# Patient Record
Sex: Female | Born: 1945
Health system: Southern US, Community
[De-identification: ages and names within clinical notes are randomized; demographics above are authoritative.]

## PROBLEM LIST (undated history)

## (undated) ENCOUNTER — Inpatient Hospital Stay: Admission: EM | Payer: Self-pay | Source: Home / Self Care

## (undated) DIAGNOSIS — R112 Nausea with vomiting, unspecified: Secondary | ICD-10-CM

## (undated) DIAGNOSIS — Z9889 Other specified postprocedural states: Secondary | ICD-10-CM

## (undated) DIAGNOSIS — E119 Type 2 diabetes mellitus without complications: Secondary | ICD-10-CM

## (undated) DIAGNOSIS — R5383 Other fatigue: Secondary | ICD-10-CM

## (undated) DIAGNOSIS — L821 Other seborrheic keratosis: Secondary | ICD-10-CM

## (undated) DIAGNOSIS — I2510 Atherosclerotic heart disease of native coronary artery without angina pectoris: Secondary | ICD-10-CM

## (undated) DIAGNOSIS — C4490 Unspecified malignant neoplasm of skin, unspecified: Secondary | ICD-10-CM

## (undated) DIAGNOSIS — T4145XA Adverse effect of unspecified anesthetic, initial encounter: Secondary | ICD-10-CM

## (undated) DIAGNOSIS — K589 Irritable bowel syndrome without diarrhea: Secondary | ICD-10-CM

## (undated) DIAGNOSIS — I10 Essential (primary) hypertension: Secondary | ICD-10-CM

## (undated) DIAGNOSIS — I219 Acute myocardial infarction, unspecified: Secondary | ICD-10-CM

## (undated) DIAGNOSIS — M1990 Unspecified osteoarthritis, unspecified site: Secondary | ICD-10-CM

## (undated) DIAGNOSIS — T8859XA Other complications of anesthesia, initial encounter: Secondary | ICD-10-CM

## (undated) DIAGNOSIS — K219 Gastro-esophageal reflux disease without esophagitis: Secondary | ICD-10-CM

## (undated) DIAGNOSIS — R0789 Other chest pain: Secondary | ICD-10-CM

## (undated) DIAGNOSIS — R609 Edema, unspecified: Secondary | ICD-10-CM

## (undated) DIAGNOSIS — E039 Hypothyroidism, unspecified: Secondary | ICD-10-CM

## (undated) DIAGNOSIS — E785 Hyperlipidemia, unspecified: Secondary | ICD-10-CM

## (undated) HISTORY — DX: Hypothyroidism, unspecified: E03.9

## (undated) HISTORY — PX: OTHER SURGICAL HISTORY: SHX169

## (undated) HISTORY — DX: Hyperlipidemia, unspecified: E78.5

## (undated) HISTORY — DX: Unspecified osteoarthritis, unspecified site: M19.90

## (undated) HISTORY — DX: Unspecified malignant neoplasm of skin, unspecified: C44.90

## (undated) HISTORY — DX: Other seborrheic keratosis: L82.1

## (undated) HISTORY — PX: KNEE ARTHROPLASTY: SHX992

## (undated) HISTORY — DX: Essential (primary) hypertension: I10

## (undated) HISTORY — DX: Type 2 diabetes mellitus without complications: E11.9

## (undated) HISTORY — PX: TUBAL LIGATION: SHX77

## (undated) HISTORY — DX: Other chest pain: R07.89

## (undated) HISTORY — PX: CHOLECYSTECTOMY: SHX55

## (undated) HISTORY — DX: Gastro-esophageal reflux disease without esophagitis: K21.9

## (undated) HISTORY — DX: Irritable bowel syndrome, unspecified: K58.9

## (undated) HISTORY — DX: Atherosclerotic heart disease of native coronary artery without angina pectoris: I25.10

## (undated) HISTORY — DX: Other fatigue: R53.83

## (undated) HISTORY — DX: Other specified postprocedural states: Z98.890

## (undated) HISTORY — DX: Acute myocardial infarction, unspecified: I21.9

## (undated) HISTORY — DX: Edema, unspecified: R60.9

---
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
## 1998-07-17 HISTORY — PX: BREAST EXCISIONAL BIOPSY: SUR124

## 1998-08-24 ENCOUNTER — Other Ambulatory Visit: Admission: RE | Admit: 1998-08-24 | Discharge: 1998-08-24 | Payer: Medicare Other | Admitting: Obstetrics & Gynecology

## 1998-09-02 ENCOUNTER — Encounter: Payer: Self-pay | Admitting: General Surgery

## 1998-09-02 ENCOUNTER — Encounter: Payer: Self-pay | Admitting: Family Medicine

## 1998-09-02 ENCOUNTER — Emergency Department (HOSPITAL_COMMUNITY): Admission: EM | Admit: 1998-09-02 | Discharge: 1998-09-02 | Payer: Self-pay | Admitting: Internal Medicine

## 1998-09-08 ENCOUNTER — Encounter: Payer: Self-pay | Admitting: Obstetrics & Gynecology

## 1998-09-08 ENCOUNTER — Ambulatory Visit (HOSPITAL_COMMUNITY): Admission: RE | Admit: 1998-09-08 | Discharge: 1998-09-08 | Payer: Self-pay | Admitting: Obstetrics & Gynecology

## 1998-09-09 ENCOUNTER — Ambulatory Visit (HOSPITAL_COMMUNITY): Admission: RE | Admit: 1998-09-09 | Discharge: 1998-09-09 | Payer: Self-pay | Admitting: Obstetrics & Gynecology

## 1998-11-01 ENCOUNTER — Ambulatory Visit (HOSPITAL_COMMUNITY): Admission: RE | Admit: 1998-11-01 | Discharge: 1998-11-01 | Payer: Self-pay | Admitting: *Deleted

## 1999-07-06 ENCOUNTER — Encounter (INDEPENDENT_AMBULATORY_CARE_PROVIDER_SITE_OTHER): Payer: Self-pay

## 1999-07-06 ENCOUNTER — Other Ambulatory Visit: Admission: RE | Admit: 1999-07-06 | Discharge: 1999-07-06 | Payer: Self-pay | Admitting: Gastroenterology

## 2000-03-02 ENCOUNTER — Other Ambulatory Visit: Admission: RE | Admit: 2000-03-02 | Discharge: 2000-03-02 | Payer: Self-pay | Admitting: Obstetrics & Gynecology

## 2001-03-20 ENCOUNTER — Other Ambulatory Visit: Admission: RE | Admit: 2001-03-20 | Discharge: 2001-03-20 | Payer: Self-pay | Admitting: Obstetrics & Gynecology

## 2002-04-30 ENCOUNTER — Other Ambulatory Visit: Admission: RE | Admit: 2002-04-30 | Discharge: 2002-04-30 | Payer: Self-pay | Admitting: Obstetrics & Gynecology

## 2002-06-14 ENCOUNTER — Emergency Department (HOSPITAL_COMMUNITY): Admission: EM | Admit: 2002-06-14 | Discharge: 2002-06-14 | Payer: Self-pay | Admitting: Emergency Medicine

## 2002-06-14 ENCOUNTER — Encounter: Payer: Self-pay | Admitting: Emergency Medicine

## 2002-07-02 ENCOUNTER — Encounter: Payer: Self-pay | Admitting: Pulmonary Disease

## 2002-07-02 ENCOUNTER — Ambulatory Visit (HOSPITAL_COMMUNITY): Admission: RE | Admit: 2002-07-02 | Discharge: 2002-07-02 | Payer: Self-pay | Admitting: Pulmonary Disease

## 2003-02-06 ENCOUNTER — Encounter: Payer: Self-pay | Admitting: Family Medicine

## 2003-02-06 ENCOUNTER — Other Ambulatory Visit: Admission: RE | Admit: 2003-02-06 | Discharge: 2003-02-06 | Payer: Self-pay | Admitting: Family Medicine

## 2003-02-16 ENCOUNTER — Encounter (INDEPENDENT_AMBULATORY_CARE_PROVIDER_SITE_OTHER): Payer: Self-pay | Admitting: *Deleted

## 2003-03-05 ENCOUNTER — Encounter (INDEPENDENT_AMBULATORY_CARE_PROVIDER_SITE_OTHER): Payer: Self-pay | Admitting: *Deleted

## 2004-02-08 ENCOUNTER — Other Ambulatory Visit: Admission: RE | Admit: 2004-02-08 | Discharge: 2004-02-08 | Payer: Self-pay | Admitting: Family Medicine

## 2004-02-29 ENCOUNTER — Encounter: Admission: RE | Admit: 2004-02-29 | Discharge: 2004-02-29 | Payer: Self-pay | Admitting: Family Medicine

## 2004-05-26 ENCOUNTER — Ambulatory Visit: Payer: Self-pay

## 2004-08-15 ENCOUNTER — Ambulatory Visit: Payer: Self-pay | Admitting: Family Medicine

## 2004-12-01 ENCOUNTER — Ambulatory Visit: Payer: Self-pay | Admitting: Family Medicine

## 2004-12-08 ENCOUNTER — Ambulatory Visit: Payer: Self-pay | Admitting: Family Medicine

## 2004-12-08 ENCOUNTER — Other Ambulatory Visit: Admission: RE | Admit: 2004-12-08 | Discharge: 2004-12-08 | Payer: Self-pay | Admitting: Family Medicine

## 2005-01-06 ENCOUNTER — Ambulatory Visit: Payer: Self-pay | Admitting: Family Medicine

## 2005-01-30 ENCOUNTER — Ambulatory Visit: Payer: Self-pay | Admitting: Family Medicine

## 2005-06-01 ENCOUNTER — Encounter: Admission: RE | Admit: 2005-06-01 | Discharge: 2005-06-01 | Payer: Self-pay | Admitting: Family Medicine

## 2005-06-01 ENCOUNTER — Ambulatory Visit: Payer: Self-pay | Admitting: Cardiology

## 2005-06-22 ENCOUNTER — Encounter: Admission: RE | Admit: 2005-06-22 | Discharge: 2005-06-22 | Payer: Self-pay | Admitting: Family Medicine

## 2005-08-16 ENCOUNTER — Ambulatory Visit: Payer: Self-pay | Admitting: Gastroenterology

## 2005-12-28 ENCOUNTER — Ambulatory Visit: Payer: Self-pay | Admitting: Family Medicine

## 2006-01-30 ENCOUNTER — Ambulatory Visit: Payer: Self-pay | Admitting: Family Medicine

## 2006-01-30 ENCOUNTER — Other Ambulatory Visit: Admission: RE | Admit: 2006-01-30 | Discharge: 2006-01-30 | Payer: Self-pay | Admitting: Family Medicine

## 2006-01-30 ENCOUNTER — Encounter: Payer: Self-pay | Admitting: Family Medicine

## 2006-04-19 ENCOUNTER — Ambulatory Visit: Payer: Self-pay | Admitting: Family Medicine

## 2006-05-03 ENCOUNTER — Ambulatory Visit: Payer: Self-pay | Admitting: Family Medicine

## 2006-06-12 ENCOUNTER — Ambulatory Visit: Payer: Self-pay | Admitting: Cardiology

## 2006-06-15 ENCOUNTER — Ambulatory Visit: Payer: Self-pay

## 2006-07-19 ENCOUNTER — Encounter: Admission: RE | Admit: 2006-07-19 | Discharge: 2006-07-19 | Payer: Self-pay | Admitting: Family Medicine

## 2006-09-05 ENCOUNTER — Ambulatory Visit: Payer: Self-pay | Admitting: Cardiology

## 2006-09-05 ENCOUNTER — Inpatient Hospital Stay (HOSPITAL_COMMUNITY): Admission: EM | Admit: 2006-09-05 | Discharge: 2006-09-06 | Payer: Self-pay | Admitting: Emergency Medicine

## 2006-09-11 ENCOUNTER — Emergency Department (HOSPITAL_COMMUNITY): Admission: EM | Admit: 2006-09-11 | Discharge: 2006-09-11 | Payer: Self-pay | Admitting: Emergency Medicine

## 2006-09-11 ENCOUNTER — Ambulatory Visit: Payer: Self-pay | Admitting: Family Medicine

## 2006-09-13 ENCOUNTER — Ambulatory Visit: Payer: Self-pay | Admitting: *Deleted

## 2006-09-13 LAB — CONVERTED CEMR LAB
Basophils Absolute: 0 10*3/uL (ref 0.0–0.1)
Eosinophils Absolute: 0.1 10*3/uL (ref 0.0–0.6)
MCHC: 34.3 g/dL (ref 30.0–36.0)
MCV: 92.3 fL (ref 78.0–100.0)
Platelets: 344 10*3/uL (ref 150–400)
RBC: 3.61 M/uL — ABNORMAL LOW (ref 3.87–5.11)

## 2006-09-20 ENCOUNTER — Encounter (HOSPITAL_COMMUNITY): Admission: RE | Admit: 2006-09-20 | Discharge: 2006-12-19 | Payer: Self-pay | Admitting: Cardiology

## 2006-09-21 ENCOUNTER — Ambulatory Visit: Payer: Self-pay | Admitting: Cardiology

## 2006-10-24 ENCOUNTER — Ambulatory Visit: Payer: Self-pay | Admitting: Gastroenterology

## 2006-10-24 LAB — CONVERTED CEMR LAB
Basophils Relative: 0.3 % (ref 0.0–1.0)
HCT: 41 % (ref 36.0–46.0)
Hemoglobin: 14.1 g/dL (ref 12.0–15.0)
Monocytes Absolute: 0.4 10*3/uL (ref 0.2–0.7)
Monocytes Relative: 7.5 % (ref 3.0–11.0)
RDW: 12.8 % (ref 11.5–14.6)
Saturation Ratios: 27 % (ref 20.0–50.0)
Transferrin: 243.1 mg/dL (ref >=212.0)
Vitamin B-12: 462 pg/mL (ref 211–911)

## 2006-11-06 ENCOUNTER — Ambulatory Visit: Payer: Self-pay | Admitting: Gastroenterology

## 2006-11-06 LAB — CONVERTED CEMR LAB
Fecal Occult Blood: NEGATIVE
OCCULT 2: NEGATIVE

## 2006-11-16 ENCOUNTER — Ambulatory Visit: Payer: Self-pay | Admitting: Cardiology

## 2006-12-20 ENCOUNTER — Encounter (HOSPITAL_COMMUNITY): Admission: RE | Admit: 2006-12-20 | Discharge: 2007-01-21 | Payer: Self-pay | Admitting: Cardiology

## 2007-01-14 ENCOUNTER — Ambulatory Visit: Payer: Self-pay | Admitting: Internal Medicine

## 2007-01-24 DIAGNOSIS — K219 Gastro-esophageal reflux disease without esophagitis: Secondary | ICD-10-CM

## 2007-01-24 DIAGNOSIS — Z8719 Personal history of other diseases of the digestive system: Secondary | ICD-10-CM

## 2007-01-24 DIAGNOSIS — Z860100 Personal history of colon polyps, unspecified: Secondary | ICD-10-CM | POA: Insufficient documentation

## 2007-01-24 DIAGNOSIS — M1990 Unspecified osteoarthritis, unspecified site: Secondary | ICD-10-CM

## 2007-01-24 DIAGNOSIS — I252 Old myocardial infarction: Secondary | ICD-10-CM

## 2007-01-24 DIAGNOSIS — F329 Major depressive disorder, single episode, unspecified: Secondary | ICD-10-CM

## 2007-02-06 ENCOUNTER — Ambulatory Visit: Payer: Self-pay | Admitting: Family Medicine

## 2007-02-06 LAB — CONVERTED CEMR LAB
ALT: 35 units/L (ref 0–35)
Alkaline Phosphatase: 73 units/L (ref 39–117)
BUN: 11 mg/dL (ref 6–23)
Bilirubin Urine: NEGATIVE
Bilirubin, Direct: 0.1 mg/dL (ref 0.0–0.3)
Blood in Urine, dipstick: NEGATIVE
Calcium: 9.3 mg/dL (ref 8.4–10.5)
Cholesterol: 123 mg/dL (ref 0–200)
Eosinophils Absolute: 0.1 10*3/uL (ref 0.0–0.6)
GFR calc Af Amer: 109 mL/min
GFR calc non Af Amer: 90 mL/min
Glucose, Urine, Semiquant: NEGATIVE
HDL: 37.3 mg/dL — ABNORMAL LOW (ref >39.0)
Hgb A1c MFr Bld: 6.7 % — ABNORMAL HIGH (ref 4.6–6.0)
Ketones, urine, test strip: NEGATIVE
Lymphocytes Relative: 39.3 % (ref 12.0–46.0)
MCHC: 33.8 g/dL (ref 30.0–36.0)
MCV: 89.4 fL (ref 78.0–100.0)
Microalb Creat Ratio: 3.3 mg/g (ref 0.0–30.0)
Microalb, Ur: 0.5 mg/dL (ref 0.0–1.9)
Monocytes Absolute: 0.4 10*3/uL (ref 0.2–0.7)
Monocytes Relative: 7.5 % (ref 3.0–11.0)
Neutro Abs: 2.4 10*3/uL (ref 1.4–7.7)
Nitrite: NEGATIVE
Platelets: 245 10*3/uL (ref 150–400)
Potassium: 3.8 meq/L (ref 3.5–5.1)
Specific Gravity, Urine: 1.025
Triglycerides: 98 mg/dL (ref 0–149)
pH: 5.5

## 2007-02-13 ENCOUNTER — Ambulatory Visit: Payer: Self-pay | Admitting: Family Medicine

## 2007-02-13 ENCOUNTER — Other Ambulatory Visit: Admission: RE | Admit: 2007-02-13 | Discharge: 2007-02-13 | Payer: Self-pay | Admitting: Family Medicine

## 2007-02-13 ENCOUNTER — Encounter: Payer: Self-pay | Admitting: Family Medicine

## 2007-02-14 DIAGNOSIS — Z85828 Personal history of other malignant neoplasm of skin: Secondary | ICD-10-CM | POA: Insufficient documentation

## 2007-02-14 DIAGNOSIS — H8309 Labyrinthitis, unspecified ear: Secondary | ICD-10-CM | POA: Insufficient documentation

## 2007-02-15 ENCOUNTER — Ambulatory Visit: Payer: Self-pay | Admitting: Cardiology

## 2007-04-12 ENCOUNTER — Ambulatory Visit: Payer: Self-pay

## 2007-04-16 ENCOUNTER — Ambulatory Visit: Payer: Self-pay | Admitting: Family Medicine

## 2007-04-16 DIAGNOSIS — L821 Other seborrheic keratosis: Secondary | ICD-10-CM

## 2007-04-23 ENCOUNTER — Ambulatory Visit: Payer: Self-pay | Admitting: Family Medicine

## 2007-04-23 LAB — CONVERTED CEMR LAB
Glucose, Bld: 110 mg/dL — ABNORMAL HIGH (ref 70–99)
Hgb A1c MFr Bld: 6.5 % — ABNORMAL HIGH (ref 4.6–6.0)

## 2007-04-26 ENCOUNTER — Ambulatory Visit: Payer: Self-pay | Admitting: Family Medicine

## 2007-07-23 ENCOUNTER — Encounter: Admission: RE | Admit: 2007-07-23 | Discharge: 2007-07-23 | Payer: Self-pay | Admitting: Family Medicine

## 2007-09-12 ENCOUNTER — Ambulatory Visit: Payer: Self-pay | Admitting: Cardiology

## 2007-11-15 ENCOUNTER — Ambulatory Visit: Payer: Self-pay | Admitting: Family Medicine

## 2007-11-15 DIAGNOSIS — J069 Acute upper respiratory infection, unspecified: Secondary | ICD-10-CM | POA: Insufficient documentation

## 2007-12-03 ENCOUNTER — Telehealth: Payer: Self-pay | Admitting: Family Medicine

## 2008-02-04 ENCOUNTER — Ambulatory Visit: Payer: Self-pay | Admitting: Family Medicine

## 2008-02-04 LAB — CONVERTED CEMR LAB
AST: 24 units/L (ref 0–37)
Albumin: 3.9 g/dL (ref 3.5–5.2)
Alkaline Phosphatase: 92 units/L (ref 39–117)
BUN: 12 mg/dL (ref 6–23)
Bilirubin, Direct: 0.1 mg/dL (ref 0.0–0.3)
Blood in Urine, dipstick: NEGATIVE
CO2: 27 meq/L (ref 19–32)
Chloride: 100 meq/L (ref 96–112)
Eosinophils Relative: 2.5 % (ref 0.0–5.0)
GFR calc Af Amer: 93 mL/min
Glucose, Bld: 123 mg/dL — ABNORMAL HIGH (ref 70–99)
HCT: 39.4 % (ref 36.0–46.0)
HDL: 45.4 mg/dL (ref >39.0)
Ketones, urine, test strip: NEGATIVE
LDL Cholesterol: 88 mg/dL (ref 0–99)
Lymphocytes Relative: 35 % (ref 12.0–46.0)
Monocytes Absolute: 0.4 10*3/uL (ref 0.1–1.0)
Monocytes Relative: 7.5 % (ref 3.0–12.0)
Neutrophils Relative %: 54.8 % (ref 43.0–77.0)
Nitrite: NEGATIVE
Platelets: 330 10*3/uL (ref 150–400)
Potassium: 3.9 meq/L (ref 3.5–5.1)
RDW: 12.8 % (ref 11.5–14.6)
Sodium: 136 meq/L (ref 135–145)
Specific Gravity, Urine: 1.01
Total CHOL/HDL Ratio: 3.5
Total Protein: 7 g/dL (ref 6.0–8.3)
Triglycerides: 127 mg/dL (ref 0–149)
Urobilinogen, UA: 0.2
VLDL: 25 mg/dL (ref 0–40)
WBC Urine, dipstick: NEGATIVE
WBC: 5.8 10*3/uL (ref 4.5–10.5)

## 2008-02-17 ENCOUNTER — Ambulatory Visit: Payer: Self-pay | Admitting: Family Medicine

## 2008-02-17 ENCOUNTER — Encounter: Payer: Self-pay | Admitting: Family Medicine

## 2008-02-17 ENCOUNTER — Other Ambulatory Visit: Admission: RE | Admit: 2008-02-17 | Discharge: 2008-02-17 | Payer: Self-pay | Admitting: Family Medicine

## 2008-02-18 ENCOUNTER — Telehealth: Payer: Self-pay | Admitting: Family Medicine

## 2008-02-25 ENCOUNTER — Ambulatory Visit: Payer: Self-pay | Admitting: Family Medicine

## 2008-04-10 ENCOUNTER — Telehealth: Payer: Self-pay | Admitting: *Deleted

## 2008-04-17 ENCOUNTER — Telehealth: Payer: Self-pay | Admitting: *Deleted

## 2008-06-02 ENCOUNTER — Telehealth: Payer: Self-pay | Admitting: *Deleted

## 2008-07-31 ENCOUNTER — Telehealth: Payer: Self-pay | Admitting: *Deleted

## 2008-08-03 ENCOUNTER — Encounter: Admission: RE | Admit: 2008-08-03 | Discharge: 2008-08-03 | Payer: Self-pay | Admitting: Family Medicine

## 2008-08-03 ENCOUNTER — Ambulatory Visit: Payer: Self-pay | Admitting: Cardiology

## 2008-12-03 ENCOUNTER — Telehealth: Payer: Self-pay | Admitting: Family Medicine

## 2009-02-10 ENCOUNTER — Ambulatory Visit: Payer: Self-pay | Admitting: Family Medicine

## 2009-02-10 DIAGNOSIS — J45909 Unspecified asthma, uncomplicated: Secondary | ICD-10-CM | POA: Insufficient documentation

## 2009-02-15 ENCOUNTER — Ambulatory Visit: Payer: Self-pay | Admitting: Family Medicine

## 2009-02-15 LAB — CONVERTED CEMR LAB
ALT: 51 units/L — ABNORMAL HIGH (ref 0–35)
AST: 26 units/L (ref 0–37)
Alkaline Phosphatase: 87 units/L (ref 39–117)
BUN: 24 mg/dL — ABNORMAL HIGH (ref 6–23)
Bilirubin, Direct: 0 mg/dL (ref 0.0–0.3)
Cholesterol: 176 mg/dL (ref 0–200)
Creatinine, Ser: 1 mg/dL (ref 0.4–1.2)
Eosinophils Relative: 0.8 % (ref 0.0–5.0)
GFR calc non Af Amer: 59.17 mL/min (ref >60)
LDL Cholesterol: 78 mg/dL (ref 0–99)
Monocytes Relative: 5.7 % (ref 3.0–12.0)
Neutrophils Relative %: 49.1 % (ref 43.0–77.0)
Nitrite: NEGATIVE
Platelets: 313 10*3/uL (ref 150.0–400.0)
Potassium: 4.5 meq/L (ref 3.5–5.1)
RBC: 4.9 M/uL (ref 3.87–5.11)
Total Bilirubin: 0.7 mg/dL (ref 0.3–1.2)
Total CHOL/HDL Ratio: 3
Triglycerides: 156 mg/dL — ABNORMAL HIGH (ref 0.0–149.0)
Urobilinogen, UA: 0.2
VLDL: 31.2 mg/dL (ref 0.0–40.0)
WBC: 9.6 10*3/uL (ref 4.5–10.5)

## 2009-02-23 ENCOUNTER — Encounter: Payer: Self-pay | Admitting: Family Medicine

## 2009-02-23 ENCOUNTER — Ambulatory Visit: Payer: Self-pay | Admitting: Family Medicine

## 2009-02-23 ENCOUNTER — Other Ambulatory Visit: Admission: RE | Admit: 2009-02-23 | Discharge: 2009-02-23 | Payer: Self-pay | Admitting: Family Medicine

## 2009-02-23 ENCOUNTER — Ambulatory Visit: Payer: Self-pay | Admitting: Internal Medicine

## 2009-02-23 DIAGNOSIS — E039 Hypothyroidism, unspecified: Secondary | ICD-10-CM

## 2009-02-24 ENCOUNTER — Telehealth: Payer: Self-pay | Admitting: Family Medicine

## 2009-03-10 ENCOUNTER — Encounter (INDEPENDENT_AMBULATORY_CARE_PROVIDER_SITE_OTHER): Payer: Self-pay | Admitting: *Deleted

## 2009-03-19 ENCOUNTER — Observation Stay (HOSPITAL_COMMUNITY): Admission: EM | Admit: 2009-03-19 | Discharge: 2009-03-19 | Payer: Self-pay | Admitting: Emergency Medicine

## 2009-03-19 ENCOUNTER — Ambulatory Visit: Payer: Self-pay | Admitting: Cardiovascular Disease

## 2009-04-07 ENCOUNTER — Ambulatory Visit: Payer: Self-pay | Admitting: Family Medicine

## 2009-04-09 LAB — CONVERTED CEMR LAB: TSH: 1.25 microintl units/mL (ref 0.35–5.50)

## 2009-04-13 ENCOUNTER — Ambulatory Visit: Payer: Self-pay | Admitting: Family Medicine

## 2009-04-22 ENCOUNTER — Encounter (INDEPENDENT_AMBULATORY_CARE_PROVIDER_SITE_OTHER): Payer: Self-pay | Admitting: *Deleted

## 2009-05-19 ENCOUNTER — Ambulatory Visit: Payer: Self-pay | Admitting: Cardiology

## 2009-05-19 DIAGNOSIS — R609 Edema, unspecified: Secondary | ICD-10-CM | POA: Insufficient documentation

## 2009-08-02 ENCOUNTER — Ambulatory Visit: Payer: Self-pay | Admitting: Family Medicine

## 2009-08-02 DIAGNOSIS — J45901 Unspecified asthma with (acute) exacerbation: Secondary | ICD-10-CM | POA: Insufficient documentation

## 2009-08-23 ENCOUNTER — Ambulatory Visit: Payer: Self-pay | Admitting: Family Medicine

## 2009-08-23 DIAGNOSIS — R5381 Other malaise: Secondary | ICD-10-CM

## 2009-08-23 DIAGNOSIS — R0789 Other chest pain: Secondary | ICD-10-CM

## 2009-08-23 DIAGNOSIS — R5383 Other fatigue: Secondary | ICD-10-CM

## 2009-08-24 LAB — CONVERTED CEMR LAB
ALT: 35 units/L (ref 0–35)
BUN: 9 mg/dL (ref 6–23)
CO2: 29 meq/L (ref 19–32)
Chloride: 105 meq/L (ref 96–112)
Creatinine, Ser: 0.8 mg/dL (ref 0.4–1.2)
Eosinophils Relative: 6.1 % — ABNORMAL HIGH (ref 0.0–5.0)
Glucose, Bld: 129 mg/dL — ABNORMAL HIGH (ref 70–99)
HCT: 39 % (ref 36.0–46.0)
Lymphs Abs: 1.7 10*3/uL (ref 0.7–4.0)
MCHC: 33.3 g/dL (ref 30.0–36.0)
MCV: 89.5 fL (ref 78.0–100.0)
Monocytes Absolute: 0.5 10*3/uL (ref 0.1–1.0)
Platelets: 243 10*3/uL (ref 150.0–400.0)
Total Bilirubin: 0.5 mg/dL (ref 0.3–1.2)
Total Protein: 6.4 g/dL (ref 6.0–8.3)
WBC: 5.7 10*3/uL (ref 4.5–10.5)

## 2009-08-26 ENCOUNTER — Ambulatory Visit: Payer: Self-pay | Admitting: Family Medicine

## 2009-08-26 ENCOUNTER — Encounter (INDEPENDENT_AMBULATORY_CARE_PROVIDER_SITE_OTHER): Payer: Self-pay | Admitting: *Deleted

## 2009-08-27 ENCOUNTER — Telehealth: Payer: Self-pay | Admitting: Internal Medicine

## 2009-08-30 ENCOUNTER — Ambulatory Visit: Payer: Self-pay | Admitting: Internal Medicine

## 2009-08-31 ENCOUNTER — Ambulatory Visit: Payer: Self-pay | Admitting: Internal Medicine

## 2009-08-31 LAB — CONVERTED CEMR LAB: UREASE: NEGATIVE

## 2009-09-06 ENCOUNTER — Encounter: Admission: RE | Admit: 2009-09-06 | Discharge: 2009-09-06 | Payer: Self-pay | Admitting: Family Medicine

## 2009-09-17 ENCOUNTER — Encounter: Payer: Self-pay | Admitting: Family Medicine

## 2009-09-22 DIAGNOSIS — E785 Hyperlipidemia, unspecified: Secondary | ICD-10-CM

## 2009-09-22 DIAGNOSIS — I10 Essential (primary) hypertension: Secondary | ICD-10-CM | POA: Insufficient documentation

## 2009-09-22 DIAGNOSIS — I2510 Atherosclerotic heart disease of native coronary artery without angina pectoris: Secondary | ICD-10-CM

## 2009-09-23 ENCOUNTER — Ambulatory Visit: Payer: Self-pay | Admitting: Cardiology

## 2009-10-21 ENCOUNTER — Ambulatory Visit: Payer: Self-pay | Admitting: Family Medicine

## 2009-10-21 DIAGNOSIS — J029 Acute pharyngitis, unspecified: Secondary | ICD-10-CM

## 2009-10-21 DIAGNOSIS — J1189 Influenza due to unidentified influenza virus with other manifestations: Secondary | ICD-10-CM | POA: Insufficient documentation

## 2009-10-26 ENCOUNTER — Telehealth: Payer: Self-pay | Admitting: Family Medicine

## 2009-11-22 ENCOUNTER — Ambulatory Visit: Payer: Self-pay | Admitting: Family Medicine

## 2009-11-30 ENCOUNTER — Telehealth: Payer: Self-pay | Admitting: Family Medicine

## 2009-12-17 ENCOUNTER — Telehealth: Payer: Self-pay | Admitting: *Deleted

## 2009-12-20 ENCOUNTER — Ambulatory Visit: Payer: Self-pay | Admitting: Family Medicine

## 2009-12-22 ENCOUNTER — Encounter: Payer: Self-pay | Admitting: Family Medicine

## 2010-01-28 ENCOUNTER — Telehealth: Payer: Self-pay | Admitting: Family Medicine

## 2010-02-08 ENCOUNTER — Telehealth: Payer: Self-pay | Admitting: Family Medicine

## 2010-02-28 ENCOUNTER — Inpatient Hospital Stay (HOSPITAL_COMMUNITY)
Admission: RE | Admit: 2010-02-28 | Discharge: 2010-03-03 | Payer: Self-pay | Source: Home / Self Care | Admitting: Orthopedic Surgery

## 2010-03-22 ENCOUNTER — Encounter: Admission: RE | Admit: 2010-03-22 | Discharge: 2010-06-07 | Payer: Self-pay | Admitting: Orthopedic Surgery

## 2010-04-04 ENCOUNTER — Ambulatory Visit: Payer: Self-pay | Admitting: Cardiology

## 2010-04-05 ENCOUNTER — Ambulatory Visit: Payer: Self-pay | Admitting: Family Medicine

## 2010-04-05 ENCOUNTER — Other Ambulatory Visit: Admission: RE | Admit: 2010-04-05 | Discharge: 2010-04-05 | Payer: Self-pay | Admitting: Family Medicine

## 2010-04-05 LAB — CONVERTED CEMR LAB
Blood in Urine, dipstick: NEGATIVE
Glucose, Urine, Semiquant: NEGATIVE
Specific Gravity, Urine: <1.005
WBC Urine, dipstick: NEGATIVE
pH: 5.5

## 2010-04-08 LAB — CONVERTED CEMR LAB
AST: 21 units/L (ref 0–37)
Albumin: 4 g/dL (ref 3.5–5.2)
Basophils Absolute: 0 10*3/uL (ref 0.0–0.1)
CO2: 25 meq/L (ref 19–32)
Chloride: 98 meq/L (ref 96–112)
Eosinophils Absolute: 0 10*3/uL (ref 0.0–0.7)
Glucose, Bld: 106 mg/dL — ABNORMAL HIGH (ref 70–99)
HCT: 35.8 % — ABNORMAL LOW (ref 36.0–46.0)
Hemoglobin: 12.3 g/dL (ref 12.0–15.0)
Lymphs Abs: 1.8 10*3/uL (ref 0.7–4.0)
MCHC: 34.3 g/dL (ref 30.0–36.0)
Monocytes Relative: 6.1 % (ref 3.0–12.0)
Neutro Abs: 4.2 10*3/uL (ref 1.4–7.7)
Potassium: 4.2 meq/L (ref 3.5–5.1)
RDW: 13.8 % (ref 11.5–14.6)
Sodium: 132 meq/L — ABNORMAL LOW (ref 135–145)
TSH: 1.33 microintl units/mL (ref 0.35–5.50)
Total Protein: 6.3 g/dL (ref 6.0–8.3)

## 2010-04-11 ENCOUNTER — Ambulatory Visit: Payer: Self-pay | Admitting: Family Medicine

## 2010-04-11 DIAGNOSIS — IMO0001 Reserved for inherently not codable concepts without codable children: Secondary | ICD-10-CM | POA: Insufficient documentation

## 2010-04-11 LAB — CONVERTED CEMR LAB
ALT: 30 units/L (ref 0–35)
AST: 26 units/L (ref 0–37)
Alkaline Phosphatase: 101 units/L (ref 39–117)
BUN: 14 mg/dL (ref 6–23)
Basophils Absolute: 0 10*3/uL (ref 0.0–0.1)
Bilirubin, Direct: 0.1 mg/dL (ref 0.0–0.3)
Calcium: 10.5 mg/dL (ref 8.4–10.5)
Eosinophils Relative: 0.8 % (ref 0.0–5.0)
GFR calc non Af Amer: 61.07 mL/min (ref >60)
HCT: 42.8 % (ref 36.0–46.0)
Lymphocytes Relative: 36.5 % (ref 12.0–46.0)
Lymphs Abs: 3.4 10*3/uL (ref 0.7–4.0)
Monocytes Relative: 5 % (ref 3.0–12.0)
Neutrophils Relative %: 57.2 % (ref 43.0–77.0)
Platelets: 379 10*3/uL (ref 150.0–400.0)
Potassium: 4.1 meq/L (ref 3.5–5.1)
Total Bilirubin: 0.6 mg/dL (ref 0.3–1.2)
WBC: 9.3 10*3/uL (ref 4.5–10.5)

## 2010-04-12 ENCOUNTER — Telehealth: Payer: Self-pay | Admitting: Internal Medicine

## 2010-04-13 ENCOUNTER — Ambulatory Visit: Payer: Self-pay | Admitting: Internal Medicine

## 2010-04-13 DIAGNOSIS — R110 Nausea: Secondary | ICD-10-CM

## 2010-06-15 ENCOUNTER — Telehealth: Payer: Self-pay | Admitting: Family Medicine

## 2010-06-15 ENCOUNTER — Encounter: Payer: Self-pay | Admitting: Cardiology

## 2010-08-02 ENCOUNTER — Telehealth (INDEPENDENT_AMBULATORY_CARE_PROVIDER_SITE_OTHER): Payer: Self-pay | Admitting: *Deleted

## 2010-08-03 LAB — CBC
HCT: 40.7 % (ref 36.0–46.0)
Hemoglobin: 13.4 g/dL (ref 12.0–15.0)
MCH: 29.5 pg (ref 26.0–34.0)
MCHC: 32.9 g/dL (ref 30.0–36.0)
MCV: 89.5 fL (ref 78.0–100.0)
Platelets: 271 10*3/uL (ref 150–400)
RBC: 4.55 MIL/uL (ref 3.87–5.11)
RDW: 12.8 % (ref 11.5–15.5)
WBC: 5.4 10*3/uL (ref 4.0–10.5)

## 2010-08-03 LAB — URINALYSIS, ROUTINE W REFLEX MICROSCOPIC
Bilirubin Urine: NEGATIVE
Hgb urine dipstick: NEGATIVE
Ketones, ur: NEGATIVE mg/dL
Nitrite: NEGATIVE
Protein, ur: NEGATIVE mg/dL
Specific Gravity, Urine: 1.008 (ref 1.005–1.030)
Urine Glucose, Fasting: NEGATIVE mg/dL
Urobilinogen, UA: 0.2 mg/dL (ref 0.0–1.0)
pH: 5.5 (ref 5.0–8.0)

## 2010-08-03 LAB — COMPREHENSIVE METABOLIC PANEL
ALT: 36 U/L — ABNORMAL HIGH (ref 0–35)
AST: 24 U/L (ref 0–37)
Albumin: 4.2 g/dL (ref 3.5–5.2)
Alkaline Phosphatase: 108 U/L (ref 39–117)
BUN: 18 mg/dL (ref 6–23)
CO2: 28 mEq/L (ref 19–32)
Calcium: 10.2 mg/dL (ref 8.4–10.5)
Chloride: 102 mEq/L (ref 96–112)
Creatinine, Ser: 0.8 mg/dL (ref 0.4–1.2)
GFR calc Af Amer: >60 mL/min (ref >60)
GFR calc non Af Amer: >60 mL/min (ref >60)
Glucose, Bld: 99 mg/dL (ref 70–99)
Potassium: 4.3 mEq/L (ref 3.5–5.1)
Sodium: 137 mEq/L (ref 135–145)
Total Bilirubin: 0.5 mg/dL (ref 0.3–1.2)
Total Protein: 7 g/dL (ref 6.0–8.3)

## 2010-08-03 LAB — SURGICAL PCR SCREEN
MRSA, PCR: NEGATIVE
Staphylococcus aureus: NEGATIVE

## 2010-08-03 LAB — PROTIME-INR
INR: 0.85 (ref 0.00–1.49)
Prothrombin Time: 11.8 seconds (ref 11.6–15.2)

## 2010-08-03 LAB — APTT: aPTT: 32 seconds (ref 24–37)

## 2010-08-07 ENCOUNTER — Encounter: Payer: Self-pay | Admitting: Family Medicine

## 2010-08-08 ENCOUNTER — Inpatient Hospital Stay (HOSPITAL_COMMUNITY)
Admission: RE | Admit: 2010-08-08 | Discharge: 2010-08-11 | Payer: Self-pay | Source: Home / Self Care | Attending: Orthopedic Surgery | Admitting: Orthopedic Surgery

## 2010-08-09 LAB — TYPE AND SCREEN
ABO/RH(D): A NEG
Antibody Screen: NEGATIVE

## 2010-08-10 LAB — PROTIME-INR
INR: 1.03 (ref 0.00–1.49)
Prothrombin Time: 13.7 seconds (ref 11.6–15.2)

## 2010-08-10 LAB — BASIC METABOLIC PANEL
BUN: 8 mg/dL (ref 6–23)
CO2: 23 mEq/L (ref 19–32)
CO2: 30 mEq/L (ref 19–32)
Calcium: 9.1 mg/dL (ref 8.4–10.5)
Calcium: 9 mg/dL (ref 8.4–10.5)
Chloride: 103 mEq/L (ref 96–112)
Creatinine, Ser: 0.73 mg/dL (ref 0.4–1.2)
GFR calc Af Amer: >60 mL/min (ref >60)
GFR calc non Af Amer: >60 mL/min (ref >60)
Glucose, Bld: 210 mg/dL — ABNORMAL HIGH (ref 70–99)
Glucose, Bld: 152 mg/dL — ABNORMAL HIGH (ref 70–99)
Potassium: 4.4 mEq/L (ref 3.5–5.1)
Potassium: 4.4 mEq/L (ref 3.5–5.1)
Sodium: 133 mEq/L — ABNORMAL LOW (ref 135–145)
Sodium: 141 mEq/L (ref 135–145)

## 2010-08-10 LAB — CBC
HCT: 30.7 % — ABNORMAL LOW (ref 36.0–46.0)
HCT: 29 % — ABNORMAL LOW (ref 36.0–46.0)
Hemoglobin: 10.3 g/dL — ABNORMAL LOW (ref 12.0–15.0)
Hemoglobin: 9.6 g/dL — ABNORMAL LOW (ref 12.0–15.0)
MCH: 29.3 pg (ref 26.0–34.0)
MCH: 29.3 pg (ref 26.0–34.0)
MCHC: 33.6 g/dL (ref 30.0–36.0)
MCHC: 33.1 g/dL (ref 30.0–36.0)
MCV: 87.5 fL (ref 78.0–100.0)
Platelets: 219 10*3/uL (ref 150–400)
RBC: 3.51 MIL/uL — ABNORMAL LOW (ref 3.87–5.11)
RDW: 12.7 % (ref 11.5–15.5)
WBC: 10.6 10*3/uL — ABNORMAL HIGH (ref 4.0–10.5)

## 2010-08-11 LAB — CBC
MCH: 29.5 pg (ref 26.0–34.0)
MCV: 88.7 fL (ref 78.0–100.0)
Platelets: 236 10*3/uL (ref 150–400)
RDW: 13.1 % (ref 11.5–15.5)

## 2010-08-16 NOTE — Assessment & Plan Note (Signed)
Summary: Consult-diarrhea, weight loss, nausea, abd pain   History of Present Illness Visit Type: consult  Primary GI MD: Yancey Flemings MD Primary Provider: Roderick Pee MD Requesting Provider: Roderick Pee MD Chief Complaint: Epigastric pain, diarrhea, nausea, and weight loss  History of Present Illness:   65 year old female with multiple medical problems including coronary artery disease, prior myocardial infarction, hyperlipidemia, hypertension, hypothyroidism, skin cancer, GERD, non-adenomatous colon polyps, and asthma. She presents today with an acute diarrheal illness. She was in her usual state of good health until 4 days ago when she developed abrupt diarrhea. She reports moving her bowels 20-30 times per day. Occasionally up at night. Significant nausea with decreased appetite and associated weight loss. No vomiting, fever, or bleeding. Associated abdominal cramping improved after defecation. She has been using Imodium moderately with gradual improvement and diarrhea. States that today is her best day. She does report having taken antibiotics about 6 weeks ago with her knee surgery. No association with persons having similar symptoms. No exotic travel. She did have complete colonoscopy in February 2011. This was unremarkable. Review of outside laboratories from 2 days ago, also unremarkable with normal CBC and erythrocyte sedimentation rate. Normal liver function tests. Normal electrolytes and renal function.   GI Review of Systems    Reports abdominal pain, heartburn, nausea, and  weight loss.     Location of  Abdominal pain: epigastric area. Weight loss of 8  pounds over one week .   Denies acid reflux, belching, bloating, chest pain, dysphagia with liquids, dysphagia with solids, loss of appetite, vomiting, vomiting blood, and  weight gain.      Reports diarrhea.     Denies anal fissure, black tarry stools, change in bowel habit, constipation, diverticulosis, fecal incontinence, heme  positive stool, hemorrhoids, irritable bowel syndrome, jaundice, light color stool, liver problems, rectal bleeding, and  rectal pain.    Current Medications (verified): 1)  Aspirin Ec 325 Mg Tbec (Aspirin) .... Take One Tablet By Mouth Daily(Hold) 2)  Premarin 0.625 Mg/gm  Crea (Estrogens, Conjugated) .Marland Kitchen.. 1 App. Per [employer]) 3)  Nasonex 50 Mcg/act  Susp (Mometasone Furoate) .... Prn 4)  Prilosec 20 Mg Cpdr (Omeprazole) .... Take One By Mouth Two Times A Day(On Hold) 5)  Nitrostat 0.4 Mg Subl (Nitroglycerin) .Marland Kitchen.. 1 Tablet Under Tongue At Onset of Chest Pain; You May Repeat Every 5 Minutes For Up To 3 Doses. 6)  Vitamin D 2000 Unit Tabs (Cholecalciferol) .Marland Kitchen.. 1 Tab Once Daily(On Hold) 7)  Calcium 600 Mg Tabs (Calcium) .Marland Kitchen.. 1 Tab Two Times A (On Hold) 8)  Systane 0.4-0.3 % Soln (Polyethyl Glycol-Propyl Glycol) .... Use As Directed(On Hold) 9)  Cardizem Cd 180 Mg  Cp24 (Diltiazem Hcl Coated Beads) .... One Daily 10)  Salex 6 % (Cream) Kit (Salicylic Acid-Cleanser) .... Apply To Feet As Needed 11)  Zyrtec Allergy 10 Mg Tabs (Cetirizine Hcl) .... Take One Tab At Bedtime As Needed(On Hold) 12)  Fish Oil 1200 Mg Caps (Omega-3 Fatty Acids) .... Take One Tab Two Times A Day(On Hold) 13)  Synthroid 50 Mcg Tabs (Levothyroxine Sodium) .... Take 1 Tablet By Mouth Every Morning 14)  Furosemide 20 Mg Tabs (Furosemide) .Marland Kitchen.. 1 As Needed  For Swelling 15)  Klor-Con M20 20 Meq Cr-Tabs (Potassium Chloride Crys Cr) .Marland Kitchen.. 1 As Needed With Lasix 16)  Ambien 10 Mg Tabs (Zolpidem Tartrate) .Marland Kitchen.. 1 Tab @ Bedtime As Needed(On Hold) 17)  Lipitor 80 Mg Tabs (Atorvastatin Calcium) .... Take One By Mouth  At Bedtime(On Hold) 18)  Proventil Hfa 108 (90 Base) Mcg/act Aers (Albuterol Sulfate) .... 2 Puffs Two Times A Day(On Hold) 19)  Alprazolam 0.5 Mg Tabs (Alprazolam) .... Take One Tab By Mouth Once Daily(On Hold) 20)  Mobic 15 Mg Tabs (Meloxicam) .... Take One Tab By Mouth Once Daily(On Hold) 21)  Demerol 50 Mg Tabs  (Meperidine Hcl) .... Take 1 Tablet By Mouth Four Times A Day 22)  Zofran 4 Mg Tabs (Ondansetron Hcl) .... Take 1 Tablet By Mouth Four Times A Day 23)  Tylenol 325 Mg Tabs (Acetaminophen) .... As Needed 24)  Imodium A-D 2 Mg Tabs (Loperamide Hcl) .... As Needed  Allergies (verified): 1)  ! * Ivp Dye 2)  ! Sulfa 3)  ! Bentyl 4)  ! Codeine 5)  ! Hydrocodone  Past History:  Past Medical History: Reviewed history from 09/22/2009 and no changes required. MYOCARDIAL INFARCTION, HX OF (ICD-412) CAD, NATIVE VESSEL (ICD-414.01) CHEST PAIN, ATYPICAL (ICD-786.59) HYPERLIPIDEMIA (ICD-272.4) HYPERTENSION (ICD-401.9) FATIGUE (ICD-780.79) EDEMA (ICD-782.3) UNSPECIFIED HYPOTHYROIDISM (ICD-244.9) EXTRINSIC ASTHMA, UNSPECIFIED (ICD-493.00) VIRAL URI (ICD-465.9) KERATOSIS, SEBORRHEIC NEC (ICD-702.19) LABYRINTHITIS NOS (ICD-386.30) SKIN CANCER, HX OF (ICD-V10.83) OSTEOARTHRITIS (ICD-715.90) FAMILY HISTORY OF MELANOMA (ICD-V16.8) IRRITABLE BOWEL SYNDROME, HX OF (ICD-V12.79) COLONIC POLYPS, HX OF (ICD-V12.72) GERD (ICD-530.81) DEPRESSION (ICD-311) ASTHMA NOS W/ACUTE EXACERBATION (MWN-027.25)  Past Surgical History: Reviewed history from 04/01/2010 and no changes required. Cholecystectomy Lumpectomy Tubal ligation-Bilat Cath 09/05/06 Knee Arthroplasty-Total...right..02/28/10.Marland KitchenMarland KitchenOllen Gross, MD  Family History: Family History of Melanoma Family History of Cardiovascular disorder..mother started havin heart attacks @ 73, lived into her 61's. daughter died @ 55 metastatic melanoma Family History of Breast Cancer:sister No FH of Colon Cancer:  Social History: Reviewed history from 08/30/2009 and no changes required. Occupation: Charity fundraiser Married Never Smoked Alcohol use-yes 5 per yea rare Daily Caffeine Use one pre week Illicit Drug Use - no  Review of Systems       The patient complains of arthritis/joint pain, back pain, fatigue, muscle pains/cramps, and sleeping problems.  The patient  denies allergy/sinus, anemia, anxiety-new, blood in urine, breast changes/lumps, change in vision, confusion, cough, coughing up blood, depression-new, fainting, fever, headaches-new, hearing problems, heart murmur, heart rhythm changes, itching, menstrual pain, night sweats, nosebleeds, pregnancy symptoms, shortness of breath, skin rash, sore throat, swelling of feet/legs, swollen lymph glands, thirst - excessive, urination - excessive, urination changes/pain, urine leakage, vision changes, and voice change.    Vital Signs:  Patient profile:   65 year old female Menstrual status:  postmenopausal Height:      58.75 inches Weight:      154 pounds BMI:     31.48 BSA:     1.65 Temp:     98.9 degrees F oral Pulse rate:   74 / minute Pulse rhythm:   irregular BP sitting:   132 / 86  (left arm) Cuff size:   regular  Vitals Entered By: Ok Anis CMA (April 13, 2010 2:22 PM)  Physical Exam  General:  Well developed, well nourished, no acute distress. Head:  Normocephalic and atraumatic. Eyes:  PERRLA, no icterus. Ears:  Normal auditory acuity. Mouth:  No deformity or lesions, dentition normal. Neck:  Supple; no masses or thyromegaly. Lungs:  Clear throughout to auscultation. Heart:  Regular rate and rhythm; no murmurs, rubs,  or bruits. Abdomen:  Soft, nontender and nondistended. No masses, hepatosplenomegaly or hernias noted. Normal bowel sounds. Msk:  Symmetrical with no gross deformities. Obviously if he arthritis and recent right knee replacement surgical scar. Normal posture. Pulses:  Normal pulses noted. Extremities:  No clubbing, cyanosis, edema or deformities noted. Neurologic:  Alert and  oriented x4;   Skin:  Intact without significant lesions or rashes. No jaundice. Psych:  Alert and cooperative. Normal mood and affect.   Impression & Recommendations:  Problem # 1:  DIARRHEA (ICD-787.91) the patient presents today with an acute diarrheal illness of 4 days duration.  Negative laboratories. She looks well. Nothing to suggest inflammatory diarrhea. Given the volume and associated symptoms, suspect viral syndrome. Cannot rule out C. difficile given prior antibiotic exposure, but less likely.  Plan: #1. Empiric trial of metronidazole 250 mg p.o. t.i.d. x7 days #2. Use of Imodium p.r.n. as directed #3. Bratt diet. Advance as tolerated. #4. Check followup p.r.n.  Problem # 2:  NAUSEA (ICD-787.02) nausea related to acute illness. Expect improvement with time. She has Zofran at home. Okay to use Zofran p.r.n. She also has Phenergan suppositories, though this is in practical with diarrhea.  Patient Instructions: 1)  Your prescription has been sent to your pharmacy.  2)  Start a BRAT (bananas, rice, applsauce, toast) diet and gradually increase to a regular diet.  3)  Use Imodium as needed for diarrhea.  4)  Please schedule a follow-up appointment as needed.  5)  Copy sent to : Kelle Darting, MD 6)  The medication list was reviewed and reconciled.  All changed / newly prescribed medications were explained.  A complete medication list was provided to the patient / caregiver. Prescriptions: FLAGYL 250 MG TABS (METRONIDAZOLE) one tablet by mouth three times a day  #21 x 0   Entered by:   Christie Nottingham CMA (AAMA)   Authorized by:   Hilarie Fredrickson MD   Signed by:   Christie Nottingham CMA (AAMA) on 04/13/2010   Method used:   Electronically to        [facility] [address] (retail)       [phone]       [place], [place]  [address]       Ph: [phone]       Fax: [phone]   RxID:   [id number]

## 2010-08-16 NOTE — Assessment & Plan Note (Signed)
Summary: emp---will fast///ccm   Vital Signs:  Patient profile:   65 year old female Menstrual status:  postmenopausal Height:      58.75 inches Weight:      161 pounds Temp:     97.7 degrees F oral BP sitting:   120 / 76  (left arm) Cuff size:   regular  Vitals Entered By: Kern Reap CMA Duncan Dull) (April 05, 2010 9:24 AM) CC: welcome to medicare Is Patient Diabetic? No   Primary Care Provider:  Roderick Pee MD  CC:  welcome to medicare.  History of Present Illness: Rebecca [name] is a 65 year old, married female, nonsmoker, who comes in today for evaluation.  She has a history of postmenopausal vaginal dryness for which he uses Premarin cream weekly.  For allergic rhinitis.  She uses over-the-counter Zyrtec, and steroid nasal spray p.r.n.  For hypertension.  She takes Cardizem 180 mg daily, BP 120/76.  For hypothyroidism.  She takes Synthroid 50 micrograms daily.  She was recently given Lasix by the cardiologist for some fluid retention.  Also potassium supplement.  She is not taking inhaled steroid.  She states her cough is gone.  She takes Prilosec 20 mg b.i.d. for reflux esophagitis and 80 mg of Lipitor nightly for hyperlipidemia.  She recently had a total right knee replacement in August.  She is doing well.  No complications.  Her biggest problem is sleep dysfunction.  She's tried Ambien, which helped for awhile, and then didn't help and, then she's tried alprazolam.  Advised to try the operating room if not working well, then will refer to Dr. Rolly Pancake, [place] for consultation.  She also takes Mobic 15 mg daily for osteoarthritis.  She uses one canister a Proventil over 12 to 14 month period of time for occasional asthma.  She also takes over-the-counter aspirin, calcium, vitamin D.  She gets routine eye care, dental care, BSE monthly, colonoscopy, 2009, normal, tetanus, 2003, Pneumovax 2010, flu shot, and shingles.  Shot today.  Cardiogram August 2011 normal exam  August 2011 normal  Allergies: 1)  ! * Ivp Dye 2)  ! Sulfa 3)  ! Bentyl 4)  ! Codeine 5)  ! Hydrocodone  Past History:  Past medical, surgical, family and social histories (including risk factors) reviewed, and no changes noted (except as noted below).  Past Medical History: Reviewed history from 09/22/2009 and no changes required. MYOCARDIAL INFARCTION, HX OF (ICD-412) CAD, NATIVE VESSEL (ICD-414.01) CHEST PAIN, ATYPICAL (ICD-786.59) HYPERLIPIDEMIA (ICD-272.4) HYPERTENSION (ICD-401.9) FATIGUE (ICD-780.79) EDEMA (ICD-782.3) UNSPECIFIED HYPOTHYROIDISM (ICD-244.9) EXTRINSIC ASTHMA, UNSPECIFIED (ICD-493.00) VIRAL URI (ICD-465.9) KERATOSIS, SEBORRHEIC NEC (ICD-702.19) LABYRINTHITIS NOS (ICD-386.30) SKIN CANCER, HX OF (ICD-V10.83) OSTEOARTHRITIS (ICD-715.90) FAMILY HISTORY OF MELANOMA (ICD-V16.8) IRRITABLE BOWEL SYNDROME, HX OF (ICD-V12.79) COLONIC POLYPS, HX OF (ICD-V12.72) GERD (ICD-530.81) DEPRESSION (ICD-311) ASTHMA NOS W/ACUTE EXACERBATION (ZHY-865.78)  Past Surgical History: Reviewed history from 04/01/2010 and no changes required. Cholecystectomy Lumpectomy Tubal ligation-Bilat Cath 09/05/06 Knee Arthroplasty-Total...right..02/28/10.Marland KitchenMarland KitchenOllen Gross, MD  Family History: Reviewed history from 08/30/2009 and no changes required. Family History of Melanoma Family History of Cardiovascular disorder..mother started havin heart attacks @ 51, lived into her 30's. daughter died @ 94 metastatic melanoma Family History of Breast Cancer:sister  Social History: Reviewed history from 08/30/2009 and no changes required. Occupation: Charity fundraiser Married Never Smoked Alcohol use-yes 5 per yea rare Daily Caffeine Use one pre week Illicit Drug Use - no  Review of Systems      See HPI  Physical Exam  General:  Well-developed,well-nourished,in no acute distress; alert,appropriate and cooperative throughout  examination Head:  Normocephalic and atraumatic without obvious  abnormalities. No apparent alopecia or balding. Eyes:  No corneal or conjunctival inflammation noted. EOMI. Perrla. Funduscopic exam benign, without hemorrhages, exudates or papilledema. Vision grossly normal. Ears:  External ear exam shows no significant lesions or deformities.  Otoscopic examination reveals clear canals, tympanic membranes are intact bilaterally without bulging, retraction, inflammation or discharge. Hearing is grossly normal bilaterally. Nose:  External nasal examination shows no deformity or inflammation. Nasal mucosa are pink and moist without lesions or exudates. Mouth:  Oral mucosa and oropharynx without lesions or exudates.  Teeth in good repair. Neck:  No deformities, masses, or tenderness noted. Chest Wall:  No deformities, masses, or tenderness noted. Breasts:  No mass, nodules, thickening, tenderness, bulging, retraction, inflamation, nipple discharge or skin changes noted.   Lungs:  Normal respiratory effort, chest expands symmetrically. Lungs are clear to auscultation, no crackles or wheezes. Heart:  Normal rate and regular rhythm. S1 and S2 normal without gallop, murmur, click, rub or other extra sounds. Abdomen:  Bowel sounds positive,abdomen soft and non-tender without masses, organomegaly or hernias noted. Rectal:  No external abnormalities noted. Normal sphincter tone. No rectal masses or tenderness. Genitalia:  Pelvic Exam:        External: normal female genitalia without lesions or masses        Vagina: normal without lesions or masses        Cervix: normal without lesions or masses        Adnexa: normal bimanual exam without masses or fullness        Uterus: normal by palpation        Pap smear: performed Msk:  No deformity or scoliosis noted of thoracic or lumbar spine.   Pulses:  R and L carotid,radial,femoral,dorsalis pedis and posterior tibial pulses are full and equal bilaterally Extremities:  No clubbing, cyanosis, edema, or deformity noted with  normal full range of motion of all joints.   Neurologic:  No cranial nerve deficits noted. Station and gait are normal. Plantar reflexes are down-going bilaterally. DTRs are symmetrical throughout. Sensory, motor and coordinative functions appear intact. Skin:  Intact without suspicious lesions or rashes Cervical Nodes:  No lymphadenopathy noted Axillary Nodes:  No palpable lymphadenopathy Inguinal Nodes:  No significant adenopathy Psych:  Cognition and judgment appear intact. Alert and cooperative with normal attention span and concentration. No apparent delusions, illusions, hallucinations   Impression & Recommendations:  Problem # 1:  HYPERLIPIDEMIA (ICD-272.4) Assessment Improved  Her updated medication list for this problem includes:    Lipitor 80 Mg Tabs (Atorvastatin calcium) .Marland Kitchen... Take one by mouth at bedtime  Orders: Venipuncture (16109) TLB-Lipid Panel (80061-LIPID) TLB-BMP (Basic Metabolic Panel-BMET) (80048-METABOL) TLB-CBC Platelet - w/Differential (85025-CBCD) TLB-Hepatic/Liver Function Pnl (80076-HEPATIC) TLB-TSH (Thyroid Stimulating Hormone) (84443-TSH) Welcome to Medicare, Physical (U0454) Specimen Handling (09811)  Problem # 2:  HYPERTENSION (ICD-401.9) Assessment: Improved  Her updated medication list for this problem includes:    Cardizem Cd 180 Mg Cp24 (Diltiazem hcl coated beads) ..... One daily    Furosemide 20 Mg Tabs (Furosemide) .Marland Kitchen... 1 as needed  for swelling  Orders: Venipuncture (91478) TLB-Lipid Panel (80061-LIPID) TLB-BMP (Basic Metabolic Panel-BMET) (80048-METABOL) TLB-CBC Platelet - w/Differential (85025-CBCD) TLB-Hepatic/Liver Function Pnl (80076-HEPATIC) TLB-TSH (Thyroid Stimulating Hormone) (84443-TSH) Welcome to Medicare, Physical (G9562) Urinalysis-dipstick only (Medicare patient) (13086VH) Specimen Handling (84696)  Problem # 3:  UNSPECIFIED HYPOTHYROIDISM (ICD-244.9) Assessment: Improved  Her updated medication list for this  problem includes:    Synthroid 50 Mcg Tabs (Levothyroxine sodium) .Marland KitchenMarland KitchenMarland KitchenMarland Kitchen  Take 1 tablet by mouth every morning  Orders: Venipuncture (56213) TLB-Lipid Panel (80061-LIPID) TLB-BMP (Basic Metabolic Panel-BMET) (80048-METABOL) TLB-CBC Platelet - w/Differential (85025-CBCD) TLB-Hepatic/Liver Function Pnl (80076-HEPATIC) TLB-TSH (Thyroid Stimulating Hormone) (84443-TSH) Welcome to Medicare, Physical (Y8657) Specimen Handling (84696)  Problem # 4:  EXTRINSIC ASTHMA, UNSPECIFIED (ICD-493.00) Assessment: Improved  Her updated medication list for this problem includes:    Proventil Hfa 108 (90 Base) Mcg/act Aers (Albuterol sulfate) .Marland Kitchen... 2 puffs two times a day  Problem # 5:  OSTEOARTHRITIS (ICD-715.90) Assessment: Improved  Her updated medication list for this problem includes:    Aspirin Ec 325 Mg Tbec (Aspirin) .Marland Kitchen... Take one tablet by mouth daily    Vicodin Es 7.5-750 Mg Tabs (Hydrocodone-acetaminophen) ..... Use as directed    Mobic 15 Mg Tabs (Meloxicam) .Marland Kitchen... Take one tab by mouth once daily  Orders: Venipuncture (29528) TLB-Lipid Panel (80061-LIPID) TLB-BMP (Basic Metabolic Panel-BMET) (80048-METABOL) TLB-CBC Platelet - w/Differential (85025-CBCD) TLB-Hepatic/Liver Function Pnl (80076-HEPATIC) TLB-TSH (Thyroid Stimulating Hormone) (84443-TSH) Specimen Handling (41324)  Problem # 6:  GERD (ICD-530.81) Assessment: Improved  Her updated medication list for this problem includes:    Prilosec 20 Mg Cpdr (Omeprazole) .Marland Kitchen... Take one by mouth two times a day  Orders: Venipuncture (40102) TLB-Lipid Panel (80061-LIPID) TLB-BMP (Basic Metabolic Panel-BMET) (80048-METABOL) TLB-CBC Platelet - w/Differential (85025-CBCD) TLB-Hepatic/Liver Function Pnl (80076-HEPATIC) TLB-TSH (Thyroid Stimulating Hormone) (84443-TSH) Welcome to Medicare, Physical (V2536) Specimen Handling (64403)  Complete Medication List: 1)  Aspirin Ec 325 Mg Tbec (Aspirin) .... Take one tablet by mouth daily 2)   Premarin 0.625 Mg/gm Crea (Estrogens, conjugated) .Marland Kitchen.. 1 app. per week 3)  Nasonex 50 Mcg/act Susp (Mometasone furoate) .... Prn 4)  Prilosec 20 Mg Cpdr (Omeprazole) .... Take one by mouth two times a day 5)  Nitrostat 0.4 Mg Subl (Nitroglycerin) .Marland Kitchen.. 1 tablet under tongue at onset of chest pain; you may repeat every 5 minutes for up to 3 doses. 6)  Vitamin D 2000 Unit Tabs (Cholecalciferol) .Marland Kitchen.. 1 tab once daily 7)  Calcium 600 Mg Tabs (Calcium) .Marland Kitchen.. 1 tab two times a day 8)  Systane 0.4-0.3 % Soln (Polyethyl glycol-propyl glycol) .... Use as directed 9)  Cardizem Cd 180 Mg Cp24 (Diltiazem hcl coated beads) .... One daily 10)  Salex 6 % (cream) Kit (Salicylic acid-cleanser) .... Apply to feet as needed 11)  Zyrtec Allergy 10 Mg Tabs (Cetirizine hcl) .... Take one tab at bedtime as needed 12)  Fish Oil 1200 Mg Caps (Omega-3 fatty acids) .... Take one tab two times a day 13)  Synthroid 50 Mcg Tabs (Levothyroxine sodium) .... Take 1 tablet by mouth every morning 14)  Furosemide 20 Mg Tabs (Furosemide) .Marland Kitchen.. 1 as needed  for swelling 15)  Klor-con M20 20 Meq Cr-tabs (Potassium chloride crys cr) .Marland Kitchen.. 1 as needed with lasix 16)  Ambien 10 Mg Tabs (Zolpidem tartrate) .Marland Kitchen.. 1 tab @ bedtime as needed 17)  Lipitor 80 Mg Tabs (Atorvastatin calcium) .... Take one by mouth at bedtime 18)  Vicodin Es 7.5-750 Mg Tabs (Hydrocodone-acetaminophen) .... Use as directed 19)  Proventil Hfa 108 (90 Base) Mcg/act Aers (Albuterol sulfate) .... 2 puffs two times a day 20)  Benzonatate 100 Mg Caps (Benzonatate) .... Take one tab by mouth four times a day 21)  Alprazolam 0.5 Mg Tabs (Alprazolam) .... Take one tab by mouth once daily 22)  Mobic 15 Mg Tabs (Meloxicam) .... Take one tab by mouth once daily  Other Orders: Zoster (Shingles) Vaccine Live [phone]) Admin 1st Vaccine (95638)  Patient  Instructions: 1)  Please schedule a follow-up appointment in 1 year. 2)  It is important that you exercise regularly at least  20 minutes 5 times a week. If you develop chest pain, have severe difficulty breathing, or feel very tired , stop exercising immediately and seek medical attention. 3)  Schedule your mammogram. 4)  Take an Aspirin every day. 5)  If the alprazolam, does not resolve the sleep  dysfunction.  I would recommend a consult with Dr. Rolly Pancake, [place] Prescriptions: MOBIC 15 MG TABS (MELOXICAM) take one tab by mouth once daily  #100 x 3   Entered and Authorized by:   Roderick Pee MD   Signed by:   Roderick Pee MD on 04/05/2010   Method used:   Print then Give to Patient   RxID:   [id number] ALPRAZOLAM 0.5 MG TABS (ALPRAZOLAM) take one tab by mouth once daily  #100 x 3   Entered and Authorized by:   Roderick Pee MD   Signed by:   Roderick Pee MD on 04/05/2010   Method used:   Print then Give to Patient   RxID:   [id number] PROVENTIL HFA 108 (90 BASE) MCG/ACT AERS (ALBUTEROL SULFATE) 2 puffs two times a day  #1 x 1   Entered and Authorized by:   Roderick Pee MD   Signed by:   Roderick Pee MD on 04/05/2010   Method used:   Print then Give to Patient   RxID:   [id number] LIPITOR 80 MG TABS (ATORVASTATIN CALCIUM) take one by mouth at bedtime  #100 x 3   Entered and Authorized by:   Roderick Pee MD   Signed by:   Roderick Pee MD on 04/05/2010   Method used:   Print then Give to Patient   RxID:   [id number] KLOR-CON M20 20 MEQ CR-TABS (POTASSIUM CHLORIDE CRYS CR) 1 AS NEEDED WITH LASIX  #100 x 3   Entered and Authorized by:   Roderick Pee MD   Signed by:   Roderick Pee MD on 04/05/2010   Method used:   Print then Give to Patient   RxID:   [id number] FUROSEMIDE 20 MG TABS (FUROSEMIDE) 1 AS NEEDED  FOR SWELLING  #100 x 3   Entered and Authorized by:   Roderick Pee MD   Signed by:   Roderick Pee MD on 04/05/2010   Method used:   Print then Give to Patient   RxID:   [id number] SYNTHROID 50 MCG TABS (LEVOTHYROXINE SODIUM) Take 1 tablet by mouth  every morning  #100 x 3   Entered and Authorized by:   Roderick Pee MD   Signed by:   Roderick Pee MD on 04/05/2010   Method used:   Print then Give to Patient   RxID:   [id number] CARDIZEM CD 180 MG  CP24 (DILTIAZEM HCL COATED BEADS) one daily  #100 x 3   Entered and Authorized by:   Roderick Pee MD   Signed by:   Roderick Pee MD on 04/05/2010   Method used:   Print then Give to Patient   RxID:   [id number] NITROSTAT 0.4 MG SUBL (NITROGLYCERIN) 1 tablet under tongue at onset of chest pain; you may repeat every 5 minutes for up to 3 doses.  #25 x 1   Entered and Authorized by:   Roderick Pee MD   Signed by:   Roderick Pee MD on 04/05/2010  Method used:   Print then Give to Patient   RxID:   [id number] NASONEX 50 MCG/ACT  SUSP (MOMETASONE FUROATE) prn  #3 x 4   Entered and Authorized by:   Roderick Pee MD   Signed by:   Roderick Pee MD on 04/05/2010   Method used:   Print then Give to Patient   RxID:   [id number] PREMARIN 0.625 MG/GM  CREA (ESTROGENS, CONJUGATED) 1 app. per week  #3 tubes x 4   Entered and Authorized by:   Roderick Pee MD   Signed by:   Roderick Pee MD on 04/05/2010   Method used:   Print then Give to Patient   RxID:   [id number]    Immunizations Administered:  Zostavax # 1:    Vaccine Type: Zostavax    Site: right deltoid    Mfr: Merck    Dose: 0.65    Route: [place]    Given by: Kathrynn Speed CMA    Exp. Date: 03/04/2011    Lot #: [id number]    VIS given: 04/28/05 given April 05, 2010.    Physician counseled: yes    Eye Exam  Visual Fields     OD: normal right     OS: normal left  Laboratory Results   Urine Tests  Date/Time Recieved: April 05, 2010 11:16 AM  Date/Time Reported: April 05, 2010 11:16 AM   Routine Urinalysis   Color: yellow Appearance: Clear Glucose: negative   (Normal Range: Negative) Bilirubin: negative   (Normal Range: Negative) Ketone: negative   (Normal Range:  Negative) Spec. Gravity: <1.005   (Normal Range: 1.003-1.035) Blood: negative   (Normal Range: Negative) pH: 5.5   (Normal Range: 5.0-8.0) Protein: negative   (Normal Range: Negative) Urobilinogen: 0.2   (Normal Range: 0-1) Nitrite: negative   (Normal Range: Negative) Leukocyte Esterace: negative   (Normal Range: Negative)    Comments: Wynona Canes, CMA  April 05, 2010 11:16 AM

## 2010-08-16 NOTE — Assessment & Plan Note (Signed)
Summary: flare up IBS/NJR   Vital Signs:  Patient profile:   65 year old female Menstrual status:  postmenopausal Weight:      155 pounds Temp:     97.5 degrees F oral BP sitting:   140 / 90  (right arm)  Vitals Entered By: Kathrynn Speed CMA (April 11, 2010 3:15 PM) CC: IBS fare up x 3 days, with numbness & tingling x 1 week, src Is Patient Diabetic? No   Primary Care Provider:  Roderick Pee MD  CC:  IBS fare up x 3 days, with numbness & tingling x 1 week, and src.  History of Present Illness: Tytionna is a 65 year old, married female, nonsmoker, who comes in today for evaluation of a set of symptoms that have actually been going on for about two to 3 years.  She describes it as diffuse  muscle pain, numbness, Tingling that over the past week.  Has  got much worse.  She feels, like she's going to crawl out of her skin.  It's always worse at night.  Six weeks ago.  She had her right knee replaced.  The surgery went well.  No side effects.  No chest pain no shortness of breath, etc..  Recently, she stopped all her medications, except Imodium, as she's had diarrhea for 3 days, diltiazem, and Mylanta.  Review of systems otherwise negative  Preventive Screening-Counseling & Management  Alcohol-Tobacco     Smoking Status: never  Current Medications (verified): 1)  Aspirin Ec 325 Mg Tbec (Aspirin) .... Take One Tablet By Mouth Daily 2)  Premarin 0.625 Mg/gm  Crea (Estrogens, Conjugated) .Marland Kitchen.. 1 App. Per Week 3)  Nasonex 50 Mcg/act  Susp (Mometasone Furoate) .... Prn 4)  Prilosec 20 Mg Cpdr (Omeprazole) .... Take One By Mouth Two Times A Day 5)  Nitrostat 0.4 Mg Subl (Nitroglycerin) .Marland Kitchen.. 1 Tablet Under Tongue At Onset of Chest Pain; You May Repeat Every 5 Minutes For Up To 3 Doses. 6)  Vitamin D 2000 Unit Tabs (Cholecalciferol) .Marland Kitchen.. 1 Tab Once Daily 7)  Calcium 600 Mg Tabs (Calcium) .Marland Kitchen.. 1 Tab Two Times A Day 8)  Systane 0.4-0.3 % Soln (Polyethyl Glycol-Propyl Glycol) .... Use As  Directed 9)  Cardizem Cd 180 Mg  Cp24 (Diltiazem Hcl Coated Beads) .... One Daily 10)  Salex 6 % (Cream) Kit (Salicylic Acid-Cleanser) .... Apply To Feet As Needed 11)  Zyrtec Allergy 10 Mg Tabs (Cetirizine Hcl) .... Take One Tab At Bedtime As Needed 12)  Fish Oil 1200 Mg Caps (Omega-3 Fatty Acids) .... Take One Tab Two Times A Day 13)  Synthroid 50 Mcg Tabs (Levothyroxine Sodium) .... Take 1 Tablet By Mouth Every Morning 14)  Furosemide 20 Mg Tabs (Furosemide) .Marland Kitchen.. 1 As Needed  For Swelling 15)  Klor-Con M20 20 Meq Cr-Tabs (Potassium Chloride Crys Cr) .Marland Kitchen.. 1 As Needed With Lasix 16)  Ambien 10 Mg Tabs (Zolpidem Tartrate) .Marland Kitchen.. 1 Tab @ Bedtime As Needed 17)  Lipitor 80 Mg Tabs (Atorvastatin Calcium) .... Take One By Mouth At Bedtime 18)  Vicodin Es 7.5-750 Mg Tabs (Hydrocodone-Acetaminophen) .... Use As Directed 19)  Proventil Hfa 108 (90 Base) Mcg/act Aers (Albuterol Sulfate) .... 2 Puffs Two Times A Day 20)  Benzonatate 100 Mg Caps (Benzonatate) .... Take One Tab By Mouth Four Times A Day 21)  Alprazolam 0.5 Mg Tabs (Alprazolam) .... Take One Tab By Mouth Once Daily 22)  Mobic 15 Mg Tabs (Meloxicam) .... Take One Tab By Mouth Once Daily  Allergies (  verified): 1)  ! * Ivp Dye 2)  ! Sulfa 3)  ! Bentyl 4)  ! Codeine 5)  ! Hydrocodone  Past History:  Past medical, surgical, family and social histories (including risk factors) reviewed, and no changes noted (except as noted below).  Past Medical History: Reviewed history from 09/22/2009 and no changes required. MYOCARDIAL INFARCTION, HX OF (ICD-412) CAD, NATIVE VESSEL (ICD-414.01) CHEST PAIN, ATYPICAL (ICD-786.59) HYPERLIPIDEMIA (ICD-272.4) HYPERTENSION (ICD-401.9) FATIGUE (ICD-780.79) EDEMA (ICD-782.3) UNSPECIFIED HYPOTHYROIDISM (ICD-244.9) EXTRINSIC ASTHMA, UNSPECIFIED (ICD-493.00) VIRAL URI (ICD-465.9) KERATOSIS, SEBORRHEIC NEC (ICD-702.19) LABYRINTHITIS NOS (ICD-386.30) SKIN CANCER, HX OF (ICD-V10.83) OSTEOARTHRITIS  (ICD-715.90) FAMILY HISTORY OF MELANOMA (ICD-V16.8) IRRITABLE BOWEL SYNDROME, HX OF (ICD-V12.79) COLONIC POLYPS, HX OF (ICD-V12.72) GERD (ICD-530.81) DEPRESSION (ICD-311) ASTHMA NOS W/ACUTE EXACERBATION (WUJ-811.91)  Past Surgical History: Reviewed history from 04/01/2010 and no changes required. Cholecystectomy Lumpectomy Tubal ligation-Bilat Cath 09/05/06 Knee Arthroplasty-Total...right..02/28/10.Marland KitchenMarland KitchenOllen Gross, MD  Family History: Reviewed history from 08/30/2009 and no changes required. Family History of Melanoma Family History of Cardiovascular disorder..mother started havin heart attacks @ 39, lived into her 78's. daughter died @ 69 metastatic melanoma Family History of Breast Cancer:sister  Social History: Reviewed history from 08/30/2009 and no changes required. Occupation: Charity fundraiser Married Never Smoked Alcohol use-yes 5 per yea rare Daily Caffeine Use one pre week Illicit Drug Use - no  Review of Systems      See HPI  Physical Exam  General:  Well-developed,well-nourished,in no acute distress; alert,appropriate and cooperative throughout examination Lungs:  Normal respiratory effort, chest expands symmetrically. Lungs are clear to auscultation, no crackles or wheezes. Heart:  Normal rate and regular rhythm. S1 and S2 normal without gallop, murmur, click, rub or other extra sounds. Abdomen:  Bowel sounds positive,abdomen soft and non-tender without masses, organomegaly or hernias noted. Msk:  No deformity or scoliosis noted of thoracic or lumbar spine.   Pulses:  R and L carotid,radial,femoral,dorsalis pedis and posterior tibial pulses are full and equal bilaterally Extremities:  No clubbing, cyanosis, edema, or deformity noted with normal full range of motion of all joints.   Neurologic:  No cranial nerve deficits noted. Station and gait are normal. Plantar reflexes are down-going bilaterally. DTRs are symmetrical throughout. Sensory, motor and coordinative functions  appear intact.   Problems:  Medical Problems Added: 1)  Dx of Diarrhea  (ICD-787.91) 2)  Dx of Unspecified Myalgia and Myositis  (ICD-729.1)  Impression & Recommendations:  Problem # 1:  UNSPECIFIED MYALGIA AND MYOSITIS (ICD-729.1) Assessment New  Her updated medication list for this problem includes:    Aspirin Ec 325 Mg Tbec (Aspirin) .Marland Kitchen... Take one tablet by mouth daily    Vicodin Es 7.5-750 Mg Tabs (Hydrocodone-acetaminophen) ..... Use as directed    Mobic 15 Mg Tabs (Meloxicam) .Marland Kitchen... Take one tab by mouth once daily    Demerol 50 Mg Tabs (Meperidine hcl) .Marland Kitchen... Take 1 tablet by mouth four times a day  Orders: Venipuncture (47829) TLB-BMP (Basic Metabolic Panel-BMET) (80048-METABOL) TLB-CBC Platelet - w/Differential (85025-CBCD) TLB-Hepatic/Liver Function Pnl (80076-HEPATIC) TLB-CK Total Only(Creatine Kinase/CPK) (82550-CK) TLB-Sedimentation Rate (ESR) (85652-ESR) T-Antinuclear Antib (ANA) (56213-08657) Specimen Handling (84696)  Problem # 2:  DIARRHEA (ICD-787.91) Assessment: New  Orders: Venipuncture (29528) TLB-BMP (Basic Metabolic Panel-BMET) (80048-METABOL) TLB-CBC Platelet - w/Differential (85025-CBCD) TLB-Hepatic/Liver Function Pnl (80076-HEPATIC) TLB-CK Total Only(Creatine Kinase/CPK) (82550-CK) TLB-Sedimentation Rate (ESR) (85652-ESR) T-Antinuclear Antib (ANA) (41324-40102) Specimen Handling (72536)  Complete Medication List: 1)  Aspirin Ec 325 Mg Tbec (Aspirin) .... Take one tablet by mouth daily 2)  Premarin 0.625 Mg/gm Crea (Estrogens, conjugated) .Marland KitchenMarland KitchenMarland Kitchen 1  app. per week 3)  Nasonex 50 Mcg/act Susp (Mometasone furoate) .... Prn 4)  Prilosec 20 Mg Cpdr (Omeprazole) .... Take one by mouth two times a day 5)  Nitrostat 0.4 Mg Subl (Nitroglycerin) .Marland Kitchen.. 1 tablet under tongue at onset of chest pain; you may repeat every 5 minutes for up to 3 doses. 6)  Vitamin D 2000 Unit Tabs (Cholecalciferol) .Marland Kitchen.. 1 tab once daily 7)  Calcium 600 Mg Tabs (Calcium) .Marland Kitchen.. 1 tab  two times a day 8)  Systane 0.4-0.3 % Soln (Polyethyl glycol-propyl glycol) .... Use as directed 9)  Cardizem Cd 180 Mg Cp24 (Diltiazem hcl coated beads) .... One daily 10)  Salex 6 % (cream) Kit (Salicylic acid-cleanser) .... Apply to feet as needed 11)  Zyrtec Allergy 10 Mg Tabs (Cetirizine hcl) .... Take one tab at bedtime as needed 12)  Fish Oil 1200 Mg Caps (Omega-3 fatty acids) .... Take one tab two times a day 13)  Synthroid 50 Mcg Tabs (Levothyroxine sodium) .... Take 1 tablet by mouth every morning 14)  Furosemide 20 Mg Tabs (Furosemide) .Marland Kitchen.. 1 as needed  for swelling 15)  Klor-con M20 20 Meq Cr-tabs (Potassium chloride crys cr) .Marland Kitchen.. 1 as needed with lasix 16)  Ambien 10 Mg Tabs (Zolpidem tartrate) .Marland Kitchen.. 1 tab @ bedtime as needed 17)  Lipitor 80 Mg Tabs (Atorvastatin calcium) .... Take one by mouth at bedtime 18)  Vicodin Es 7.5-750 Mg Tabs (Hydrocodone-acetaminophen) .... Use as directed 19)  Proventil Hfa 108 (90 Base) Mcg/act Aers (Albuterol sulfate) .... 2 puffs two times a day 20)  Benzonatate 100 Mg Caps (Benzonatate) .... Take one tab by mouth four times a day 21)  Alprazolam 0.5 Mg Tabs (Alprazolam) .... Take one tab by mouth once daily 22)  Mobic 15 Mg Tabs (Meloxicam) .... Take one tab by mouth once daily 23)  Demerol 50 Mg Tabs (Meperidine hcl) .... Take 1 tablet by mouth four times a day 24)  Zofran 4 Mg Tabs (Ondansetron hcl) .... Take 1 tablet by mouth four times a day  Patient Instructions: 1)  we will get your blood work today, I will call you if soon as I get the reports in the morning. 2)  In the meantime, because the diarrhea go on a clear liquid diet.  Continue the Cardizem and euthyroid but hold all your other medications. 3)  Let's try Demerol 50 mg, along with 4 mg of Zofran every 4 to 6 hours to help relieve his symptoms until we can find the cause. 4)  If your symptoms get worse we can always put u  in the hospital for further evaluation Prescriptions: ZOFRAN  4 MG TABS (ONDANSETRON HCL) Take 1 tablet by mouth four times a day  #30 x 0   Entered and Authorized by:   Roderick Pee MD   Signed by:   Roderick Pee MD on 04/11/2010   Method used:   Print then Give to Patient   RxID:   [id number] DEMEROL 50 MG TABS (MEPERIDINE HCL) Take 1 tablet by mouth four times a day  #30 x 0   Entered and Authorized by:   Roderick Pee MD   Signed by:   Roderick Pee MD on 04/11/2010   Method used:   Print then Give to Patient   RxID:   [id number]

## 2010-08-16 NOTE — Progress Notes (Signed)
Summary: triage- nausea weight loss abd pain  Phone Note Call from Patient Call back at [facility] Phone [phone]   Caller: Patient Call For: Dr. Marina Goodell Reason for Call: Talk to Nurse Summary of Call: weight loss, diarrhea, nausea, abd pain... would like to be seen this week... was advised to see dr. Marina Goodell asap by Dr. Tawanna Cooler per pt Initial call taken by: Vallarie Mare,  April 12, 2010 12:03 PM  Follow-up for Phone Call        pt has lost 6 pounds in the last 4 days and has abd pain with nausea and diarrhea.  Cx for tomorrow and was added. Follow-up by: Chales Abrahams CMA Duncan Dull),  April 12, 2010 1:40 PM

## 2010-08-16 NOTE — Assessment & Plan Note (Signed)
Summary: 3 day rov/njr   Vital Signs:  Patient profile:   65 year old female Menstrual status:  postmenopausal Weight:      163 pounds Temp:     98.3 degrees F oral BP sitting:   160 / 84  (left arm) Cuff size:   regular  Vitals Entered By: Kern Reap CMA Duncan Dull) (August 26, 2009 1:46 PM) Comments follow up office visit   Primary Care Provider:  Roderick Pee MD   History of Present Illness: Rebecca [name] is a 65 year old, married female, retired Engineer, civil (consulting) nonsmoker, who comes in today for follow-up of coughing.  We saw her 4 days ago with a flare of her cough.  She has a history of underlying allergic rhinitis and asthma.  At that time, we felt that her asthma has flared up and restarted her Qvar one to b.i.d..  She comes back today saying she is no better.  In the meantime, she is having symptoms of reflux.  She is having burning in her esophagus.  The other night.  She took some medicine in and laid down and had a coughing spell for about 3 hours.  She is not sure if the pill got stuck in her esophagus.  She had upper endoscopy years ago by Dr. Doreatha Martin.  At that time.  She has some erythematous areas.  A small hilar hernia, but no strictures, however, that was many years ago.  Allergies: 1)  ! * Ivp Dye 2)  ! Sulfa 3)  ! Bentyl  Review of Systems      See HPI  Physical Exam  General:  Well-developed,well-nourished,in no acute distress; alert,appropriate and cooperative throughout examination Lungs:  Normal respiratory effort, chest expands symmetrically. Lungs are clear to auscultation, no crackles or wheezes.   Impression & Recommendations:  Problem # 1:  CHEST PAIN, ATYPICAL (ICD-786.59) Assessment Unchanged  Orders: Gastroenterology Referral (GI)  Problem # 2:  ASTHMA NOS W/ACUTE EXACERBATION (ICD-493.92) Assessment: Improved  The following medications were removed from the medication list:    Proventil Hfa 108 (90 Base) Mcg/act Aers (Albuterol sulfate) .Marland Kitchen... 2 ps  three times a day Her updated medication list for this problem includes:    Prednisone 20 Mg Tabs (Prednisone) ..... Uad    Qvar 40 Mcg/act Aers (Beclomethasone dipropionate) .Marland Kitchen... 1 puff two times a day  Complete Medication List: 1)  Aspirin Ec 325 Mg Tbec (Aspirin) .... Take one tablet by mouth daily 2)  Premarin 0.625 Mg/gm Crea (Estrogens, conjugated) .Marland Kitchen.. 1 app. per week 3)  Nasonex 50 Mcg/act Susp (Mometasone furoate) .... Prn 4)  Nexium 40 Mg Pack (Esomeprazole magnesium) .... Once daily 5)  Nitroquick 0.4 Mg Subl (Nitroglycerin) .... As needed 6)  Vitamin D 2000 Unit Tabs (Cholecalciferol) .Marland Kitchen.. 1 tab once daily 7)  Calcium 600 Mg Tabs (Calcium) .Marland Kitchen.. 1 tab two times a day 8)  Systane 0.4-0.3 % Soln (Polyethyl glycol-propyl glycol) .... Use as directed 9)  Cardizem Cd 180 Mg Cp24 (Diltiazem hcl coated beads) .... One daily 10)  Meclizine Hcl 25 Mg Tabs (Meclizine hcl) .... Take one tab as needed 11)  Salex 6 % (cream) Kit (Salicylic acid-cleanser) .... Apply to feet as needed 12)  Zyrtec Allergy 10 Mg Tabs (Cetirizine hcl) .... Take one tab at bedtime as needed 13)  Fish Oil 1200 Mg Caps (Omega-3 fatty acids) .... Take one tab two times a day 14)  Synthroid 50 Mcg Tabs (Levothyroxine sodium) .... Take 1 tablet by mouth every morning 15)  Furosemide 20 Mg Tabs (Furosemide) .Marland Kitchen.. 1 as needed  for swelling 16)  Klor-con M20 20 Meq Cr-tabs (Potassium chloride crys cr) .Marland Kitchen.. 1 as needed with lasix 17)  Prednisone 20 Mg Tabs (Prednisone) .... Uad 18)  Ambien 10 Mg Tabs (Zolpidem tartrate) .Marland Kitchen.. 1 tab @ bedtime as needed 19)  Qvar 40 Mcg/act Aers (Beclomethasone dipropionate) .Marland Kitchen.. 1 puff two times a day  Patient Instructions: 1)  do the anti-reflux measures that we outlined.  Also taken OTC Prilosec 20 mg a for breakfast and 20 mg before your evening meal.  Increase the Qvar two puffs twice a day. 2)  I will put in a request for you to see Dr. Leone [name] for GI consult.

## 2010-08-16 NOTE — Progress Notes (Signed)
Summary: Epigastric Pain  Phone Note Call from Patient Call back at Home Phone [phone]   Call For: Dr Leone Payor Reason for Call: Talk to Nurse Summary of Call: Alot of coughing, epigastric pain going thru her back. No energy.  Scheduled an appt for next available on 09-23-09 but feeles that she needs to be seen next week. Initial call taken by: Leanor Kail [facility],  August 27, 2009 8:30 AM  Follow-up for Phone Call        Patient  wants to be seen next week.  Patient  is rescheduled to see Willette Cluster RNP for 08-30-09 Follow-up by: Darcey Nora RN, CGRN,  August 27, 2009 9:13 AM

## 2010-08-16 NOTE — Assessment & Plan Note (Signed)
Summary: 2nd hep a and b inj/cjr  Nurse Visit   Allergies: 1)  ! * Ivp Dye 2)  ! Sulfa 3)  ! Bentyl 4)  ! Codeine 5)  ! Hydrocodone  Immunizations Administered:  TwinRix # 2:    Vaccine Type: TwinRix    Site: right deltoid    Mfr: GlaxoSmithKline    Dose: 1.0 ml    Route: IM    Given by: Kathrynn Speed CMA    Exp. Date: 10/07/2011    Lot #: [id number]    VIS given: 04/04/07 version given December 20, 2009.    Physician counseled: yes  Orders Added: 1)  TwinRix 1ml ( Hep A&B Adult dose) [90636] 2)  Admin 1st Vaccine [phone]

## 2010-08-16 NOTE — Procedures (Signed)
Summary: EGD   EGD  Procedure date:  03/05/2003  Findings:      Location: [facility]   Patient Name: Rebecca [name], Rebecca [name] MRN:  Procedure Procedures: Panendoscopy (EGD) CPT: 43235.  Personnel: Endoscopist: Ulyess Mort, MD.  Referred By: Eugenio Hoes Tawanna Cooler, MD.  Exam Location: Exam performed in Outpatient Clinic. Outpatient  Patient Consent: Procedure, Alternatives, Risks and Benefits discussed, consent obtained, from patient. Consent was obtained by the RN.  Indications Symptoms: Pulmonary symptoms, including:  Asthma. Reflux symptoms  History  Pre-Exam Physical: Performed Mar 05, 2003  Entire physical exam was normal.  Exam Exam Info: Maximum depth of insertion Duodenum, intended Duodenum. Vocal cords visualized. Gastric retroflexion performed. Images were not taken. ASA Classification: II. Tolerance: good.  Sedation Meds: Patient assessed and found to be appropriate for moderate (conscious) sedation. Fentanyl 50 mcg. given IV. Versed 5 mg. given IV.  Monitoring: BP and pulse monitoring done. Oximetry used. Supplemental O2 given  Findings - HIATAL HERNIA: 2 cms. in length. ICD9: Hernia, Hiatal: 553.3. - MUCOSAL ABNORMALITY: Duodenal Bulb to Duodenal 2nd Portion. Granular mucosa.  - MUCOSAL ABNORMALITY: Body to Antrum. Granular mucosa.   Assessment Abnormal examination, see findings above.  Diagnoses: 553.3: Hernia, Hiatal.   Events  Unplanned Intervention: No unplanned interventions were required.  Unplanned Events: There were no complications. Plans Medication(s): Continue current medications. PPI:   Patient Education: Patient given standard instructions for: Hiatal Hernia. Reflux.  Disposition: After procedure patient sent to recovery. After recovery patient sent home.    CC: Roderick Pee, MD  This report was created from the original endoscopy report, which was reviewed and signed by the above listed endoscopist.

## 2010-08-16 NOTE — Letter (Signed)
Summary: New Patient letter  [facility]  [address] [place], [place] [address]   Phone: [phone]  Fax: [phone]       08/26/2009 MRN: [id number]  [facility] [address] [place], [place]  [address]  Dear Ms. Goodness,  Welcome to the Gastroenterology Division at [facility].    You are scheduled to see Dr.  Leone Payor on 09-23-09 at 2pm on the 3rd floor at [facility], [address].  We ask that you try to arrive at our office 15 minutes prior to your appointment time to allow for check-in.  We would like you to complete the enclosed self-administered evaluation form prior to your visit and bring it with you on the day of your appointment.  We will review it with you.  Also, please bring a complete list of all your medications or, if you prefer, bring the medication bottles and we will list them.  Please bring your insurance card so that we may make a copy of it.  If your insurance requires a referral to see a specialist, please bring your referral form from your primary care physician.  Co-payments are due at the time of your visit and may be paid by cash, check or credit card.     Your office visit will consist of a consult with your physician (includes a physical exam), any laboratory testing he/she may order, scheduling of any necessary diagnostic testing (e.g. x-ray, ultrasound, CT-scan), and scheduling of a procedure (e.g. Endoscopy, Colonoscopy) if required.  Please allow enough time on your schedule to allow for any/all of these possibilities.    If you cannot keep your appointment, please call [phone] to cancel or reschedule prior to your appointment date.  This allows [place] the opportunity to schedule an appointment for another patient in need of care.  If you do not cancel or reschedule by 5 p.m. the business day prior to your appointment date, you will be charged a $50.00 late cancellation/no-show fee.    Thank you for choosing  [facility] for your medical needs.  We appreciate the opportunity to care for you.  Please visit [place] at our website  to learn more about our practice.                     Sincerely,                                                             The Gastroenterology Division

## 2010-08-16 NOTE — Progress Notes (Signed)
Summary: Drug Interaction?  Phone Note From Pharmacy Call back at [facility] Phone [phone]   Caller: Sue Lush Summary of Call: Pt prescribled biaxin today, this medication has interaction with diltiazem.  Please advise Initial call taken by: Trixie Dredge,  October 26, 2009 1:04 PM  Follow-up for Phone Call        Advised Per Dr. Tawanna Cooler there is a slight potential for drug interaction. However, ok to fill.   Follow-up by: Trixie Dredge,  October 26, 2009 1:05 PM

## 2010-08-16 NOTE — Progress Notes (Signed)
Summary: rx refills  Phone Note Call from Patient   Caller: Patient Call For: Roderick Pee MD Summary of Call: patient is calling because she would like printed refills of hydrocodone syrup ( if possible pill form) and preventil three times a day because they are going on a trip for 3 months.  She will be here for her injection next week and will pick them up then. Initial call taken by: Kern Reap CMA Duncan Dull),  December 17, 2009 12:58 PM  Follow-up for Phone Call        ok Follow-up by: Roderick Pee MD,  December 17, 2009 1:22 PM  Additional Follow-up for Phone Call Additional follow up Details #1::        patient would like to try vicodin a small amount Additional Follow-up by: Kern Reap CMA Duncan Dull),  December 17, 2009 3:00 PM    Additional Follow-up for Phone Call Additional follow up Details #2::    disp #30 UAD 1 refill Follow-up by: Roderick Pee MD,  December 19, 2009 8:24 PM  New/Updated Medications: VICODIN ES 7.5-750 MG TABS (HYDROCODONE-ACETAMINOPHEN) use as directed PROVENTIL HFA 108 (90 BASE) MCG/ACT AERS (ALBUTEROL SULFATE) 2 puffs two times a day Prescriptions: PROVENTIL HFA 108 (90 BASE) MCG/ACT AERS (ALBUTEROL SULFATE) 2 puffs two times a day  #3 x 2   Entered by:   Kern Reap CMA (AAMA)   Authorized by:   Roderick Pee MD   Signed by:   Kern Reap CMA (AAMA) on 12/20/2009   Method used:   Print then Give to Patient   RxID:   [id number] VICODIN ES 7.5-750 MG TABS (HYDROCODONE-ACETAMINOPHEN) use as directed  #30 x 1   Entered by:   Kern Reap CMA (AAMA)   Authorized by:   Roderick Pee MD   Signed by:   Kern Reap CMA (AAMA) on 12/20/2009   Method used:   Print then Give to Patient   RxID:   [id number]

## 2010-08-16 NOTE — Progress Notes (Signed)
Summary: refill  Phone Note Refill Request Message from:  Patient on Nov 30, 2009 11:09 AM  Refills Requested: Medication #1:  SYNTHROID 50 MCG TABS Take 1 tablet by mouth every morning  Method Requested: Electronic Initial call taken by: Kern Reap CMA Duncan Dull),  Nov 30, 2009 11:10 AM    Prescriptions: SYNTHROID 50 MCG TABS (LEVOTHYROXINE SODIUM) Take 1 tablet by mouth every morning  #100 x 3   Entered by:   Kern Reap CMA (AAMA)   Authorized by:   Roderick Pee MD   Signed by:   Kern Reap CMA (AAMA) on 11/30/2009   Method used:   Faxed to ...       [facility] [employer] education [employer])       [address] [address]       [place], [place]  [address]       Ph: [phone]       Fax: [phone]   RxID:   [id number]

## 2010-08-16 NOTE — Letter (Signed)
Summary: [facility] Instructions  [facility]  [address] [place], [place] [address]   Phone: [phone]  Fax: [phone]       Rebecca [name]    1946/02/23    MRN: [id number]        Procedure Day /Date: 08-31-09     Arrival Time: 2:00 PM     Procedure Time: 3:00 PM     Location of Procedure:                          [facility] (4th Floor) PREPARATION FOR COLONOSCOPY WITH MOVIPREP    THE DAY BEFORE YOUR PROCEDURE         DATE: 08-30-09  DAY: Monday  1.  Drink clear liquids the entire day-NO SOLID FOOD  2.  Do not drink anything colored red or purple.  Avoid juices with pulp.  No orange juice.  3.  Drink at least 64 oz. (8 glasses) of fluid/clear liquids during the day to prevent dehydration and help the prep work efficiently.  CLEAR LIQUIDS INCLUDE: Water Jello Ice Popsicles Tea (sugar ok, no milk/cream) Powdered fruit flavored drinks Coffee (sugar ok, no milk/cream) Gatorade Juice: apple, white grape, white cranberry  Lemonade Clear bullion, consomm, broth Carbonated beverages (any kind) Strained chicken noodle soup Hard Candy                             4.  In the morning, mix first dose of MoviPrep solution:    Empty 1 Pouch A and 1 Pouch B into the disposable container    Add lukewarm drinking water to the top line of the container. Mix to dissolve    Refrigerate (mixed solution should be used within 24 hrs)  5.  Begin drinking the prep at 5:00 p.m. The MoviPrep container is divided by 4 marks.   Every 15 minutes drink the solution down to the next mark (approximately 8 oz) until the full liter is complete.   6.  Follow completed prep with 16 oz of clear liquid of your choice (Nothing red or purple).  Continue to drink clear liquids until bedtime.  7.  Before going to bed, mix second dose of MoviPrep solution:    Empty 1 Pouch A and 1 Pouch B into the disposable container    Add lukewarm drinking water to the top line  of the container. Mix to dissolve    Refrigerate  THE DAY OF YOUR PROCEDURE      DATE: 08-31-09 DAY: Tuesday  Beginning at 10:00 AM (5 hours before procedure):         1. Every 15 minutes, drink the solution down to the next mark (approx 8 oz) until the full liter is complete.  2. Follow completed prep with 16 oz. of clear liquid of your choice.    3. You may drink clear liquids until 1:00 PM  (2 HOURS BEFORE PROCEDURE).   MEDICATION INSTRUCTIONS  Unless otherwise instructed, you should take regular prescription medications with a small sip of water   as early as possible the morning of your procedure.      OTHER INSTRUCTIONS  You will need a responsible adult at least 67 years of age to accompany you and drive you home.   This person must remain in the waiting room during your procedure.  Wear loose fitting clothing that is easily removed.  Leave jewelry and  other valuables at home.  However, you may wish to bring a book to read or  an iPod/MP3 player to listen to music as you wait for your procedure to start.  Remove all body piercing jewelry and leave at home.  Total time from sign-in until discharge is approximately 2-3 hours.  You should go home directly after your procedure and rest.  You can resume normal activities the  day after your procedure.  The day of your procedure you should not:   Drive   Make legal decisions   Operate machinery   Drink alcohol   Return to work  You will receive specific instructions about eating, activities and medications before you leave.    The above instructions have been reviewed and explained to me by   _______________________    I fully understand and can verbalize these instructions _____________________________ Date _________

## 2010-08-16 NOTE — Assessment & Plan Note (Signed)
Summary: hep a and b/njr  Nurse Visit   Allergies: 1)  ! * Ivp Dye 2)  ! Sulfa 3)  ! Bentyl 4)  ! Codeine 5)  ! Hydrocodone  Immunizations Administered:  TwinRix # 1:    Vaccine Type: TwinRix    Site: right deltoid    Mfr: GlaxoSmithKline    Dose: 0.1 ml    Route: IM    Given by: Kern Reap CMA (AAMA)    Exp. Date: 10/07/2011    Lot #: [id number]  Orders Added: 1)  TwinRix 1ml ( Hep A&B Adult dose) [90636] 2)  Admin 1st Vaccine [90471]

## 2010-08-16 NOTE — Miscellaneous (Signed)
Summary: Waiver of Liability for Zostav[employer]  Waiver of Liability for Zostav[employer]   Imported By: Maryln Gottron 04/06/2010 10:39:21  _____________________________________________________________________  External Attachment:    Type:   Image     Comment:   External Document

## 2010-08-16 NOTE — Procedures (Signed)
Summary: Colonoscopy  Patient: [name] Note: All result statuses are Final unless otherwise noted.  Tests: (1) Colonoscopy (COL)   COL Colonoscopy           DONE     [facility]     [address]     [place], [place]  [address]           COLONOSCOPY PROCEDURE REPORT           PATIENT:  Rebecca [name], Rebecca [name]  MR#:  [id number]     BIRTHDATE:  Jan 19, 1946, 63 yrs. old  GENDER:  female           ENDOSCOPIST:  Wilhemina Bonito. Eda Keys, MD     Referred by:  Office           PROCEDURE DATE:  08/31/2009     PROCEDURE:  Surveillance Colonoscopy     ASA CLASS:  Class II     INDICATIONS:  history of pre-cancerous (adenomatous) colon polyps     (2000 hp; neg. 2004)           MEDICATIONS:   Fentanyl 100 mcg IV, Versed 12 mg IV, Benadryl 50     mg IV           DESCRIPTION OF PROCEDURE:   After the risks benefits and     alternatives of the procedure were thoroughly explained, informed     consent was obtained.  Digital rectal exam was performed and     revealed no abnormalities.   The LB CF-H180AL [id number] endoscope     was introduced through the anus and advanced to the cecum, which     was identified by both the appendix and ileocecal valve, without     limitations. Time to cecum = 5:49min.The quality of the prep was     excellent, using MoviPrep.  The instrument was then slowly     withdrawn (t=7:53 min) as the colon was fully examined.     <<PROCEDUREIMAGES>>           FINDINGS:  Mild diverticulosis was found in the sigmoid colon.     This was otherwise a normal examination of the colon.  No polyps or     cancers were seen.   Retroflexed views in the rectum revealed no     abnormalities.    The scope was then withdrawn from the patient     and the procedure completed.           COMPLICATIONS:  None           ENDOSCOPIC IMPRESSION:     1) Mild diverticulosis in the sigmoid colon     2) Otherwise normal examination     3) No polyps or cancers           RECOMMENDATIONS:     1)  Continue current colorectal screening recommendations for     "routine risk" patients with a repeat colonoscopy in 10 years.           ______________________________     Wilhemina Bonito. Eda Keys, MD           CC:  The Patient; Roderick Pee, MD           n.     Rosalie DoctorWilhemina Bonito. Eda Keys at 08/31/2009 03:14 PM           Scheryl Marten, [id number]  Note: An exclamation mark (!) indicates a result that was not dispersed  into the flowsheet. Document Creation Date: 08/31/2009 3:14 PM _______________________________________________________________________  (1) Order result status: Final Collection or observation date-time: 08/31/2009 15:09 Requested date-time:  Receipt date-time:  Reported date-time:  Referring Physician:   Ordering Physician: Fransico Setters [phone]) Specimen Source:  Source: Launa Grill Order Number: [id number] Lab site:   Appended Document: Colonoscopy    Clinical Lists Changes  Observations: Added new observation of COLONNXTDUE: 08/2019 (08/31/2009 16:17)

## 2010-08-16 NOTE — Progress Notes (Signed)
Summary: ATB  Phone Note Call from Patient   Caller: Patient Call For: Roderick Pee MD Reason for Call: Talk to Doctor Summary of Call: patient is not better. she would like to try a course of ATB.  she has a productive cough with brown color to it.  okay to leave a message at home phone. Initial call taken by: Kern Reap CMA Duncan Dull),  October 26, 2009 9:46 AM  Follow-up for Phone Call        Biaxin 500 mg b.i.d., dispense 20 tabs no refills Follow-up by: Roderick Pee MD,  October 26, 2009 10:01 AM    New/Updated Medications: BIAXIN 500 MG TABS (CLARITHROMYCIN) take one tab two times a day Prescriptions: BIAXIN 500 MG TABS (CLARITHROMYCIN) take one tab two times a day  #20 x 0   Entered by:   Kern Reap CMA (AAMA)   Authorized by:   Roderick Pee MD   Signed by:   Kern Reap CMA (AAMA) on 10/26/2009   Method used:   Electronically to        [facility] [address] (retail)       [phone]       [place], [place]  [address]       Ph: [phone]       Fax: [phone]   RxID:   [id number]

## 2010-08-16 NOTE — Procedures (Signed)
Summary: Colonoscopy   Colonoscopy  Procedure date:  03/05/2003  Findings:      Location:  [facility].    Procedures Next Due Date:    Colonoscopy: 03/2008 Patient Name: Rebecca [name], Rebecca [name] MRN:  Procedure Procedures: Colonoscopy CPT: 04540.  Personnel: Endoscopist: Ulyess Mort, MD.  Exam Location: Exam performed in Outpatient Clinic. Outpatient  Patient Consent: Procedure, Alternatives, Risks and Benefits discussed, consent obtained, from patient. Consent was obtained by the RN.  Indications  Surveillance of: Adenomatous Polyp(s).  History  Pre-Exam Physical: Performed Mar 05, 2003. Entire physical exam was normal.  Exam Exam: Extent of exam reached: Cecum, extent intended: Cecum.  The cecum was identified by appendiceal orifice and IC valve. Colon retroflexion performed. Images were not taken. ASA Classification: II. Tolerance: good.  Monitoring: Pulse and BP monitoring, Oximetry used. Supplemental O2 given.  Colon Prep Prep results: good.  Sedation Meds: Patient assessed and found to be appropriate for moderate (conscious) sedation. Fentanyl 125 mcg. given IV. Versed 12 given IV.  Findings - NOT SEEN ON EXAM: Cecum to Rectum. Polyps, AVM's, Colitis, Tumors, Melanosis, Crohn's, Diverticulosis, Hemorrhoids,   Assessment Normal examination.  Events  Unplanned Interventions: No intervention was required.  Unplanned Events: There were no complications. Plans Medication Plan: Continue current medications.  Patient Education: Patient given standard instructions for: Yearly hemoccult testing recommended. Patient instructed to get routine colonoscopy every 5 years.  Disposition: After procedure patient sent to recovery. After recovery patient sent home.   CC: [name], MD  This report was created from the original endoscopy report, which was reviewed and signed by the above listed endoscopist.

## 2010-08-16 NOTE — Assessment & Plan Note (Signed)
Summary: sore throat/ear ache/body aches/fever/cjr   Vital Signs:  Patient profile:   65 year old female Menstrual status:  postmenopausal Weight:      158 pounds Temp:     99.3 degrees F oral BP sitting:   112 / 72  (left arm) Cuff size:   regular  Vitals Entered By: Kern Reap CMA Duncan Dull) (October 21, 2009 11:21 AM) CC: head congestion, ST, cough Is Patient Diabetic? No   Primary Care Provider:  Roderick Pee MD  CC:  head congestion, ST, and cough.  History of Present Illness: Rebecca [name] is a 65 year old, married female married female, who comes in with a 3 day history of fever, chills, sore throat, nonproductive cough.  She states over the past weekend.  She had some head congestion, postnasal drip them Monday developed a fever of 102, with fever, chills.  Her granddaughter was diagnosed last week with strep throat.  Review of systems negative except for two to 3 loose bowel movements a day.  No vomiting  Allergies: 1)  ! * Ivp Dye 2)  ! Sulfa 3)  ! Bentyl 4)  ! Codeine 5)  ! Hydrocodone  Past History:  Past medical, surgical, family and social histories (including risk factors) reviewed for relevance to current acute and chronic problems.  Past Medical History: Reviewed history from 09/22/2009 and no changes required. MYOCARDIAL INFARCTION, HX OF (ICD-412) CAD, NATIVE VESSEL (ICD-414.01) CHEST PAIN, ATYPICAL (ICD-786.59) HYPERLIPIDEMIA (ICD-272.4) HYPERTENSION (ICD-401.9) FATIGUE (ICD-780.79) EDEMA (ICD-782.3) UNSPECIFIED HYPOTHYROIDISM (ICD-244.9) EXTRINSIC ASTHMA, UNSPECIFIED (ICD-493.00) VIRAL URI (ICD-465.9) KERATOSIS, SEBORRHEIC NEC (ICD-702.19) LABYRINTHITIS NOS (ICD-386.30) SKIN CANCER, HX OF (ICD-V10.83) OSTEOARTHRITIS (ICD-715.90) FAMILY HISTORY OF MELANOMA (ICD-V16.8) IRRITABLE BOWEL SYNDROME, HX OF (ICD-V12.79) COLONIC POLYPS, HX OF (ICD-V12.72) GERD (ICD-530.81) DEPRESSION (ICD-311) ASTHMA NOS W/ACUTE EXACERBATION (GNF-621.30)  Past Surgical  History: Reviewed history from 07/30/2008 and no changes required. Cholecystectomy Lumpectomy Tubal ligation-Bilat Cath 09/05/06  Family History: Reviewed history from 08/30/2009 and no changes required. Family History of Melanoma Family History of Cardiovascular disorder..mother started havin heart attacks @ 49, lived into her 27's. daughter died @ 47 metastatic melanoma Family History of Breast Cancer:sister  Social History: Reviewed history from 08/30/2009 and no changes required. Occupation: Charity fundraiser Married Never Smoked Alcohol use-yes 5 per yea rare Daily Caffeine Use one pre week Illicit Drug Use - no  Review of Systems      See HPI  Physical Exam  General:  Well-developed,well-nourished,in no acute distress; alert,appropriate and cooperative throughout examination Head:  Normocephalic and atraumatic without obvious abnormalities. No apparent alopecia or balding. Eyes:  No corneal or conjunctival inflammation noted. EOMI. Perrla. Funduscopic exam benign, without hemorrhages, exudates or papilledema. Vision grossly normal. Ears:  External ear exam shows no significant lesions or deformities.  Otoscopic examination reveals clear canals, tympanic membranes are intact bilaterally without bulging, retraction, inflammation or discharge. Hearing is grossly normal bilaterally. Nose:  External nasal examination shows no deformity or inflammation. Nasal mucosa are pink and moist without lesions or exudates. Mouth:  Oral mucosa and oropharynx without lesions or exudates.  Teeth in good repair. Neck:  No deformities, masses, or tenderness noted. Chest Wall:  No deformities, masses, or tenderness noted. Lungs:  Normal respiratory effort, chest expands symmetrically. Lungs are clear to auscultation, no crackles or wheezes. Cervical Nodes:  No lymphadenopathy noted   Impression & Recommendations:  Problem # 1:  INFLUENZA WITH OTHER MANIFESTATIONS (ICD-487.8) Assessment New  Complete  Medication List: 1)  Aspirin Ec 325 Mg Tbec (Aspirin) .... Take one tablet by mouth  daily 2)  Premarin 0.625 Mg/gm Crea (Estrogens, conjugated) .Marland Kitchen.. 1 app. per week 3)  Nasonex 50 Mcg/act Susp (Mometasone furoate) .... Prn 4)  Prilosec 20 Mg Cpdr (Omeprazole) .... Take one by mouth two times a day 5)  Nitrostat 0.4 Mg Subl (Nitroglycerin) .Marland Kitchen.. 1 tablet under tongue at onset of chest pain; you may repeat every 5 minutes for up to 3 doses. 6)  Vitamin D 2000 Unit Tabs (Cholecalciferol) .Marland Kitchen.. 1 tab once daily 7)  Calcium 600 Mg Tabs (Calcium) .Marland Kitchen.. 1 tab two times a day 8)  Systane 0.4-0.3 % Soln (Polyethyl glycol-propyl glycol) .... Use as directed 9)  Cardizem Cd 180 Mg Cp24 (Diltiazem hcl coated beads) .... One daily 10)  Salex 6 % (cream) Kit (Salicylic acid-cleanser) .... Apply to feet as needed 11)  Zyrtec Allergy 10 Mg Tabs (Cetirizine hcl) .... Take one tab at bedtime as needed 12)  Fish Oil 1200 Mg Caps (Omega-3 fatty acids) .... Take one tab two times a day 13)  Synthroid 50 Mcg Tabs (Levothyroxine sodium) .... Take 1 tablet by mouth every morning 14)  Furosemide 20 Mg Tabs (Furosemide) .Marland Kitchen.. 1 as needed  for swelling 15)  Klor-con M20 20 Meq Cr-tabs (Potassium chloride crys cr) .Marland Kitchen.. 1 as needed with lasix 16)  Ambien 10 Mg Tabs (Zolpidem tartrate) .Marland Kitchen.. 1 tab @ bedtime as needed 17)  Qvar 40 Mcg/act Aers (Beclomethasone dipropionate) .Marland Kitchen.. 1 puff two times a day 18)  Lipitor 80 Mg Tabs (Atorvastatin calcium) .... Take one by mouth at bedtime 19)  Hydrochlorothiazide 25 Mg Tabs (Hydrochlorothiazide) .Marland Kitchen.. 1 tab as needed 20)  Hydromet 5-1.5 Mg/68ml Syrp (Hydrocodone-homatropine) .... 1/2 to 1 tsp three times a day as needed  Patient Instructions: 1)  drink lots of liquids, Tylenol  for  fever and chills, Hydromet one half to 1 teaspoon 3 times a day as needed.  Return p.r.n. Prescriptions: HYDROMET 5-1.5 MG/5ML SYRP (HYDROCODONE-HOMATROPINE) 1/2 to 1 tsp three times a day as needed  #8oz x  1   Entered and Authorized by:   Roderick Pee MD   Signed by:   Roderick Pee MD on 10/21/2009   Method used:   Print then Give to Patient   RxID:   [id number]   Appended Document: sore throat/ear ache/body aches/fever/cjr     Allergies: 1)  ! * Ivp Dye 2)  ! Sulfa 3)  ! Bentyl 4)  ! Codeine 5)  ! Hydrocodone   Complete Medication List: 1)  Aspirin Ec 325 Mg Tbec (Aspirin) .... Take one tablet by mouth daily 2)  Premarin 0.625 Mg/gm Crea (Estrogens, conjugated) .Marland Kitchen.. 1 app. per week 3)  Nasonex 50 Mcg/act Susp (Mometasone furoate) .... Prn 4)  Prilosec 20 Mg Cpdr (Omeprazole) .... Take one by mouth two times a day 5)  Nitrostat 0.4 Mg Subl (Nitroglycerin) .Marland Kitchen.. 1 tablet under tongue at onset of chest pain; you may repeat every 5 minutes for up to 3 doses. 6)  Vitamin D 2000 Unit Tabs (Cholecalciferol) .Marland Kitchen.. 1 tab once daily 7)  Calcium 600 Mg Tabs (Calcium) .Marland Kitchen.. 1 tab two times a day 8)  Systane 0.4-0.3 % Soln (Polyethyl glycol-propyl glycol) .... Use as directed 9)  Cardizem Cd 180 Mg Cp24 (Diltiazem hcl coated beads) .... One daily 10)  Salex 6 % (cream) Kit (Salicylic acid-cleanser) .... Apply to feet as needed 11)  Zyrtec Allergy 10 Mg Tabs (Cetirizine hcl) .... Take one tab at bedtime as needed 12)  Fish Oil 1200  Mg Caps (Omega-3 fatty acids) .... Take one tab two times a day 13)  Synthroid 50 Mcg Tabs (Levothyroxine sodium) .... Take 1 tablet by mouth every morning 14)  Furosemide 20 Mg Tabs (Furosemide) .Marland Kitchen.. 1 as needed  for swelling 15)  Klor-con M20 20 Meq Cr-tabs (Potassium chloride crys cr) .Marland Kitchen.. 1 as needed with lasix 16)  Ambien 10 Mg Tabs (Zolpidem tartrate) .Marland Kitchen.. 1 tab @ bedtime as needed 17)  Qvar 40 Mcg/act Aers (Beclomethasone dipropionate) .Marland Kitchen.. 1 puff two times a day 18)  Lipitor 80 Mg Tabs (Atorvastatin calcium) .... Take one by mouth at bedtime 19)  Hydrochlorothiazide 25 Mg Tabs (Hydrochlorothiazide) .Marland Kitchen.. 1 tab as needed 20)  Hydromet 5-1.5 Mg/68ml  Syrp (Hydrocodone-homatropine) .... 1/2 to 1 tsp three times a day as needed  Other Orders: Rapid Strep ([address])   Laboratory Results  Date/Time Received: October 21, 2009   Other Tests  Rapid Strep: negative Comments: Kern Reap CMA Duncan Dull)  October 21, 2009 4:29 PM

## 2010-08-16 NOTE — Procedures (Signed)
Summary: Upper Endoscopy  Patient: Rebecca [name] Note: All result statuses are Final unless otherwise noted.  Tests: (1) Upper Endoscopy (EGD)   EGD Upper Endoscopy       DONE     [facility]     [address]     [place], [place]  [address]           ENDOSCOPY PROCEDURE REPORT           PATIENT:  Rebecca [name], Rebecca [name]  MR#:  [id number]     BIRTHDATE:  06-02-1946, 63 yrs. old  GENDER:  female           ENDOSCOPIST:  Wilhemina Bonito. Eda Keys, MD     Referred by:  Office           PROCEDURE DATE:  08/31/2009     PROCEDURE:  EGD with biopsy     ASA CLASS:  Class II     INDICATIONS:  chest pain           MEDICATIONS:   There was residual sedation effect present from     prior procedure., Versed 2 mg IV     TOPICAL ANESTHETIC:  Exactacain Spray           DESCRIPTION OF PROCEDURE:   After the risks benefits and     alternatives of the procedure were thoroughly explained, informed     consent was obtained.  The LB GIF-H180 [id number] endoscope was     introduced through the mouth and advanced to the second portion of     the duodenum, without limitations.  The instrument was slowly     withdrawn as the mucosa was fully examined.     <<PROCEDUREIMAGES>>           The upper, middle, and distal third of the esophagus were     carefully inspected and no abnormalities were noted. The z-line     was well seen at the GEJ. The endoscope was pushed into the fundus     which was normal including a retroflexed view. The gastric body,     first and second part of the duodenum were unremarkable. There     were a few tiny erosions in the antrum. CLO bx taken.     Retroflexed views revealed a small hiatal hernia.    The scope was     then withdrawn from the patient and the procedure completed.           COMPLICATIONS:  None           ENDOSCOPIC IMPRESSION:     1) A few tiny Erosions in the antrum     2) Otherwise Normal EGD     3) Gerd     4) NO OBVIOUS GI CAUSE FOR CHEST PAIN FOUND OR  SUSPECTED           RECOMMENDATIONS:     1) Rx CLO if positive     2) CONTINUE GERD MEDS     3) FOLLOW UP WITH DR TODD FOR ONGOING PAIN           ______________________________     Wilhemina Bonito. Eda Keys, MD           CC:  The Patient, Roderick Pee, MD           n.     Rosalie DoctorWilhemina Bonito. Eda Keys at 08/31/2009 03:27 PM  [place], [place], [phone]  Note: An exclamation mark (!) indicates a result that was not dispersed into the flowsheet. Document Creation Date: 08/31/2009 3:27 PM _______________________________________________________________________  (1) Order result status: Final Collection or observation date-time: 08/31/2009 15:18 Requested date-time:  Receipt date-time:  Reported date-time:  Referring Physician:   Ordering Physician: Fransico Setters [phone]) Specimen Source:  Source: Launa Grill Order Number: [id number] Lab site:

## 2010-08-16 NOTE — Assessment & Plan Note (Signed)
Summary: f1y per pt call/per rem/lg   Visit Type:  1 yr f/u Referring Provider:  Roderick Pee MD Primary Provider:  Roderick Pee MD  CC:  chest pain says some has been chest Jiali Linney pain and some has been some GI  pain.Marland Kitchenedema/feet...denies any sob.Marland KitchenMarland Kitchenpt has lost 4 lb since 08/30/09.  History of Present Illness: Mrs Bonfiglio comes in today for evaluation and management of coronary disease. Her she's having no major ischemic symptoms. She had emergency catheterization back in October. Please see below for the results.  He is very compliant with her medications. She denies orthopnea, PND, peripheral edema, palpitations  She has been plagued by a cough since December. Denies any fever chills or hemoptysis. Dr. Tawanna Cooler is following this. I recommended a chest x-ray if this doesn't clear soon.  Clinical Reports Reviewed:  Cardiac Cath:  03/19/2009: Cardiac Cath Findings:   Left ventriculogram done in the RAO position showed an EF of 55-60%.   There was some ectopy, but no obvious Ima Hafner motion abnormalities.      ASSESSMENT:   1. Coronary artery disease with a 40% left anterior descending lesion       which is stable.  There was a 40-50% in-stent restenosis in the       right coronary artery stent, but this is not flow limiting.   2. Normal left ventricular function.      PLAN:  Will be for continued medical therapy with aggressive risk factor   modification.  She will be discharged home later today.               Bevelyn Buckles. Bensimhon, MD   Electronically Signed     Cardiac Cath Findings:   Left ventriculogram done in the RAO position showed an EF of 55-60%.   There was some ectopy, but no obvious Sarah-Jane Nazario motion abnormalities.      ASSESSMENT:   1. Coronary artery disease with a 40% left anterior descending lesion       which is stable.  There was a 40-50% in-stent restenosis in the       right coronary artery stent, but this is not flow limiting.   2. Normal left ventricular function.      PLAN:  Will be for continued medical therapy with aggressive risk factor   modification.  She will be discharged home later today.               Bevelyn Buckles. Bensimhon, MD   Electronically Signed   09/05/2006: Cardiac Cath Findings:  COMPLICATIONS:  None.   FINDINGS:  1. LV:  120/0/4.  EF 65% with hypokinesis of a focal portion of the      mid inferior [place] Hert.  2. No aortic stenosis or mitral regurgitation.  3. Left main:  Angiographically normal.  4. LAD:  Moderate sized vessel giving rise to a single diagonal.      There is a calcified lesion just after the diagonal.  This appears      to be approximately 50% diameter stenosis angiographically and had      a 50% area of stenosis by intravascular ultrasound.  No evidence of      associated thrombus.  5. Ramus intermedius:  Small vessel which is angiographically normal.  6. Circumflex:  Large vessel giving rise to a single large marginal.      It is angiographically normal.  7. RCA:  Moderate sized dominant vessel.  There was an 85% ulcerated  plaque proximally stented to no residual using a bare metal stent.   IMPRESSION/PLAN:  Successful percutaneous revascularization of the  culprit lesion of the right coronary artery.  Will plan medical  management of the moderate stenosis within her LAD.  She should be  maintained on Plavix for nine months given her unstable presentation.  Will continue her aspirin indefinitely.  We will discontinue her  diltiazem in favor of beta blocker given her recurrent myocardial  infarction.   Salvadore Farber, MD  Electronically Signed  03/08/1992: Cardiac Cath Findings:  Present catheterization shows a total RCA occlusion proximally with good collateralization in a  retrograde fashion. Medical therapy will be recommended with increasing doses of beta blocker as tolerated. Kathelyn Gombos. Alphonsus Sias, M.D.   Current Medications (verified): 1)  Aspirin Ec 325 Mg Tbec (Aspirin) .... Take One Tablet By  Mouth Daily 2)  Premarin 0.625 Mg/gm  Crea (Estrogens, Conjugated) .Marland Kitchen.. 1 App. Per Week 3)  Nasonex 50 Mcg/act  Susp (Mometasone Furoate) .... Prn 4)  Prilosec 20 Mg Cpdr (Omeprazole) .... Take One By Mouth Two Times A Day 5)  Nitrostat 0.4 Mg Subl (Nitroglycerin) .Marland Kitchen.. 1 Tablet Under Tongue At Onset of Chest Pain; You May Repeat Every 5 Minutes For Up To 3 Doses. 6)  Vitamin D 2000 Unit Tabs (Cholecalciferol) .Marland Kitchen.. 1 Tab Once Daily 7)  Calcium 600 Mg Tabs (Calcium) .Marland Kitchen.. 1 Tab Two Times A Day 8)  Systane 0.4-0.3 % Soln (Polyethyl Glycol-Propyl Glycol) .... Use As Directed 9)  Cardizem Cd 180 Mg  Cp24 (Diltiazem Hcl Coated Beads) .... One Daily 10)  Salex 6 % (Cream) Kit (Salicylic Acid-Cleanser) .... Apply To Feet As Needed 11)  Zyrtec Allergy 10 Mg Tabs (Cetirizine Hcl) .... Take One Tab At Bedtime As Needed 12)  Fish Oil 1200 Mg Caps (Omega-3 Fatty Acids) .... Take One Tab Two Times A Day 13)  Synthroid 50 Mcg Tabs (Levothyroxine Sodium) .... Take 1 Tablet By Mouth Every Morning 14)  Furosemide 20 Mg Tabs (Furosemide) .Marland Kitchen.. 1 As Needed  For Swelling 15)  Klor-Con M20 20 Meq Cr-Tabs (Potassium Chloride Crys Cr) .Marland Kitchen.. 1 As Needed With Lasix 16)  Ambien 10 Mg Tabs (Zolpidem Tartrate) .Marland Kitchen.. 1 Tab @ Bedtime As Needed 17)  Qvar 40 Mcg/act Aers (Beclomethasone Dipropionate) .Marland Kitchen.. 1 Puff Two Times A Day 18)  Lipitor 80 Mg Tabs (Atorvastatin Calcium) .... Take One By Mouth At Bedtime 19)  Hydrochlorothiazide 25 Mg Tabs (Hydrochlorothiazide) .Marland Kitchen.. 1 Tab As Needed  Allergies: 1)  ! * Ivp Dye 2)  ! Sulfa 3)  ! Bentyl 4)  ! Codeine 5)  ! Hydrocodone  Review of Systems       negative other than history of present illness  Vital Signs:  Patient profile:   65 year old female Menstrual status:  postmenopausal Height:      59 inches Weight:      158 pounds BMI:     32.03 Pulse rate:   82 / minute Pulse rhythm:   irregular BP sitting:   112 / 60  (left arm) Cuff size:   large  Vitals Entered By:  Danielle Rankin, CMA (September 23, 2009 3:51 PM)  Physical Exam  General:  overweight Head:  normocephalic and atraumatic Eyes:  PERRLA/EOM intact; conjunctiva and lids normal. Neck:  Neck supple, no JVD. No masses, thyromegaly or abnormal cervical nodes. Chest Anasophia Pecor:  no deformities or breast masses noted Lungs:  Clear bilaterally to auscultation and percussion. Heart:  Non-displaced PMI,  chest non-tender; regular rate and rhythm, S1, S2 without murmurs, rubs or gallops. Carotid upstroke normal, no bruit. Normal abdominal aortic size, no bruits. Femorals normal pulses, no bruits. Pedals normal pulses. No edema, no varicosities. Abdomen:  Bowel sounds positive; abdomen soft and non-tender without masses, organomegaly, or hernias noted. No hepatosplenomegaly. Msk:  Back normal, normal gait. Muscle strength and tone normal. Pulses:  pulses normal in all 4 extremities Extremities:  No clubbing or cyanosis. Neurologic:  Alert and oriented x 3. Skin:  Intact without lesions or rashes. Psych:  Normal affect.   EKG  Procedure date:  09/23/2009  Findings:      normal sinus rhythm, low voltage QRS, no change,  Impression & Recommendations:  Problem # 1:  CAD, NATIVE VESSEL (ICD-414.01) Assessment Unchanged  Her updated medication list for this problem includes:    Aspirin Ec 325 Mg Tbec (Aspirin) .Marland Kitchen... Take one tablet by mouth daily    Nitrostat 0.4 Mg Subl (Nitroglycerin) .Marland Kitchen... 1 tablet under tongue at onset of chest pain; you may repeat every 5 minutes for up to 3 doses.    Cardizem Cd 180 Mg Cp24 (Diltiazem hcl coated beads) ..... One daily  Orders: EKG w/ Interpretation (93000)  Problem # 2:  MYOCARDIAL INFARCTION, HX OF (ICD-412) Assessment: Unchanged  Her updated medication list for this problem includes:    Aspirin Ec 325 Mg Tbec (Aspirin) .Marland Kitchen... Take one tablet by mouth daily    Nitrostat 0.4 Mg Subl (Nitroglycerin) .Marland Kitchen... 1 tablet under tongue at onset of chest pain; you may repeat  every 5 minutes for up to 3 doses.    Cardizem Cd 180 Mg Cp24 (Diltiazem hcl coated beads) ..... One daily  Problem # 3:  HYPERTENSION (ICD-401.9) Assessment: Improved  Her updated medication list for this problem includes:    Aspirin Ec 325 Mg Tbec (Aspirin) .Marland Kitchen... Take one tablet by mouth daily    Cardizem Cd 180 Mg Cp24 (Diltiazem hcl coated beads) ..... One daily    Furosemide 20 Mg Tabs (Furosemide) .Marland Kitchen... 1 as needed  for swelling    Hydrochlorothiazide 25 Mg Tabs (Hydrochlorothiazide) .Marland Kitchen... 1 tab as needed  Problem # 4:  HYPERLIPIDEMIA (ICD-272.4)  Her updated medication list for this problem includes:    Lipitor 80 Mg Tabs (Atorvastatin calcium) .Marland Kitchen... Take one by mouth at bedtime  Patient Instructions: 1)  Your physician recommends that you schedule a follow-up appointment in: 6 MONTHS WITH DR Coretha Creswell 2)  Your physician recommends that you continue on your current medications as directed. Please refer to the Current Medication list given to you today.

## 2010-08-16 NOTE — Progress Notes (Signed)
Summary: [facility]   Imported By: Sherian Rein 04/14/2010 12:02:56  _____________________________________________________________________  External Attachment:    Type:   Image     Comment:   External Document

## 2010-08-16 NOTE — Assessment & Plan Note (Signed)
Summary: 6 mo f/u ./cy   Visit Type:  6 mo f/u Referring Provider:  Roderick Pee MD Primary Provider:  Roderick Pee MD  CC:  pt recently had right total knee replacement w/Dr. Lequita Halt....denies any cardiac complaints today.  History of Present Illness: Rebecca [name] comes in today for further evaluation and management of her coronary disease.  She had a total knee replacement in August. She had no cardiac complications. She is tolerating rehabilitation well.  She denies angina or ischemic symptoms. She is compliant with her medications. She has not had to use any nitroglycerin.  She is scheduled to have another total knee replacement in January. Her last catheterization was last year which showed nonobstructive disease. She has good left ventricular function.  Clinical Reports Reviewed:  Cardiac Cath:  03/19/2009: Cardiac Cath Findings:   Left ventriculogram done in the RAO position showed an EF of 55-60%.   There was some ectopy, but no obvious wall motion abnormalities.      ASSESSMENT:   1. Coronary artery disease with a 40% left anterior descending lesion       which is stable.  There was a 40-50% in-stent restenosis in the       right coronary artery stent, but this is not flow limiting.   2. Normal left ventricular function.      PLAN:  Will be for continued medical therapy with aggressive risk factor   modification.  She will be discharged home later today.               Bevelyn Buckles. Bensimhon, MD   Electronically Signed     Cardiac Cath Findings:   Left ventriculogram done in the RAO position showed an EF of 55-60%.   There was some ectopy, but no obvious wall motion abnormalities.      ASSESSMENT:   1. Coronary artery disease with a 40% left anterior descending lesion       which is stable.  There was a 40-50% in-stent restenosis in the       right coronary artery stent, but this is not flow limiting.   2. Normal left ventricular function.      PLAN:  Will be  for continued medical therapy with aggressive risk factor   modification.  She will be discharged home later today.               Bevelyn Buckles. Bensimhon, MD   Electronically Signed   09/05/2006: Cardiac Cath Findings:  COMPLICATIONS:  None.   FINDINGS:  1. LV:  120/0/4.  EF 65% with hypokinesis of a focal portion of the      mid inferior wall.  2. No aortic stenosis or mitral regurgitation.  3. Left main:  Angiographically normal.  4. LAD:  Moderate sized vessel giving rise to a single diagonal.      There is a calcified lesion just after the diagonal.  This appears      to be approximately 50% diameter stenosis angiographically and had      a 50% area of stenosis by intravascular ultrasound.  No evidence of      associated thrombus.  5. Ramus intermedius:  Small vessel which is angiographically normal.  6. Circumflex:  Large vessel giving rise to a single large marginal.      It is angiographically normal.  7. RCA:  Moderate sized dominant vessel.  There was an 85% ulcerated      plaque proximally stented to no residual  using a bare metal stent.   IMPRESSION/PLAN:  Successful percutaneous revascularization of the  culprit lesion of the right coronary artery.  Will plan medical  management of the moderate stenosis within her LAD.  She should be  maintained on Plavix for nine months given her unstable presentation.  Will continue her aspirin indefinitely.  We will discontinue her  diltiazem in favor of beta blocker given her recurrent myocardial  infarction.   Salvadore Farber, MD  Electronically Signed  03/08/1992: Cardiac Cath Findings:  Present catheterization shows a total RCA occlusion proximally with good collateralization in a  retrograde fashion. Medical therapy will be recommended with increasing doses of beta blocker as tolerated. Thomas. Alphonsus Sias, M.D.  Nuclear Study:  04/12/2007:  Excerise capacity: adenosine study with no exercise.  Blood Pressure  response:  Clinical symptoms:  ECG impression: No significant ST segment change suggestive of ischemia.  Overall impression: There is mild apical thinning but no sign of scar or ischemia.      Current Medications (verified): 1)  Aspirin Ec 325 Mg Tbec (Aspirin) .... Take One Tablet By Mouth Daily 2)  Premarin 0.625 Mg/gm  Crea (Estrogens, Conjugated) .Marland Kitchen.. 1 App. Per Week 3)  Nasonex 50 Mcg/act  Susp (Mometasone Furoate) .... Prn 4)  Prilosec 20 Mg Cpdr (Omeprazole) .... Take One By Mouth Two Times A Day 5)  Nitrostat 0.4 Mg Subl (Nitroglycerin) .Marland Kitchen.. 1 Tablet Under Tongue At Onset of Chest Pain; You May Repeat Every 5 Minutes For Up To 3 Doses. 6)  Vitamin D 2000 Unit Tabs (Cholecalciferol) .Marland Kitchen.. 1 Tab Once Daily 7)  Calcium 600 Mg Tabs (Calcium) .Marland Kitchen.. 1 Tab Two Times A Day 8)  Systane 0.4-0.3 % Soln (Polyethyl Glycol-Propyl Glycol) .... Use As Directed 9)  Cardizem Cd 180 Mg  Cp24 (Diltiazem Hcl Coated Beads) .... One Daily 10)  Salex 6 % (Cream) Kit (Salicylic Acid-Cleanser) .... Apply To Feet As Needed 11)  Zyrtec Allergy 10 Mg Tabs (Cetirizine Hcl) .... Take One Tab At Bedtime As Needed 12)  Fish Oil 1200 Mg Caps (Omega-3 Fatty Acids) .... Take One Tab Two Times A Day 13)  Synthroid 50 Mcg Tabs (Levothyroxine Sodium) .... Take 1 Tablet By Mouth Every Morning 14)  Furosemide 20 Mg Tabs (Furosemide) .Marland Kitchen.. 1 As Needed  For Swelling 15)  Klor-Con M20 20 Meq Cr-Tabs (Potassium Chloride Crys Cr) .Marland Kitchen.. 1 As Needed With Lasix 16)  Ambien 10 Mg Tabs (Zolpidem Tartrate) .Marland Kitchen.. 1 Tab @ Bedtime As Needed 17)  Lipitor 80 Mg Tabs (Atorvastatin Calcium) .... Take One By Mouth At Bedtime 18)  Vicodin Es 7.5-750 Mg Tabs (Hydrocodone-Acetaminophen) .... Use As Directed 19)  Proventil Hfa 108 (90 Base) Mcg/act Aers (Albuterol Sulfate) .... 2 Puffs Two Times A Day 20)  Benzonatate 100 Mg Caps (Benzonatate) .... Take One Tab By Mouth Four Times A Day 21)  Alprazolam 0.5 Mg Tabs (Alprazolam) .... Take One Tab  By Mouth Once Daily 22)  Mobic 15 Mg Tabs (Meloxicam) .... Take One Tab By Mouth Once Daily  Allergies: 1)  ! * Ivp Dye 2)  ! Sulfa 3)  ! Bentyl 4)  ! Codeine 5)  ! Hydrocodone  Past History:  Past Medical History: Last updated: 09/22/2009 MYOCARDIAL INFARCTION, HX OF (ICD-412) CAD, NATIVE VESSEL (ICD-414.01) CHEST PAIN, ATYPICAL (ICD-786.59) HYPERLIPIDEMIA (ICD-272.4) HYPERTENSION (ICD-401.9) FATIGUE (ICD-780.79) EDEMA (ICD-782.3) UNSPECIFIED HYPOTHYROIDISM (ICD-244.9) EXTRINSIC ASTHMA, UNSPECIFIED (ICD-493.00) VIRAL URI (ICD-465.9) KERATOSIS, SEBORRHEIC NEC (ICD-702.19) LABYRINTHITIS NOS (ICD-386.30) SKIN CANCER, HX OF (ICD-V10.83) OSTEOARTHRITIS (ICD-715.90)  FAMILY HISTORY OF MELANOMA (ICD-V16.8) IRRITABLE BOWEL SYNDROME, HX OF (ICD-V12.79) COLONIC POLYPS, HX OF (ICD-V12.72) GERD (ICD-530.81) DEPRESSION (ICD-311) ASTHMA NOS W/ACUTE EXACERBATION (EAV-409.81)  Past Surgical History: Last updated: 04/01/2010 Cholecystectomy Lumpectomy Tubal ligation-Bilat Cath 09/05/06 Knee Arthroplasty-Total...right..02/28/10.Marland KitchenMarland KitchenOllen Gross, MD  Family History: Last updated: 08/30/2009 Family History of Melanoma Family History of Cardiovascular disorder..mother started havin heart attacks @ 58, lived into her 73's. daughter died @ 58 metastatic melanoma Family History of Breast Cancer:sister  Social History: Last updated: 08/30/2009 Occupation: RN Married Never Smoked Alcohol use-yes 5 per yea rare Daily Caffeine Use one pre week Illicit Drug Use - no  Risk Factors: Smoking Status: never (01/24/2007)  Review of Systems       negative other than history of present illness  Vital Signs:  Patient profile:   65 year old female Menstrual status:  postmenopausal Height:      59 inches Weight:      161.8 pounds BMI:     32.80 Pulse rate:   78 / minute Pulse rhythm:   irregular BP sitting:   122 / 68  (left arm) Cuff size:   large  Vitals Entered By: Danielle Rankin,  CMA (April 04, 2010 11:18 AM)  Physical Exam  General:  obese.  no acute distress Head:  normocephalic and atraumatic Eyes:  PERRLA/EOM intact; conjunctiva and lids normal. Neck:  Neck supple, no JVD. No masses, thyromegaly or abnormal cervical nodes. Lungs:  Clear bilaterally to auscultation and percussion. Heart:  PMI nondisplaced, regular rate and rhythm, no Msk:  decreased ROM.   Pulses:  pulses normal in all 4 extremities Extremities:  1+ left pedal edema and 1+ right pedal edema.   Neurologic:  Alert and oriented x 3. Skin:  Intact without lesions or rashes. Psych:  Normal affect.   EKG  Procedure date:  02/17/2010  Findings:      normal sinus rhythm, old inferior wall infarct, no change.  Impression & Recommendations:  Problem # 1:  CAD, NATIVE VESSEL (ICD-414.01) Assessment Unchanged  Her updated medication list for this problem includes:    Aspirin Ec 325 Mg Tbec (Aspirin) .Marland Kitchen... Take one tablet by mouth daily    Nitrostat 0.4 Mg Subl (Nitroglycerin) .Marland Kitchen... 1 tablet under tongue at onset of chest pain; you may repeat every 5 minutes for up to 3 doses.    Cardizem Cd 180 Mg Cp24 (Diltiazem hcl coated beads) ..... One daily  Orders: EKG w/ Interpretation (93000)  Problem # 2:  MYOCARDIAL INFARCTION, HX OF (ICD-412) Assessment: Unchanged  Her updated medication list for this problem includes:    Aspirin Ec 325 Mg Tbec (Aspirin) .Marland Kitchen... Take one tablet by mouth daily    Nitrostat 0.4 Mg Subl (Nitroglycerin) .Marland Kitchen... 1 tablet under tongue at onset of chest pain; you may repeat every 5 minutes for up to 3 doses.    Cardizem Cd 180 Mg Cp24 (Diltiazem hcl coated beads) ..... One daily  Patient Instructions: 1)  Your physician recommends that you schedule a follow-up appointment in: 1 year with Dr. Daleen Squibb 2)  Your physician recommends that you continue on your current medications as directed. Please refer to the Current Medication list given to you today.

## 2010-08-16 NOTE — Letter (Signed)
Summary: Request for Surgical Clearance/[facility]  Request for Surgical Clearance/ Orthopaedics   Imported By: Maryln Gottron 12/29/2009 09:29:09  _____________________________________________________________________  External Attachment:    Type:   Image     Comment:   External Document

## 2010-08-16 NOTE — Progress Notes (Signed)
Summary: Pt needs new scripts written for all meds.Changing mail order rx  Phone Note Call from Patient Call back at [facility] Phone [phone]   Caller: Patient Summary of Call: Pt called and said that she needs to get all new scripts for all her meds, because she had to change mail order pharmacys. Pls write scripts and pt will pick up when ready.  Initial call taken by: Lucy Antigua,  June 15, 2010 8:28 AM    Prescriptions: LIPITOR 80 MG TABS (ATORVASTATIN CALCIUM) take one by mouth at bedtime(ON HOLD)  #100 x 3   Entered by:   Kern Reap CMA (AAMA)   Authorized by:   Roderick Pee MD   Signed by:   Kern Reap CMA (AAMA) on 06/15/2010   Method used:   Print then Give to Patient   RxID:   [id number] KLOR-CON M20 20 MEQ CR-TABS (POTASSIUM CHLORIDE CRYS CR) 1 AS NEEDED WITH LASIX  #100 x 3   Entered by:   Kern Reap CMA (AAMA)   Authorized by:   Roderick Pee MD   Signed by:   Kern Reap CMA (AAMA) on 06/15/2010   Method used:   Print then Give to Patient   RxID:   [id number] FUROSEMIDE 20 MG TABS (FUROSEMIDE) 1 AS NEEDED  FOR SWELLING  #100 x 3   Entered by:   Kern Reap CMA (AAMA)   Authorized by:   Roderick Pee MD   Signed by:   Kern Reap CMA (AAMA) on 06/15/2010   Method used:   Print then Give to Patient   RxID:   [id number] SYNTHROID 50 MCG TABS (LEVOTHYROXINE SODIUM) Take 1 tablet by mouth every morning  #100 x 3   Entered by:   Kern Reap CMA (AAMA)   Authorized by:   Roderick Pee MD   Signed by:   Kern Reap CMA (AAMA) on 06/15/2010   Method used:   Print then Give to Patient   RxID:   [id number] CARDIZEM CD 180 MG  CP24 (DILTIAZEM HCL COATED BEADS) one daily  #100 x 3   Entered by:   Kern Reap CMA (AAMA)   Authorized by:   Roderick Pee MD   Signed by:   Kern Reap CMA (AAMA) on 06/15/2010   Method used:   Print then Give to Patient   RxID:   [id number]

## 2010-08-16 NOTE — Progress Notes (Signed)
Summary: refill request  Phone Note From Pharmacy   Summary of Call: patient would like a refill of benzonate is this okay to fill? Initial call taken by: Kern Reap CMA Duncan Dull),  January 28, 2010 10:33 AM  Follow-up for Phone Call        okay dispense 50 tablets to use as directed, refill x 3 Follow-up by: Roderick Pee MD,  January 28, 2010 10:53 AM    New/Updated Medications: BENZONATATE 100 MG CAPS (BENZONATATE) take one tab by mouth four times a day Prescriptions: BENZONATATE 100 MG CAPS (BENZONATATE) take one tab by mouth four times a day  #50 x 3   Entered by:   Kern Reap CMA (AAMA)   Authorized by:   Roderick Pee MD   Signed by:   Kern Reap CMA (AAMA) on 01/28/2010   Method used:   Electronically to        [facility] [address] (retail)       [phone]       [place], [place]  [address]       Ph: [phone]       Fax: [phone]   RxID:   [id number]

## 2010-08-16 NOTE — Assessment & Plan Note (Signed)
Summary: REFLUX ESOPHAGITIS/YF   History of Present Illness Visit Type: Initial Consult Primary GI MD: Yancey Flemings MD Primary Provider: Roderick Pee MD Requesting Provider: Roderick Pee MD Chief Complaint: Patient complain that she started with a cough around Christmas, since then she has had chest pain. She now is thinking after changing her inhailer with no improvement that she is having increased reflux. She has increased her prilosec to twice a day and still can not tell a difference. She is having some epigastric pain that radiates to her back.  History of Present Illness:   Ms. Popiel was followed by Dr. Victorino Dike for twenty years for GERD, IBS, intermittent epigastric pain, and colon polyps. She has a history of CAD / stent (2008). Patient was hospitalized in the fall with non-cardiac chest pain.  She has a history of asthma and since late December has battled a cough.  PCP has treated her with steroids and inhalers which have helped. She is here with a ten day history of chest pain which feels like the pain preceding her cholecystectomy 15 years ago. Pain not exertional. No SOB. Ntroglycerin doesn't help. PCP thought she may have musculoskeletal pain from coughing or pain related to GERD.  Pain radiates through to upper mid back. It is constant, not related to meals. Patient started Nexium which she takes as needed for heartburn. Saw PCP, CBC and LFTs normal, did EKG (I don't have results). He changed her to Prilosec twice daily and referred her here. High dose PPI not helping.     History of melanoma right thigh, daughter died at 48 years of age with melanoma.    GI Review of Systems    Reports acid reflux and  chest pain.      Denies abdominal pain, belching, bloating, dysphagia with liquids, dysphagia with solids, heartburn, loss of appetite, vomiting, vomiting blood, weight loss, and  weight gain.        Denies anal fissure, black tarry stools, change in bowel habit,  constipation, diarrhea, diverticulosis, fecal incontinence, heme positive stool, hemorrhoids, irritable bowel syndrome, jaundice, light color stool, liver problems, rectal bleeding, and  rectal pain. Preventive Screening-Counseling & Management      Drug Use:  no.      Current Medications (verified): 1)  Aspirin Ec 325 Mg Tbec (Aspirin) .... Take One Tablet By Mouth Daily 2)  Premarin 0.625 Mg/gm  Crea (Estrogens, Conjugated) .Marland Kitchen.. 1 App. Per Week 3)  Nasonex 50 Mcg/act  Susp (Mometasone Furoate) .... Prn 4)  Prilosec 20 Mg Cpdr (Omeprazole) .... Take One By Mouth Two Times A Day 5)  Nitroquick 0.4 Mg  Subl (Nitroglycerin) .... As Needed 6)  Vitamin D 2000 Unit Tabs (Cholecalciferol) .Marland Kitchen.. 1 Tab Once Daily 7)  Calcium 600 Mg Tabs (Calcium) .Marland Kitchen.. 1 Tab Two Times A Day 8)  Systane 0.4-0.3 % Soln (Polyethyl Glycol-Propyl Glycol) .... Use As Directed 9)  Cardizem Cd 180 Mg  Cp24 (Diltiazem Hcl Coated Beads) .... One Daily 10)  Salex 6 % (Cream) Kit (Salicylic Acid-Cleanser) .... Apply To Feet As Needed 11)  Zyrtec Allergy 10 Mg Tabs (Cetirizine Hcl) .... Take One Tab At Bedtime As Needed 12)  Fish Oil 1200 Mg Caps (Omega-3 Fatty Acids) .... Take One Tab Two Times A Day 13)  Synthroid 50 Mcg Tabs (Levothyroxine Sodium) .... Take 1 Tablet By Mouth Every Morning 14)  Furosemide 20 Mg Tabs (Furosemide) .Marland Kitchen.. 1 As Needed  For Swelling 15)  Klor-Con M20 20 Meq Cr-Tabs (  Potassium Chloride Crys Cr) .Marland Kitchen.. 1 As Needed With Lasix 16)  Ambien 10 Mg Tabs (Zolpidem Tartrate) .Marland Kitchen.. 1 Tab @ Bedtime As Needed 17)  Qvar 40 Mcg/act Aers (Beclomethasone Dipropionate) .Marland Kitchen.. 1 Puff Two Times A Day 18)  Lipitor 80 Mg Tabs (Atorvastatin Calcium) .... Take One By Mouth At Bedtime  Allergies: 1)  ! * Ivp Dye 2)  ! Sulfa 3)  ! Bentyl 4)  ! Codeine 5)  ! Hydrocodone  Past History:  Past Medical History: Coronary artery disease Depression GERD Elevated cholesterol Adult acne Colonic polyps, hx of Myocardial  infarction, hx of Osteoarthritis Skin cancer, hx of CAD G E R D Hyperlipidemia Myocardial Infarction Hiatal hernia sleep apena  Past Surgical History: Reviewed history from 07/30/2008 and no changes required. Cholecystectomy Lumpectomy Tubal ligation-Bilat Cath 09/05/06  Family History: Family History of Melanoma Family History of Cardiovascular disorder..mother started havin heart attacks @ 95, lived into her 26's. daughter died @ 77 metastatic melanoma Family History of Breast Cancer:sister  Social History: Occupation: Charity fundraiser Married Never Smoked Alcohol use-yes 5 per yea rare Daily Caffeine Use one pre week Illicit Drug Use - no Drug Use:  no  Review of Systems       The patient complains of allergy/sinus, anxiety-new, arthritis/joint pain, change in vision, cough, fatigue, headaches-new, sleeping problems, and swelling of feet/legs.  The patient denies anemia, back pain, blood in urine, breast changes/lumps, confusion, coughing up blood, depression-new, fainting, fever, hearing problems, heart murmur, heart rhythm changes, itching, menstrual pain, muscle pains/cramps, night sweats, nosebleeds, pregnancy symptoms, shortness of breath, skin rash, sore throat, swollen lymph glands, thirst - excessive , urination - excessive , urination changes/pain, urine leakage, vision changes, and voice change.    Vital Signs:  Patient profile:   65 year old female Menstrual status:  postmenopausal Height:      59 inches Weight:      162.2 pounds BMI:     32.88 Pulse rate:   72 / minute Pulse rhythm:   regular BP sitting:   120 / 70  (left arm) Cuff size:   regular  Vitals Entered By: Harlow Mares CMA Duncan Dull) (August 30, 2009 9:10 AM)  Physical Exam  General:  Well developed, well nourished, no acute distress. Head:  Normocephalic and atraumatic. Eyes:  Conjunctiva pink, no icterus.  Mouth:  No oral lesions. Tongue moist.  Neck:  no obvious masses  Lungs:  Clear throughout  to auscultation. Heart:  Regular rate and rhythm; no murmurs, rubs,  or bruits. Abdomen:  Abdomen soft, nontender, nondistended. No obvious masses or hepatomegaly.Normal bowel sounds.  Msk:  Symmetrical with no gross deformities. Normal posture. Extremities:  No palmar erythema, no edema.  Neurologic:  Alert and  oriented x4;  grossly normal neurologically. Skin:  Intact without significant lesions or rashes. Cervical Nodes:  No significant cervical adenopathy. Psych:  Alert and cooperative. Normal mood and affect.   Impression & Recommendations:  Problem # 1:  CHEST PAIN, ATYPICAL (ICD-786.59) Assessment Deteriorated Pain doesn't really sound cardiac in nature, her last cardiac cath was in Sept. 2010 (see #3).  Pain could be musculoskeletal related to recent coughing. Although her pain doesn't seem GI in nature, upper endoscopy would exclude any esophageal lesions as cause for her pain. The patient will be scheduled for an EGD with biopsies/ esophageal dilation ( if indicated).  The risks and benefits of the procedure, as well as alternatives were discussed with the patient and she agrees to proceed.  Orders: Colon/Endo (  Colon/Endo)  Problem # 2:  COLONIC POLYPS, HX OF (ICD-V12.72) Hyperplastic polyp year 2000.  Last colonoscopy 2004, no polyps.  We sent patient a colonoscopy recall letter. She will be scheduled for a colonoscopy with biopsies/polypectomy (if indicated).  The risks and benefits of the procedure, as well as alternatives were discussed with the patient and she agrees to proceed.  Orders: Colon/Endo (Colon/Endo)  Problem # 3:  CORONARY ARTERY DISEASE (ICD-414.00) She's had a previous inferior wall infarct in 1992 treated medically. She is a bare metal stent the right coronary artery in 2008. Her Sept. 2010 catheterization showed a 40% stenosis in the LAD, 40-50% in-stent restenosis in the right coronary artery, normal left ventricular function with an EF of  55-60%.  Problem # 4:  SKIN CANCER, HX OF (ICD-V10.83) Assessment: Comment Only Hx. of melanoma  Patient Instructions: 1)  [place] the Endoscopy/Colonoscopy with Dr. Fransico Setters for 08-31-09. 2)  Instructions and Colonoscopy/Endoscopy brochures provided. 3)  Colonoscopy preperation perscription sent to your pharmacy, [address]  Prescriptions: MOVIPREP 100 GM  SOLR (PEG-KCL-NACL-NASULF-NA ASC-C) As per prep instructions.  #1 x 0   Entered by:   Lowry Ram NCMA   Authorized by:   Willette Cluster NP   Signed by:   Lowry Ram NCMA on 08/30/2009   Method used:   Electronically to        [facility] [address] (retail)       [phone]       [place], [place]  [address]       Ph: [phone]       Fax: [phone]   RxID:   [id number]

## 2010-08-16 NOTE — Assessment & Plan Note (Signed)
Summary: COUGH, CONGESTION // RS   Vital Signs:  Patient profile:   65 year old female Menstrual status:  postmenopausal Weight:      161 pounds Temp:     97.5 degrees F oral BP sitting:   124 / 80  (left arm) Cuff size:   regular  Vitals Entered By: Kern Reap CMA Duncan Dull) (August 02, 2009 12:25 PM)  Reason for Visit head and chest congestion  Primary Care Provider:  Roderick Pee MD   History of Present Illness:  Rebecca [name] is a 65 year old, married female, nonsmoker, who comes in today with a flare of her asthma and allergies for the past 6 weeks.  She's tried Zyrtec Proventil stearate, nasal spray, without any improvement in her allergy or asthma.  Symptoms.  About a week ago.  Her cough became worse.  She feels, like she's now wheezing, more.  She stop the Zyrtec and took over-the-counter Mucinex D..  Review of systems negative  Allergies: 1)  ! * Ivp Dye 2)  ! Sulfa 3)  ! Bentyl  Past History:  Past medical, surgical, family and social histories (including risk factors) reviewed for relevance to current acute and chronic problems.  Past Medical History: Reviewed history from 07/30/2008 and no changes required. Coronary artery disease Depression GERD Elevated cholesterol Adult acne Colonic polyps, hx of Myocardial infarction, hx of Osteoarthritis Skin cancer, hx of CAD G E R D Hyperlipidemia Myocardial Infarction  Past Surgical History: Reviewed history from 07/30/2008 and no changes required. Cholecystectomy Lumpectomy Tubal ligation-Bilat Cath 09/05/06  Family History: Reviewed history from 07/30/2008 and no changes required. Family History of Melanoma Family History of Cardiovascular disorder..mother started havin heart attacks @ 59, lived into her 49's. daughter died @ 73 metastatic melanoma  Social History: Reviewed history from 01/24/2007 and no changes required. Occupation: Charity fundraiser Married Never Smoked Alcohol use-no  Review of Systems  See HPI  Physical Exam  General:  Well-developed,well-nourished,in no acute distress; alert,appropriate and cooperative throughout examination Head:  Normocephalic and atraumatic without obvious abnormalities. No apparent alopecia or balding. Eyes:  No corneal or conjunctival inflammation noted. EOMI. Perrla. Funduscopic exam benign, without hemorrhages, exudates or papilledema. Vision grossly normal. Ears:  External ear exam shows no significant lesions or deformities.  Otoscopic examination reveals clear canals, tympanic membranes are intact bilaterally without bulging, retraction, inflammation or discharge. Hearing is grossly normal bilaterally. Nose:  External nasal examination shows no deformity or inflammation. Nasal mucosa are pink and moist without lesions or exudates. Mouth:  Oral mucosa and oropharynx without lesions or exudates.  Teeth in good repair. Neck:  No deformities, masses, or tenderness noted. Chest Wall:  No deformities, masses, or tenderness noted. Lungs:  symmetrical breath sounds bilateral expiratory wheezing   Problems:  Medical Problems Added: 1)  Dx of Asthma Nos W/acute Exacerbation  (ZOX-096.04)  Impression & Recommendations:  Problem # 1:  ASTHMA NOS W/ACUTE EXACERBATION (ICD-493.92) Assessment Deteriorated  The following medications were removed from the medication list:    Prednisone 20 Mg Tabs (Prednisone) ..... Uad Her updated medication list for this problem includes:    Proventil Hfa 108 (90 Base) Mcg/act Aers (Albuterol sulfate) .Marland Kitchen... 2 ps three times a day    Prednisone 20 Mg Tabs (Prednisone) ..... Uad  Orders: Prescription Created Electronically [phone])  Complete Medication List: 1)  Aspirin Ec 325 Mg Tbec (Aspirin) .... Take one tablet by mouth daily 2)  Premarin 0.625 Mg/gm Crea (Estrogens, conjugated) .Marland Kitchen.. 1 app. per week 3)  Nasonex 50  Mcg/act Susp (Mometasone furoate) .... Prn 4)  Nexium 40 Mg Pack (Esomeprazole magnesium) .... Once  daily 5)  Nitroquick 0.4 Mg Subl (Nitroglycerin) .... As needed 6)  Vitamin D 2000 Unit Tabs (Cholecalciferol) .Marland Kitchen.. 1 tab once daily 7)  Calcium 600 Mg Tabs (Calcium) .Marland Kitchen.. 1 tab two times a day 8)  Systane 0.4-0.3 % Soln (Polyethyl glycol-propyl glycol) .... Use as directed 9)  Cardizem Cd 180 Mg Cp24 (Diltiazem hcl coated beads) .... One daily 10)  Lipitor 80 Mg Tabs (Atorvastatin calcium) .... Take one tab by mouth at bedtime 11)  Meclizine Hcl 25 Mg Tabs (Meclizine hcl) .... Take one tab as needed 12)  Proventil Hfa 108 (90 Base) Mcg/act Aers (Albuterol sulfate) .... 2 ps three times a day 13)  Salex 6 % (cream) Kit (Salicylic acid-cleanser) .... Apply to feet as needed 14)  Zyrtec Allergy 10 Mg Tabs (Cetirizine hcl) .... Take one tab at bedtime as needed 15)  Fish Oil 1200 Mg Caps (Omega-3 fatty acids) .... Take one tab two times a day 16)  Synthroid 50 Mcg Tabs (Levothyroxine sodium) .... Take 1 tablet by mouth every morning 17)  Furosemide 20 Mg Tabs (Furosemide) .Marland Kitchen.. 1 as needed  for swelling 18)  Klor-con M20 20 Meq Cr-tabs (Potassium chloride crys cr) .Marland Kitchen.. 1 as needed with lasix 19)  Prednisone 20 Mg Tabs (Prednisone) .... Uad 20)  Ambien 10 Mg Tabs (Zolpidem tartrate) .Marland Kitchen.. 1 tab @ bedtime as needed  Patient Instructions: 1)  drink 30 ounces of water daily, run a vaporizer or humidifier in y  bedroom at night. 2)  Begin prednisone two tabs x 3 days, one x 3 days, a half x 3 days, then half a tablet Monday, Wednesday, Friday, for a two to 3-week taper. 3)  Stop Mucinex D. 4)  You can hold Zyrtec, and a steroid nasal spray, why you're on the oral steroids.  When you taper off the oral steroids, then resume Zyrtec daily, along with the steroid nasal spray. 5)  Proventil is one to two puffs 3 times a day as needed. 6)  u  may take a half of a 10-mg Ambien if the prednisone causes sleep dysfunction Prescriptions: AMBIEN 10 MG TABS (ZOLPIDEM TARTRATE) 1 tab @ bedtime as needed  #30 x 2    Entered and Authorized by:   Roderick Pee MD   Signed by:   Roderick Pee MD on 08/02/2009   Method used:   Print then Give to Patient   RxID:   [id number] PROVENTIL HFA 108 (90 BASE) MCG/ACT AERS (ALBUTEROL SULFATE) 2 ps three times a day  #1 x 1   Entered and Authorized by:   Roderick Pee MD   Signed by:   Roderick Pee MD on 08/02/2009   Method used:   Electronically to        [facility] [address] (retail)       [phone]       [place], [place]  [address]       Ph: [phone]       Fax: [phone]   RxID:   [id number] PREDNISONE 20 MG TABS (PREDNISONE) UAD  #50 x 1   Entered and Authorized by:   Roderick Pee MD   Signed by:   Roderick Pee MD on 08/02/2009   Method used:   Electronically to        [facility]  [address] (retail)       [phone]       [place], [place]  [address]       Ph: [phone]       Fax: [phone]   RxID:   [id number]

## 2010-08-16 NOTE — Miscellaneous (Signed)
Summary: Orders Update/clotest  Clinical Lists Changes  Orders: Added new Test order of TLB-H Pylori Screen Gastric Biopsy (83013-CLOTEST) - Signed 

## 2010-08-16 NOTE — Assessment & Plan Note (Signed)
Vital Signs:  Patient profile:   65 year old female Menstrual status:  postmenopausal Weight:      163 pounds Temp:     97.7 degrees F oral BP sitting:   168 / 98  (left arm) Cuff size:   regular  Vitals Entered By: Kern Reap CMA Duncan Dull) (August 23, 2009 9:15 AM)  Reason for Visit chest pain  Primary Care Provider:  Roderick Pee MD   History of Present Illness: Rebecca [name] is a 65 year old, married female, nonsmoker, who comes in today for evaluation of 3 problems.  She's had a flare of her asthma.  10 days ago.  She finished her prednisone.  Tapering dose was 10 mg 3 times a week x 2 weeks.  She states she feels 90% better, but still is wheezing a little bit.  She is to use one or two puffs of albuterol nightly.  She's also having diffuse chest pain from the coughing.  No exertional pain.  No angina.  She had a cardiac catheter last fall, which was normal.  She also has fatigue tired no energy.  Diffuse muscle aches, etiology unknown  Allergies: 1)  ! * Ivp Dye 2)  ! Sulfa 3)  ! Bentyl  Past History:  Past medical, surgical, family and social histories (including risk factors) reviewed, and no changes noted (except as noted below).  Past Medical History: Reviewed history from 07/30/2008 and no changes required. Coronary artery disease Depression GERD Elevated cholesterol Adult acne Colonic polyps, hx of Myocardial infarction, hx of Osteoarthritis Skin cancer, hx of CAD G E R D Hyperlipidemia Myocardial Infarction  Past Surgical History: Reviewed history from 07/30/2008 and no changes required. Cholecystectomy Lumpectomy Tubal ligation-Bilat Cath 09/05/06  Family History: Reviewed history from 07/30/2008 and no changes required. Family History of Melanoma Family History of Cardiovascular disorder..mother started havin heart attacks @ 68, lived into her 33's. daughter died @ 31 metastatic melanoma  Social History: Reviewed history from 01/24/2007  and no changes required. 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 Lungs:  Normal respiratory effort, chest expands symmetrically. Lungs are clear to auscultation, no crackles..........late expiratory wheezing Heart:  Normal rate and regular rhythm. S1 and S2 normal without gallop, murmur, click, rub or other extra sounds.   Impression & Recommendations:  Problem # 1:  ASTHMA NOS W/ACUTE EXACERBATION (ICD-493.92) Assessment Improved  Her updated medication list for this problem includes:    Proventil Hfa 108 (90 Base) Mcg/act Aers (Albuterol sulfate) .Marland Kitchen... 2 ps three times a day    Prednisone 20 Mg Tabs (Prednisone) ..... Uad    Qvar 40 Mcg/act Aers (Beclomethasone dipropionate) .Marland Kitchen... 1 puff two times a day  Orders: Venipuncture (16109) Prescription Created Electronically [id number]) TLB-BMP (Basic Metabolic Panel-BMET) (80048-METABOL) TLB-TSH (Thyroid Stimulating Hormone) (84443-TSH) TLB-CBC Platelet - w/Differential (85025-CBCD) TLB-Hepatic/Liver Function Pnl (80076-HEPATIC)  Problem # 2:  CHEST PAIN, ATYPICAL (ICD-786.59) Assessment: New  Orders: Venipuncture (09811) Prescription Created Electronically [phone]) TLB-BMP (Basic Metabolic Panel-BMET) (80048-METABOL) TLB-TSH (Thyroid Stimulating Hormone) (84443-TSH) TLB-CBC Platelet - w/Differential (85025-CBCD) TLB-Hepatic/Liver Function Pnl (80076-HEPATIC)  Problem # 3:  FATIGUE (ICD-780.79) Assessment: New  Orders: Venipuncture (29562) Prescription Created Electronically [phone]) TLB-BMP (Basic Metabolic Panel-BMET) (80048-METABOL) TLB-TSH (Thyroid Stimulating Hormone) (84443-TSH) TLB-CBC Platelet - w/Differential (85025-CBCD) TLB-Hepatic/Liver Function Pnl (80076-HEPATIC)  Complete  Medication List:  1)  Aspirin Ec 325 Mg Tbec (Aspirin) .... Take one tablet by mouth daily 2)  Premarin 0.625 Mg/gm Crea (Estrogens, conjugated) .Marland Kitchen.. 1 app. per week 3)  Nasonex 50 Mcg/act Susp (Mometasone furoate) .... Prn 4)  Nexium 40 Mg Pack (Esomeprazole magnesium) .... Once daily 5)  Nitroquick 0.4 Mg Subl (Nitroglycerin) .... As needed 6)  Vitamin D 2000 Unit Tabs (Cholecalciferol) .Marland Kitchen.. 1 tab once daily 7)  Calcium 600 Mg Tabs (Calcium) .Marland Kitchen.. 1 tab two times a day 8)  Systane 0.4-0.3 % Soln (Polyethyl glycol-propyl glycol) .... Use as directed 9)  Cardizem Cd 180 Mg Cp24 (Diltiazem hcl coated beads) .... One daily 10)  Lipitor 80 Mg Tabs (Atorvastatin calcium) .... Take one tab by mouth at bedtime 11)  Meclizine Hcl 25 Mg Tabs (Meclizine hcl) .... Take one tab as needed 12)  Proventil Hfa 108 (90 Base) Mcg/act Aers (Albuterol sulfate) .... 2 ps three times a day 13)  Salex 6 % (cream) Kit (Salicylic acid-cleanser) .... Apply to feet as needed 14)  Zyrtec Allergy 10 Mg Tabs (Cetirizine hcl) .... Take one tab at bedtime as needed 15)  Fish Oil 1200 Mg Caps (Omega-3 fatty acids) .... Take one tab two times a day 16)  Synthroid 50 Mcg Tabs (Levothyroxine sodium) .... Take 1 tablet by mouth every morning 17)  Furosemide 20 Mg Tabs (Furosemide) .Marland Kitchen.. 1 as needed  for swelling 18)  Klor-con M20 20 Meq Cr-tabs (Potassium chloride crys cr) .Marland Kitchen.. 1 as needed with lasix 19)  Prednisone 20 Mg Tabs (Prednisone) .... Uad 20)  Ambien 10 Mg Tabs (Zolpidem tartrate) .Marland Kitchen.. 1 tab @ bedtime as needed 21)  Qvar 40 Mcg/act Aers (Beclomethasone dipropionate) .Marland Kitchen.. 1 puff two times a day  Other Orders: EKG w/ Interpretation (93000)  Patient Instructions: 1)  add Qvar one puff b.i.d. 2)  Hold the Lipitor. 3)  return  Thursday afternoon for follow-up Prescriptions: QVAR 40 MCG/ACT AERS (BECLOMETHASONE DIPROPIONATE) 1 puff two times a day  #2 x 3   Entered and Authorized by:   Roderick Pee MD   Signed by:    Roderick Pee MD on 08/23/2009   Method used:   Electronically to        [facility] [address] (retail)       [phone]       [place], [place]  [address]       Ph: [phone]       Fax: [phone]   RxID:   [id number]

## 2010-08-16 NOTE — Progress Notes (Signed)
Summary: refills  Phone Note From Pharmacy   Summary of Call: refill request  Initial call taken by: Kern Reap CMA Duncan Dull),  February 08, 2010 9:09 AM    New/Updated Medications: ALPRAZOLAM 0.5 MG TABS (ALPRAZOLAM) take one tab by mouth once daily MOBIC 15 MG TABS (MELOXICAM) take one tab by mouth once daily Prescriptions: ALPRAZOLAM 0.5 MG TABS (ALPRAZOLAM) take one tab by mouth once daily  #100 x 0   Entered by:   Kern Reap CMA (AAMA)   Authorized by:   Roderick Pee MD   Signed by:   Kern Reap CMA (AAMA) on 02/08/2010   Method used:   Telephoned to ...       [facility] [address] (retail)       [phone]       [place], [place]  [address]       Ph: [phone]       Fax: [phone]   RxID:   [id number] MOBIC 15 MG TABS (MELOXICAM) take one tab by mouth once daily  #100 x 0   Entered by:   Kern Reap CMA (AAMA)   Authorized by:   Roderick Pee MD   Signed by:   Kern Reap CMA (AAMA) on 02/08/2010   Method used:   Telephoned to ...       [facility] [address] (retail)       [phone]       [place], [place]  [address]       Ph: [phone]       Fax: [phone]   RxID:   [id number]

## 2010-08-16 NOTE — Medication Information (Signed)
Summary: Coverage Approval for Lipitor  Coverage Approval for Lipitor   Imported By: Maryln Gottron 09/22/2009 11:28:10  _____________________________________________________________________  External Attachment:    Type:   Image     Comment:   External Document

## 2010-08-17 NOTE — Op Note (Signed)
NAMESHAUNAE, SIELOFF               ACCOUNT NO.:  [id number]  MEDICAL RECORD NO.:  [id number]          PATIENT TYPE:  INP  LOCATION:  0002                         FACILITY:  [facility]  PHYSICIAN:  Ollen Gross, M.D.    DATE OF BIRTH:  [birth year]  DATE OF PROCEDURE:  08/08/2010 DATE OF DISCHARGE:                              OPERATIVE REPORT   PREOPERATIVE DIAGNOSIS:  Osteoarthritis of the left knee.  POSTOPERATIVE DIAGNOSIS:  Osteoarthritis of the left knee.  PROCEDURE:  Left total-knee arthroplasty.  SURGEON:  Ollen Gross, M.D.  ASSISTANT:  Rozell Searing, PA-C  ANESTHESIA:  General.  ESTIMATED BLOOD LOSS:  Minimal.  DRAINS:  Hemovac x 1.  TOURNIQUET TIME:  37 minutes at 300 mmHg.  COMPLICATIONS:  None.  CONDITION:  Stable to the recovery room.  BRIEF CLINICAL NOTE:  Ms. Gregg is a 65 year old female with advanced end-stage arthritis of the left knee with progressively worsening pain and dysfunction.  She has failed nonoperative management and presents for a total-knee arthroplasty.  PROCEDURE IN DETAIL:  After successful administration of general anesthetic, a tourniquet was placed high on the left thigh.  The left lower was extremity prepped and draped in the usual sterile fashion. Extremity was wrapped in Esmarch, knee flexed, tourniquet inflated to 300 mmHg.  Midline incision was made with a 10 blade through subcutaneous tissue to the extensor mechanism.  A fresh blade was used to make a medial parapatellar arthrotomy.  Soft tissue in the proximal and medial tibia was subperiosteally elevated to the joint line with the knife and into the semimembranosus bursa with a Cobb elevator.  Soft tissue laterally was elevated with attention being paid to avoid the patellar tendon on the tibial tubercle.  The patella was everted, knee flexed 90 degrees and ACL and PCL removed.  Drill was used to create a starting hole in the distal femur and the canal was  thoroughly irrigated.  The 5 degrees left valgus alignment guide was placed. Distal femoral cutting block was pinned to remove 10 mm off the distal femur.  The resection was made with an oscillating saw.  Tibia subluxed forward and the menisci were removed.  Extramedullary tibial alignment guide was placed referencing proximally at the medial aspect of the tibial tubercle and distally along the second metatarsal axis and tibial crest.  Block was pinned to remove 2 mm off the more deficient medial side.  Tibial resection was made with an oscillating saw.  Size 2.5 was the most appropriate tibial component and the proximal tibia was prepared with the modular drill and keel punch for the size 2.5.  Femoral sizing guide was placed, size 2.5 was the most appropriate. Rotation was marked off the epicondylar axis.  Size 2.5 cutting block was placed and the anterior, posterior and chamfer cuts were made. Intercondylar block was then placed and that cut was made.  Trial size 2.5 posterior stabilized femur was placed.  A 10-mm posterior stabilized rotating platform insert trial was placed.  With the 10, full extension was achieved with excellent varus-valgus and anterior-posterior balance throughout full range of motion.  The patella was everted and  thickness measured to be 22 mm.  Freehand resection was taken to 12 mm, 35 template was placed, lug holes were drilled, trial patella was placed and it tracks normally.  Osteophytes were removed off the posterior femur with the trial in place.  All trials were removed and the cut bone surfaces were prepared with pulsatile lavage.  Cement was mixed and once ready for implantation the size 2.5 mobile bearing tibial tray, size 2.5 posterior stabilized femur and 35 patella were cemented into place and the patella was held with a clamp.  Trial 10-mm insert was placed, knee held in full extension and all extruded cement was removed.  When the cement was  fully hardened, then the permanent 10 mm posterior stabilized rotating platform insert was placed in the tibial tray.  The wound was copiously irrigated with saline solution and the arthrotomy closed over a Hemovac drain with interrupted #1 PDS.  Flexion against gravity was 135 degrees and the patella tracks normally.  Tourniquet was released for a total time of 37 minutes.  Subcutaneous was closed with interrupted 2-0 Vicryl, subcuticular running 4-0 Monocryl.  The catheter for the Marcaine pain pump was placed and the pump was initiated. Incisions were cleaned and dried and Steri-Strips and a bulky sterile dressing were applied.  She was then placed into a knee immobilizer, awakened and transferred to recovery in stable condition.     Ollen Gross, M.D.     FA/MEDQ  D:  08/08/2010  T:  08/08/2010  Job:  [id number]  Electronically Signed by Ollen Gross M.D. on 08/17/2010 03:36:59 PM

## 2010-08-18 NOTE — Progress Notes (Signed)
  Stress,Cath faxed to [facility] @ [phone] [facility]  August 02, 2010 11:11 AM

## 2010-08-18 NOTE — Letter (Signed)
Summary: [facility] Surgical Clearance   [facility] Surgical Clearance   Imported By: Roderic Ovens 07/25/2010 14:25:25  _____________________________________________________________________  External Attachment:    Type:   Image     Comment:   External Document

## 2010-08-29 ENCOUNTER — Ambulatory Visit: Payer: Medicare Other | Attending: Orthopedic Surgery | Admitting: Physical Therapy

## 2010-08-29 DIAGNOSIS — Z96659 Presence of unspecified artificial knee joint: Secondary | ICD-10-CM | POA: Insufficient documentation

## 2010-08-29 DIAGNOSIS — M25669 Stiffness of unspecified knee, not elsewhere classified: Secondary | ICD-10-CM | POA: Insufficient documentation

## 2010-08-29 DIAGNOSIS — IMO0001 Reserved for inherently not codable concepts without codable children: Secondary | ICD-10-CM | POA: Insufficient documentation

## 2010-08-29 DIAGNOSIS — R262 Difficulty in walking, not elsewhere classified: Secondary | ICD-10-CM | POA: Insufficient documentation

## 2010-08-29 DIAGNOSIS — M25569 Pain in unspecified knee: Secondary | ICD-10-CM | POA: Insufficient documentation

## 2010-08-31 ENCOUNTER — Ambulatory Visit: Payer: Medicare Other | Admitting: Physical Therapy

## 2010-08-31 IMAGING — MG MM DIGITAL SCREENING
4 series · 4 of 4 positions shown · non-contrast
Comparison: none

DG SCREEN MAMMOGRAM BILATERAL
Bilateral CC and MLO view(s) were taken.

DIGITAL SCREENING MAMMOGRAM WITH CAD:
There are scattered fibroglandular densities.  No masses or malignant type calcifications are 
identified.  Compared with prior studies.
Images were processed with CAD.

[R CC]
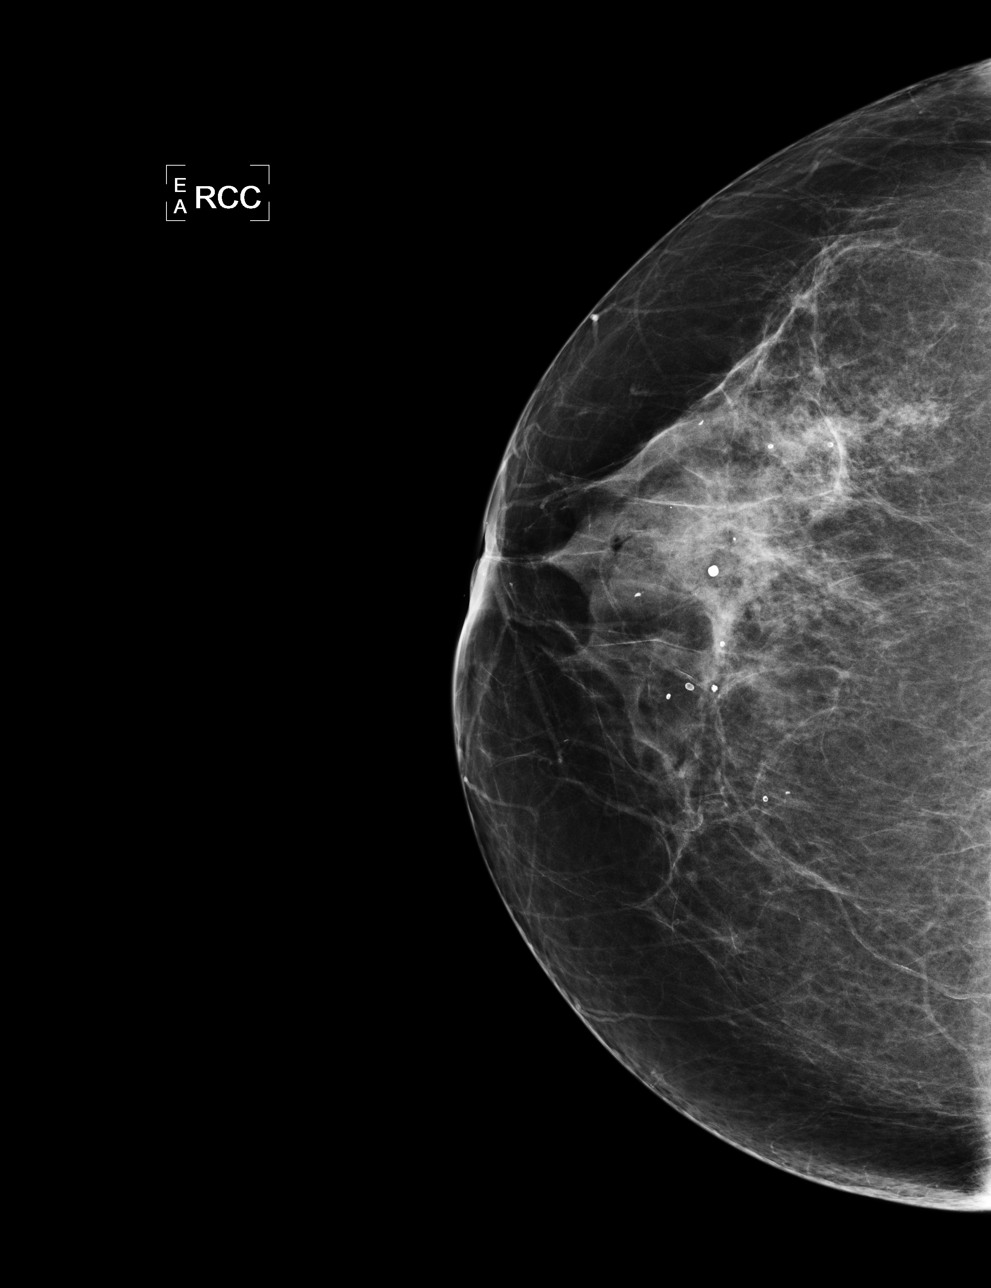

[L CC]
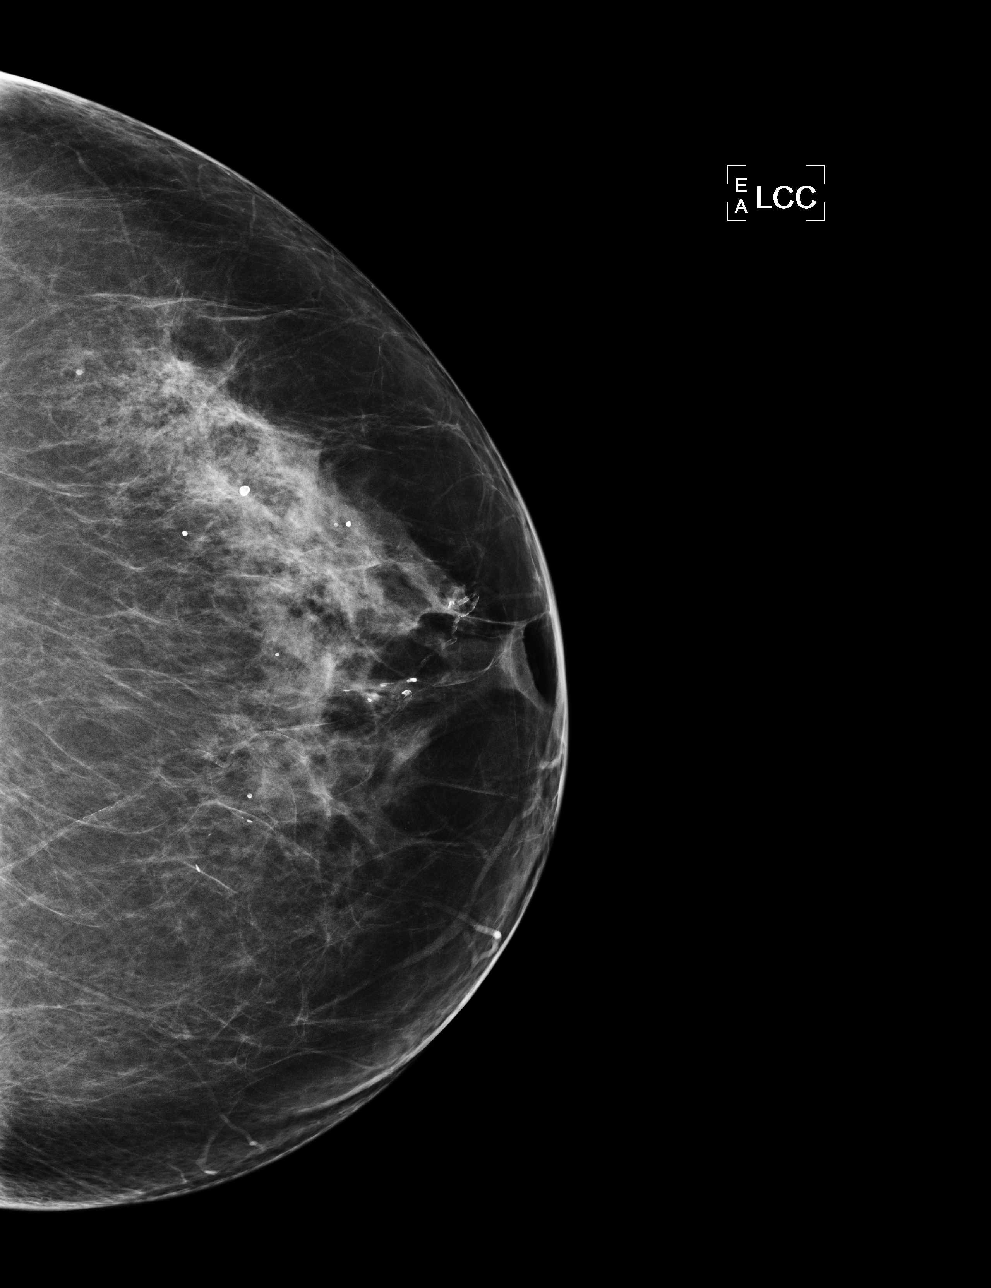

[L MLO]
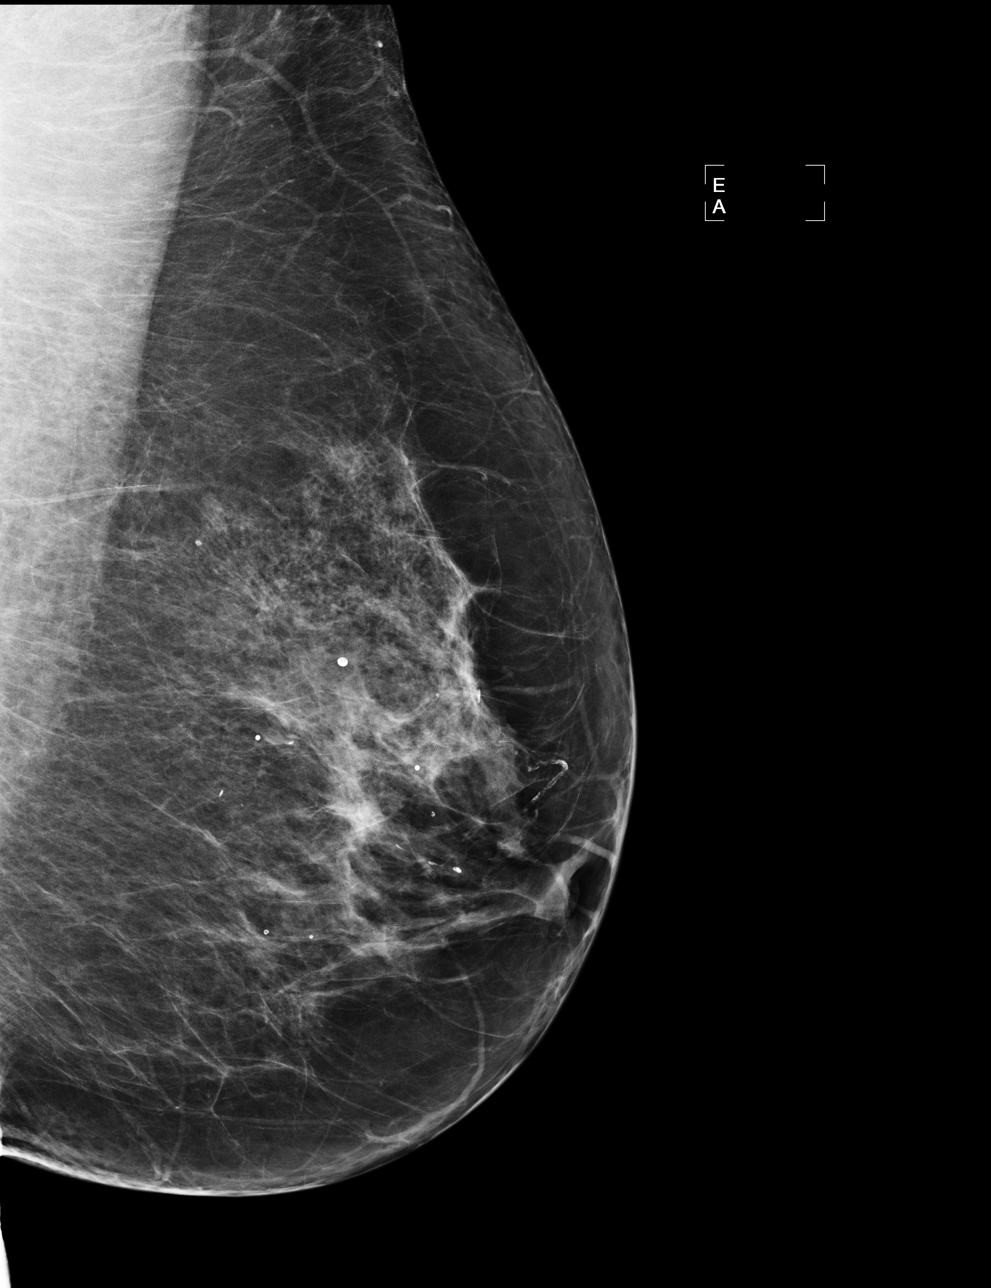

[R MLO]
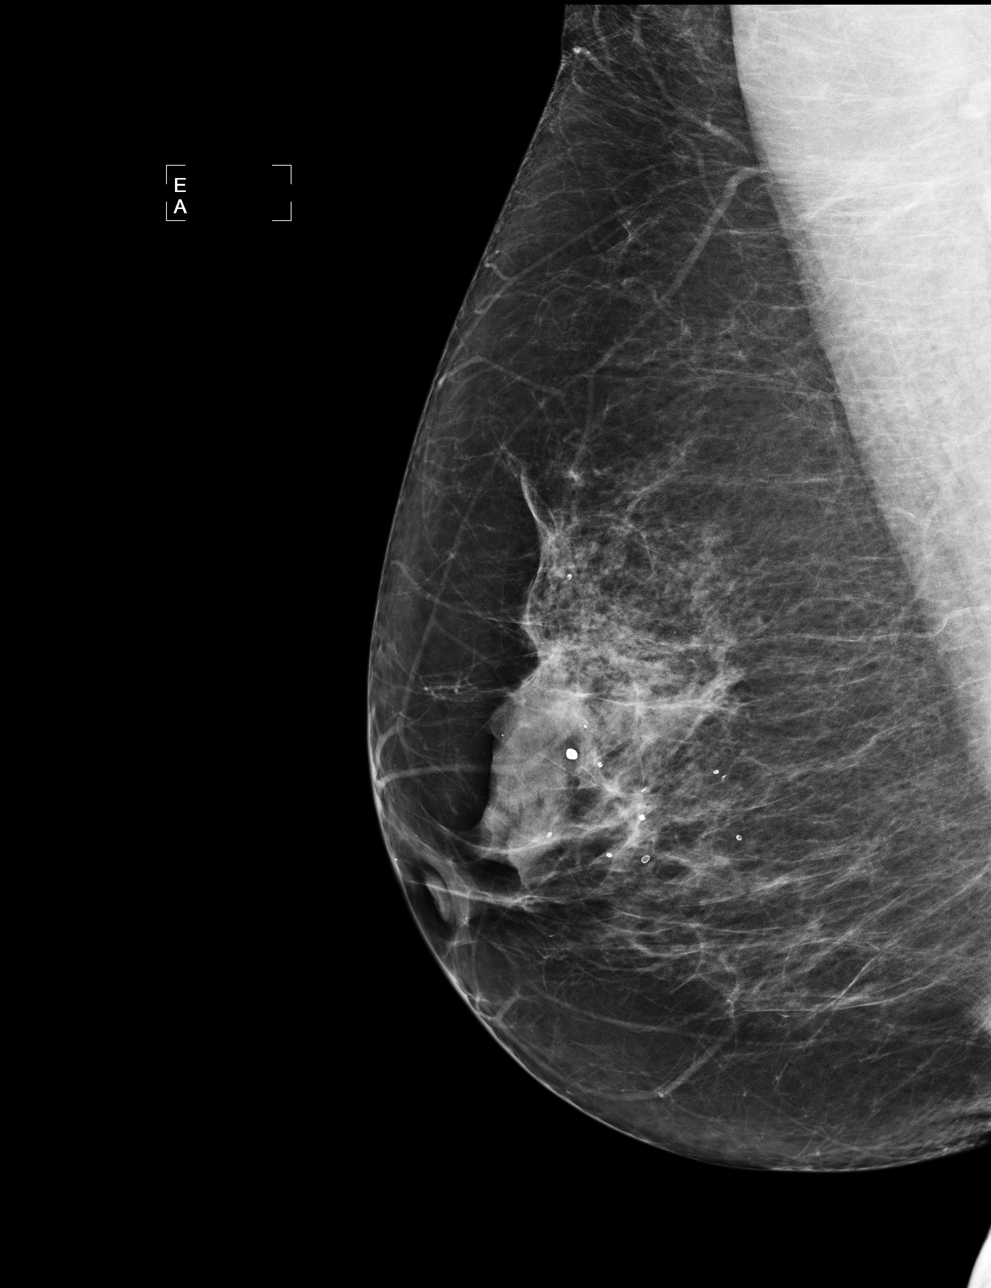

[4 of 4 positions shown; findings below may reference images not displayed]

IMPRESSION: No specific mammographic evidence of malignancy.  Next screening mammogram is recommended in one 
year.

A result letter of this screening mammogram will be mailed directly to the patient.

ASSESSMENT: Negative - BI-RADS 1

Screening mammogram in 1 year.
,

## 2010-09-02 ENCOUNTER — Ambulatory Visit: Payer: Medicare Other | Admitting: Physical Therapy

## 2010-09-05 ENCOUNTER — Ambulatory Visit: Payer: Medicare Other | Admitting: Physical Therapy

## 2010-09-07 ENCOUNTER — Ambulatory Visit: Payer: Medicare Other | Admitting: Physical Therapy

## 2010-09-09 ENCOUNTER — Ambulatory Visit: Payer: Medicare Other | Admitting: Physical Therapy

## 2010-09-12 ENCOUNTER — Ambulatory Visit: Payer: Medicare Other | Admitting: Physical Therapy

## 2010-09-14 ENCOUNTER — Ambulatory Visit: Payer: Medicare Other | Admitting: Physical Therapy

## 2010-09-16 ENCOUNTER — Ambulatory Visit: Payer: Medicare Other | Attending: Orthopedic Surgery | Admitting: Physical Therapy

## 2010-09-16 DIAGNOSIS — R262 Difficulty in walking, not elsewhere classified: Secondary | ICD-10-CM | POA: Insufficient documentation

## 2010-09-16 DIAGNOSIS — M25669 Stiffness of unspecified knee, not elsewhere classified: Secondary | ICD-10-CM | POA: Insufficient documentation

## 2010-09-16 DIAGNOSIS — IMO0001 Reserved for inherently not codable concepts without codable children: Secondary | ICD-10-CM | POA: Insufficient documentation

## 2010-09-16 DIAGNOSIS — M25569 Pain in unspecified knee: Secondary | ICD-10-CM | POA: Insufficient documentation

## 2010-09-16 DIAGNOSIS — Z96659 Presence of unspecified artificial knee joint: Secondary | ICD-10-CM | POA: Insufficient documentation

## 2010-09-19 ENCOUNTER — Ambulatory Visit: Payer: Medicare Other | Admitting: Physical Therapy

## 2010-09-22 ENCOUNTER — Ambulatory Visit: Payer: Medicare Other | Admitting: Physical Therapy

## 2010-09-26 ENCOUNTER — Other Ambulatory Visit: Payer: Self-pay | Admitting: Family Medicine

## 2010-09-26 DIAGNOSIS — Z1231 Encounter for screening mammogram for malignant neoplasm of breast: Secondary | ICD-10-CM

## 2010-09-27 ENCOUNTER — Ambulatory Visit: Payer: Medicare Other | Admitting: Physical Therapy

## 2010-09-29 ENCOUNTER — Ambulatory Visit: Payer: Medicare Other | Admitting: Physical Therapy

## 2010-09-30 LAB — PROTIME-INR
INR: 0.93 (ref 0.00–1.49)
INR: 1.4 (ref 0.00–1.49)
INR: 1.78 — ABNORMAL HIGH (ref 0.00–1.49)
Prothrombin Time: 12.7 seconds (ref 11.6–15.2)
Prothrombin Time: 17.4 seconds — ABNORMAL HIGH (ref 11.6–15.2)
Prothrombin Time: 20.9 seconds — ABNORMAL HIGH (ref 11.6–15.2)

## 2010-09-30 LAB — CBC
HCT: 29 % — ABNORMAL LOW (ref 36.0–46.0)
HCT: 30.3 % — ABNORMAL LOW (ref 36.0–46.0)
HCT: 37 % (ref 36.0–46.0)
Hemoglobin: 10.1 g/dL — ABNORMAL LOW (ref 12.0–15.0)
Hemoglobin: 10.4 g/dL — ABNORMAL LOW (ref 12.0–15.0)
Hemoglobin: 10.6 g/dL — ABNORMAL LOW (ref 12.0–15.0)
Hemoglobin: 12.6 g/dL (ref 12.0–15.0)
MCH: 31.3 pg (ref 26.0–34.0)
MCHC: 34.2 g/dL (ref 30.0–36.0)
MCHC: 35 g/dL (ref 30.0–36.0)
MCHC: 34.2 g/dL (ref 30.0–36.0)
Platelets: 243 10*3/uL (ref 150–400)
RBC: 3.4 MIL/uL — ABNORMAL LOW (ref 3.87–5.11)
RDW: 14.2 % (ref 11.5–15.5)
RDW: 14.3 % (ref 11.5–15.5)
WBC: 10.6 10*3/uL — ABNORMAL HIGH (ref 4.0–10.5)
WBC: 8.1 10*3/uL (ref 4.0–10.5)
WBC: 6.6 10*3/uL (ref 4.0–10.5)

## 2010-09-30 LAB — URINALYSIS, ROUTINE W REFLEX MICROSCOPIC
Glucose, UA: NEGATIVE mg/dL
Hgb urine dipstick: NEGATIVE
Specific Gravity, Urine: 1.016 (ref 1.005–1.030)
pH: 7.5 (ref 5.0–8.0)

## 2010-09-30 LAB — BASIC METABOLIC PANEL
Calcium: 9 mg/dL (ref 8.4–10.5)
Calcium: 9.1 mg/dL (ref 8.4–10.5)
Creatinine, Ser: 0.79 mg/dL (ref 0.4–1.2)
GFR calc Af Amer: >60 mL/min (ref >60)
GFR calc Af Amer: >60 mL/min (ref >60)
GFR calc non Af Amer: >60 mL/min (ref >60)
GFR calc non Af Amer: >60 mL/min (ref >60)
Potassium: 4.4 mEq/L (ref 3.5–5.1)
Sodium: 130 mEq/L — ABNORMAL LOW (ref 135–145)
Sodium: 132 mEq/L — ABNORMAL LOW (ref 135–145)

## 2010-09-30 LAB — TYPE AND SCREEN
ABO/RH(D): A NEG
Antibody Screen: NEGATIVE

## 2010-09-30 LAB — COMPREHENSIVE METABOLIC PANEL
ALT: 31 U/L (ref 0–35)
AST: 21 U/L (ref 0–37)
Alkaline Phosphatase: 69 U/L (ref 39–117)
Calcium: 9.1 mg/dL (ref 8.4–10.5)
GFR calc Af Amer: >60 mL/min (ref >60)
Potassium: 4.1 mEq/L (ref 3.5–5.1)
Sodium: 141 mEq/L (ref 135–145)
Total Protein: 5.8 g/dL — ABNORMAL LOW (ref 6.0–8.3)

## 2010-09-30 LAB — SURGICAL PCR SCREEN
MRSA, PCR: NEGATIVE
Staphylococcus aureus: NEGATIVE

## 2010-09-30 LAB — URINE MICROSCOPIC-ADD ON

## 2010-09-30 LAB — ABO/RH: ABO/RH(D): A NEG

## 2010-10-04 ENCOUNTER — Encounter: Payer: Self-pay | Admitting: Family Medicine

## 2010-10-04 ENCOUNTER — Ambulatory Visit: Payer: Medicare Other | Admitting: Physical Therapy

## 2010-10-04 ENCOUNTER — Other Ambulatory Visit: Payer: Self-pay | Admitting: Family Medicine

## 2010-10-04 ENCOUNTER — Ambulatory Visit (INDEPENDENT_AMBULATORY_CARE_PROVIDER_SITE_OTHER): Payer: Medicare Other | Admitting: Family Medicine

## 2010-10-04 VITALS — BP 130/74 | Temp 98.4°F | Ht 59.25 in | Wt 162.0 lb

## 2010-10-04 DIAGNOSIS — F5101 Primary insomnia: Secondary | ICD-10-CM

## 2010-10-04 DIAGNOSIS — G4700 Insomnia, unspecified: Secondary | ICD-10-CM

## 2010-10-04 MED ORDER — ZOLPIDEM TARTRATE 10 MG PO TABS: 10.0000 mg | ORAL_TABLET | Freq: Every evening | ORAL | Status: DC | PRN

## 2010-10-04 NOTE — Progress Notes (Signed)
  Subjective:    Patient ID: Rebecca [name], female    DOB: Apr 07, 1946, 65 y.o.   MRN: [id number]  HPI Rebecca [name] is a 65 year old female comes in today for evaluation of two problems.  Six weeks ago.  She had a left total knee replacement.  Since that, time.  She's noticed a little more swelling.  When she was in the hospital and subsequent as an outpatient.  She was given pain medicine and now she said total insomnia.  Again.  She is only sleeping about two to 3 hours a night.  She's tried all the non-medical treatment options.  However, nothing has helped.  She does have some Xanax, but she is afraid of taking on a regular basis   Review of Systems Neurologic and pulmonary review of systems otherwise negative    Objective:   Physical Exam    Well-developed well-nourished, female, in no acute distress.  1+ peripheral edema    Assessment & Plan:  Insomnia.........Marland Kitchen Restart Ambien taper off ASAP.  Fluid retention.........Marland Kitchen Double Lasix for a couple days

## 2010-10-04 NOTE — Patient Instructions (Signed)
Take 10 mg of Ambien at bedtime, and to sleep cycle, gets back in sync, then taper off the Ambien, slowly.  Double the Lasix for a day or two to get rid of the fluid retention

## 2010-10-05 NOTE — H&P (Signed)
  NAMEMARGORIE, RENNER               ACCOUNT NO.:  [id number]  MEDICAL RECORD NO.:  [id number]          PATIENT TYPE:  INP  LOCATION:  1615                         FACILITY:  [facility]  PHYSICIAN:  Ollen Gross, M.D.    DATE OF BIRTH:  [birth year]  DATE OF ADMISSION:  08/08/2010 DATE OF DISCHARGE:  08/11/2010                             HISTORY & PHYSICAL   CHIEF COMPLAINT:  Left knee pain.  HISTORY OF PRESENT ILLNESS:  The patient is a 65 year old female, known to Dr. Lequita Halt, with end-stage arthritis with left knee progressive worsening pain and dysfunction.  She has failed nonoperative management and now presents for a total knee arthroplasty.  ALLERGIES:  Sulfa.  CURRENT MEDICATIONS:  Zyrtec, Tylenol, Premarin, nitroglycerin, Nasonex, levothyroxine, Xanax, Lipitor, calcium, omeprazole, fish oil, vitamin D, multivitamin, aspirin, potassium chloride, furosemide, diltiazem.  PAST MEDICAL HISTORY: 1. Impaired hearing. 2. Tinnitus. 3. Cataracts. 4. Hypertension. 5. History of myocardial infarction. 6. Coronary arterial disease. 7. Hiatal hernia. 8. Reflux disease. 9. Irritable bowel syndrome. 10.Hypothyroidism. 11.Osteoarthritis. 12.Postmenopausal.  PAST SURGICAL HISTORY:  Exploratory abdominal surgery; gallbladder surgery; melanoma, right thigh, excision; cardiac catheterization with stent placement; and also right total knee arthroplasty.  SOCIAL HISTORY:  Denies use of tobacco products.  Rare intake of alcohol.  FAMILY HISTORY:  Noncontributory.  REVIEW OF SYSTEMS:  GENERAL:  No fever, chills, or night sweats.  NEURO: No seizures, syncope, or paralysis.  RESPIRATORY:  No shortness breath, productive cough, or hemoptysis.  CARDIOVASCULAR:  Does have a cardiac history with an MI.  Denies any chest pain or orthopnea.  GI:  She does have reflux.  No nausea, vomiting, diarrhea, or constipation.  GU:  No dysuria, hematuria, or discharge.  MUSCULOSKELETAL:  Knee  pain.  PHYSICAL EXAMINATION:  VITAL SIGNS:  Pulse 71, respirations 16, blood pressure 134/82. GENERAL:  The patient is a 65 year old female, well nourished, well developed, in no acute distress, alert, oriented, and cooperative. HEENT:  Normocephalic, atraumatic.  Pupils are round and reactive.  EOMs intact. NECK:  Supple. CHEST:  Clear. HEART:  Regular rhythm.  S1 and S2 noted. ABDOMEN:  Soft, nontender.  Bowel sounds present. RECTAL:  Not done, not pertinent to pertinent illness. BREASTS:  Not done, not pertinent to pertinent illness GENITALIA:  Not done, not pertinent to pertinent illness. EXTREMITIES:  Left knee shows marked crepitus noted on passive range of motion.  No effusion.  No instability.  IMPRESSION:  Osteoarthritis, left knee.  PLAN:  The patient admitted to [facility] to undergo a left total knee replacement arthroplasty.  Surgery will be performed by Dr. Ollen Gross.     Alexzandrew L. Julien Girt, P.A.C.   ______________________________ Ollen Gross, M.D.    ALP/MEDQ  D:  09/01/2010  T:  09/02/2010  Job:  [id number]  Electronically Signed by Patrica Duel P.A.C. on 09/22/2010 10:35:14 AM Electronically Signed by Ollen Gross M.D. on 10/05/2010 08:16:02 AM

## 2010-10-05 NOTE — Discharge Summary (Signed)
Rebecca [name], Rebecca [name]               ACCOUNT NO.:  [id number]  MEDICAL RECORD NO.:  [id number]          PATIENT TYPE:  INP  LOCATION:  1615                         FACILITY:  [facility]  PHYSICIAN:  Ollen Gross, M.D.    DATE OF BIRTH:  24-Feb-1946  DATE OF ADMISSION:  08/08/2010 DATE OF DISCHARGE:  08/11/2010                              DISCHARGE SUMMARY   ADMITTING DIAGNOSES: 1. Osteoarthritis, left knee. 2. Impaired hearing. 3. Early cataracts. 4. Hypertension. 5. History of myocardial infarction. 6. Coronary arterial disease. 7. Hiatal hernia. 8. Reflux disease. 9. Irritable bowel syndrome. 10.Hypothyroidism. 11.Osteoarthritis. 12.Postmenopausal.  DISCHARGE DIAGNOSES: 1. Osteoarthritis, left knee, status post left total knee replacement     arthroplasty. 2. Postop hyponatremia, improved. 3. Impaired hearing. 4. Early cataracts. 5. Hypertension. 6. History of myocardial infarction. 7. Coronary arterial disease. 8. Hiatal hernia. 9. Reflux disease. 10.Irritable bowel syndrome. 11.Hypothyroidism. 12.Osteoarthritis. 13.Postmenopausal.  PROCEDURE:  August 08, 2010, left total knee.  Surgeon, Ollen Gross, M.D.  Assistant, Rozell Searing, PAC.  Anesthesia, general.  Tourniquet time, 37 minutes.  CONSULTS:  None.  BRIEF HISTORY:  Rebecca [name] is a 65 year old female with end-stage arthritis of the left knee, progressive worsening pain and dysfunction, failed nonoperative management, now presents for total knee arthroplasty.  LABORATORY DATA:  CBC on admission, hemoglobin 13.4, hematocrit 40.7, white cell count 5.4, platelets 271.  Postop hemoglobin 10.3 and then 9.6, came back up to hemoglobin of 11.0 and hematocrit of 33.1.  PT/INR 11.8/0.85 with a PTT of 32.  Serial prothrombin times followed per Coumadin protocol.  Last done PT/INR 15.1 and 1.17.  Chem panel on admission, elevated ALT of 36, remaining Chem panel within normal limits.  Serial BMETs were followed.   Sodium did drop from 137 to 133, back up to 141.  Glucose went up 99 to 210, back down to 152.  The remaining electrolytes remained within normal limits.  Preop UA negative.  Blood group type A negative.  Nasal swabs were negative for MRSA.  HOSPITAL COURSE:  The patient was admitted to [facility], taken to OR, underwent the above-stated procedure without complication. The patient tolerated the procedure well and later transferred to recovery room and then to the orthopedic floor.  She had a rough night during the evening of surgery, did a little bit better on the morning of day #1.  She actually had more back pain than she did any type of knee pain and originally she was placed on med/surgery floor for monitoring. Hemovac drain was pulled without difficulty.  She had good urine output. Sodium did drop postop, felt to be a dilutional component, so we just watched that.  She had a PCA initially and that was discontinued on day #1 when she was tolerating her meds.  By day #2, she was doing much better, pain was under much better control.  Dressing changes, incision looked good.  Hemoglobin was down to 9.6.  We put her on some iron supplementation.  Pain was doing well and she wanted to switch over from Percocet to Vicodin, so we switched over the meds.  By day #3, she was  doing better.  She was tolerating her medication.  She had got a little dizzy with the Percocet.  She was tolerating the Vicodin now, tolerating her meds, and was discharged home at that time.  DISCHARGE PLAN: 1. Discharged home on August 11, 2010. 2. Discharge diagnoses, please see above. 3. Discharge meds, Robaxin, Vicodin, Lovenox, Coumadin.  Continue     Xanax, aspirin, diltiazem, furosemide, levothyroxine, Lipitor,     Nasonex, nitroglycerin, omeprazole, potassium chloride, Zyrtec.  DIET:  Heart-healthy diet.  ACTIVITY:  She is weightbearing as tolerated, total knee protocol, home PT and  nursing.  FOLLOWUP:  2 weeks.  DISPOSITION:  Home.  CONDITION ON DISCHARGE:  Improved.     Rebecca [name], P.A.C.   ______________________________ Ollen Gross, M.D.    ALP/MEDQ  D:  09/01/2010  T:  09/02/2010  Job:  [id number]  Electronically Signed by Patrica Duel P.A.C. on 09/22/2010 10:35:22 AM Electronically Signed by Ollen Gross M.D. on 10/05/2010 08:16:00 AM

## 2010-10-06 ENCOUNTER — Ambulatory Visit: Payer: Medicare Other | Admitting: Physical Therapy

## 2010-10-07 NOTE — H&P (Addendum)
NAMELESBIA, OTTAWAY               ACCOUNT NO.:  [id number]  MEDICAL RECORD NO.:  [id number]           PATIENT TYPE:  LOCATION:                                 FACILITY:  PHYSICIAN:  Ollen Gross, M.D.    DATE OF BIRTH:  1946/01/05  DATE OF ADMISSION: DATE OF DISCHARGE:                             HISTORY & PHYSICAL   CHIEF COMPLAINT:  Rebecca [name] came in to see Dr. Lequita Halt on June 14, 2010.  She came in for recheck of her right knee.  She was 15 weeks out from  total knee arthroplasty.  Her left knee, however, was still giving her quite a bit of pain.  She felt that the dysfunction is worsening. She is unable to do things she would like to do and she now presents for left total knee arthroplasty.  She has been cleared for surgery by her primary care physician, Dr. Kelle Darting.  MEDICATION ALLERGIES:  SULFONAMIDES, this causes rash.  PRIMARY CARE PHYSICIAN:  [name], MD.  CARDIOLOGIST:  Jesse Sans. Wall, MD, [facility]  CURRENT MEDICATIONS: 1. Diltiazem. 2. Aspirin. 3. Lipitor. 4. Furosemide. 5. Klor-Con. 6. Nitrostat. 7. Omeprazole. 8. Fish oil. 9. Calcium. 10.Multivitamin. 11.Cetirizine HCl. 12.Vitamin D. 13.Levothyroxine.  PAST MEDICAL HISTORY: 1. End-stage arthritis of the left knee. 2. Hypertension. 3. Coronary artery disease. 4. Myocardial infarction, 1993. 5. Hyperlipidemia. 6. Heart stenting. 7. Reflux disease. 8. Irritable bowel syndrome.  PAST SURGICAL HISTORY: 1. Heart stent 2. Cholecystectomy. 3. Abdominal laparoscopy. 4. Local mole removal. 5. Right total knee arthroplasty. 6. Removal of Morton's neuroma from the left foot.  FAMILY MEDICAL HISTORY:  Father passed at the age of 30, old age. Mother passed at the age of 38, she had heart disease.  SOCIAL HISTORY:  The patient is married.  She works as an Char[employer] fundraiser.  She denies use of tobacco products, states that she drinks alcohol occasionally.  She has 2 children.  She lives at home with her  husband. She does plan to go home following her hospital stay and she would like to have [place] from [place] as her physical therapist.  REVIEW OF SYSTEMS:  GENERAL:  Negative for fevers, chills or weight change.  HEENT/NEURO:  Negative for headache, blurred vision or insomnia.  DERMATOLOGIC:  Negative for rash or lesion.  RESPIRATORY: Negative for shortness of breath at rest or with exertion.  Last chest x- ray was 2010.  CARDIOVASCULAR:  Positive for occasional peripheral edema.  GI:  Positive for occasional diarrhea which she takes Lomotil for.  Denies nausea or vomiting.  GU:  Negative for hematuria, dysuria. Musculoskeletal:  Positive for joint pain, joint swelling.  PHYSICAL EXAMINATION:  VITAL SIGNS:  Pulse 80, respirations 18, blood pressure 128/70 in the left arm. GENERAL:  Rebecca [name] is alert and oriented x3.  She is well-developed, well-nourished, in no apparent distress. HEENT:  Positive for glasses.  Extraocular movements are intact. Normocephalic, atraumatic. NECK:  Supple.  Full range of motion without lymphadenopathy. CHEST:  Lungs are clear to auscultation bilaterally without wheezes. HEART:  She has occasional PVCs.  There is a systolic murmur noted. ABDOMEN:  Bowel sounds present in all 4 quadrants.  Abdomen is soft and nontender to palpation. EXTREMITIES:  Right knee range of motion from 0 to 120 degrees.  Left knee, significantly decrease range of motion, 5 to 110 degrees.  Pain with palpation over the medial joint line.  No instability is noted. SKIN:  Unremarkable. NEUROLOGIC:  Intact. PERIPHERAL VASCULAR:  Carotid pulses 2+ bilaterally without bruit.  RADIOGRAPHS:  AP and lateral views of the left knee reveals end-stage arthritis, tricompartmental.  IMPRESSION:  End-stage arthritis of left knee.  PLAN:  Left total knee arthroplasty to be performed by Dr. Lequita Halt.  Ms. Kerman has been cleared by her cardiologist, Dr. Valera Castle.     Rozell Searing,  PAC   ______________________________ Ollen Gross, M.D.    LD/MEDQ  D:  08/23/2010  T:  08/23/2010  Job:  [id number]  Electronically Signed by Rozell Searing  on 08/31/2010 08:52:10 AM Electronically Signed by Ollen Gross M.D. on 08/31/2010 02:54:33 PM

## 2010-10-10 ENCOUNTER — Ambulatory Visit
Admission: RE | Admit: 2010-10-10 | Discharge: 2010-10-10 | Disposition: A | Discharge status: Home / Self Care | Payer: Medicare Other | Source: Ambulatory Visit | Attending: Family Medicine | Admitting: Family Medicine

## 2010-10-10 DIAGNOSIS — Z1231 Encounter for screening mammogram for malignant neoplasm of breast: Secondary | ICD-10-CM

## 2010-10-11 ENCOUNTER — Telehealth: Payer: Self-pay | Admitting: *Deleted

## 2010-10-11 ENCOUNTER — Ambulatory Visit: Payer: Medicare Other | Admitting: Physical Therapy

## 2010-10-11 NOTE — Telephone Encounter (Signed)
Needs 1 proventil inhaler sent to [facility] until rx is received via mail from [employer]

## 2010-10-12 MED ORDER — ALBUTEROL SULFATE HFA 108 (90 BASE) MCG/ACT IN AERS: 2.0000 | INHALATION_SPRAY | Freq: Four times a day (QID) | RESPIRATORY_TRACT | Status: DC | PRN

## 2010-10-13 ENCOUNTER — Ambulatory Visit: Payer: Medicare Other | Admitting: Physical Therapy

## 2010-10-17 ENCOUNTER — Ambulatory Visit: Payer: Medicare Other | Attending: Orthopedic Surgery | Admitting: Physical Therapy

## 2010-10-17 DIAGNOSIS — R262 Difficulty in walking, not elsewhere classified: Secondary | ICD-10-CM | POA: Insufficient documentation

## 2010-10-17 DIAGNOSIS — IMO0001 Reserved for inherently not codable concepts without codable children: Secondary | ICD-10-CM | POA: Insufficient documentation

## 2010-10-17 DIAGNOSIS — M25569 Pain in unspecified knee: Secondary | ICD-10-CM | POA: Insufficient documentation

## 2010-10-17 DIAGNOSIS — M25669 Stiffness of unspecified knee, not elsewhere classified: Secondary | ICD-10-CM | POA: Insufficient documentation

## 2010-10-17 DIAGNOSIS — Z96659 Presence of unspecified artificial knee joint: Secondary | ICD-10-CM | POA: Insufficient documentation

## 2010-10-19 ENCOUNTER — Ambulatory Visit: Payer: Medicare Other | Admitting: Physical Therapy

## 2010-10-21 LAB — DIFFERENTIAL
Eosinophils Relative: 2 % (ref 0–5)
Lymphocytes Relative: 37 % (ref 12–46)
Lymphs Abs: 3.1 10*3/uL (ref 0.7–4.0)
Neutro Abs: 4.2 10*3/uL (ref 1.7–7.7)
Neutrophils Relative %: 52 % (ref 43–77)

## 2010-10-21 LAB — COMPREHENSIVE METABOLIC PANEL
AST: 37 U/L (ref 0–37)
BUN: 20 mg/dL (ref 6–23)
CO2: 27 mEq/L (ref 19–32)
Calcium: 10.4 mg/dL (ref 8.4–10.5)
Creatinine, Ser: 0.95 mg/dL (ref 0.4–1.2)
GFR calc Af Amer: >60 mL/min (ref >60)
GFR calc non Af Amer: 59 mL/min — ABNORMAL LOW (ref >60)

## 2010-10-21 LAB — POCT CARDIAC MARKERS
CKMB, poc: 2.5 ng/mL (ref 1.0–8.0)
Myoglobin, poc: 188 ng/mL (ref 12–200)
Troponin i, poc: <0.05 ng/mL (ref 0.00–0.09)

## 2010-10-21 LAB — CBC
Hemoglobin: 13.8 g/dL (ref 12.0–15.0)
MCHC: 34.5 g/dL (ref 30.0–36.0)
Platelets: 298 10*3/uL (ref 150–400)
RDW: 13.8 % (ref 11.5–15.5)

## 2010-10-21 LAB — TROPONIN I: Troponin I: 0.01 ng/mL (ref 0.00–0.06)

## 2010-10-21 LAB — LIPASE, BLOOD: Lipase: 22 U/L (ref 11–59)

## 2010-10-21 LAB — APTT: aPTT: 26 seconds (ref 24–37)

## 2010-11-29 NOTE — Assessment & Plan Note (Signed)
[facility]                            CARDIOLOGY OFFICE NOTE   NAME:Rebecca [name], Rebecca [name]                      MRN:          [id number]  DATE:02/15/2007                            DOB:          September 30, 1945    Rebecca [name] returns today for further management of her coronary artery  disease.  She is now status post bare metal stent  for unstable angina  to her right coronary artery in February, 2008.  She is off Plavix.  She  is still having some fatigue, but she is under a tremendous amount of  stress.  Her father of 19 is trying to die in [place].  She is  going back and forth every two weeks.  She is going back this evening.  She is not sleeping well.  She is asking me for a prescription for  Lunesta.   Her hemoglobin is back up to normal.  She is not having any guaiac  positive stools.  Her lipids are also remarkably good, recently checked  by Dr. Tawanna Cooler.   Her meds are unchanged since her last visit.  She is a little concerned  about her resting heart rate being 80-90.  Some of this may be stress.  She has been on Cardizem CD 300 mg in the past.  She did have some  peripheral edema with that.  I do not think she will tolerate a beta  blocker because of the fatigue.   PHYSICAL EXAMINATION:  Her blood pressure is 120/76.  Pulse is 90 and  regular.  Weight is 159 and stable.  HEENT:  Normocephalic and atraumatic.  PERRLA.  Extraocular movements  are intact.  Sclerae are clear.  Facial symmetry is normal.  LUNGS:  Clear.  HEART:  Regular rate and rhythm without gallop.  ABDOMEN:  Soft with good bowel sounds.  Organomegaly could not be  assessed.  EXTREMITIES:  Trace edema.  Pulses are intact.  NEUROLOGIC:  Intact.  SKIN:  Intact.   ASSESSMENT/PLAN:  Rebecca [name] is doing well.  She needs the following:  1. Begin Diltiazem Extended Release 180 daily.  2. Renew her nitroglycerin.  3. Stress Myoview when it is convenient for her and her schedule.     Right now, her father's health comes first, as noted above.  4. I have written for 30 days of Lunesta.  No refills.  I will plan on      seeing her back in six months.     Thomas C. Daleen Squibb, MD, [facility]  Electronically Signed   TCW/MedQ  DD: 02/15/2007  DT: 02/15/2007  Job #: [id number]   cc:   [name], MD

## 2010-11-29 NOTE — Assessment & Plan Note (Signed)
[facility]                            CARDIOLOGY OFFICE NOTE   NAME:Rebecca [name], Rebecca [name]                      MRN:          [id number]  DATE:09/12/2007                            DOB:          29-Aug-1945    Ms. Rebecca [name] returns today further management coronary disease.   Her father passed away in [place] since her last visit.  He was in hospice  and was 56-some years old.  This has taken a lot of stress off of her.   Unfortunately, she has had several episodes of substernal chest pain  out of the blue that responded to nitroglycerin.  She only taken about  4 to 5 nitro since her last procedure in February 2008.   She had a stress Myoview April 12, 2007, EF 67%, mild apical  thinning no ischemia or scar.   She has gained some weight.  Her blood pressure has been under good  control and her blood work is being followed by Dr. Tawanna Cooler.   Her medication unchanged since last visit.  We did add Cardizem CD 180  to her regimen last time.   EXAM:  Blood pressure 110/60, her pulse is 88 and regular, weight 164.  HEENT is unchanged from previous exams.  Carotids were equal bilateral bruits, no JVD.  Thyroid is not enlarged.  Trachea is midline.  LUNGS:  Clear.  HEART:  Reveals a poorly appreciated PMI.  Normal S1-S2 without gallop.  ABDOMEN:  Soft, good bowel sounds.  Organomegaly could not be assessed.  EXTREMITIES:  Reveal no significant edema.  Pulses are intact.  NEURO:  Exam is intact.   EKG is normal.   ASSESSMENT/PLAN:  Rebecca [name] is stable from our standpoint.  I am not  sure why she is having chest discomfort though I suspect it is not  cardiac or coronary.  She is having no exertional symptoms.   Advised her to stay on her current medications.  Will plan on seeing her  back again in 6 months.    Thomas C. Daleen Squibb, MD, [facility]  Electronically Signed   TCW/MedQ  DD: 09/12/2007  DT: 09/13/2007  Job #: [id number]   cc:   [name], MD

## 2010-11-29 NOTE — Assessment & Plan Note (Signed)
[facility]                            CARDIOLOGY OFFICE NOTE   NAME:[name], Rebecca KOOY                      MRN:          [id number]  DATE:08/03/2008                            DOB:          10-30-1945    [name] comes in today for followup.  She has been having no angina  or ischemic symptoms.  She carries nitro on necklace which she says has  always been her security blanket.   She is going to have to have knee surgery what sounds like arthroscopy  with Dr. Norlene Campbell in August 20, 2008.  She would like to stay on  her aspirin which I do not have a problem with and hopefully he warned  as well.   CURRENT MEDICATIONS:  1. Lipitor 80 mg a day.  2. Aspirin 325 mg a day.  3. Premarin.  4. Omega-3.  5. Citracal 600 mg p.o. b.i.d.  6. Nexium 40 mg q.a.m.  7. Vitamin D 2000 mg q.a.m.  8. Cardizem CD 180 mg daily.   She had a stress Myoview, April 12, 2007, which showed EF of 67%  with mild apical thinning.  No ischemia.  No significant scar.   PHYSICAL EXAMINATION:  VITAL SIGNS:  Her blood pressure today is 140/73.  She says this much better at home, her pulse is 86 and regular.  EKG  shows sinus rhythm with small Q waves inferiorly, which are old poor  progression across the antrum precordium.  This is stable.  Her weight  is 164.  HEENT:  Normal.  NECK:  Carotid upstrokes are equal bilateral without bruits.  No JVD.  Thyroid is not enlarged.  Trachea is midline.  LUNGS:  Clear to auscultation and percussion.  HEART:  Nondisplaced PMI.  Normal S1 and S2.  ABDOMEN:  Soft.  Good bowel sounds.  No midline bruit.  EXTREMITIES:  No cyanosis, clubbing, or edema.  Pulses are intact.  NEUROLOGIC:  Intact.   ASSESSMENT AND PLAN:  [name] is doing well.  I have also reviewed  her blood work from Dr. Nelida Meuse office which looks quite favorable.  I  reviewed her with her this as well.   Cleared her for surgery at low operative risk.  I  think it would be wise  for stay on her aspirin.  I will plan on seeing her back in 6 months.  At that time, she will need objective assessment of her coronary  disease.     Thomas C. Daleen Squibb, MD, [facility]  Electronically Signed    TCW/MedQ  DD: 08/03/2008  DT: 08/04/2008  Job #: [id number]

## 2010-12-02 NOTE — Assessment & Plan Note (Signed)
[facility]                         GASTROENTEROLOGY OFFICE NOTE   NAME:Rebecca [name], Rebecca [name]                      MRN:          [id number]  DATE:10/24/2006                            DOB:          1946/02/19    This very nice patient has been found to have some anemia with a  hemoglobin down to 10 and some positive stools. She is on Plavix, has  occasional heartburn, takes Nexium intermittently, had some constipation  at times. In February, as I noted, had some Hemoccult positive stools  and it was suggested that she see GI. She has had black stools in the  past at times, some weight change and appetite change but nothing  remarkable. She denies any dysphagia.   CURRENT MEDICATIONS:  Plavix, Lipitor, baby aspirin, citalopram,  Premarin, Omega 3, fish oil, Citrucel, Nexium, alprazolam, Lunesta,  nitroglycerin, and Nasonex.   Her last upper endoscopy was in 2004 and revealed a 2 cm hiatal hernia  and some minimal inflammatory changes of her stomach. A colonoscopic  examination was done the same time and was totally normal. She had also  had a previous one that was essentially normal. She had had a small  polyp in years previous. It was requested her stopping the Plavix but  she was not anxious to do this, she is a very bright lady, is a retired  [employer] fundraiser.   PAST MEDICAL HISTORY:  Revealed some hypertension, angina,  arteriosclerotic cardiovascular disease with prediabetes,  hyperlipidemia, anxiety, depression. She has had a melanoma. She has  mild sleep apnea. She is status post cholecystectomy and breast surgery  with biopsy in 2001. She has also had tubal ligation.   SOCIAL HISTORY:  She drinks only occasionally.   REVIEW OF SYSTEMS:  Arthritis, sleep problems, some anemia was noted,  severe fatigue, some decreased hearing.   PHYSICAL EXAMINATION:  VITAL SIGNS:  She is 5 feet, weighs 157. Blood  pressure is 108/70, pulse 72 and regular.  Neck, heart  and extremities are all basically unremarkable.   IMPRESSION:  1. Anemia with positive stools.  2. History of gastroesophageal reflux disease.  3. Arteriosclerotic cardiovascular disease.  4. Dyslipidemia.  5. History of irritable bowel syndrome.  6. Status post cholecystectomy.  7. Breast biopsies.  8. Status post melanoma.  9. Anxiety/depression.  10.History of early diabetes.  11.Hypertension.   RECOMMENDATIONS:  I really do not think that she needs any endoscopic  procedures a this point in time, instead I would like to repeat her CBC,  get a serum iron binding, ferritin, B12, folate level, put her on some  Nexium daily for the next several weeks, get ColoScreen's on her in 10  days. If there is any evidence of persistent bleeding then I think the  next procedure would be best be a capsule endoscopy. I really do not  think that we would gain much by reassessing her colon in that it has  been normal x2 or her upper gastrointestinal tract which was essentially  normal. Hopefully the above measures want be necessary. She possibly has  some arteriovenous malformations and the Plavix  just precipitated some  mild bleeding, but will check on the above studies and respond  accordingly.     Ulyess Mort, MD  Electronically Signed    SML/MedQ  DD: 10/24/2006  DT: 10/25/2006  Job #: [id number]   cc:   [name], MD  Jesse Sans. Wall, MD, [facility]

## 2010-12-02 NOTE — Cardiovascular Report (Signed)
NAMEJANEANN, [name]               ACCOUNT NO.:  [id number]   MEDICAL RECORD NO.:  [id number]          PATIENT TYPE:  INP   LOCATION:  2807                         FACILITY:  [facility]   PHYSICIAN:  Salvadore Farber, MD  DATE OF BIRTH:  04-20-1946   DATE OF PROCEDURE:  09/05/2006  DATE OF DISCHARGE:                            CARDIAC CATHETERIZATION   PROCEDURE:  Left heart catheterization, left ventriculography, coronary  angiography, intravascular ultrasound of the right coronary artery and  left anterior descending artery, bare metal stent placement in the  proximal right coronary artery, AngioSeal closure of the right common  femoral arteriotomy site.   INDICATIONS:  Rebecca [name] is a 65 year old lady who suffered an inferior  myocardial infarction in 1992.  Due to a late presentation, that vessel  was left occluded.  She had a stress test just two months ago  demonstrating no prior infarction and no evidence of ischemia.  She  presents today having developed substernal chest discomfort reminiscent  of the pain which accompanied her prior myocardial infarction.  It was  temporarily improved with sublingual nitroglycerin but recurred multiple  times.  She then presented to the [facility].  There,  point of care markers are normal and electrocardiogram shows only  nonspecific ST-T abnormalities.  She was started on IV nitroglycerin and  has remained pain free.  She is referred for urgent cardiac  catheterization and coronary angiography.   PROCEDURE TECHNIQUE:  Informed consent was obtained.  Under 1% lidocaine  local anesthesia, a 5-French sheath was placed in the right common  femoral artery using the modified Seldinger technique.  Diagnostic  angiography and ventriculography were performed using JL-4, JR-4, and  pigtail catheters.  This demonstrated ulcerated 85% stenosis in the  right coronary artery and a hazy 50% stenosis in the mid LAD with  associated  calcification.  Based on these images, it was clear that the  culprit lesion was in the right coronary.  We also planned to perform  intravascular ultrasound of the LAD to exclude it as a second culprit  lesion.   Anticoagulation was initiated with heparin, double bolus eptifibatide,  and 600 mg of Plavix.  The ACT was maintained at greater than 200  seconds.  The sheath was upsized over a wire to 6-French.  A 6-French 3D  RCA guiding catheter was advanced over the wire and engaged in the  ostium of the right coronary.  A Prowater wire was advanced distally  without difficulty.  The lesion was predilated using a 3 x 20 mm  Maverick at 6 atmospheres.  The lesion was then stented using a 3.5 x 24  mm Liberte stent deployed at 16 atmospheres.  We then performed  intravascular ultrasound demonstrating no residual stenosis, no  dissection, and excellent stent apposition and expansion.  We then  removed the wire and guide.   Attention was then turned to the LAD.  A 6-French CLS 3.5 guide was  advanced over the wire engaging the ostium of the left main.  A Prowater  wire was advanced to the distal LAD without difficulty.  We then  performed intravascular ultrasound of the LAD.  This demonstrated distal  reference vessel measuring 2.3 x 2.9 mm with luminal area of 5.13 mm  squared.  The most severely stenosed portion of the lesion measured 1.6  x 2.1 mm with an area of 2.58 mm squared.  That gives an area of  stenosis of 50%.  The lesion has circumferential calcification and  involves a diagonal.  There was no evidence of thrombus.  It does appear  to be a stable plaque.  Since she has not had antecedent angina, we  elected to manage it conservatively.   The arteriotomy was then closed using AngioSeal device.  Complete  hemostasis was obtained.  She was then transferred to the holding room  in stable condition having tolerated the procedure well.   COMPLICATIONS:  None.   FINDINGS:  1.  LV:  120/0/4.  EF 65% with hypokinesis of a focal portion of the      mid inferior wall.  2. No aortic stenosis or mitral regurgitation.  3. Left main:  Angiographically normal.  4. LAD:  Moderate sized vessel giving rise to a single diagonal.      There is a calcified lesion just after the diagonal.  This appears      to be approximately 50% diameter stenosis angiographically and had      a 50% area of stenosis by intravascular ultrasound.  No evidence of      associated thrombus.  5. Ramus intermedius:  Small vessel which is angiographically normal.  6. Circumflex:  Large vessel giving rise to a single large marginal.      It is angiographically normal.  7. RCA:  Moderate sized dominant vessel.  There was an 85% ulcerated      plaque proximally stented to no residual using a bare metal stent.   IMPRESSION/PLAN:  Successful percutaneous revascularization of the  culprit lesion of the right coronary artery.  Will plan medical  management of the moderate stenosis within her LAD.  She should be  maintained on Plavix for nine months given her unstable presentation.  Will continue her aspirin indefinitely.  We will discontinue her  diltiazem in favor of beta blocker given her recurrent myocardial  infarction.      Salvadore Farber, MD  Electronically Signed     WED/MEDQ  D:  09/05/2006  T:  09/05/2006  Job:  [id number]   cc:   [name], MD  Jesse Sans. Wall, MD, [facility]

## 2010-12-02 NOTE — H&P (Signed)
NAMECHRISTYANN, Rebecca [name]               ACCOUNT NO.:  [id number]   MEDICAL RECORD NO.:  [id number]          PATIENT TYPE:  EMS   LOCATION:  MAJO                         FACILITY:  [facility]   PHYSICIAN:  Rollene Rotunda, MD, FACCDATE OF BIRTH:  09-03-1945   DATE OF ADMISSION:  09/05/2006  DATE OF DISCHARGE:                              HISTORY & PHYSICAL   PRIMARY CARE PHYSICIAN:  [name], M.D.   CARDIOLOGIST:  Jesse Sans. Wall, MD, FACC   REASON FOR PRESENTATION:  Evaluate patient with chest pain and coronary  disease.   HISTORY OF PRESENT ILLNESS:  The patient is a lovely 65 year old white  female with a past cardiac history of an inferior myocardial infarction  out of the hospital in 1992.  She reports an occluded RCA that was  managed medically and no other obstructive disease.  She has never  required another catheterization.  She has had several stress tests over  the years.  The most recent was an stress Cardiolite in November of  2007.  This demonstrated a EF of 69% with no evidence of ischemia or  infarct.   The patient had been in her usual state of health until yesterday  evening.  She says she just did not feel well after going to bed.  She  really could not quantify or qualify this.  However, at 5:30 this  morning, she was awakened with chest discomfort.  This was a sharp  discomfort under her sternum.  She thinks it may have been similar to  her previous cardiac pain.  She does not think it was similar to reflux.  The pain would wax and wane from 3-10 of intensity.  She took some Tums  without relief.  She took nitroglycerin and did have improvement, though  not complete resolution of her discomfort.  The discomfort would come  back and she took two more nitroglycerins.  She was very diaphoretic  when the pain was at its peak.  She felt presyncopal.  She came to the  emergency room where initial point of care markers were negative.  Her  initial EKG shows no acute  findings.  There were some chronic inferior T-  wave inversions.  She had been started on IV nitroglycerin and has had  some improvement in her symptoms, although it is very slight currently.  She has had mild nausea but no vomiting.  She has not been particularly  short of breath with this.  There has been no radiation.  Her shoulders  have been little a comfortable.  She said that prior to this she was  active and not bringing on any symptoms.   PAST MEDICAL HISTORY:  Coronary artery disease as described, no  dyslipidemia, hypertension.   PAST SURGICAL HISTORY:  Cholecystectomy.   ALLERGIES:  SULFA.   MEDICATIONS:  1. Aspirin.  2. Citrucel.  3. Diltiazem 300 mg daily.  4. Lasix 20 mg daily.  5. Lipitor 80 mg daily.  6. Lunesta 3 mg q.h.s. p.r.n.  7. Nasarel.  8. Nasonex.  9. Potassium.  10.Citalopram 10 mg daily.   SOCIAL  HISTORY:  She has been married for 40 years.  She has one living  child.  She had a daughter who died at age 97 of metastatic melanoma.  She does not smoke cigarettes.   FAMILY HISTORY:  Strongly positive for early heart disease.  Her mother  started having heart attacks at age 82.  She was disabled from this,  though she lived into her 1s.   REVIEW OF SYSTEMS:  As stated in the HPI.  Negative for all other  systems.   PHYSICAL EXAMINATION:  GENERAL:  The patient is not in acute distress.  VITAL SIGNS:  Blood pressure 126/73, heart rate 64 and regular,  afebrile.  HEENT:  Eyelids are unremarkable.  Pupils equal, round and reactive to  light.  Fundi not visualized.  Oral mucosa unremarkable.  NECK:  No jugular distension at 45 degrees.  Carotid upstroke brisk and  symmetric.  No bruits, thyromegaly.  LYMPHATICS:  No cervical, axillary or inguinal adenopathy.  LUNGS:  Clear to auscultation bilaterally.  BACK:  No costovertebral angle tenderness.  CHEST:  Unremarkable.  HEART:  PMI not displaced or sustained, S1-S2 within normal limits.  No  S3, no  S4, no clicks, no rubs, no murmurs.  ABDOMEN:  Flat, positive bowel sounds.  Normal in frequency and pitch.  No bruits, rebound, guarding.  No midline pulsatile mass.  No  hepatomegaly or splenomegaly.  SKIN:  No rashes, no nodules.  EXTREMITIES:  There were 2+ pulses throughout.  No edema, no cyanosis,  no clubbing.  NEUROLOGIC:  Oriented to person, place and time.  Cranial nerves II-XII  grossly intact, motor grossly intact throughout.   ELECTROCARDIOGRAM:  Sinus bradycardia, rate 57, axis within normal  limits, intervals within normal limits, poor anterior R-wave progression  suggestive of an old anteroseptal myocardial infarction, nonspecific  inferior T-wave changes.  No change from previous EKGs.   LABORATORY DATA:  WBC 6.5, hemoglobin 13.5, platelets 284, INR 1.0,  point care markers negative x1, sodium 138, potassium 4.1, BUN 18,  creatinine 0.85, glucose 131, AST 27, ALT 37, magnesium 2.0.   CHEST X-RAY:  Bilateral slight basilar atelectasis but no acute  infiltrates.   ASSESSMENT/PLAN:  1. Chest discomfort.  The patient's chest discomfort has some very      suspicious features for obstructive coronary disease or acute      coronary syndrome.  I think pretest probability is moderately high.      Therefore, I think cardiac catheterization is indicated.  I have      extensively discussed the risks and benefits including stroke,      death, heart attack, myocardial infarction, infection, bleeding,      bruising, vascular trauma and other.  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  She has also been through the procedure      before.  She and her husband understand and agree to proceed.  We      will also cycle enzymes.  We will continue her other medications as      listed.  Currently she is on IV nitroglycerin.  2. Hypertension.  Again, she will continue medications as listed.      Will discuss with Dr. Daleen Squibb beta blocker versus calcium channel     blocker.   3. Risk reduction.  We will make sure she has had a fasting lipid      profile recently or one will be drawn.  4. Hyperglycemia.  She has an elevated blood sugar.  I am not sure      where this was drawn, whether she might have been getting some      glucose in her IV.  This can be looked at with fasting blood work      in the morning and followed up per Dr. Tawanna Cooler.      Rollene Rotunda, MD, [facility]  Electronically Signed     JH/MEDQ  D:  09/05/2006  T:  09/05/2006  Job:  [id number]   cc:   [name], MD  Jesse Sans. Wall, MD, [facility]

## 2010-12-02 NOTE — Assessment & Plan Note (Signed)
[facility]                            CARDIOLOGY OFFICE NOTE   NAME:Rebecca [name], Rebecca [name]                      MRN:          [id number]  DATE:09/13/2006                            DOB:          29-Jun-1946    PRIMARY CARE PHYSICIAN:  Rebecca Hoes. Tawanna Cooler, MD.   PRIMARY CARDIOLOGIST:  Rebecca Sans. Wall, MD, [facility]   HISTORY OF PRESENT ILLNESS:  Rebecca [name] is a very pleasant 65 year old  female patient followed by Dr. Daleen [name] with a history of coronary disease  with out-of-hospital myocardial infarction in 1992, who presented to  [facility] on September 05, 2006 with chest pain.  She ruled in  for acute coronary syndrome and was taken to the cardiac catheterization  lab by Dr. Samule [name].  Her catheterization revealed an EF of 65%, with 50%  LAD stenosis that was noted to be 50% by IVUS.  The ramus intermedius  and circumflex were both normal.  She had an 85% lesion in the RCA that  was ulcerated.  She underwent intervention with a bare metal stent to  her RCA.  She was noted to be mildly anemic, was sent home with heme-  occult cards.  She apparently followed up with Dr. Tawanna [name] earlier this  week and was noted to have heme-occult cards positive x3.  Dr. Tawanna [name]  apparently spoke to Dr. Samule [name], who asked that she be seen in the  emergency room.  The patient stayed in the emergency room for about 6  hours and was seen by Rebecca Moccasin, PA-C with gastroenterology.  She  was sent home from the ED with plans for followup today with me in the  office, as well as with Dr. Victorino [name], her primary gastroenterologist  in late March.  She notes that she feels fatigued.  She is also  complaining of some lightheadedness.  She feels that she is pale.  Her  right groin is somewhat tender but is improving.  She denies any  recurrent chest pain, shortness of breath or syncope.  She denies any  orthopnea or paroxysmal nocturnal dyspnea.   CURRENT MEDICATIONS:  1. Plavix 75 mg  daily.  2. Furosemide 20 mg, 1-2 tablets daily.  3. Lipitor 80 mg daily.  4. Potassium 20 mEq 1-2 daily.  5. Amlodipine 5 mg daily.  6. Aspirin 3-5 mg daily.  7. Citalopram.  8. Premarin vaginal cream.  9. Omega-3 fish oil capsules twice daily.  10.Citracal 600 mg twice daily.  11.Lunesta p.r.n.  12.Alprazolam p.r.n.  13.Nitroglycerin p.r.n.  14.Nasonex p.r.n.   ALLERGIES:  SULFA.   PHYSICAL EXAMINATION:  GENERAL:  She is a well-nourished, well-developed  female in no distress.  VITAL SIGNS:  Blood pressures 100/54, pulse 73, weight 158 pounds.  HEENT:  Unremarkable.  NECK:  Without JVD.  CARDIAC:  S1, S2, regular rate and rhythm.  LUNGS:  Clear to auscultation bilaterally.  ABDOMEN:  Soft, nontender.  EXTREMITIES:  Without edema.  Calves are soft, nontender.  SKIN: Warm and dry.  RIGHT GROIN:  Without hematoma or bruits.  There is positive ecchymosis  noted.  Electrocardiogram revealed sinus rhythm.  The heart rate is 72, normal  axis, nonspecific ST-T wave changes.  No old tracing to compare with.   IMPRESSION:  1. Coronary artery disease.      a.     Status post acute coronary syndrome, September 05, 2006,       treated with a bare metal stent to the RCA.      b.     A history of inferior out of the hospital myocardial       infarction 1992 treated medically.      c.     LAD stenosis of 50% by recent catheterization.  2. Preserved LV function.  3. Treated dyslipidemia.  4. Irritable bowel syndrome.  5. Heme-occult positive anemia.      a.     Recent hemoglobin 11.4, hematocrit 33.1 August 11, 2006       with an MCV of 90.6.  6. History of cholecystectomy.  7. ALLERGY TO SULFA.  8. Family history of coronary disease.   PLAN:  The patient presents to the office today for followup.  This was  arranged when she was seen in the hospital emergency room for complaints  of anemia and heme-positive stools.  There was some concern at that time  that she was having  significant bleeding, and there was some talk about  her coming off of aspirin and Plavix for GI evaluation.  Dr. Samule [name] also  saw the patient today.  He was in touch with Rebecca [name] with the  gastroenterology service.  It was not felt that she was having  significant GI bleeding at the time of her evaluation in the ED, and  therefore was sent home.  She does need followup on her hemoglobin.  We  will recheck a CBC today.  We will check some orthostatics today as well  before she leaves the office, as she is complaining of a lot of  lightheadedness.  Her heart rate is stable, and her groin looks stable  without hematoma or bruit.  There is some ecchymosis, and I reassured  her about this today.  She will continue all of her medications as listed above and follow up  with Dr. Daleen [name] next week as scheduled.  She will also follow up with Dr.  Tawanna [name] in the next week.      Rebecca Newcomer, PA-C  Electronically Signed      Rebecca Farber, MD  Electronically Signed   SW/MedQ  DD: 09/13/2006  DT: 09/13/2006  Job #: [id number]   cc:   Rebecca Gens A. Tawanna Cooler, MD

## 2010-12-02 NOTE — Assessment & Plan Note (Signed)
[facility]                              CARDIOLOGY OFFICE NOTE   NAME:[name], Rebecca CAVALLARO                      MRN:          [id number]  DATE:06/12/2006                            DOB:          05/05/1946    Ms. Livecchi returns today for further management of the following issues.   HISTORY:  1. Coronary artery disease, status post acute inferior wall infarct. Total      occlusion right coronary artery in 1992. She is having no angina or      ischemic symptoms. She is due a stress Myoview followup, last one being      May 26, 2004.  2. Hyperlipidemia. Recent lipids on Lipitor 80 by Dr. Tawanna Cooler showed a total      cholesterol of 131, triglycerides 87 and HDL of 45.2, LDL of 68. Liver      function tests were normal.  3. Hypertension, under good control with Dr. Tawanna Cooler.  4. Obesity.   Her biggest complaint is lower extremity edema. She is on as much as 40 mg  of Lasix sometimes daily. Last year she was only taking 20 mg every other  day. Some of this may be related to her weight, also obstructive sleep  apnea, but these are stable. She is also on Cardizem.   Her medications today are multivitamin daily, Citrucel with D and zinc,  aspirin 81 mg a day, Cardizem 300 mg a day, Nasonex p.r.n., Premarin vaginal  cream, potassium 20 mEq a day, Lasix 20 to 40 mg a day, Lunesta 2 mg at  bedtime, omega 3 fish oil b.i.d., Lipitor 80 mg at bedtime.   Her blood pressure is 128/78. Her pulse is 65 and regular. Weight is 159, up  7.  HEENT: Normocephalic, atraumatic. PERRLA. Extra-ocular movements intact.  Sclerae are clear. Facial symmetry is normal. Dentition is satisfactory.  NECK: Shows no JVD.  Carotid upstrokes are full bilaterally without bruits.  There is no thyromegaly. Trachea is midline.  LUNGS:  Are clear.  HEART: Reveals a soft S1, S2 without murmur, rub or gallop. S2 splits  physiologically.  ABDOMEN: Soft with good bowel sounds. There is no  tenderness. There is no  organomegaly.  EXTREMITIES: Reveals no cyanosis, clubbing, but do show 1+ pitting edema at  the ankles. Pulses are present.  NEURO: Intact.  MUSCULOSKELETAL: Is normal.  SKIN: Is clear.   Electrocardiogram is essentially normal.   ASSESSMENT/PLAN:  Miss Westall continues to do well. Biggest complaint again  is that of edema.   PLAN:  1. Change Cardizem to amlodipine 5 mg per mouth daily. Hope this will be      associated with a little less swelling. She will follow her blood      pressure and let us know if there is a problem.  2. Continue on the medications.  3. Rest, exercise, Myoview.   If this is negative for ischemia, will plan on seeing her back in a year.     Thomas C. Daleen Squibb, MD, [facility]  Electronically Signed    TCW/MedQ  DD: 06/12/2006  DT: 06/12/2006  Job #: [id number]   cc:   Eugenio Hoes. Tawanna Cooler, MD

## 2010-12-02 NOTE — Assessment & Plan Note (Signed)
[facility]                            CARDIOLOGY OFFICE NOTE   NAME:Rebecca [name], Rebecca [name]                      MRN:          [id number]  DATE:09/21/2006                            DOB:          September 02, 1945    Rebecca [name] returns today for close follow-up.  She was seen on September 13, 2006.  Please see the comprehensive note by Tereso Newcomer, PA-C.  She  continues to have dizziness, particularly with change in position.  She  has also had some ringing in her right ear.  She hears a swooshing sound  when she lays on her right ear.  She denies a fever, headache or loss of  hearing.   She has had no angina or ischemic symptoms.   She has been off her amlodipine because of a low blood pressure and off  of her Lasix and potassium.  She actually feels better.   She is following up with Dr. Corinda Gubler for microscopic blood in her stool.  Her hemoglobin actually had increased on her last visit to 11.4.   Her blood pressure is 125/79.  Her pulse is 79 and regular.  She is in  no acute distress.  She got dizzy just getting up on the table and  turning her head.  PERRLA.  Extraocular movements intact.  Sclerae are  clear.  There is no nystagmus.  Facial symmetry is normal.  Carotid  upstrokes are equal bilaterally without bruits.  There is no JVD.  Thyroid is not enlarged.  Trachea is midline.  Lungs are clear to  auscultation and percussion.  Heart reveals a regular rate and rhythm  without murmur, rub or gallop.  Abdominal exam is soft with good bowel  sounds.  Extremities reveal no edema.  Pulses are intact.  Neuro exam is  intact.   ASSESSMENT:  1. Benign positional vertigo.  2. Occult positive stools.  Her hemoglobin is starting to increase.      She reports no gross blood.  3. Blood pressure under reasonable control off of amlodipine as well      as Lasix and potassium.  4. Edema, has not recurred off of Lasix.   RECOMMENDATIONS:  1. Meclizine 25 mg p.o.  b.i.d.  2. Stay off of amlodipine and only use Lasix and potassium p.r.n. for      edema.  3. Carry sublingual nitroglycerin which she has around her neck.  4. If she continues to have guaiac positive stools and/or hemoglobin      is dropping, I would stop the Plavix after 6 weeks post stent.      This would be around the 1st of April.   I will plan on seeing her back in 2 months.     Thomas C. Daleen Squibb, MD, [facility]  Electronically Signed    TCW/MedQ  DD: 09/21/2006  DT: 09/22/2006  Job #: [id number]   cc:   Ulyess Mort, MD  Eugenio Hoes Tawanna Cooler, MD

## 2010-12-02 NOTE — Discharge Summary (Signed)
[name], [name]               ACCOUNT NO.:  [id number]   MEDICAL RECORD NO.:  [id number]          PATIENT TYPE:  INP   LOCATION:  6531                         FACILITY:  [facility]   PHYSICIAN:  Rebecca Sans. Wall, Rebecca [name], Rebecca OF BIRTH:  [name]   DATE OF ADMISSION:  09/05/2006  DATE OF DISCHARGE:  09/06/2006                               DISCHARGE SUMMARY   PROCEDURES:  1. Cardiac catheterization.  2. Coronary arteriogram.  3. Left ventriculogram.  4. PTCA and bare metal stent to one vessel.   PRIMARY DIAGNOSIS:  Unstable anginal pain.   SECONDARY DIAGNOSES:  1. Hyperlipidemia.  2. History of lower extremity edema.  3. Irritable bowel syndrome.  4. History of inferior out of hospital myocardial infarction in 1992      with no percutaneous intervention.  5. Status post cholecystectomy.  6. Allergy or intolerance to sulfa.  7. Family history of coronary artery disease.  8. Possible history of depression.   TIME AT DISCHARGE:  37 minutes.   HOSPITAL COURSE:  Rebecca [name] is a 65 year old female with previous  coronary artery disease managed medically.  She woke with chest pain on  the day of admission that improved with sublingual nitroglycerin and was  associated with diaphoresis as well as presyncope.  She came to the  hospital and was admitted for further evaluation and treatment.   Her initial cardiac enzymes were negative, but it was felt that she  needed cardiac catheterization to further define her anatomy.  This was  performed on September 05, 2006.   Her RCA had an ulcerated 85% lesion treated with a bare metal stent  reducing the stenosis to 0.  She had a calcified area in her LAD that  was 50% by cath and 49% area of stenosis by intravascular ultrasound.  Her EF was 65% with focal mid inferior hypokinesis but no AS or MR.   Postprocedure she had no chest pain.  Her troponin elevated minimally at  0.19, but her CK-MB remained normal.  She was slightly anemic  postprocedure with a hemoglobin of 10.6 and a hematocrit of 30.6.  She  will do stool guaiac cards as an outpatient and followup with her  primary care physician.  Of note, she had elevated blood sugars at 127  and 131 which she can follow up with her primary care physician as well.   On September 06, 2006, she was evaluated by Dr. Daleen Squibb.  She was having no  chest pain or shortness of breath, and her cath site was without bruit  or hematoma.  She was considered stable for discharge with outpatient  followup arranged.   DISCHARGE INSTRUCTIONS:  Activity level is to be increased gradually.  She is not to drive for 2 days, and she is not to lift for a week.  No  sexual activity for 2 weeks.  She is to stick to a low sodium heart  healthy diet.  She is to keep her cath site clean and dry for a week.  She is to followup with Dr. Daleen Squibb on March 7 at 4 p.m. and with  Dr. Tawanna Cooler  as needed.   DISCHARGE MEDICATIONS:  1. Aspirin 325 mg a day.  2. Plavix 75 mg daily.  3. Lipitor 80 mg daily p.m.  4. Lasix 20 mg daily.  5. Celexa 10 mg daily.  6. Nitroglycerin sublingual p.r.n.  7. Diltiazem 300 mg daily.  8. Potassium 20 mEq daily.  9. Nasonex as prior to admission.  10.Vitamins, fish oil, Citracal, Lunesta, and Premarin cream as prior      to admission.      Rebecca Demark, Rebecca [name]      Rebecca Sans. Daleen Squibb, Rebecca [name], [name]  Electronically Signed    RB/MEDQ  D:  09/06/2006  T:  09/06/2006  Job:  [id number]   cc:   [name], Rebecca [name]

## 2010-12-02 NOTE — Assessment & Plan Note (Signed)
 [facility]                            CARDIOLOGY OFFICE NOTE   NAME:Rebecca [name], Rebecca [name]                      MRN:          [id number]  DATE:09/13/2006                            DOB:          [birth year]    ADDENDUM TO OFFICE NOTE:  Ms. Inglis underwent orthostatic vital signs  in the office.  Her blood pressure lying was 120/70, pulse was 64;  sitting 100/70 with a pulse of 64; standing 90/64 with a pulse of 64.  Two minutes 90/palp with a pulse of 64.  She was symptomatic with this.   As noted in the original note, she will have a CBC drawn STAT today.  She does not appear to be severely anemic, and her hemoglobin just two  days ago was fairly stable.  Also, she is not tachycardic.  I have asked  her to hold her Lasix as well as her amlodipine.  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  She is going to keep an eye on  her blood pressures at home.  If her pressure goes above 140/90, she is  to go back on her amlodipine.  She knows to go to the emergency room  should she begin to feel any worse.  We will follow up on her CBC later  this afternoon.  If it has dropped significantly, she will likely  require further evaluation today.   Addendum 09/13/06:  Hgb 11.4, Hct 33.3 on blood work today.  These  numbers are stable and unchanged from 09/11/06.  Continue with same  therapy as outlined in original note. SW      Tereso Newcomer, PA-C  Electronically Signed      Salvadore Farber, MD  Electronically Signed   SW/MedQ  DD: 09/13/2006  DT: 09/13/2006  Job #: [id number]

## 2010-12-02 NOTE — Assessment & Plan Note (Signed)
[facility]                            CARDIOLOGY OFFICE NOTE   NAME:[name], Rebecca KIVI                      MRN:          [id number]  DATE:11/16/2006                            DOB:          1945/10/16    Rebecca [name] returns today for followup.  She is just having a lot of  fatigue and sort of hits the wall in the afternoon.  She is getting a  lot more sleep and her hemoglobin is now near normalized.   Her only new drug is Plavix, which she has been on since February when  she had her bare metal stent.  She has also been on Lipitor 80 since  last summer.  Her lipids were at goal on 80 when they were last checked.   She is having no angina or ischemic symptoms.   Her meds otherwise are unchanged.   Her blood pressure today is 112/76, her pulse is 89 and regular, her  weight is 158, O2 sat is 94%.  HEENT:  Normocephalic, atraumatic, PERRLA, extraocular movements intact.  Sclera are clear.  Facial symmetry is normal, dentition satisfactory,  skin color is normal.  She looks great.  Carotid upstrokes were equal  bilaterally without bruits, no JVD.  Thyroid is not enlarged, trachea is  midline.  LUNGS:  Clear.  HEART:  A normal S1, S2 without murmur, rub or gallop.  ABDOMINAL EXAM:  Soft, good bowel sounds.  EXTREMITIES:  Without edema, pulses are intact.  NEURO EXAM:  Intact.   ASSESSMENT AND PLAN:  I have asked Rebecca [name] to cut her Lipitor down to  40 mg a day.  After 4 weeks, she is to assess if she is better or not.  If she is, she will need followup blood work in 6 weeks from today.  If  not, she will go back on 80 of Lipitor.   She can stop her Plavix at the end of this month.  I will plan on seeing  her back in August of 2008.     Thomas C. Daleen Squibb, MD, [facility]  Electronically Signed    TCW/MedQ  DD: 11/16/2006  DT: 11/16/2006  Job #: [id number]   cc:   [name], MD

## 2010-12-05 ENCOUNTER — Ambulatory Visit: Payer: Medicare Other | Attending: Orthopedic Surgery | Admitting: Physical Therapy

## 2010-12-05 DIAGNOSIS — R262 Difficulty in walking, not elsewhere classified: Secondary | ICD-10-CM | POA: Insufficient documentation

## 2010-12-05 DIAGNOSIS — IMO0001 Reserved for inherently not codable concepts without codable children: Secondary | ICD-10-CM | POA: Insufficient documentation

## 2010-12-05 DIAGNOSIS — M25669 Stiffness of unspecified knee, not elsewhere classified: Secondary | ICD-10-CM | POA: Insufficient documentation

## 2010-12-05 DIAGNOSIS — Z96659 Presence of unspecified artificial knee joint: Secondary | ICD-10-CM | POA: Insufficient documentation

## 2010-12-05 DIAGNOSIS — M25569 Pain in unspecified knee: Secondary | ICD-10-CM | POA: Insufficient documentation

## 2010-12-06 ENCOUNTER — Ambulatory Visit: Payer: Medicare Other | Admitting: Physical Therapy

## 2010-12-08 ENCOUNTER — Ambulatory Visit: Payer: Medicare Other | Admitting: Rehabilitation

## 2010-12-13 ENCOUNTER — Encounter: Payer: Medicare Other | Admitting: Physical Therapy

## 2010-12-15 ENCOUNTER — Ambulatory Visit: Payer: Medicare Other | Admitting: Physical Therapy

## 2010-12-22 ENCOUNTER — Ambulatory Visit: Payer: Medicare Other | Attending: Orthopedic Surgery | Admitting: Physical Therapy

## 2010-12-22 DIAGNOSIS — Z96659 Presence of unspecified artificial knee joint: Secondary | ICD-10-CM | POA: Insufficient documentation

## 2010-12-22 DIAGNOSIS — IMO0001 Reserved for inherently not codable concepts without codable children: Secondary | ICD-10-CM | POA: Insufficient documentation

## 2010-12-22 DIAGNOSIS — M25669 Stiffness of unspecified knee, not elsewhere classified: Secondary | ICD-10-CM | POA: Insufficient documentation

## 2010-12-22 DIAGNOSIS — M25569 Pain in unspecified knee: Secondary | ICD-10-CM | POA: Insufficient documentation

## 2010-12-22 DIAGNOSIS — R262 Difficulty in walking, not elsewhere classified: Secondary | ICD-10-CM | POA: Insufficient documentation

## 2010-12-23 ENCOUNTER — Ambulatory Visit: Payer: Medicare Other | Admitting: Physical Therapy

## 2011-02-23 ENCOUNTER — Encounter: Payer: Self-pay | Admitting: Family Medicine

## 2011-02-23 ENCOUNTER — Ambulatory Visit (INDEPENDENT_AMBULATORY_CARE_PROVIDER_SITE_OTHER): Payer: Medicare Other | Admitting: Family Medicine

## 2011-02-23 VITALS — BP 130/90 | Temp 98.2°F | Wt 160.0 lb

## 2011-02-23 DIAGNOSIS — J45901 Unspecified asthma with (acute) exacerbation: Secondary | ICD-10-CM

## 2011-02-23 MED ORDER — ALBUTEROL SULFATE HFA 108 (90 BASE) MCG/ACT IN AERS: 2.0000 | INHALATION_SPRAY | Freq: Four times a day (QID) | RESPIRATORY_TRACT | Status: DC | PRN

## 2011-02-23 MED ORDER — PREDNISONE 20 MG PO TABS: ORAL_TABLET | ORAL | Status: DC

## 2011-02-23 MED ORDER — ALBUTEROL SULFATE (2.5 MG/3ML) 0.083% IN NEBU
2.5000 mg | INHALATION_SOLUTION | Freq: Four times a day (QID) | RESPIRATORY_TRACT | Status: DC
Start: 2011-02-23 — End: 2012-05-16

## 2011-02-23 NOTE — Patient Instructions (Signed)
Drink 32 ounces of water daily.  Prednisone as directed.  Hand-held nebulizer with albuterol 4 times daily.  Follow-up on Tuesday.  Rest at home

## 2011-02-23 NOTE — Progress Notes (Signed)
  Subjective:    Patient ID: Rebecca [name], female    DOB: [birth year], 65 y.o.   MRN: [id number]  Rebecca [name] is a 65 year old, married female, nonsmoker, who comes in today for a flare of her asthma.  She typically takes albuterol p.r.n. However, in the last couple weeks.  They been working in the house and has been a lot of dust.  This past Sunday, 4 days ago, her asthma flared up.  She been trying to use the inhaled albuterol, however, is not working.  Also, her peak flow meter, shows a hundred drop    Review of Systems    General pulmonary use systems otherwise negative.  She did sleep fairly well last night Objective:   Physical Exam Well-developed well-nourished, female, in no acute distress.  Pulse was 70 and regular, respiratory rate 14, and slightly labored pulse ox 98% on room air.  HEENT negative.  Neck was supple.  No adenopathy.  She is not using the accessory muscles of respiration.  Chest shows good breath sounds bilaterally.  Mild wheezing       Assessment & Plan:  Asthma flare.  Plan begin prednisone and home nebulizer q.i.d. Follow-up in 5 days, sooner if any problems

## 2011-02-28 ENCOUNTER — Ambulatory Visit (INDEPENDENT_AMBULATORY_CARE_PROVIDER_SITE_OTHER): Payer: Medicare Other | Admitting: Family Medicine

## 2011-02-28 ENCOUNTER — Ambulatory Visit: Payer: Medicare Other | Admitting: Family Medicine

## 2011-02-28 ENCOUNTER — Encounter: Payer: Self-pay | Admitting: Family Medicine

## 2011-02-28 DIAGNOSIS — J45901 Unspecified asthma with (acute) exacerbation: Secondary | ICD-10-CM

## 2011-02-28 NOTE — Progress Notes (Signed)
  Subjective:    Patient ID: Rebecca [name], female    DOB: [birth year], 65 y.o.   MRN: [id number]  HPI Rebecca [name] is a 65 year old, married female, nonsmoker, who comes in today for follow-up of asthma.  We start her on prednisone last week, 3 tablets for 3 days and then begin a taper.  She is down to 40 mg a day, now, and feels much much better.   Review of Systems    General and pulmonary review of systems otherwise negative Objective:   Physical Exam  Well-developed well-nourished, female, in no acute distress.  Examination of the lung shows increasing breath sounds still wheezing, symmetrical, but markedly diminished from last week.      Assessment & Plan:  Asthma plan decrease nebulizer to b.i.d. Continue 40 mg of prednisone for 5 days and slow taper

## 2011-02-28 NOTE — Patient Instructions (Signed)
Take 40 mg of prednisone daily until next Monday.  Then, a slow taper by taking 30 mg a day for 3 days, 20 mg a day for 3 days, 10 mg a day for 3 days, then 10 mg Monday, Wednesday, Friday, for a two week taper.  Decreased the nebulizer to twice daily for one week and then stop.  Return p.r.n.

## 2011-03-09 ENCOUNTER — Emergency Department (HOSPITAL_COMMUNITY): Payer: Medicare Other

## 2011-03-09 ENCOUNTER — Observation Stay (HOSPITAL_COMMUNITY)
Admission: EM | Admit: 2011-03-09 | Discharge: 2011-03-10 | DRG: 392 | Disposition: A | Discharge status: Home / Self Care | Payer: Medicare Other | Attending: Family Medicine | Admitting: Family Medicine

## 2011-03-09 DIAGNOSIS — R079 Chest pain, unspecified: Principal | ICD-10-CM | POA: Insufficient documentation

## 2011-03-09 DIAGNOSIS — R110 Nausea: Secondary | ICD-10-CM | POA: Insufficient documentation

## 2011-03-09 DIAGNOSIS — R61 Generalized hyperhidrosis: Secondary | ICD-10-CM | POA: Insufficient documentation

## 2011-03-09 DIAGNOSIS — E785 Hyperlipidemia, unspecified: Secondary | ICD-10-CM | POA: Insufficient documentation

## 2011-03-09 DIAGNOSIS — R0609 Other forms of dyspnea: Secondary | ICD-10-CM | POA: Insufficient documentation

## 2011-03-09 DIAGNOSIS — R0989 Other specified symptoms and signs involving the circulatory and respiratory systems: Secondary | ICD-10-CM | POA: Insufficient documentation

## 2011-03-09 DIAGNOSIS — Z7902 Long term (current) use of antithrombotics/antiplatelets: Secondary | ICD-10-CM | POA: Insufficient documentation

## 2011-03-09 DIAGNOSIS — I252 Old myocardial infarction: Secondary | ICD-10-CM | POA: Insufficient documentation

## 2011-03-09 DIAGNOSIS — I2510 Atherosclerotic heart disease of native coronary artery without angina pectoris: Secondary | ICD-10-CM | POA: Insufficient documentation

## 2011-03-09 DIAGNOSIS — IMO0002 Reserved for concepts with insufficient information to code with codable children: Secondary | ICD-10-CM | POA: Insufficient documentation

## 2011-03-09 DIAGNOSIS — R609 Edema, unspecified: Secondary | ICD-10-CM | POA: Insufficient documentation

## 2011-03-09 DIAGNOSIS — Z9861 Coronary angioplasty status: Secondary | ICD-10-CM | POA: Insufficient documentation

## 2011-03-09 DIAGNOSIS — J45909 Unspecified asthma, uncomplicated: Secondary | ICD-10-CM | POA: Insufficient documentation

## 2011-03-09 DIAGNOSIS — Z79899 Other long term (current) drug therapy: Secondary | ICD-10-CM | POA: Insufficient documentation

## 2011-03-09 DIAGNOSIS — R7301 Impaired fasting glucose: Secondary | ICD-10-CM | POA: Insufficient documentation

## 2011-03-09 LAB — COMPREHENSIVE METABOLIC PANEL
ALT: 41 U/L — ABNORMAL HIGH (ref 0–35)
Albumin: 3.8 g/dL (ref 3.5–5.2)
Alkaline Phosphatase: 103 U/L (ref 39–117)
BUN: 22 mg/dL (ref 6–23)
Chloride: 102 mEq/L (ref 96–112)
GFR calc Af Amer: >60 mL/min (ref >60)
Glucose, Bld: 209 mg/dL — ABNORMAL HIGH (ref 70–99)
Potassium: 4.7 mEq/L (ref 3.5–5.1)
Sodium: 136 mEq/L (ref 135–145)
Total Bilirubin: 0.3 mg/dL (ref 0.3–1.2)

## 2011-03-09 LAB — CBC
HCT: 40.7 % (ref 36.0–46.0)
Hemoglobin: 13.8 g/dL (ref 12.0–15.0)
RBC: 4.68 MIL/uL (ref 3.87–5.11)
WBC: 8.9 10*3/uL (ref 4.0–10.5)

## 2011-03-09 LAB — D-DIMER, QUANTITATIVE: D-Dimer, Quant: 0.54 ug/mL-FEU — ABNORMAL HIGH (ref 0.00–0.48)

## 2011-03-09 LAB — CK TOTAL AND CKMB (NOT AT ARMC): Total CK: 94 U/L (ref 7–177)

## 2011-03-09 LAB — TROPONIN I: Troponin I: <0.3 ng/mL (ref <0.30)

## 2011-03-10 ENCOUNTER — Inpatient Hospital Stay (HOSPITAL_COMMUNITY): Payer: Medicare Other

## 2011-03-10 DIAGNOSIS — R079 Chest pain, unspecified: Secondary | ICD-10-CM

## 2011-03-10 LAB — MAGNESIUM: Magnesium: 1.9 mg/dL (ref 1.5–2.5)

## 2011-03-10 LAB — CBC
HCT: 36.6 % (ref 36.0–46.0)
Hemoglobin: 12.6 g/dL (ref 12.0–15.0)
MCHC: 34.4 g/dL (ref 30.0–36.0)
RBC: 4.19 MIL/uL (ref 3.87–5.11)

## 2011-03-10 LAB — COMPREHENSIVE METABOLIC PANEL
ALT: 34 U/L (ref 0–35)
AST: 18 U/L (ref 0–37)
Albumin: 3.3 g/dL — ABNORMAL LOW (ref 3.5–5.2)
Alkaline Phosphatase: 93 U/L (ref 39–117)
Calcium: 9.2 mg/dL (ref 8.4–10.5)
GFR calc Af Amer: >60 mL/min (ref >60)
Potassium: 3.9 mEq/L (ref 3.5–5.1)
Sodium: 139 mEq/L (ref 135–145)
Total Protein: 5.8 g/dL — ABNORMAL LOW (ref 6.0–8.3)

## 2011-03-10 LAB — CARDIAC PANEL(CRET KIN+CKTOT+MB+TROPI)
CK, MB: 2.6 ng/mL (ref 0.3–4.0)
CK, MB: 2.5 ng/mL (ref 0.3–4.0)
Relative Index: INVALID (ref 0.0–2.5)
Relative Index: INVALID (ref 0.0–2.5)
Total CK: 63 U/L (ref 7–177)
Total CK: 51 U/L (ref 7–177)
Troponin I: <0.3 ng/mL (ref <0.30)
Troponin I: <0.3 ng/mL (ref <0.30)

## 2011-03-10 LAB — DIFFERENTIAL
Basophils Absolute: 0 10*3/uL (ref 0.0–0.1)
Basophils Relative: 0 % (ref 0–1)
Lymphocytes Relative: 37 % (ref 12–46)
Monocytes Absolute: 0.6 10*3/uL (ref 0.1–1.0)
Neutro Abs: 4.7 10*3/uL (ref 1.7–7.7)
Neutrophils Relative %: 54 % (ref 43–77)

## 2011-03-10 LAB — HEMOGLOBIN A1C: Hgb A1c MFr Bld: 7.6 % — ABNORMAL HIGH (ref <5.7)

## 2011-03-10 LAB — PROTIME-INR: INR: 0.91 (ref 0.00–1.49)

## 2011-03-10 LAB — GLUCOSE, CAPILLARY

## 2011-03-10 MED ORDER — IOHEXOL 350 MG/ML SOLN
100.0000 mL | Freq: Once | INTRAVENOUS | Status: AC | PRN
  Administered 2011-03-10: 100 mL via INTRAVENOUS

## 2011-03-10 MED ORDER — TECHNETIUM TC 99M TETROFOSMIN IV KIT
30.0000 | PACK | Freq: Once | INTRAVENOUS | Status: AC | PRN
  Administered 2011-03-10: 30 via INTRAVENOUS

## 2011-03-10 MED ORDER — TECHNETIUM TC 99M TETROFOSMIN IV KIT
10.0000 | PACK | Freq: Once | INTRAVENOUS | Status: AC | PRN
  Administered 2011-03-10: 10 via INTRAVENOUS

## 2011-03-11 NOTE — Discharge Summary (Signed)
Rebecca [name], Rebecca [name]               ACCOUNT NO.:  [id number]  MEDICAL RECORD NO.:  [id number]  LOCATION:  2038                         FACILITY:  [facility]  PHYSICIAN:  Pleas Koch, MD        DATE OF BIRTH:  Dec 30, 1945  DATE OF ADMISSION:  03/09/2011 DATE OF DISCHARGE:  03/10/2011                              DISCHARGE SUMMARY   PRIMARY CARE PHYSICIAN:  [name], MD  DISCHARGE DIAGNOSES: 1. Likely gastroesophageal reflux associated with recent needs     prednisone for reactive airway disease, causing atypical chest     pain. 2. Extensive cardiac history as follows.     a.     Coronary artery disease status post stenting right coronary      artery in 2008.     b.     Sepsis myocardial infarction in 1992.     c.     Cardiac cath 2010, in February showing 40% stenosis left      anterior descending and 45% in-stent restenosis right coronary      artery with normal LVEF 55% to 60%. 3. Impaired fasting glucose, likely secondary to prednisone being used     to treat reactive airway disease, A1c in hospital was 7.6. 4. Hyperlipidemia - no lipid panel done in hospital, outpatient risk     factor modification advised. 5. Elevated D-dimer to 0.54 without evidence on CT of pulmonary     embolism. 6. History of skin cancer, currently now stable. 7. History of asthma, currently now stable.  DISCHARGE MEDICATIONS:  Are as follows. 1. Prednisone 10 mg specialized instructions to take for 3 days and     then cut tablet in half and take for 3 days and then stop.  Further     instructions per Dr. Kelle Darting. 2. Fish oil 1200 mg 1 tab daily. 3. Alprazolam 0.5 mg 1 tab daily p.r.n. 4. Diltiazem 180 mg 1 cap q.a.m. 5. Nasonex 2 sprays b.i.d. p.r.n. 6. Artificial tears 1 drop q.2 h. P.r.n. 7. Premarin 1 application topically. 8. Lipitor 80 mg 1 tab q.p.m. 9. Lasix 20 mg 1 tab daily. 10.Nitroglycerin 4 mg sublingual q.5 h. p.r.n. x3. 11.Aspirin 325 mg once daily. 12.Meloxicam 15 mg 1 tab  q.a.m. (the patient is advised follow up with     Dr. Kelle Darting for the same - this can exacerbate heart failure). 13.Omeprazole 20 mg 1 cap q.a.m.  Please note, I have increased     frequency actually to b.i.d., as she is on steroids, which may have     caused GERD. 14.Calcium 600 mg 1 tab b.i.d. 15.Potassium chloride 20 mEq 1 tab daily. 16.Zyrtec 10 mg 1 tab q.a.m. 17.Albuterol inhaler 2 puffs q.4 h. p.r.n. 18.Levothyroxine 50 mcg 1 tab q.a.m. 19.Salicylic acid cream 60% topically once per week. 20.Systane eyedrops in each eye 1 drop q.2 h. p.r.n. 21.Vitamin D3 of 2000 units 1 tab daily.  PERTINENT PROCEDURES DONE: 1. Lexiscan read by Dr. Hulan Saas on March 10, 2011, showed no     evidence of myocardial ischemia or infarction. 2. Normal myocardial perfusion imaging.     a.     Normal left ventricular  wall motion. 3. Estimated QGS ejection fraction 76%.  OTHER PERTINENT IMAGING:  Chest x-ray on March 09, 2011, no acute chest findings; a.  Prominent densities in the left lung base and left cardiac apex, severely chronic changes prior CT angiogram chest, same date showed negative for pulmonary embolus and patchy parenchymal lung density compatible with mild edema.  HISTORY OF PRESENT ILLNESS:  Please see full H and P by Dr. Toniann Fail. This is briefly 65 year old female who came in with nonexertional chest pain while she was sitting on the bed and talking to her husband.  This happened after she was on her exercise bicycle.  She had some dull discomfort with large to resolve 1 tablet nitroglycerin to 325 mg of aspirin at home.  In the ED, she had EKG and cardiac enzymes and then had recurrence of chest pain while in the ED and was given nitro, which also help [place] out and resolve.  She had no abdominal pain, dysuria, discharge, diarrhea, focal signs, or any dizziness, and has been on tapering dose of steroids per Dr. Tawanna Cooler for reactive airway disease secondary to  remodelling of her house where there was a lot of dust. The patient carries a childhood diagnosis of hospital course according to issue.  Vital signs on admission, blood pressure was 120/60, pulse of 60, and temperature of 97.6.  Pertinent positives on admission, S1-S2 heard.  No other added sounds.  EKG showed low voltage QRS and nonspecific ST-T wave changes.  Pertinent positives on labs, WBC was 8.9, hemoglobin 13.8, and hematocrit 40.7.  BUN to creatinine was 22/0.7.  LFTs were normal.  Cardiac enzymes x3 were normal within this hospitalization.  HOSPITAL COURSE: 1. Atypical chest pain.  The patient was kept on NSTEMI protocol     initially per Dr. Shirlee Latch, cardiology did discuss this with admitting     physician.  The patient was kept on aspirin and nitroglycerin.  The     patient had a Lexiscan and Myoview test, the results of which are     above.  I have discussed the patient with Dr. Antoine Poche who fully     agrees that the patient can be safely discharged home.  The patient     likely as per her description has an exacerbation of her GERD  .  As such, I have increased her PPI to     b.i.d.  She is instructed to follow up with Dr. Tawanna Cooler within a     week's time to ensure that; 2. Her chest pain is not returned and that she does not have any     exacerbation of her GERD. 3. Her blood sugars were well-controlled given the fact that she an     A1c of 7.6 now.  I do not think that she is diabetic; however, with     her impaired glucose tolerance have been advised her to check her     blood sugars every morning keep a record and show it to Dr. Tawanna Cooler. 4. Hyperlipidemia.  She will have outpatient risk factor modification,     possible nutrition counseling, as she is mildly obese, and will     need to continue her statin.  The patient should follow up with Dr. Valera Castle in the outpatient office within about a month's time per Dr. Jenene Slicker advise and if she has any further recurrences,  the patient instructed to contact PCP as well as cardiology.  Certainly if she does have further chest pain, she  is to come back to the ED and she was seen on day of discharge, she was doing well.  She had no further chest pain.  She had 3/10 headache, probably likely secondary to nitroglycerin.  PHYSICAL EXAMINATION:  VITAL SIGNS:  Temperature is 97.3, pulse is 69, respirations 20, and blood pressure was 115-130/69-76. GENERAL:  On telemetry, she had normal sinus rhythm.  I did appreciate 216 murmur in the right upper sternal edge radiating to carotids and this may need to be followed as an outpatient.  She was well.  She was sitting up.  She had no chest pain. HEENT:  She had cataract lucency.  She had no pallor.  No icterus. CHEST:  Clinically clear.  She had no reproduction of pain to the chest on pressure. ABDOMEN:  Soft and nontender. EXTREMITIES:  She had trace lower extremity edema. NEUROLOGIC:  She had a Mallampati grade 2.  DISCHARGE PLAN:  She was advised to follow up with above physicians.  It was pleasure taking care of this patient.  Time-35 minutes in d/c planning and coordination         ______________________________ Pleas Koch, MD     JS/MEDQ  D:  03/10/2011  T:  03/10/2011  Job:  [id number]  cc:   Thomas C. Daleen Squibb, MD, [facility]  Electronically Signed by Pleas Koch MD on 03/11/2011 05:20:38 PM

## 2011-03-14 NOTE — Consult Note (Addendum)
Rebecca [name], Rebecca [name] NO.:  [id number]  MEDICAL RECORD NO.:  [id number]  LOCATION:  2038                         FACILITY:  [facility]  PHYSICIAN:  Rebecca Rotunda, MD, FACCDATE OF BIRTH:  01/12/1946  DATE OF CONSULTATION:  03/10/2011 DATE OF DISCHARGE:                                CONSULTATION   PRIMARY CARDIOLOGIST:  Rebecca Sans. Daleen Squibb, MD, [facility]  PRIMARY MEDICAL DOCTOR:  Rebecca Hoes. Tawanna Cooler, MD  CHIEF COMPLAINT:  Chest pain.  HISTORY OF PRESENT ILLNESS:  Rebecca [name] is a pleasant 65 year old female with a history of coronary artery disease, hypertension, hypothyroidism as well as reactive airway disease who has been doing well since her last catheterization in September 2010 which demonstrated nonobstructive disease.  She was recently treated for a reactive airway disease exacerbation with a prednisone taper and has been improving.  She continues to exercise on a regular basis, particularly riding a bike because walking gives her knees trouble.  Yesterday evening, she was sitting on her bed playing with her iPad when suddenly she developed a sharp chest pain in the midsternal region that made her catch her breath.  She took a nitroglycerin and 325 mg of aspirin within 30 seconds and 1-2 minutes later, the pain started subsiding.  She became concerned for what this could represent, so proceeded to the ER where she was still having chest pain and received an additional nitroglycerin which eventually brought it down to 1/10.  Last night, it went back up to 5/10 at rest and nitroglycerin again brought it down.  Now, she feels fine without symptoms.  She has had no exertional symptoms and has never had any pain like this before.  It is different than her prior MI and chest pain requiring stenting in 2008.  Cardiac enzymes have been negative x2, and EKG is without acute changes.  CT angio of the chest has been negative for PE.  PAST MEDICAL HISTORY: 1. Coronary artery  disease.     a.     Out-of-hospital MI in 1992, treated medically.     b.     Bare-metal stent placement to the RCA in February 2008.     c.     Last catheterization in September 2010 showing      nonobstructive disease with 40% in the LAD and 40-50% in-stent      restenosis of the RCA, non-flow limiting.     d.     Normal LV function by cath in September 2010. 2. Hypertension. 3. GERD and hiatal hernia. 4. Irritable bowel syndrome. 5. Hypothyroidism. 6. Osteoarthritis status post bilateral TKA. 7. Melanoma of the right thigh, excised, followed by Dermatology. 8. Reactive airway disease and allergic rhinitis, recently treated for     exacerbation with prednisone taper.  PAST SURGICAL HISTORY:  As above, and also cholecystectomy, lumpectomy, and BTL.  MEDICATIONS:  Aspirin 325 mg daily; Os-Cal; vitamin D; diltiazem 180 mg daily; Premarin; Flonase; Lasix 20 mg daily; heparin; levothyroxine 50 mcg daily; Claritin; nitroglycerin paste; Lovaza 1 g daily; Protonix 40 mg daily; potassium chloride 20 mEq daily; and four more days of her prednisone taper, 10 mg for 2 days and 5 mg for  2 days; Crestor 40 mg daily.  ALLERGIES:  SULFA.  SOCIAL HISTORY:  Rebecca [name] lives in [place].  She is a retired Engineer, civil (consulting). She is married.  She denies tobacco use.  She is an occasional alcohol drinker.  She rides her bike on a regular basis but denies the ability to do much walking secondary to her knee placements.  FAMILY HISTORY:  Mother had coronary artery disease, diagnosed in her 76s but lived into her 23s.  Her 82 year old daughter died of metastatic melanoma.  She has a sister who had breast cancer.  REVIEW OF SYSTEMS:  No nausea, vomiting, diarrhea, bright red blood per rectum, melena, or hematemesis.  No fevers or chills.  All other systems reviewed and otherwise negative.  LABORATORY DATA:  WBC 8.7, hemoglobin 12.6, hematocrit 36.6, platelet count 250.  Sodium 139, potassium 2.9, chloride  104, CO2 of 27, glucose 148, BUN 19, creatinine 0.69.  D-dimer was 0.54.  Lipase was negative. Cardiac enzymes x2 were negative.  EKG:  Normal sinus rhythm with sinus brady and low voltage with T-wave inversion in lead III which is not new, known from 2009.  RADIOLOGIC STUDIES: 1. CT angio was negative for PE.  Patchy parenchymal lung densities,     nonspecific. 2. Chest x-ray showed prominent densities near the left lower     base/left cardiac apex, question chronic lung changes and scarring.  PHYSICAL EXAMINATION:  VITAL SIGNS:  Temperature 97.3, pulse 67, respirations 12, blood pressure 130/70, pulse ox 94% on room air. GENERAL:  This is a pleasant well-appearing white female, in no acute distress. HEENT:  Normocephalic and atraumatic with extraocular movements intact. Clear sclerae.  Nares are without discharge. NECK:  Supple without carotid bruits or JVD. HEART:  Auscultation of the heart reveals regular rate and rhythm with S1 and S2 without murmurs, rubs, or gallops. LUNGS:  Slightly coarse at the bases, left greater than right, but otherwise clear and free of rales or rhonchi.  No wheezing.  Breathing is unlabored.ABDOMEN:  Soft, nontender, nondistended with positive bowel sounds.  No rebound or guarding. EXTREMITIES:  Trace sock line edema but otherwise, no clubbing or cyanosis.  Pedal pulses 2+ bilaterally. NEUROLOGIC:  She is alert and oriented x3 and responds to questions appropriately with a normal affect.  ASSESSMENT/PLAN:  The patient was seen and examined by Dr. Antoine [name] and myself.  This is a 65 year old lady with a history of hypertension and coronary artery disease and gastroesophageal reflux disease whom we are asked to evaluate for chest pain.  At this time, her chest pain is felt atypical in etiology.  It is not like her prior angina, and she has had no exertional symptoms.  Cardiac enzymes and EKG have remained unremarkable.  It was relieved with  nitroglycerin, but this does not necessarily prove a cardiac etiology -- favor possibly GI.  We will plan for an inpatient Lexiscan Myoview today and I have spoken with the nuclear department who can accommodate her.  She will be deemed suitable for discharge if this is negative, with followup in our office as scheduled.  DATA:  Done at [facility]     Ronie Spies, P.A.C.   ______________________________ Rebecca Rotunda, MD, [facility]    DD/MEDQ  D:  03/10/2011  T:  03/10/2011  Job:  [id number]  cc:   [name], MD Rebecca Sans. Daleen Squibb, MD, [facility]  Electronically Signed by Ronie Spies  on 03/14/2011 07:00:23 PM Electronically Signed by Rebecca Rotunda MD [facility] on 04/27/2011 01:30:19 PM

## 2011-03-16 ENCOUNTER — Ambulatory Visit (INDEPENDENT_AMBULATORY_CARE_PROVIDER_SITE_OTHER): Payer: Medicare Other | Admitting: Family Medicine

## 2011-03-16 ENCOUNTER — Encounter: Payer: Self-pay | Admitting: Family Medicine

## 2011-03-16 VITALS — BP 124/78 | Temp 98.5°F | Wt 154.0 lb

## 2011-03-16 DIAGNOSIS — J45901 Unspecified asthma with (acute) exacerbation: Secondary | ICD-10-CM

## 2011-03-16 MED ORDER — BECLOMETHASONE DIPROPIONATE 80 MCG/ACT IN AERS: 1.0000 | INHALATION_SPRAY | RESPIRATORY_TRACT | Status: DC | PRN

## 2011-03-16 NOTE — Progress Notes (Signed)
  Subjective:    Patient ID: Rebecca [name], female    DOB: 13-Jul-1946, 65 y.o.   MRN: [id number]  HPI  Rebecca [name] is a 65 year old female, nonsmoker, who comes in today following a hospitalization for evaluation of chest pain.  She has a history of underlying coronary disease and presented to emerge from with atypical chest pain.  Initial evaluation was negative however, because of her risk factors.  She was admitted, observed, study and felt to be noncardiac chest pain.  She was discharged and has done well.  Her blood sugar A1c were elevated in the hospital.  However, we had her on prednisone for the past 4 weeks because of flare of her asthma.  She took her last half tablet of prednisone today, but continues to wheeze.  She states this happened once before, and we had to put on an inhaled steroid for couple months to get her airways to quiet down.  Review of Systems  General cardiac, pulmonary GU systems otherwise negative   Objective:   Physical Exam Well-developed well-nourished, female, in no acute distress.  Examination of lung shows symmetrical.  Breath sounds inspiratory and expiratory wheezing       Assessment & Plan:  Asthma unresolved with oral prednisone.  Plan transition to inhaled steroid.  Follow-up in two weeks

## 2011-03-16 NOTE — Patient Instructions (Signed)
One puff of albuterol in the morning and evening, followed by one puff of the Qvar 80.  Return in two weeks for follow-up

## 2011-03-21 NOTE — H&P (Signed)
Rebecca [name], Rebecca [name]               ACCOUNT NO.:  [id number]  MEDICAL RECORD NO.:  [id number]  LOCATION:  [facility]                         FACILITY:  [facility]  PHYSICIAN:  Eduard Clos, MDDATE OF BIRTH:  1946-04-14  DATE OF ADMISSION:  03/09/2011 DATE OF DISCHARGE:                             HISTORY & PHYSICAL   PRIMARY CARE PHYSICIAN:  [name], MD  PRIMARY CARDIOLOGIST:  Jesse Sans. Wall, MD, [facility]  CHIEF COMPLAINT:  Chest pain.  HISTORY OF PRESENTING ILLNESS:  A 65 year old female with known history of CAD status post stenting in RCA in 2008, history of hypertension, hyperlipidemia, hypothyroidism, presented with complaint of chest pain. The patient states that she was doing fine around 9 o'clock.  There was some chest pain which was substernal radiating to her neck.  It was intense for 2 minutes.  She took an aspirin and nitroglycerin after which she felt better.  She was not diaphoretic, not short of breath.After that, they called EMS and the patient was brought ER.  In the ER, again, the patient developed some chest pain, was given again 2 nitro after which it subsided.  Presently, the patient has some dull discomfort but the pain has largely resolved.  The patient denies any shortness of breath.  Denies any nausea, vomiting, diaphoresis, any palpitation, dizziness, or loss of consciousness.  In the ER, the patient had EKG, cardiac enzymes which were negative.  Chest x-ray did not show anything acute.  CT chest was done.  Angio protocol.  I discussed with Dr. Lowella Dandy, radiologist who states there is no PE.  There is calcification of the pulmonary arteries and may have some pulmonary edema, mild  The patient denies any nausea, vomiting, abdominal pain, dysuria, discharge, diarrhea.  Denies any dizziness, loss of function, or any focal deficit.  The patient states that she is on a tapering dose of steroids for shortness of breath which developed 2 weeks ago and had gone  to Dr. Tawanna Cooler who put her on a tapering dose of steroids for reactive airway disease.  PAST MEDICAL HISTORY: 1. History of CAD status post stenting in the RCA in 2008. 2. History of MI in 1992. 3. History of hypertension. 4. History of hyperlipidemia. 5. History of hypothyroidism. 6. History of skin cancer, melanoma. 7. History of asthma.  PAST SURGICAL HISTORY: 1. Bilateral knee replacement. 2. CAD status post stenting, has had again a cardiac cath in 2010     which showed 40% stenosis in the LAD and 40-50% in-stent stenosis     in RCA, normal LV function with EF of 55-60%. 3. History of cholecystectomy. 4. History of lumpectomy. 5. History of tubal ligation.  MEDICATIONS PER ADMISSION:  The patient is on: 1. Systane lubricant eyedrops. 2. Salex (salicylic acid) cream. 3. Nitroglycerin p.r.n. 4. Levothyroxine 50 mcg p.o. daily. 5. Alprazolam 0.5 mg p.o. daily. 6. Mobic 15 mg p.o. daily. 7. Albuterol inhaler. 8. Lipitor 80 mg p.o. daily. 9. Potassium chloride 20 mg p.o. daily. 10.Furosemide 20 mg p.o. daily. 11.Fish oil 1200 mg daily. 12.Zyrtec 10 mg p.o. daily. 13.Diltiazem CD 180 mg p.o. daily. 14.Calcium 600 mg p.o. twice daily. 15.Vitamin B3 2000 units p.o. daily.  16.Omeprazole 20 mg p.o. daily. 17.Nasonex nasal spray. 18.Premarin conjugated cream topically. 19.Aspirin 325 mg p.o. daily. 20.Prednisone tapering dose.  ALLERGIES:  SULFA.  FAMILY HISTORY:  Positive for coronary artery disease especially in the female members of the family.  SOCIAL HISTORY:  The patient is married, lives with her husband.  Denies smoking cigarettes, drinking alcohol, or using illegal drugs.  REVIEW OF SYSTEMS:  As present in history of presenting illness. Nothing else significant.  PHYSICAL EXAMINATION:  GENERAL:  The patient examined at bedside, not in acute distress. VITAL SIGNS:  Blood pressure is 120/60, pulse is 60 per minute, temperature 97.6, respirations 18 per minute,  O2 sat 100%.  HEENT: Anicteric.  No pallor.  No discharge from ears, eyes, nose, or mouth. CHEST:  Bilateral air entry present.  No rhonchi.  No crepitation. HEART:  S1, S2 heard. ABDOMEN:  Soft, nontender.  Bowel sounds heard. CNS:  The patient is alert, awake, oriented to time, place, and person. Moves upper and lower extremities 5/5. EXTREMITIES:  Peripheral pulses felt.  No edema.  LABORATORY DATA:  EKG shows normal sinus rhythm with low-voltage QRS with nonspecific ST changes.  Heart rate is around 75 beats per minute. Chest x-ray shows no acute chest finding, prominent densities near the left lung base and left cardiac apex, could be related to chronic changes and scarring.  CT angio chest was done. I did discuss with radiologist, Dr. Lowella Dandy who said there is no PE.  There is calcification of coronary disease with some possible mild pulmonary edema.  CBC:  WBC is 8.9, hemoglobin is 13.8, hematocrit is 40.7, platelets 281, D-dimer is 0.54.  Complete metabolic panel:  Sodium 136, potassium 4.7, chloride 102, carbon dioxide 24, glucose 209, BUN 22, creatinine 0.7, total bilirubin is 0.3, alkaline phosphatase 103, AST 28, ALT 41, total protein 6.7, albumin 3.8, calcium 9.5, CK 94, MB 2.8, troponin less than 0.3.  ASSESSMENT: 1. Chest pain with a history of coronary artery disease status post     right coronary artery bare metal stenting in 2008. 2. Recent use of prednisone for reactive airway disease with history     of bronchial asthma. 3. History of MI in 1992. 4. Hyperglycemia probably from recent use of steroids. 5. History of hypothyroidism. 6. History of hyperlipidemia. 7. History of asthma.  PLAN: 1. At this time, admit the patient to telemetry. 2. For her chest pain, I did discuss with Dr. Shirlee Latch cardiologist on-     call for [place] who advised at this time to cycle cardiac markers     and rule-out acute coronary syndrome.  To keep the patient n.p.o.     past midnight  for possible stress test in a.m.  At this time, the     patient will be on nitroglycerin paste.  I will start IV heparin     and also continue with the aspirin and regular home medications for     now.  I will also add a lipase level. 3. Hyperglycemia probably from use of prednisone recently.  I am going     to check hemoglobin A1c. 4. Further recommendation based on the tests ordered and clinic     course.     Eduard Clos, MD     ANK/MEDQ  D:  03/10/2011  T:  03/10/2011  Job:  [id number]  cc:   [name], MD Jesse Sans. Daleen Squibb, MD, [facility]  Electronically Signed by Midge Minium MD on 03/21/2011 09:33:09 AM

## 2011-03-29 ENCOUNTER — Ambulatory Visit (INDEPENDENT_AMBULATORY_CARE_PROVIDER_SITE_OTHER): Payer: Medicare Other | Admitting: Cardiology

## 2011-03-29 ENCOUNTER — Encounter: Payer: Self-pay | Admitting: Cardiology

## 2011-03-29 VITALS — BP 130/80 | HR 93 | Ht 60.0 in | Wt 153.0 lb

## 2011-03-29 DIAGNOSIS — I2510 Atherosclerotic heart disease of native coronary artery without angina pectoris: Secondary | ICD-10-CM

## 2011-03-29 NOTE — Assessment & Plan Note (Signed)
No recurrent pain. Reinforced the use of nitroglycerin when in doubt. Advised her to obtain pulmonary with her reactive airway disease.

## 2011-03-29 NOTE — Progress Notes (Signed)
HPI Rebecca [name] returns post hospital for followup. Presented with severe sharp pain across his chest up into her neck which did not respond to nitroglycerin. She eventually got relief in the emergency room with 2 additional nitroglycerin. She ruled out for MI. Stress Myoview showed an ejection fraction 76% with no ischemia or infarction. CT angiogram was negative for pulmonary embolus. Was felt that her discomfort was from an exacerbation of her reflux. She is not convinced.  She's having a lot of problems with reactive lung disease.   EKG was not repeated today. Meds reviewed and she is compliant. Past Medical History  Diagnosis Date  . GERD (gastroesophageal reflux disease)   . MI (myocardial infarction)   . CAD (coronary artery disease)   . Chest pain, atypical   . Hyperlipidemia   . Hypertension   . Fatigue   . Hypothyroidism   . Fatigue   . Edema   . Asthma     extrinsic  . Osteoarthritis   . Keratosis, seborrheic   . Labyrinthitis   . Skin cancer   . IBS (irritable bowel syndrome)     hx    Past Surgical History  Procedure Date  . Cholecystectomy   . Tubal ligation   . Knee arthroplasty     total right  02-28-2010 Dr Ollen Gross    Family History  Problem Relation Age of Onset  . Melanoma    . Heart disease    . Breast cancer      History   Social History  . Marital Status: Married    Spouse Name: N/A    Number of Children: N/A  . Years of Education: N/A   Occupational History  . Not on file.   Social History Main Topics  . Smoking status: Never Smoker   . Smokeless tobacco: Not on file  . Alcohol Use: Not on file  . Drug Use: Not on file  . Sexually Active: Not on file   Other Topics Concern  . Not on file   Social History Narrative  . No narrative on file    Allergies  Allergen Reactions  . Sulfonamide Derivatives     REACTION: Levonne Spiller syndrome    Current Outpatient Prescriptions  Medication Sig Dispense Refill  .  acetaminophen (TYLENOL) 325 MG tablet Take 650 mg by mouth every 6 (six) hours as needed.        Marland Kitchen albuterol (PROVENTIL HFA) 108 (90 BASE) MCG/ACT inhaler Inhale 2 puffs into the lungs every 6 (six) hours as needed for wheezing.  7 g  3  . albuterol (PROVENTIL) (2.5 MG/3ML) 0.083% nebulizer solution Take 2.5 mg by nebulization 4 (four) times daily.  75 mL  12  . ALPRAZolam (XANAX) 0.5 MG tablet Take 0.5 mg by mouth at bedtime as needed.        Marland Kitchen aspirin 325 MG tablet Take 325 mg by mouth daily.        Marland Kitchen atorvastatin (LIPITOR) 80 MG tablet Take 80 mg by mouth daily.        . beclomethasone (QVAR) 80 MCG/ACT inhaler Inhale 1 puff into the lungs as needed.  1 Inhaler  12  . calcium carbonate (OS-CAL) 600 MG TABS Take 600 mg by mouth 2 (two) times daily with a meal.        . cetirizine (ZYRTEC) 10 MG tablet Take 10 mg by mouth daily.        . Cholecalciferol (VITAMIN D) 2000 UNITS CAPS Take 1  capsule by mouth daily.        Marland Kitchen conjugated estrogens (PREMARIN) vaginal cream Place vaginally. weekly       . diltiazem (CARDIZEM CD) 180 MG 24 hr capsule Take 180 mg by mouth daily.        . furosemide (LASIX) 20 MG tablet Take 20 mg by mouth daily.       Marland Kitchen levothyroxine (SYNTHROID, LEVOTHROID) 50 MCG tablet Take 50 mcg by mouth daily.        . mometasone (NASONEX) 50 MCG/ACT nasal spray 2 sprays by Nasal route as needed.        . Naphazoline-Polyethyl Glycol (EYE DROPS) 0.012-0.2 % SOLN Apply to eye. Artifical eye drops       . nitroGLYCERIN (NITROSTAT) 0.4 MG SL tablet Place 0.4 mg under the tongue every 5 (five) minutes as needed.        . Omega-3 Fatty Acids (FISH OIL) 1200 MG CAPS Take by mouth.        Marland Kitchen omeprazole (PRILOSEC) 20 MG capsule Take 20 mg by mouth 2 (two) times daily.        Bertram Gala Glycol-Propyl Glycol (SYSTANE) 0.4-0.3 % SOLN Apply to eye.        . potassium chloride SA (K-DUR,KLOR-CON) 20 MEQ tablet Take 20 mEq by mouth daily as needed.        . Salicylic Acid-Cleanser 6 % (CREAM) KIT  Apply topically.        Marland Kitchen zolpidem (AMBIEN) 10 MG tablet Take 1 tablet (10 mg total) by mouth at bedtime as needed.  30 tablet  3    ROS Negative other than HPI.   PE General Appearance: well developed, well nourished in no acute distress HEENT: symmetrical face, PERRLA, good dentition  Neck: no JVD, thyromegaly, or adenopathy, trachea midline Chest: symmetric without deformity Cardiac: PMI non-displaced, RRR, normal S1, S2, no gallop or murmur Lung: clear to ausculation and percussion Vascular: all pulses full without bruits  Abdominal: nondistended, nontender, good bowel sounds, no HSM, no bruits Extremities: no cyanosis, clubbing or edema, no sign of DVT, no varicosities  Skin: normal color, no rashes Neuro: alert and oriented x 3, non-focal Pysch: normal affect Filed Vitals:   03/29/11 1215  BP: 130/80  Pulse: 93  Height: 5' (1.524 m)  Weight: 153 lb (69.4 kg)    EKG  Labs and Studies Reviewed.   Lab Results  Component Value Date   WBC 8.7 03/10/2011   HGB 12.6 03/10/2011   HCT 36.6 03/10/2011   MCV 87.4 03/10/2011   PLT 260 03/10/2011      Chemistry      Component Value Date/Time   NA 139 03/10/2011 0250   K 3.9 03/10/2011 0250   CL 104 03/10/2011 0250   CO2 27 03/10/2011 0250   BUN 19 03/10/2011 0250   CREATININE 0.69 03/10/2011 0250      Component Value Date/Time   CALCIUM 9.2 03/10/2011 0250   ALKPHOS 93 03/10/2011 0250   AST 18 03/10/2011 0250   ALT 34 03/10/2011 0250   BILITOT 0.2* 03/10/2011 0250       Lab Results  Component Value Date   CHOL 150 04/05/2010   CHOL 176 02/15/2009   CHOL 159 02/04/2008   Lab Results  Component Value Date   HDL 50.90 04/05/2010   HDL 16.10 02/15/2009   HDL 45.4 02/04/2008   Lab Results  Component Value Date   LDLCALC 81 04/05/2010   LDLCALC 78 02/15/2009  LDLCALC 88 02/04/2008   Lab Results  Component Value Date   TRIG 93.0 04/05/2010   TRIG 156.0* 02/15/2009   TRIG 127 02/04/2008   Lab Results  Component Value Date    CHOLHDL 3 04/05/2010   CHOLHDL 3 02/15/2009   CHOLHDL 3.5 CALC 02/04/2008   Lab Results  Component Value Date   HGBA1C 7.6* 03/10/2011   Lab Results  Component Value Date   ALT 34 03/10/2011   AST 18 03/10/2011   ALKPHOS 93 03/10/2011   BILITOT 0.2* 03/10/2011   Lab Results  Component Value Date   TSH 1.33 04/05/2010

## 2011-03-29 NOTE — Patient Instructions (Signed)
Your physician recommends that you schedule a follow-up appointment in: 1 year with Dr. Wall  

## 2011-03-30 ENCOUNTER — Encounter: Payer: Self-pay | Admitting: Family Medicine

## 2011-03-30 ENCOUNTER — Ambulatory Visit (INDEPENDENT_AMBULATORY_CARE_PROVIDER_SITE_OTHER): Payer: Medicare Other | Admitting: Family Medicine

## 2011-03-30 VITALS — BP 140/84 | Temp 98.3°F | Wt 154.0 lb

## 2011-03-30 DIAGNOSIS — J45901 Unspecified asthma with (acute) exacerbation: Secondary | ICD-10-CM

## 2011-03-30 DIAGNOSIS — Z23 Encounter for immunization: Secondary | ICD-10-CM

## 2011-03-30 MED ORDER — MONTELUKAST SODIUM 10 MG PO TABS: 10.0000 mg | ORAL_TABLET | Freq: Every day | ORAL | Status: DC

## 2011-03-30 NOTE — Patient Instructions (Signed)
Continue your current treatment program.  Add Singulair 10 mg daily.  Return in 4 weeks for follow-up

## 2011-03-30 NOTE — Progress Notes (Signed)
  Subjective:    Patient ID: Rebecca [name], female    DOB: 1946/05/27, 65 y.o.   MRN: [id number]  HPI Rebecca [name] is a 65 year old, married female, nonsmoker, who comes in today for follow-up of asthma.  She has stopped her nebulizer.  She is taking albuterol two puffs b.i.d. And Qvar 80       One pack b.i.d. And feels much better.  However, she still coughing a little bit.  Review of systems otherwise negative   Review of Systems    General review of systems otherwise negative Objective:   Physical Exam Well-developed well-nourished, female, in no acute distress.  Examination of the lungs shows very minimal late expiratory wheezing       Assessment & Plan:  Asthma much improved, but not resolved.  Add Singulair 10 mg daily.  Pulmonary consult if symptoms persist

## 2011-04-10 ENCOUNTER — Other Ambulatory Visit: Payer: Self-pay | Admitting: *Deleted

## 2011-04-10 MED ORDER — POTASSIUM CHLORIDE CRYS ER 20 MEQ PO TBCR: 20.0000 meq | EXTENDED_RELEASE_TABLET | Freq: Every day | ORAL | Status: DC

## 2011-05-02 ENCOUNTER — Ambulatory Visit: Payer: Medicare Other | Admitting: Family Medicine

## 2011-05-16 ENCOUNTER — Ambulatory Visit (INDEPENDENT_AMBULATORY_CARE_PROVIDER_SITE_OTHER): Payer: Medicare Other | Admitting: Family Medicine

## 2011-05-16 ENCOUNTER — Encounter: Payer: Self-pay | Admitting: Family Medicine

## 2011-05-16 DIAGNOSIS — J45909 Unspecified asthma, uncomplicated: Secondary | ICD-10-CM

## 2011-05-16 DIAGNOSIS — F5101 Primary insomnia: Secondary | ICD-10-CM

## 2011-05-16 DIAGNOSIS — G4700 Insomnia, unspecified: Secondary | ICD-10-CM

## 2011-05-16 DIAGNOSIS — E039 Hypothyroidism, unspecified: Secondary | ICD-10-CM

## 2011-05-16 DIAGNOSIS — I2510 Atherosclerotic heart disease of native coronary artery without angina pectoris: Secondary | ICD-10-CM

## 2011-05-16 DIAGNOSIS — E785 Hyperlipidemia, unspecified: Secondary | ICD-10-CM

## 2011-05-16 DIAGNOSIS — M1990 Unspecified osteoarthritis, unspecified site: Secondary | ICD-10-CM

## 2011-05-16 DIAGNOSIS — K219 Gastro-esophageal reflux disease without esophagitis: Secondary | ICD-10-CM

## 2011-05-16 DIAGNOSIS — R609 Edema, unspecified: Secondary | ICD-10-CM

## 2011-05-16 DIAGNOSIS — J45901 Unspecified asthma with (acute) exacerbation: Secondary | ICD-10-CM

## 2011-05-16 DIAGNOSIS — Z23 Encounter for immunization: Secondary | ICD-10-CM

## 2011-05-16 DIAGNOSIS — I10 Essential (primary) hypertension: Secondary | ICD-10-CM

## 2011-05-16 LAB — LIPID PANEL
HDL: 62.3 mg/dL (ref >39.00)
LDL Cholesterol: 88 mg/dL (ref 0–99)
Total CHOL/HDL Ratio: 3
VLDL: 28.8 mg/dL (ref 0.0–40.0)

## 2011-05-16 LAB — BASIC METABOLIC PANEL
BUN: 18 mg/dL (ref 6–23)
CO2: 30 mEq/L (ref 19–32)
Chloride: 103 mEq/L (ref 96–112)
GFR: 78.28 mL/min (ref >60.00)
Glucose, Bld: 127 mg/dL — ABNORMAL HIGH (ref 70–99)
Potassium: 5.2 mEq/L — ABNORMAL HIGH (ref 3.5–5.1)

## 2011-05-16 LAB — HEPATIC FUNCTION PANEL
ALT: 33 U/L (ref 0–35)
Bilirubin, Direct: 0.1 mg/dL (ref 0.0–0.3)
Total Bilirubin: 0.4 mg/dL (ref 0.3–1.2)

## 2011-05-16 LAB — CBC WITH DIFFERENTIAL/PLATELET
Basophils Relative: 0.4 % (ref 0.0–3.0)
Eosinophils Relative: 2.1 % (ref 0.0–5.0)
HCT: 42.9 % (ref 36.0–46.0)
Lymphs Abs: 2.1 10*3/uL (ref 0.7–4.0)
Monocytes Relative: 7.3 % (ref 3.0–12.0)
Neutrophils Relative %: 56.4 % (ref 43.0–77.0)
Platelets: 291 10*3/uL (ref 150.0–400.0)
RBC: 4.75 Mil/uL (ref 3.87–5.11)
WBC: 6.1 10*3/uL (ref 4.5–10.5)

## 2011-05-16 LAB — TSH: TSH: 1.38 u[IU]/mL (ref 0.35–5.50)

## 2011-05-16 LAB — POCT URINALYSIS DIPSTICK
Glucose, UA: NEGATIVE
Ketones, UA: NEGATIVE
Leukocytes, UA: NEGATIVE
Protein, UA: NEGATIVE

## 2011-05-16 MED ORDER — ALBUTEROL SULFATE HFA 108 (90 BASE) MCG/ACT IN AERS: 2.0000 | INHALATION_SPRAY | Freq: Four times a day (QID) | RESPIRATORY_TRACT | Status: DC | PRN

## 2011-05-16 MED ORDER — ALPRAZOLAM 0.5 MG PO TABS: 0.5000 mg | ORAL_TABLET | Freq: Every evening | ORAL | Status: DC | PRN

## 2011-05-16 MED ORDER — ZOLPIDEM TARTRATE 10 MG PO TABS: 10.0000 mg | ORAL_TABLET | Freq: Every evening | ORAL | Status: DC | PRN

## 2011-05-16 MED ORDER — MONTELUKAST SODIUM 10 MG PO TABS: 10.0000 mg | ORAL_TABLET | Freq: Every day | ORAL | Status: DC

## 2011-05-16 MED ORDER — FUROSEMIDE 20 MG PO TABS: 20.0000 mg | ORAL_TABLET | Freq: Every day | ORAL | Status: DC

## 2011-05-16 MED ORDER — BECLOMETHASONE DIPROPIONATE 80 MCG/ACT IN AERS: 1.0000 | INHALATION_SPRAY | RESPIRATORY_TRACT | Status: DC | PRN

## 2011-05-16 MED ORDER — DILTIAZEM HCL ER COATED BEADS 180 MG PO CP24: 180.0000 mg | ORAL_CAPSULE | Freq: Every day | ORAL | Status: DC

## 2011-05-16 MED ORDER — OMEPRAZOLE 20 MG PO CPDR: 20.0000 mg | DELAYED_RELEASE_CAPSULE | Freq: Two times a day (BID) | ORAL | Status: DC

## 2011-05-16 MED ORDER — ATORVASTATIN CALCIUM 80 MG PO TABS: 80.0000 mg | ORAL_TABLET | Freq: Every day | ORAL | Status: DC

## 2011-05-16 MED ORDER — ESTROGENS, CONJUGATED 0.625 MG/GM VA CREA: TOPICAL_CREAM | VAGINAL | Status: DC

## 2011-05-16 MED ORDER — POTASSIUM CHLORIDE CRYS ER 20 MEQ PO TBCR: 20.0000 meq | EXTENDED_RELEASE_TABLET | Freq: Every day | ORAL | Status: DC

## 2011-05-16 MED ORDER — NITROGLYCERIN 0.4 MG SL SUBL: 0.4000 mg | SUBLINGUAL_TABLET | SUBLINGUAL | Status: DC | PRN

## 2011-05-16 MED ORDER — LEVOTHYROXINE SODIUM 50 MCG PO TABS: 50.0000 ug | ORAL_TABLET | Freq: Every day | ORAL | Status: DC

## 2011-05-16 NOTE — Progress Notes (Signed)
Addended by: Bonnye Fava on: 05/16/2011 09:48 AM   Modules accepted: Orders

## 2011-05-16 NOTE — Progress Notes (Signed)
Addended by: Kern Reap B on: 05/16/2011 09:42 AM   Modules accepted: Orders

## 2011-05-16 NOTE — Progress Notes (Signed)
  Subjective:    Patient ID: Rebecca [name], female    DOB: [birth year], 65 y.o.   MRN: [id number]  HPI Rebecca [name] is a 65 year old, married female, nonsmoker, who comes in today for a Medicare wellness examination because of a history of allergic rhinitis, asthma, anxiety, hyperlipidemia, postmenopausal vaginal dryness, hypertension, hypothyroidism, history of coronary disease, reflux, esophagitis, and sleep dysfunction.  Current medications reviewed in detail, and there have been no changes C. The med list.  She was recently hospitalized for chest pain.  Evaluation in about a month ago.  Cardiac studies were all normal.  They, think it's GI, although she's not had a GI evaluation.  Advised her to see her gastroenterologist.  She gets regular eye care, hearing normal, regular dental care, cognitive function, normal, activity, daily living, normal.  She walks on a regular basis, although safety reviewed.  No issues identified, no guns in the house, she does have a healthcare power of attorney and living will.  Tetanus 2003, Pneumovax, x 2, shingles 2011, seasonal flu shot 2012.   Review of Systems  Constitutional: Negative.   HENT: Negative.   Eyes: Negative.   Respiratory: Negative.   Cardiovascular: Negative.   Gastrointestinal: Negative.   Genitourinary: Negative.   Musculoskeletal: Negative.   Neurological: Negative.   Hematological: Negative.   Psychiatric/Behavioral: Negative.        Objective:   Physical Exam  Constitutional: She appears well-developed and well-nourished.  HENT:  Head: Normocephalic and atraumatic.  Right Ear: External ear normal.  Left Ear: External ear normal.  Nose: Nose normal.  Mouth/Throat: Oropharynx is clear and moist.  Eyes: EOM are normal. Pupils are equal, round, and reactive to light.  Neck: Normal range of motion. Neck supple. No thyromegaly present.  Cardiovascular: Normal rate, regular rhythm, normal heart sounds and intact distal pulses.  Exam  reveals no gallop and no friction rub.   No murmur heard. Pulmonary/Chest: Effort normal and breath sounds normal.  Abdominal: Soft. Bowel sounds are normal. She exhibits no distension and no mass. There is no tenderness. There is no rebound.  Genitourinary:       Bilateral breast exam normal.  Left nipple , chronically inverted mammogram 4 months ago, normal  Musculoskeletal: Normal range of motion. She exhibits edema.       Trace edema both lower extremities  Lymphadenopathy:    She has no cervical adenopathy.  Neurological: She is alert. She has normal reflexes. No cranial nerve deficit. She exhibits normal muscle tone. Coordination normal.  Skin: Skin is warm and dry.  Psychiatric: She has a normal mood and affect. Her behavior is normal. Judgment and thought content normal.          Assessment & Plan:  Healthy female.  Allergic rhinitis.  Continue current medication.  Asthma.  Continue current medication.  Hyperlipidemia.  Continue Lipitor 80 nightly  Postmenopausal vaginal dryness.  Continue Premarin vaginal cream weekly.  Hypertension.  Continue current medications.  Hypothyroidism.  Continue current medications.  Reflux esophagitis.  Continue Prilosec 20 mg b.i.d. Consult with GI because of the chest pain, which required hospitalization.  Sleep dysfunction, Ambien 10 mg nightly p.r.n.  Occasional anxiety.  Xanax .5 daily p.r.n.  Return in one year, sooner if any problems

## 2011-05-16 NOTE — Patient Instructions (Signed)
Continue your current medications.  Follow-up for general physical examination in one year, sooner if any problems

## 2011-05-26 ENCOUNTER — Telehealth: Payer: Self-pay | Admitting: Family Medicine

## 2011-05-26 NOTE — Telephone Encounter (Signed)
[facility] called needing clarification on directions for Premarin vaginal cream

## 2011-05-29 NOTE — Telephone Encounter (Signed)
Re faxed with written directions

## 2011-06-02 ENCOUNTER — Telehealth: Payer: Self-pay | Admitting: *Deleted

## 2011-06-02 NOTE — Telephone Encounter (Signed)
Pharmacy calling for clarification of prescriptions.

## 2011-09-08 DIAGNOSIS — H40249 Residual stage of angle-closure glaucoma, unspecified eye: Secondary | ICD-10-CM | POA: Diagnosis not present

## 2011-09-18 ENCOUNTER — Other Ambulatory Visit: Payer: Self-pay | Admitting: Family Medicine

## 2011-09-18 DIAGNOSIS — Z1231 Encounter for screening mammogram for malignant neoplasm of breast: Secondary | ICD-10-CM

## 2011-10-11 ENCOUNTER — Ambulatory Visit
Admission: RE | Admit: 2011-10-11 | Discharge: 2011-10-11 | Disposition: A | Discharge status: Home / Self Care | Payer: Medicare Other | Source: Ambulatory Visit | Attending: Family Medicine | Admitting: Family Medicine

## 2011-10-11 DIAGNOSIS — Z1231 Encounter for screening mammogram for malignant neoplasm of breast: Secondary | ICD-10-CM | POA: Diagnosis not present

## 2011-11-02 DIAGNOSIS — L821 Other seborrheic keratosis: Secondary | ICD-10-CM | POA: Diagnosis not present

## 2011-11-02 DIAGNOSIS — D239 Other benign neoplasm of skin, unspecified: Secondary | ICD-10-CM | POA: Diagnosis not present

## 2011-11-02 DIAGNOSIS — Z85820 Personal history of malignant melanoma of skin: Secondary | ICD-10-CM | POA: Diagnosis not present

## 2011-11-02 DIAGNOSIS — Z85828 Personal history of other malignant neoplasm of skin: Secondary | ICD-10-CM | POA: Diagnosis not present

## 2011-12-28 DIAGNOSIS — H04129 Dry eye syndrome of unspecified lacrimal gland: Secondary | ICD-10-CM | POA: Diagnosis not present

## 2011-12-28 DIAGNOSIS — H5200 Hypermetropia, unspecified eye: Secondary | ICD-10-CM | POA: Diagnosis not present

## 2011-12-28 DIAGNOSIS — H2510 Age-related nuclear cataract, unspecified eye: Secondary | ICD-10-CM | POA: Diagnosis not present

## 2011-12-28 DIAGNOSIS — H40249 Residual stage of angle-closure glaucoma, unspecified eye: Secondary | ICD-10-CM | POA: Diagnosis not present

## 2012-01-26 ENCOUNTER — Ambulatory Visit (INDEPENDENT_AMBULATORY_CARE_PROVIDER_SITE_OTHER): Payer: Medicare Other | Admitting: Internal Medicine

## 2012-01-26 ENCOUNTER — Encounter: Payer: Self-pay | Admitting: Internal Medicine

## 2012-01-26 VITALS — BP 128/80 | Temp 98.3°F | Wt 158.0 lb

## 2012-01-26 DIAGNOSIS — Z8719 Personal history of other diseases of the digestive system: Secondary | ICD-10-CM

## 2012-01-26 DIAGNOSIS — R109 Unspecified abdominal pain: Secondary | ICD-10-CM

## 2012-01-26 LAB — CBC WITH DIFFERENTIAL/PLATELET
Basophils Absolute: 0 10*3/uL (ref 0.0–0.1)
Eosinophils Absolute: 0.1 10*3/uL (ref 0.0–0.7)
HCT: 41.6 % (ref 36.0–46.0)
Lymphs Abs: 2.1 10*3/uL (ref 0.7–4.0)
MCHC: 33.4 g/dL (ref 30.0–36.0)
MCV: 90.6 fl (ref 78.0–100.0)
Monocytes Absolute: 0.4 10*3/uL (ref 0.1–1.0)
Neutro Abs: 3.1 10*3/uL (ref 1.4–7.7)
Platelets: 268 10*3/uL (ref 150.0–400.0)
RDW: 13.4 % (ref 11.5–14.6)

## 2012-01-26 MED ORDER — HYDROCODONE-ACETAMINOPHEN 5-500 MG PO TABS: 1.0000 | ORAL_TABLET | ORAL | Status: DC | PRN

## 2012-01-26 MED ORDER — HYOSCYAMINE SULFATE 0.125 MG SL SUBL: 0.1250 mg | SUBLINGUAL_TABLET | SUBLINGUAL | Status: DC | PRN

## 2012-01-26 NOTE — Patient Instructions (Signed)
Drink as much fluid as you  can tolerate over the next few days and advance diet as tolerated Medications as directed  Report to the emergency department if  You  develops fever worsening pain vomiting or abdominal distention

## 2012-01-26 NOTE — Progress Notes (Signed)
Subjective:    Patient ID: Rebecca [name], female    DOB: May 16, 1946, 66 y.o.   MRN: [id number]  HPI  66 year old patient who has a long history of IBS. She had an episode 2 weeks ago that resolved and was stable until approximately 3 AM today when she developed right lower quadrant pain. Right lower quadrant pain it is not unusual for her symptomatic IBS. She has been evaluated for acute appendicitis due to similar presentation in the past there's been no fever or significant nausea and vomiting. She does feel the pain is quite similar to IBS although more severe and not associated with her usual diarrhea there's been only very mild nausea but no vomiting. No fever  Past Medical History  Diagnosis Date  . GERD (gastroesophageal reflux disease)   . MI (myocardial infarction)   . CAD (coronary artery disease)   . Chest pain, atypical   . Hyperlipidemia   . Hypertension   . Fatigue   . Hypothyroidism   . Fatigue   . Edema   . Asthma     extrinsic  . Osteoarthritis   . Keratosis, seborrheic   . Labyrinthitis   . Skin cancer   . IBS (irritable bowel syndrome)     hx    History   Social History  . Marital Status: Married    Spouse Name: N/A    Number of Children: N/A  . Years of Education: N/A   Occupational History  . Not on file.   Social History Main Topics  . Smoking status: Never Smoker   . Smokeless tobacco: Not on file  . Alcohol Use: Not on file  . Drug Use: Not on file  . Sexually Active: Not on file   Other Topics Concern  . Not on file   Social History Narrative  . No narrative on file    Past Surgical History  Procedure Date  . Cholecystectomy   . Tubal ligation   . Knee arthroplasty     total right  02-28-2010 Dr Ollen Gross    Family History  Problem Relation Age of Onset  . Melanoma    . Heart disease    . Breast cancer      Allergies  Allergen Reactions  . Sulfonamide Derivatives     REACTION: Levonne Spiller syndrome    Current  Outpatient Prescriptions on File Prior to Visit  Medication Sig Dispense Refill  . acetaminophen (TYLENOL) 325 MG tablet Take 650 mg by mouth every 6 (six) hours as needed.        Marland Kitchen albuterol (PROVENTIL HFA) 108 (90 BASE) MCG/ACT inhaler Inhale 2 puffs into the lungs every 6 (six) hours as needed for wheezing.  18 g  3  . albuterol (PROVENTIL) (2.5 MG/3ML) 0.083% nebulizer solution Take 2.5 mg by nebulization 4 (four) times daily.  75 mL  12  . ALPRAZolam (XANAX) 0.5 MG tablet Take 1 tablet (0.5 mg total) by mouth at bedtime as needed.  30 tablet  3  . aspirin 325 MG tablet Take 325 mg by mouth daily.        Marland Kitchen atorvastatin (LIPITOR) 80 MG tablet Take 1 tablet (80 mg total) by mouth daily.  100 tablet  3  . beclomethasone (QVAR) 80 MCG/ACT inhaler Inhale 2 puffs into the lungs 2 (two) times daily.        . calcium carbonate (OS-CAL) 600 MG TABS Take 600 mg by mouth 2 (two) times daily with a meal.        .  cetirizine (ZYRTEC) 10 MG tablet Take 10 mg by mouth daily.        . Cholecalciferol (VITAMIN D) 2000 UNITS CAPS Take 1 capsule by mouth daily.        Marland Kitchen conjugated estrogens (PREMARIN) vaginal cream Place 1 g vaginally every 7 (seven) days. weekly       . diltiazem (CARDIZEM CD) 180 MG 24 hr capsule Take 1 capsule (180 mg total) by mouth daily.  100 capsule  3  . furosemide (LASIX) 20 MG tablet Take 1 tablet (20 mg total) by mouth daily.  100 tablet  3  . levothyroxine (SYNTHROID, LEVOTHROID) 50 MCG tablet Take 1 tablet (50 mcg total) by mouth daily.  100 tablet  3  . mometasone (NASONEX) 50 MCG/ACT nasal spray 2 sprays by Nasal route as needed.        . montelukast (SINGULAIR) 10 MG tablet Take 1 tablet (10 mg total) by mouth daily.  100 tablet  3  . Naphazoline-Polyethyl Glycol (EYE DROPS) 0.012-0.2 % SOLN Apply to eye. Artifical eye drops       . nitroGLYCERIN (NITROSTAT) 0.4 MG SL tablet Place 1 tablet (0.4 mg total) under the tongue every 5 (five) minutes as needed.  25 tablet  1  . Omega-3  Fatty Acids (FISH OIL) 1200 MG CAPS Take by mouth.        Marland Kitchen omeprazole (PRILOSEC) 20 MG capsule Take 1 capsule (20 mg total) by mouth 2 (two) times daily.  200 capsule  3  . Polyethyl Glycol-Propyl Glycol (SYSTANE) 0.4-0.3 % SOLN Apply to eye.        . potassium chloride SA (K-DUR,KLOR-CON) 20 MEQ tablet Take 1 tablet (20 mEq total) by mouth daily.  90 tablet  3  . Salicylic Acid-Cleanser 6 % (CREAM) KIT Apply topically.        Marland Kitchen zolpidem (AMBIEN) 10 MG tablet Take 1 tablet (10 mg total) by mouth at bedtime as needed.  30 tablet  3  . DISCONTD: beclomethasone (QVAR) 80 MCG/ACT inhaler Inhale 1 puff into the lungs as needed.  3 Inhaler  4    BP 128/80  Temp 98.3 F (36.8 C) (Oral)  Wt 158 lb (71.668 kg)       Review of Systems  Constitutional: Negative.  Negative for fever.  HENT: Negative for hearing loss, congestion, sore throat, rhinorrhea, dental problem, sinus pressure and tinnitus.   Eyes: Negative for pain, discharge and visual disturbance.  Respiratory: Negative for cough and shortness of breath.   Cardiovascular: Negative for chest pain, palpitations and leg swelling.  Gastrointestinal: Positive for nausea and abdominal pain. Negative for vomiting, diarrhea, constipation, blood in stool and abdominal distention.  Genitourinary: Negative for dysuria, urgency, frequency, hematuria, flank pain, vaginal bleeding, vaginal discharge, difficulty urinating, vaginal pain and pelvic pain.  Musculoskeletal: Negative for joint swelling, arthralgias and gait problem.  Skin: Negative for rash.  Neurological: Negative for dizziness, syncope, speech difficulty, weakness, numbness and headaches.  Hematological: Negative for adenopathy.  Psychiatric/Behavioral: Negative for behavioral problems, dysphoric mood and agitation. The patient is not nervous/anxious.        Objective:   Physical Exam  Constitutional: She is oriented to person, place, and time. She appears well-developed and  well-nourished. No distress.  HENT:  Head: Normocephalic.  Right Ear: External ear normal.  Left Ear: External ear normal.  Mouth/Throat: Oropharynx is clear and moist.  Eyes: Conjunctivae and EOM are normal. Pupils are equal, round, and reactive to light.  Neck: Normal  range of motion. Neck supple. No thyromegaly present.  Cardiovascular: Normal rate, regular rhythm, normal heart sounds and intact distal pulses.   Pulmonary/Chest: Effort normal and breath sounds normal.  Abdominal: Soft. Bowel sounds are normal. She exhibits no distension and no mass. There is tenderness. There is no rebound and no guarding.       Bowel sounds are active Tenderness to deep palpation in the right lower quadrant but no rebound tenderness or guarding  Musculoskeletal: Normal range of motion.  Lymphadenopathy:    She has no cervical adenopathy.  Neurological: She is alert and oriented to person, place, and time.  Skin: Skin is warm and dry. No rash noted.  Psychiatric: She has a normal mood and affect. Her behavior is normal.          Assessment & Plan:   History of IBS Right lower quadrant pain. Exam is fairly benign. Will treat symptomatically with Less than and Vicodin and observe. We'll check a CBC. The patient is a former [employer]  and is aware to report to the ED if there is any fever or clinical worsening

## 2012-02-15 ENCOUNTER — Telehealth: Payer: Self-pay | Admitting: Family Medicine

## 2012-02-15 MED ORDER — HYDROCODONE-HOMATROPINE 5-1.5 MG/5ML PO SYRP: 5.0000 mL | ORAL_SOLUTION | Freq: Three times a day (TID) | ORAL | Status: AC | PRN

## 2012-02-15 NOTE — Telephone Encounter (Signed)
Hydromet per Dr Tawanna Cooler with office visit Monday if not better. Rx called in and an appointment made.  Patient is aware.

## 2012-02-15 NOTE — Telephone Encounter (Signed)
Patient called stating that she is having cough and congestion and can not seem to shake it and would like to come in for a visit to maybe get abx. Please assist.

## 2012-02-19 ENCOUNTER — Ambulatory Visit (INDEPENDENT_AMBULATORY_CARE_PROVIDER_SITE_OTHER)
Admission: RE | Admit: 2012-02-19 | Discharge: 2012-02-19 | Disposition: A | Discharge status: Home / Self Care | Payer: Medicare Other | Source: Ambulatory Visit | Attending: Family Medicine | Admitting: Family Medicine

## 2012-02-19 ENCOUNTER — Ambulatory Visit (INDEPENDENT_AMBULATORY_CARE_PROVIDER_SITE_OTHER): Payer: Medicare Other | Admitting: Family Medicine

## 2012-02-19 ENCOUNTER — Encounter: Payer: Self-pay | Admitting: Family Medicine

## 2012-02-19 VITALS — BP 140/80 | HR 84 | Temp 98.0°F | Wt 160.0 lb

## 2012-02-19 DIAGNOSIS — J45909 Unspecified asthma, uncomplicated: Secondary | ICD-10-CM

## 2012-02-19 DIAGNOSIS — R51 Headache: Secondary | ICD-10-CM

## 2012-02-19 DIAGNOSIS — J3489 Other specified disorders of nose and nasal sinuses: Secondary | ICD-10-CM | POA: Diagnosis not present

## 2012-02-19 MED ORDER — HYDROCODONE-HOMATROPINE 5-1.5 MG/5ML PO SYRP: 5.0000 mL | ORAL_SOLUTION | Freq: Three times a day (TID) | ORAL | Status: AC | PRN

## 2012-02-19 MED ORDER — PREDNISONE 20 MG PO TABS: ORAL_TABLET | ORAL | Status: DC

## 2012-02-19 NOTE — Progress Notes (Signed)
  Subjective:    Patient ID: Rebecca [name], female    DOB: 07/26/1945, 66 y.o.   MRN: [id number]  HPI [name] is a 66 year old married female nonsmoker who comes in today for evaluation of cough wheezing and facial pain  She states 13 days ago she developed a viral type syndrome with head congestion rhinitis and cough and it triggers her asthma. She immediately started her nebulizer 4 times daily and continued her Nasonex, Singulair, Qvar 80 dose 2 puffs twice a day. She's still coughing and can't sleep well at night. No fever no sputum production  For the past couple days she's had severe facial pain and wonders if she has a sinus infection.   Review of Systems    general and pulmonary and ENT review of systems otherwise negative Objective:   Physical Exam Well-developed well-nourished female no acute distress HEENT negative neck was supple no adenopathy lungs are clear except for bilateral mild to moderate expiratory wheezing       Assessment & Plan:  Viral syndrome with secondary asthma plan add prednisone  facial pain x-ray rule out sinusitis

## 2012-02-19 NOTE — Patient Instructions (Signed)
Take 2  prednisone tablets for 3 days or until you feel a lot better and then begin to taper as outlined  Hydromet 1/2-1 teaspoon 3 times daily as needed for cough  Drink lots of water  Rest at home  Go to the main office now for x-rays we will call you the report  While on the prednisone ,,,,,,,,,,,,,,, complete carbohydrate free diet

## 2012-02-27 ENCOUNTER — Ambulatory Visit: Payer: Medicare Other | Admitting: Internal Medicine

## 2012-03-06 ENCOUNTER — Ambulatory Visit (INDEPENDENT_AMBULATORY_CARE_PROVIDER_SITE_OTHER): Payer: Medicare Other | Admitting: Internal Medicine

## 2012-03-06 ENCOUNTER — Other Ambulatory Visit (INDEPENDENT_AMBULATORY_CARE_PROVIDER_SITE_OTHER): Payer: Medicare Other

## 2012-03-06 ENCOUNTER — Encounter: Payer: Self-pay | Admitting: Internal Medicine

## 2012-03-06 VITALS — BP 132/66 | HR 72 | Ht 58.5 in | Wt 151.4 lb

## 2012-03-06 DIAGNOSIS — K625 Hemorrhage of anus and rectum: Secondary | ICD-10-CM

## 2012-03-06 DIAGNOSIS — R1031 Right lower quadrant pain: Secondary | ICD-10-CM

## 2012-03-06 DIAGNOSIS — K219 Gastro-esophageal reflux disease without esophagitis: Secondary | ICD-10-CM

## 2012-03-06 DIAGNOSIS — K589 Irritable bowel syndrome without diarrhea: Secondary | ICD-10-CM

## 2012-03-06 LAB — BASIC METABOLIC PANEL
CO2: 31 mEq/L (ref 19–32)
Chloride: 100 mEq/L (ref 96–112)
Potassium: 3.9 mEq/L (ref 3.5–5.1)
Sodium: 139 mEq/L (ref 135–145)

## 2012-03-06 LAB — URINALYSIS
Bilirubin Urine: NEGATIVE
Leukocytes, UA: NEGATIVE
Nitrite: NEGATIVE
Specific Gravity, Urine: <=1.005 (ref 1.000–1.030)
Total Protein, Urine: NEGATIVE
pH: 6.5 (ref 5.0–8.0)

## 2012-03-06 NOTE — Progress Notes (Signed)
HISTORY OF PRESENT ILLNESS:  Rebecca [name] is a 65 y.o. female with coronary artery disease, hypertension, hyperlipidemia, hypothyroidism, asthma, osteoarthritis, GERD, and IBS. She was last seen 04/13/2010 4 diarrhea, nausea, and weight loss. See that dictation for details. She presents today with chief complaint of right lower quadrant pain of 2-3 months duration. She reports having the pain most days. About 2 months ago she had a very severe episode with radiation into the right leg and periumbilical region. She states the pain was so severe that she contemplated going to the emergency room. She subsequently saw her PCPs office who performed blood work which was negative. Levsin sublingual prescribed. This did not help. Review of outside records from July 2013 shows normal CBC with differential. Patient reports that the discomfort is most prominent when she sleeps on her side or bumps up against a countertop. The discomfort is not affected by meals or bowel movements. She states that her IBS is stable with a predominance toward diarrhea. Actually a little better this year. She has had some intermittent minor rectal bleeding which she is to get hemorrhoids. She has self treated with over-the-counter agents. No bleeding in the past week. In terms of a right-sided pain, she states that she has had problems like this in the past for which she underwent exploratory laparoscopy which was negative. As well, and emergency room evaluation about 5 years ago to rule out appendicitis. Evaluation was unrevealing. In addition to the exploratory laparoscopy, the patient has had prior cholecystectomy. She has had no nausea, vomiting, or unexplained change in weight. She denies urinary symptoms such as dysuria or hematuria. She has had prior endoscopy and colonoscopy in February 2011. Upper endoscopy to evaluate chest pain was normal except for a few tiny antral erosions. Testing for Helicobacter pylori was negative. Complete  colonoscopy was normal except for mild sigmoid diverticulosis. Followup in 10 years recommended. She also has GERD for which she takes omeprazole. Currently with no active GERD symptoms on medication.  REVIEW OF SYSTEMS:  All non-GI ROS negative except for sinus and allergy trouble, anxiety, arthritis, cough, depression, insomnia, ankle edema, voice change periodically.  Past Medical History  Diagnosis Date  . GERD (gastroesophageal reflux disease)   . MI (myocardial infarction)   . CAD (coronary artery disease)   . Chest pain, atypical   . Hyperlipidemia   . Hypertension   . Fatigue   . Hypothyroidism   . Fatigue   . Edema   . Asthma     extrinsic  . Osteoarthritis   . Keratosis, seborrheic   . Labyrinthitis   . Skin cancer   . IBS (irritable bowel syndrome)     hx    Past Surgical History  Procedure Date  . Cholecystectomy   . Tubal ligation   . Knee arthroplasty     total right  02-28-2010 Dr Frank Aluisio, left    Social History Rebecca [name]  reports that she has never smoked. She has never used smokeless tobacco. She reports that she drinks alcohol. She reports that she does not use illicit drugs.  family history includes Breast cancer in her sister; Heart disease in her mother; Hypertension in her father, mother, and sister; and Melanoma in her daughter.  Allergies  Allergen Reactions  . Sulfonamide Derivatives     REACTION: Stevens Johnson syndrome       PHYSICAL EXAMINATION: Vital signs: BP 132/66  Pulse 72  Ht 4' 10.5" (1.486 m)  Wt 151 lb   6 oz (68.663 kg)  BMI 31.10 kg/m2  Constitutional: generally well-appearing, no acute distress Psychiatric: alert and oriented x3, cooperative. Mildly anxious Eyes: extraocular movements intact, anicteric, conjunctiva pink Mouth: oral pharynx moist, no lesions Neck: supple no lymphadenopathy Cardiovascular: heart regular rate and rhythm, no murmur Lungs: clear to auscultation bilaterally Abdomen: soft,obese,  minimal tenderness with deep palpation in the right lower quarter without rebound or mass, nondistended, no obvious ascites, no peritoneal signs, normal bowel sounds, no organomegaly. No obvious inguinal or femoral hernia Rectal:no external abnormalities. No internal mass. No tenderness. Hemoccult negative stool Extremities: no lower extremity edema bilaterally. Rotation of both hips without limitation or reproduction of pain Skin: no lesions on visible extremities Neuro: No focal deficits. No asterixis.     ASSESSMENT:  #1. 2-3 month history of right lower quadrant pain. Severe at times as described. Rule out occult lesion or hernia #2. Minor rectal bleeding. Resolved. Negative rectal exam. Possibly small internal hemorrhoid fissure. Resolved. Colonoscopy 2 years ago negative #3. IBS. Diarrhea predominant. Stable #4. GERD. Doing well on PPI #5. Health-related anxiety #6. General medical problems care for by Dr. Todd   PLAN:  #1. Urinalysis #2. Contrast-enhanced CT scan of the abdomen and pelvis #3. Continue IBS regimen #4. Continue PPI reflux precautions #5. If workup negative, reassurance and followup as needed. I discussed this with her. She is planning a trip to [place] the end of next week. We will expedite her workup prior to her trip. #6. Surveillance colonoscopy around 2021 

## 2012-03-06 NOTE — Patient Instructions (Addendum)
Your physician has requested that you go to the basement for the following lab work before leaving today:  BMET, UA   You have been scheduled for a CT scan of the abdomen and pelvis at [facility] ([address]---this is in the same building as Architectural technologist).   You are scheduled on 03-08-12 at 8:30. You should arrive 15 minutes prior to your appointment time for registration. Please follow the written instructions below on the day of your exam:  WARNING: IF YOU ARE ALLERGIC TO IODINE/X-RAY DYE, PLEASE NOTIFY RADIOLOGY IMMEDIATELY AT [phone]! YOU WILL BE GIVEN A 13 HOUR PREMEDICATION PREP.  1) Do not eat or drink anything after 4:30am (4 hours prior to your test) 2) You have been given 2 bottles of oral contrast to drink. The solution may taste better if refrigerated, but do NOT add ice or any other liquid to this solution. Shake well before drinking.    Drink 1 bottle of contrast @6 :30am (2 hours prior to your exam)  Drink 1 bottle of contrast @ 7:30am (1 hour prior to your exam)  You may take any medications as prescribed with a small amount of water except for the following: Metformin, Glucophage, Glucovance, Avandamet, Riomet, Fortamet, Actoplus Met, Janumet, Glumetza or Metaglip. The above medications must be held the day of the exam AND 48 hours after the exam.  The purpose of you drinking the oral contrast is to aid in the visualization of your intestinal tract. The contrast solution may cause some diarrhea. Before your exam is started, you will be given a small amount of fluid to drink. Depending on your individual set of symptoms, you may also receive an intravenous injection of x-ray contrast/dye. Plan on being at [facility] for 30 minutes or long, depending on the type of exam you are having performed.  If you have any questions regarding your exam or if you need to reschedule, you may call the CT department at [phone] between the hours of 8:00 am  and 5:00 pm, Monday-Friday.  ________________________________________________________________________

## 2012-03-08 ENCOUNTER — Ambulatory Visit (INDEPENDENT_AMBULATORY_CARE_PROVIDER_SITE_OTHER)
Admission: RE | Admit: 2012-03-08 | Discharge: 2012-03-08 | Disposition: A | Discharge status: Home / Self Care | Payer: Medicare Other | Source: Ambulatory Visit | Attending: Internal Medicine | Admitting: Internal Medicine

## 2012-03-08 DIAGNOSIS — K625 Hemorrhage of anus and rectum: Secondary | ICD-10-CM

## 2012-03-08 DIAGNOSIS — K589 Irritable bowel syndrome without diarrhea: Secondary | ICD-10-CM | POA: Diagnosis not present

## 2012-03-08 DIAGNOSIS — R1031 Right lower quadrant pain: Secondary | ICD-10-CM

## 2012-03-08 DIAGNOSIS — R109 Unspecified abdominal pain: Secondary | ICD-10-CM | POA: Diagnosis not present

## 2012-03-08 MED ORDER — IOHEXOL 300 MG/ML  SOLN
100.0000 mL | Freq: Once | INTRAMUSCULAR | Status: AC | PRN
  Administered 2012-03-08: 100 mL via INTRAVENOUS

## 2012-03-13 ENCOUNTER — Other Ambulatory Visit: Payer: Self-pay

## 2012-03-13 ENCOUNTER — Telehealth: Payer: Self-pay

## 2012-03-13 DIAGNOSIS — R932 Abnormal findings on diagnostic imaging of liver and biliary tract: Secondary | ICD-10-CM

## 2012-03-13 NOTE — Telephone Encounter (Signed)
Pt has been instructed and meds reviewed she will call with any questions or concerns 

## 2012-03-13 NOTE — Telephone Encounter (Signed)
Left message on machine to call back  

## 2012-03-13 NOTE — Telephone Encounter (Signed)
Left message with family member to have the pt call

## 2012-03-13 NOTE — Telephone Encounter (Signed)
John, I reviewed the CT scan images. Agree that EUS is a good next step to get better look at pancreas, CBD. Doubt underlying malignancy at this point.   Alka Falwell, She needs upper EUS, radial +/- linear, for abnormal pancreas on CT, next available EUS time with propofol sedation. Thanks dan

## 2012-04-01 ENCOUNTER — Ambulatory Visit (INDEPENDENT_AMBULATORY_CARE_PROVIDER_SITE_OTHER): Payer: Medicare Other | Admitting: Cardiology

## 2012-04-01 ENCOUNTER — Encounter: Payer: Self-pay | Admitting: Cardiology

## 2012-04-01 VITALS — BP 137/75 | HR 74 | Ht 60.0 in | Wt 159.8 lb

## 2012-04-01 DIAGNOSIS — I2510 Atherosclerotic heart disease of native coronary artery without angina pectoris: Secondary | ICD-10-CM | POA: Diagnosis not present

## 2012-04-01 NOTE — Progress Notes (Signed)
HPI Rebecca [name] comes in today for evaluation and management of her history of coronary disease, history of percutaneous coronary intervention, and history of MI.  She's having no angina or chest discomfort. She denies any palpitations. She has not had to use any nitroglycerin.  She's having endoscopy with ERCP this Thursday with Dr. Marina Goodell. She has a 8 mm pancreatic lesion. She's very concerned  about this.  Past Medical History  Diagnosis Date  . GERD (gastroesophageal reflux disease)   . MI (myocardial infarction)   . CAD (coronary artery disease)   . Chest pain, atypical   . Hyperlipidemia   . Hypertension   . Fatigue   . Hypothyroidism   . Fatigue   . Edema   . Asthma     extrinsic  . Osteoarthritis   . Keratosis, seborrheic   . Labyrinthitis   . Skin cancer   . IBS (irritable bowel syndrome)     hx    Current Outpatient Prescriptions  Medication Sig Dispense Refill  . acetaminophen (TYLENOL) 325 MG tablet Take 650 mg by mouth every 6 (six) hours as needed.        Marland Kitchen albuterol (PROVENTIL HFA) 108 (90 BASE) MCG/ACT inhaler Inhale 2 puffs into the lungs every 6 (six) hours as needed for wheezing.  18 g  3  . albuterol (PROVENTIL) (2.5 MG/3ML) 0.083% nebulizer solution Take 2.5 mg by nebulization 4 (four) times daily.  75 mL  12  . aspirin 325 MG tablet Take 325 mg by mouth daily.        Marland Kitchen atorvastatin (LIPITOR) 80 MG tablet Take 1 tablet (80 mg total) by mouth daily.  100 tablet  3  . beclomethasone (QVAR) 80 MCG/ACT inhaler Inhale 2 puffs into the lungs 2 (two) times daily.        . calcium carbonate (OS-CAL) 600 MG TABS Take 600 mg by mouth 2 (two) times daily with a meal.        . cetirizine (ZYRTEC) 10 MG tablet Take 10 mg by mouth daily.        . Cholecalciferol (VITAMIN D) 2000 UNITS CAPS Take 1 capsule by mouth daily.        Marland Kitchen conjugated estrogens (PREMARIN) vaginal cream Place 1 g vaginally every 7 (seven) days. weekly       . diltiazem (CARDIZEM CD) 180 MG 24 hr  capsule Take 1 capsule (180 mg total) by mouth daily.  100 capsule  3  . furosemide (LASIX) 20 MG tablet Take 20 mg by mouth daily. 20 mg to 40 mg as needed      . hyoscyamine (LEVSIN SL) 0.125 MG SL tablet Place 0.125 mg under the tongue every 4 (four) hours as needed.      Marland Kitchen levothyroxine (SYNTHROID, LEVOTHROID) 50 MCG tablet Take 1 tablet (50 mcg total) by mouth daily.  100 tablet  3  . Loperamide HCl (IMODIUM A-D PO) Take 1 tablet by mouth as needed.      . mometasone (NASONEX) 50 MCG/ACT nasal spray 2 sprays by Nasal route as needed.        . montelukast (SINGULAIR) 10 MG tablet Take 1 tablet (10 mg total) by mouth daily.  100 tablet  3  . Naphazoline-Polyethyl Glycol (EYE DROPS) 0.012-0.2 % SOLN Apply to eye. Artifical eye drops       . nitroGLYCERIN (NITROSTAT) 0.4 MG SL tablet Place 1 tablet (0.4 mg total) under the tongue every 5 (five) minutes as needed.  25 tablet  1  . Omega-3 Fatty Acids (FISH OIL) 1200 MG CAPS Take by mouth.        Marland Kitchen omeprazole (PRILOSEC) 20 MG capsule Take 1 capsule (20 mg total) by mouth 2 (two) times daily.  200 capsule  3  . potassium chloride SA (K-DUR,KLOR-CON) 20 MEQ tablet Take 1 tablet (20 mEq total) by mouth daily.  90 tablet  3  . Salicylic Acid-Cleanser 6 % (CREAM) KIT Apply topically.        Marland Kitchen DISCONTD: furosemide (LASIX) 20 MG tablet Take 1 tablet (20 mg total) by mouth daily.  100 tablet  3  . predniSONE (DELTASONE) 20 MG tablet 2 tabs x3 days, 1 tab x3 days, a half a tab x3 days, then a half a tablet Monday Wednesday Friday for a 2 week taper as needed      . DISCONTD: beclomethasone (QVAR) 80 MCG/ACT inhaler Inhale 1 puff into the lungs as needed.  3 Inhaler  4    Allergies  Allergen Reactions  . Sulfonamide Derivatives     REACTION: Levonne Spiller syndrome    Family History  Problem Relation Age of Onset  . Melanoma Daughter   . Heart disease Mother   . Breast cancer Sister   . Hypertension Father   . Hypertension Mother   .  Hypertension Sister     History   Social History  . Marital Status: Married    Spouse Name: N/A    Number of Children: 2  . Years of Education: N/A   Occupational History  . RN-retired    Social History Main Topics  . Smoking status: Never Smoker   . Smokeless tobacco: Never Used  . Alcohol Use: Yes     rarely  . Drug Use: No  . Sexually Active: Not on file   Other Topics Concern  . Not on file   Social History Narrative  . No narrative on file    ROS ALL NEGATIVE EXCEPT THOSE NOTED IN HPI  PE  General Appearance: well developed, well nourished in no acute distress, a HEENT: symmetrical face, PERRLA, good dentition  Neck: no JVD, thyromegaly, or adenopathy, trachea midline Chest: symmetric without deformity Cardiac: PMI non-displaced, RRR, normal S1, S2, no gallop or murmur Lung: clear to ausculation and percussion Vascular: all pulses full without bruits  Abdominal: nondistended, nontender, good bowel sounds, no HSM, no bruits Extremities: no cyanosis, clubbing or edema, no sign of DVT, no varicosities  Skin: normal color, no rashes Neuro: alert and oriented x 3, non-focal Pysch: normal affect  EKG  normal sinus rhythm, ST segment changes inferiorly, no acute changes BMET    Component Value Date/Time   NA 139 03/06/2012 1455   K 3.9 03/06/2012 1455   CL 100 03/06/2012 1455   CO2 31 03/06/2012 1455   GLUCOSE 120* 03/06/2012 1455   BUN 16 03/06/2012 1455   CREATININE 0.9 03/06/2012 1455   CALCIUM 10.3 03/06/2012 1455   GFRNONAA >60 03/10/2011 0250   GFRAA >60 03/10/2011 0250    Lipid Panel     Component Value Date/Time   CHOL 179 05/16/2011 0948   TRIG 144.0 05/16/2011 0948   HDL 62.30 05/16/2011 0948   CHOLHDL 3 05/16/2011 0948   VLDL 28.8 05/16/2011 0948   LDLCALC 88 05/16/2011 0948    CBC    Component Value Date/Time   WBC 5.7 01/26/2012 1418   RBC 4.59 01/26/2012 1418   HGB 13.9 01/26/2012 1418   HCT 41.6 01/26/2012 1418  PLT 268.0 01/26/2012 1418    MCV 90.6 01/26/2012 1418   MCH 30.1 03/10/2011 0250   MCHC 33.4 01/26/2012 1418   RDW 13.4 01/26/2012 1418   LYMPHSABS 2.1 01/26/2012 1418   MONOABS 0.4 01/26/2012 1418   EOSABS 0.1 01/26/2012 1418   BASOSABS 0.0 01/26/2012 1418

## 2012-04-01 NOTE — Assessment & Plan Note (Signed)
Stable. Continue secondary preventative therapy. No changes made. Return the office in one year.

## 2012-04-01 NOTE — Patient Instructions (Addendum)
Your physician recommends that you continue on your current medications as directed. Please refer to the Current Medication list given to you today.  Your physician wants you to follow-up in: 1 year. You will receive a reminder letter in the mail two months in advance. If you don't receive a letter, please call our office to schedule the follow-up appointment.  

## 2012-04-03 ENCOUNTER — Ambulatory Visit (INDEPENDENT_AMBULATORY_CARE_PROVIDER_SITE_OTHER): Payer: Medicare Other | Admitting: Licensed Clinical Social Worker

## 2012-04-03 ENCOUNTER — Encounter: Payer: Self-pay | Admitting: Internal Medicine

## 2012-04-03 ENCOUNTER — Ambulatory Visit (INDEPENDENT_AMBULATORY_CARE_PROVIDER_SITE_OTHER): Payer: Medicare Other | Admitting: Internal Medicine

## 2012-04-03 VITALS — BP 132/82 | Temp 97.7°F | Wt 157.0 lb

## 2012-04-03 DIAGNOSIS — F4323 Adjustment disorder with mixed anxiety and depressed mood: Secondary | ICD-10-CM | POA: Diagnosis not present

## 2012-04-03 DIAGNOSIS — L0293 Carbuncle, unspecified: Secondary | ICD-10-CM

## 2012-04-03 MED ORDER — DOXYCYCLINE HYCLATE 100 MG PO TABS: 100.0000 mg | ORAL_TABLET | Freq: Two times a day (BID) | ORAL | Status: DC

## 2012-04-03 MED ORDER — MUPIROCIN 2 % EX OINT: TOPICAL_OINTMENT | Freq: Three times a day (TID) | CUTANEOUS | Status: DC

## 2012-04-03 NOTE — Patient Instructions (Addendum)
Please call our office if your symptoms do not improve or gets worse.  

## 2012-04-03 NOTE — Progress Notes (Signed)
Subjective:    Patient ID: [name], female    DOB: 07-23-1945, 66 y.o.   MRN: [id number]  HPI  66 y/o female with hx of CAD and hypertension complains of poorly healing sores underneath right nares.  Her symptoms having been ongoing for 3 weeks.  She notes initial lesion had appearance of small pimple.  She applied warm compress and used OTC triple antibiotic ointment.  Area drained and seemed to improve but she her persistent small red areas that will not go away.  She denies burning sensation.  No previous hx of cold sores.  Patient reports taking prednisone taper last month for bronchitis.   Review of Systems Negative for fever or chills  Past Medical History  Diagnosis Date  . GERD (gastroesophageal reflux disease)   . MI (myocardial infarction)   . CAD (coronary artery disease)   . Chest pain, atypical   . Hyperlipidemia   . Hypertension   . Fatigue   . Hypothyroidism   . Fatigue   . Edema   . Asthma     extrinsic  . Osteoarthritis   . Keratosis, seborrheic   . Labyrinthitis   . Skin cancer   . IBS (irritable bowel syndrome)     hx    History   Social History  . Marital Status: Married    Spouse Name: N/A    Number of Children: 2  . Years of Education: N/A   Occupational History  . RN-retired    Social History Main Topics  . Smoking status: Never Smoker   . Smokeless tobacco: Never Used  . Alcohol Use: Yes     rarely  . Drug Use: No  . Sexually Active: Not on file   Other Topics Concern  . Not on file   Social History Narrative  . No narrative on file    Past Surgical History  Procedure Date  . Cholecystectomy   . Tubal ligation   . Knee arthroplasty     total right  02-28-2010 Dr Ollen Gross, left    Family History  Problem Relation Age of Onset  . Melanoma Daughter   . Heart disease Mother   . Breast cancer Sister   . Hypertension Father   . Hypertension Mother   . Hypertension Sister     Allergies  Allergen Reactions    . Sulfonamide Derivatives     REACTION: Levonne Spiller syndrome    Current Outpatient Prescriptions on File Prior to Visit  Medication Sig Dispense Refill  . acetaminophen (TYLENOL) 325 MG tablet Take 650 mg by mouth every 6 (six) hours as needed.        Marland Kitchen albuterol (PROVENTIL HFA) 108 (90 BASE) MCG/ACT inhaler Inhale 2 puffs into the lungs every 6 (six) hours as needed for wheezing.  18 g  3  . albuterol (PROVENTIL) (2.5 MG/3ML) 0.083% nebulizer solution Take 2.5 mg by nebulization 4 (four) times daily.  75 mL  12  . aspirin 325 MG tablet Take 325 mg by mouth daily.        Marland Kitchen atorvastatin (LIPITOR) 80 MG tablet Take 1 tablet (80 mg total) by mouth daily.  100 tablet  3  . beclomethasone (QVAR) 80 MCG/ACT inhaler Inhale 2 puffs into the lungs 2 (two) times daily.        . calcium carbonate (OS-CAL) 600 MG TABS Take 600 mg by mouth 2 (two) times daily with a meal.        . cetirizine (ZYRTEC)  10 MG tablet Take 10 mg by mouth daily.        . Cholecalciferol (VITAMIN D) 2000 UNITS CAPS Take 1 capsule by mouth daily.        Marland Kitchen conjugated estrogens (PREMARIN) vaginal cream Place 1 g vaginally every 7 (seven) days. weekly       . diltiazem (CARDIZEM CD) 180 MG 24 hr capsule Take 1 capsule (180 mg total) by mouth daily.  100 capsule  3  . furosemide (LASIX) 20 MG tablet Take 20 mg by mouth daily. 20 mg to 40 mg as needed      . hyoscyamine (LEVSIN SL) 0.125 MG SL tablet Place 0.125 mg under the tongue every 4 (four) hours as needed.      Marland Kitchen levothyroxine (SYNTHROID, LEVOTHROID) 50 MCG tablet Take 1 tablet (50 mcg total) by mouth daily.  100 tablet  3  . Loperamide HCl (IMODIUM A-D PO) Take 1 tablet by mouth as needed.      . mometasone (NASONEX) 50 MCG/ACT nasal spray 2 sprays by Nasal route as needed.        . montelukast (SINGULAIR) 10 MG tablet Take 1 tablet (10 mg total) by mouth daily.  100 tablet  3  . Naphazoline-Polyethyl Glycol (EYE DROPS) 0.012-0.2 % SOLN Apply to eye. Artifical eye drops        . nitroGLYCERIN (NITROSTAT) 0.4 MG SL tablet Place 1 tablet (0.4 mg total) under the tongue every 5 (five) minutes as needed.  25 tablet  1  . Omega-3 Fatty Acids (FISH OIL) 1200 MG CAPS Take by mouth.        Marland Kitchen omeprazole (PRILOSEC) 20 MG capsule Take 1 capsule (20 mg total) by mouth 2 (two) times daily.  200 capsule  3  . potassium chloride SA (K-DUR,KLOR-CON) 20 MEQ tablet Take 1 tablet (20 mEq total) by mouth daily.  90 tablet  3  . predniSONE (DELTASONE) 20 MG tablet 2 tabs x3 days, 1 tab x3 days, a half a tab x3 days, then a half a tablet Monday Wednesday Friday for a 2 week taper as needed      . Salicylic Acid-Cleanser 6 % (CREAM) KIT Apply topically.        Marland Kitchen DISCONTD: beclomethasone (QVAR) 80 MCG/ACT inhaler Inhale 1 puff into the lungs as needed.  3 Inhaler  4    BP 132/82  Temp 97.7 F (36.5 C) (Oral)  Wt 157 lb (71.215 kg)       Objective:   Physical Exam  Constitutional: She appears well-developed and well-nourished.  HENT:  Right Ear: External ear normal.  Left Ear: External ear normal.  Mouth/Throat: Oropharynx is clear and moist.  Eyes: Conjunctivae normal are normal.  Cardiovascular: Normal rate, regular rhythm and normal heart sounds.   Pulmonary/Chest: Effort normal and breath sounds normal. She has no wheezes.  Skin:       3-4 1 mm red spots underneath right nares.  Slightly tender.  No fluctuance or purulent drainage  Psychiatric: She has a normal mood and affect. Her behavior is normal.          Assessment & Plan:

## 2012-04-04 ENCOUNTER — Encounter (HOSPITAL_COMMUNITY)
Admission: RE | Disposition: A | Discharge status: Home / Self Care | Payer: Self-pay | Source: Ambulatory Visit | Attending: Gastroenterology

## 2012-04-04 ENCOUNTER — Encounter (HOSPITAL_COMMUNITY): Payer: Self-pay | Admitting: Anesthesiology

## 2012-04-04 ENCOUNTER — Ambulatory Visit (HOSPITAL_COMMUNITY)
Admission: RE | Admit: 2012-04-04 | Discharge: 2012-04-04 | Disposition: A | Discharge status: Home / Self Care | Payer: Medicare Other | Source: Ambulatory Visit | Attending: Gastroenterology | Admitting: Gastroenterology

## 2012-04-04 ENCOUNTER — Encounter (HOSPITAL_COMMUNITY): Payer: Self-pay | Admitting: Gastroenterology

## 2012-04-04 ENCOUNTER — Ambulatory Visit (HOSPITAL_COMMUNITY): Payer: Medicare Other | Admitting: Anesthesiology

## 2012-04-04 DIAGNOSIS — I252 Old myocardial infarction: Secondary | ICD-10-CM | POA: Insufficient documentation

## 2012-04-04 DIAGNOSIS — E785 Hyperlipidemia, unspecified: Secondary | ICD-10-CM | POA: Diagnosis not present

## 2012-04-04 DIAGNOSIS — K219 Gastro-esophageal reflux disease without esophagitis: Secondary | ICD-10-CM | POA: Diagnosis not present

## 2012-04-04 DIAGNOSIS — K589 Irritable bowel syndrome without diarrhea: Secondary | ICD-10-CM | POA: Insufficient documentation

## 2012-04-04 DIAGNOSIS — I10 Essential (primary) hypertension: Secondary | ICD-10-CM | POA: Insufficient documentation

## 2012-04-04 DIAGNOSIS — K625 Hemorrhage of anus and rectum: Secondary | ICD-10-CM | POA: Insufficient documentation

## 2012-04-04 DIAGNOSIS — R1031 Right lower quadrant pain: Secondary | ICD-10-CM | POA: Diagnosis not present

## 2012-04-04 DIAGNOSIS — R933 Abnormal findings on diagnostic imaging of other parts of digestive tract: Secondary | ICD-10-CM

## 2012-04-04 DIAGNOSIS — Z79899 Other long term (current) drug therapy: Secondary | ICD-10-CM | POA: Insufficient documentation

## 2012-04-04 DIAGNOSIS — L0293 Carbuncle, unspecified: Secondary | ICD-10-CM | POA: Insufficient documentation

## 2012-04-04 DIAGNOSIS — R932 Abnormal findings on diagnostic imaging of liver and biliary tract: Secondary | ICD-10-CM | POA: Diagnosis not present

## 2012-04-04 HISTORY — PX: EUS: SHX5427

## 2012-04-04 SURGERY — UPPER ENDOSCOPIC ULTRASOUND (EUS) LINEAR
Anesthesia: Monitor Anesthesia Care

## 2012-04-04 MED ORDER — LIDOCAINE HCL 1 % IJ SOLN
INTRAMUSCULAR | Status: DC | PRN
  Administered 2012-04-04: 100 mg via INTRADERMAL

## 2012-04-04 MED ORDER — LACTATED RINGERS IV SOLN
INTRAVENOUS | Status: DC
  Administered 2012-04-04: 14:00:00 via INTRAVENOUS

## 2012-04-04 MED ORDER — PROPOFOL 10 MG/ML IV EMUL
INTRAVENOUS | Status: DC | PRN
  Administered 2012-04-04: 100 ug/kg/min via INTRAVENOUS

## 2012-04-04 MED ORDER — FENTANYL CITRATE 0.05 MG/ML IJ SOLN
INTRAMUSCULAR | Status: DC | PRN
  Administered 2012-04-04: 100 ug via INTRAVENOUS

## 2012-04-04 MED ORDER — MIDAZOLAM HCL 5 MG/5ML IJ SOLN
INTRAMUSCULAR | Status: DC | PRN
  Administered 2012-04-04: 2 mg via INTRAVENOUS

## 2012-04-04 MED ORDER — BUTAMBEN-TETRACAINE-BENZOCAINE 2-2-14 % EX AERO
INHALATION_SPRAY | CUTANEOUS | Status: DC | PRN
  Administered 2012-04-04: 2 via TOPICAL

## 2012-04-04 MED ORDER — SODIUM CHLORIDE 0.9 % IV SOLN: INTRAVENOUS | Status: DC

## 2012-04-04 NOTE — H&P (View-Only) (Signed)
HISTORY OF PRESENT ILLNESS:  Rebecca [name] is a 66 y.o. female with coronary artery disease, hypertension, hyperlipidemia, hypothyroidism, asthma, osteoarthritis, GERD, and IBS. She was last seen 04/13/2010 4 diarrhea, nausea, and weight loss. See that dictation for details. She presents today with chief complaint of right lower quadrant pain of 2-3 months duration. She reports having the pain most days. About 2 months ago she had a very severe episode with radiation into the right leg and periumbilical region. She states the pain was so severe that she contemplated going to the emergency room. She subsequently saw her PCPs office who performed blood work which was negative. Levsin sublingual prescribed. This did not help. Review of outside records from July 2013 shows normal CBC with differential. Patient reports that the discomfort is most prominent when she sleeps on her side or bumps up against a countertop. The discomfort is not affected by meals or bowel movements. She states that her IBS is stable with a predominance toward diarrhea. Actually a little better this year. She has had some intermittent minor rectal bleeding which she is to get hemorrhoids. She has self treated with over-the-counter agents. No bleeding in the past week. In terms of a right-sided pain, she states that she has had problems like this in the past for which she underwent exploratory laparoscopy which was negative. As well, and emergency room evaluation about 5 years ago to rule out appendicitis. Evaluation was unrevealing. In addition to the exploratory laparoscopy, the patient has had prior cholecystectomy. She has had no nausea, vomiting, or unexplained change in weight. She denies urinary symptoms such as dysuria or hematuria. She has had prior endoscopy and colonoscopy in February 2011. Upper endoscopy to evaluate chest pain was normal except for a few tiny antral erosions. Testing for Helicobacter pylori was negative. Complete  colonoscopy was normal except for mild sigmoid diverticulosis. Followup in 10 years recommended. She also has GERD for which she takes omeprazole. Currently with no active GERD symptoms on medication.  REVIEW OF SYSTEMS:  All non-GI ROS negative except for sinus and allergy trouble, anxiety, arthritis, cough, depression, insomnia, ankle edema, voice change periodically.  Past Medical History  Diagnosis Date  . GERD (gastroesophageal reflux disease)   . MI (myocardial infarction)   . CAD (coronary artery disease)   . Chest pain, atypical   . Hyperlipidemia   . Hypertension   . Fatigue   . Hypothyroidism   . Fatigue   . Edema   . Asthma     extrinsic  . Osteoarthritis   . Keratosis, seborrheic   . Labyrinthitis   . Skin cancer   . IBS (irritable bowel syndrome)     hx    Past Surgical History  Procedure Date  . Cholecystectomy   . Tubal ligation   . Knee arthroplasty     total right  02-28-2010 Dr Ollen Gross, left    Social History Rebecca [name]  reports that she has never smoked. She has never used smokeless tobacco. She reports that she drinks alcohol. She reports that she does not use illicit drugs.  family history includes Breast cancer in her sister; Heart disease in her mother; Hypertension in her father, mother, and sister; and Melanoma in her daughter.  Allergies  Allergen Reactions  . Sulfonamide Derivatives     REACTION: Levonne Spiller syndrome       PHYSICAL EXAMINATION: Vital signs: BP 132/66  Pulse 72  Ht 4' 10.5" (1.486 m)  Wt 151 lb  6 oz (68.663 kg)  BMI 31.10 kg/m2  Constitutional: generally well-appearing, no acute distress Psychiatric: alert and oriented x3, cooperative. Mildly anxious Eyes: extraocular movements intact, anicteric, conjunctiva pink Mouth: oral pharynx moist, no lesions Neck: supple no lymphadenopathy Cardiovascular: heart regular rate and rhythm, no murmur Lungs: clear to auscultation bilaterally Abdomen: soft,obese,  minimal tenderness with deep palpation in the right lower quarter without rebound or mass, nondistended, no obvious ascites, no peritoneal signs, normal bowel sounds, no organomegaly. No obvious inguinal or femoral hernia Rectal:no external abnormalities. No internal mass. No tenderness. Hemoccult negative stool Extremities: no lower extremity edema bilaterally. Rotation of both hips without limitation or reproduction of pain Skin: no lesions on visible extremities Neuro: No focal deficits. No asterixis.     ASSESSMENT:  #1. 2-3 month history of right lower quadrant pain. Severe at times as described. Rule out occult lesion or hernia #2. Minor rectal bleeding. Resolved. Negative rectal exam. Possibly small internal hemorrhoid fissure. Resolved. Colonoscopy 2 years ago negative #3. IBS. Diarrhea predominant. Stable #4. GERD. Doing well on PPI #5. Health-related anxiety #6. General medical problems care for by Dr. Tawanna Cooler   PLAN:  #1. Urinalysis #2. Contrast-enhanced CT scan of the abdomen and pelvis #3. Continue IBS regimen #4. Continue PPI reflux precautions #5. If workup negative, reassurance and followup as needed. I discussed this with her. She is planning a trip to [place] the end of next week. We will expedite her workup prior to her trip. #6. Surveillance colonoscopy around 2021

## 2012-04-04 NOTE — Assessment & Plan Note (Addendum)
Patient has small red areas around right nares that are refractory to OTC triple antibiotic ointment.  Treatment with doxycycline 100 mg twice daily.  We discussed possibility of MRSA.  She is allergic to Bactrim.  Also use bactroban ointment.   If no improvement, consider viral etiology with trial of zovirax ointment.

## 2012-04-04 NOTE — Interval H&P Note (Signed)
History and Physical Interval Note:  04/04/2012 1:37 PM  Rebecca [name]  has presented today for surgery, with the diagnosis of Abnormal CT scan, pancreas or bile duct [793.3]  The various methods of treatment have been discussed with the patient and family. After consideration of risks, benefits and other options for treatment, the patient has consented to  Procedure(s) (LRB) with comments: UPPER ENDOSCOPIC ULTRASOUND (EUS) LINEAR (N/A) - radial linear as a surgical intervention .  The patient's history has been reviewed, patient examined, no change in status, stable for surgery.  I have reviewed the patient's chart and labs.  Questions were answered to the patient's satisfaction.     Rob Bunting

## 2012-04-04 NOTE — Anesthesia Preprocedure Evaluation (Signed)
Anesthesia Evaluation  Patient identified by MRN, date of birth, ID band Patient awake    Reviewed: Allergy & Precautions, H&P , NPO status , Patient's Chart, lab work & pertinent test results  Airway Mallampati: II TM Distance: >3 FB Neck ROM: Full    Dental  (+) Dental Advisory Given Veneers. :   Pulmonary asthma ,  breath sounds clear to auscultation  Pulmonary exam normal       Cardiovascular hypertension, Pt. on medications + CAD, + Past MI and + Cardiac Stents Rhythm:Regular Rate:Normal  RCA Stent 5 years ago. Atypical chest pain one year ago   Neuro/Psych PSYCHIATRIC DISORDERS Depression negative neurological ROS     GI/Hepatic Neg liver ROS, GERD-  Medicated,  Endo/Other  Hypothyroidism   Renal/GU negative Renal ROS  negative genitourinary   Musculoskeletal negative musculoskeletal ROS (+)   Abdominal   Peds negative pediatric ROS (+)  Hematology negative hematology ROS (+)   Anesthesia Other Findings   Reproductive/Obstetrics negative OB ROS                           Anesthesia Physical Anesthesia Plan  ASA: III  Anesthesia Plan: MAC   Post-op Pain Management:    Induction: Intravenous  Airway Management Planned:   Additional Equipment:   Intra-op Plan:   Post-operative Plan: Extubation in OR  Informed Consent: I have reviewed the patients History and Physical, chart, labs and discussed the procedure including the risks, benefits and alternatives for the proposed anesthesia with the patient or authorized representative who has indicated his/her understanding and acceptance.   Dental advisory given  Plan Discussed with: CRNA  Anesthesia Plan Comments:         Anesthesia Quick Evaluation

## 2012-04-04 NOTE — Anesthesia Postprocedure Evaluation (Signed)
  Anesthesia Post-op Note  Patient: Rebecca [name]  Procedure(s) Performed: Procedure(s) (LRB): UPPER ENDOSCOPIC ULTRASOUND (EUS) LINEAR (N/A)  Patient Location: PACU  Anesthesia Type: MAC  Level of Consciousness: awake and alert   Airway and Oxygen Therapy: Patient Spontanous Breathing  Post-op Pain: mild  Post-op Assessment: Post-op Vital signs reviewed, Patient's Cardiovascular Status Stable, Respiratory Function Stable, Patent Airway and No signs of Nausea or vomiting  Post-op Vital Signs: stable  Complications: No apparent anesthesia complications

## 2012-04-04 NOTE — Op Note (Signed)
[facility] [address] [place], [address]   ENDOSCOPIC ULTRASOUND PROCEDURE REPORT  PATIENT: Rebecca [name], Rebecca [name]  MR#: [id number] BIRTHDATE: 1945-08-16  GENDER: Female ENDOSCOPIST: Rachael Fee, MD REFERRED BY:  Roxy Cedar, M.D. PROCEDURE DATE:  04/04/2012 PROCEDURE:   Upper EUS ASA CLASS:      Class III INDICATIONS:   recent right lower quadrant pain, CT scan suggested 8mm hypodensity in pancreatic head/uncinate. MEDICATIONS: MAC sedation, administered by CRNA  DESCRIPTION OF PROCEDURE:   After the risks benefits and alternatives of the procedure were  explained, informed consent was obtained. The patient was then placed in the left, lateral, decubitus postion and IV sedation was administered. Throughout the procedure, the patients blood pressure, pulse and oxygen saturations were monitored continuously.  Under direct visualization, the 110036  endoscope was introduced through the mouth  and advanced to the second portion of the duodenum .  Water was used as necessary to provide an acoustic interface.  Upon completion of the imaging, water was removed and the patient was sent to the recovery room in satisfactory condition.    Endoscopic findings (limited views with radial echoendoscope): 1. Normal UGI tract  EUS findings: 1. Pancreatic parenchyma in uncinate, head, neck, body and tail was normal.  No discrete lesions and no signs of chronic pancreatitis. 2. Main pancreatic duct was normal; non-dilated 3. No peripancreatic adenopathy 4. CBD was slightly dilated (up to 9mm) without filling defects and it tapered smoothly into head of pancreas. 5. Gallbladder surgically absent 6. Limited views of liver, spleen, portal and splenic vessels were all normal  Impression: The finding described on CT was not evident on todays examination. I suspect the abnormality related to a cleft within the pancreatic parenchyma and nearby soft tissues.   She has  no signs or symptoms to suggest pancreatic disease and does not require further dedicated pancreatic imaging.   _______________________________ eSigned:  Rachael Fee, MD 04/04/2012 2:41 PM

## 2012-04-04 NOTE — Transfer of Care (Signed)
Immediate Anesthesia Transfer of Care Note  Patient: Rebecca [name]  Procedure(s) Performed: Procedure(s) (LRB) with comments: UPPER ENDOSCOPIC ULTRASOUND (EUS) LINEAR (N/A) - radial linear  Patient Location: PACU and Endoscopy Unit  Anesthesia Type: MAC  Level of Consciousness: awake, alert , oriented and patient cooperative  Airway & Oxygen Therapy: Patient Spontanous Breathing and Patient connected to face mask oxygen  Post-op Assessment: Report given to PACU RN and Post -op Vital signs reviewed and stable  Post vital signs: Reviewed and stable  Complications: No apparent anesthesia complications

## 2012-04-05 ENCOUNTER — Encounter (HOSPITAL_COMMUNITY): Payer: Self-pay | Admitting: Gastroenterology

## 2012-04-12 ENCOUNTER — Ambulatory Visit (INDEPENDENT_AMBULATORY_CARE_PROVIDER_SITE_OTHER): Payer: Medicare Other | Admitting: Licensed Clinical Social Worker

## 2012-04-12 ENCOUNTER — Ambulatory Visit (INDEPENDENT_AMBULATORY_CARE_PROVIDER_SITE_OTHER): Payer: Medicare Other | Admitting: *Deleted

## 2012-04-12 DIAGNOSIS — Z23 Encounter for immunization: Secondary | ICD-10-CM | POA: Diagnosis not present

## 2012-04-12 DIAGNOSIS — F4323 Adjustment disorder with mixed anxiety and depressed mood: Secondary | ICD-10-CM

## 2012-04-30 ENCOUNTER — Ambulatory Visit (INDEPENDENT_AMBULATORY_CARE_PROVIDER_SITE_OTHER): Payer: Medicare Other | Admitting: Licensed Clinical Social Worker

## 2012-04-30 DIAGNOSIS — F4323 Adjustment disorder with mixed anxiety and depressed mood: Secondary | ICD-10-CM

## 2012-05-09 ENCOUNTER — Other Ambulatory Visit (INDEPENDENT_AMBULATORY_CARE_PROVIDER_SITE_OTHER): Payer: Medicare Other

## 2012-05-09 DIAGNOSIS — I10 Essential (primary) hypertension: Secondary | ICD-10-CM | POA: Diagnosis not present

## 2012-05-09 DIAGNOSIS — E785 Hyperlipidemia, unspecified: Secondary | ICD-10-CM | POA: Diagnosis not present

## 2012-05-09 DIAGNOSIS — Z0000 Encounter for general adult medical examination without abnormal findings: Secondary | ICD-10-CM

## 2012-05-09 LAB — POCT URINALYSIS DIPSTICK
Bilirubin, UA: NEGATIVE
Leukocytes, UA: NEGATIVE
Nitrite, UA: NEGATIVE
Protein, UA: NEGATIVE
Urobilinogen, UA: 0.2
pH, UA: 5.5

## 2012-05-09 LAB — HEPATIC FUNCTION PANEL: Albumin: 3.9 g/dL (ref 3.5–5.2)

## 2012-05-09 LAB — BASIC METABOLIC PANEL
CO2: 25 mEq/L (ref 19–32)
Calcium: 10 mg/dL (ref 8.4–10.5)
Chloride: 108 mEq/L (ref 96–112)
Potassium: 4.5 mEq/L (ref 3.5–5.1)
Sodium: 141 mEq/L (ref 135–145)

## 2012-05-09 LAB — CBC WITH DIFFERENTIAL/PLATELET
Basophils Relative: 0.3 % (ref 0.0–3.0)
Eosinophils Absolute: 0.1 10*3/uL (ref 0.0–0.7)
Hemoglobin: 14 g/dL (ref 12.0–15.0)
Lymphocytes Relative: 37.7 % (ref 12.0–46.0)
MCHC: 33.4 g/dL (ref 30.0–36.0)
Neutro Abs: 3 10*3/uL (ref 1.4–7.7)
RBC: 4.63 Mil/uL (ref 3.87–5.11)

## 2012-05-09 LAB — LIPID PANEL
HDL: 50.9 mg/dL (ref >39.00)
Total CHOL/HDL Ratio: 3
VLDL: 14.8 mg/dL (ref 0.0–40.0)

## 2012-05-09 LAB — TSH: TSH: 1.42 u[IU]/mL (ref 0.35–5.50)

## 2012-05-16 ENCOUNTER — Encounter: Payer: Self-pay | Admitting: Family Medicine

## 2012-05-16 ENCOUNTER — Ambulatory Visit (INDEPENDENT_AMBULATORY_CARE_PROVIDER_SITE_OTHER): Payer: Medicare Other | Admitting: Family Medicine

## 2012-05-16 ENCOUNTER — Ambulatory Visit (INDEPENDENT_AMBULATORY_CARE_PROVIDER_SITE_OTHER): Payer: Medicare Other | Admitting: Licensed Clinical Social Worker

## 2012-05-16 VITALS — BP 110/70 | Temp 97.8°F | Ht 58.5 in | Wt 155.0 lb

## 2012-05-16 DIAGNOSIS — F329 Major depressive disorder, single episode, unspecified: Secondary | ICD-10-CM

## 2012-05-16 DIAGNOSIS — R5381 Other malaise: Secondary | ICD-10-CM

## 2012-05-16 DIAGNOSIS — E039 Hypothyroidism, unspecified: Secondary | ICD-10-CM

## 2012-05-16 DIAGNOSIS — F5101 Primary insomnia: Secondary | ICD-10-CM

## 2012-05-16 DIAGNOSIS — R5383 Other fatigue: Secondary | ICD-10-CM

## 2012-05-16 DIAGNOSIS — F4323 Adjustment disorder with mixed anxiety and depressed mood: Secondary | ICD-10-CM

## 2012-05-16 DIAGNOSIS — Z0000 Encounter for general adult medical examination without abnormal findings: Secondary | ICD-10-CM | POA: Diagnosis not present

## 2012-05-16 DIAGNOSIS — I10 Essential (primary) hypertension: Secondary | ICD-10-CM

## 2012-05-16 DIAGNOSIS — F3289 Other specified depressive episodes: Secondary | ICD-10-CM

## 2012-05-16 DIAGNOSIS — I2510 Atherosclerotic heart disease of native coronary artery without angina pectoris: Secondary | ICD-10-CM

## 2012-05-16 DIAGNOSIS — J45909 Unspecified asthma, uncomplicated: Secondary | ICD-10-CM

## 2012-05-16 DIAGNOSIS — K589 Irritable bowel syndrome without diarrhea: Secondary | ICD-10-CM

## 2012-05-16 DIAGNOSIS — N760 Acute vaginitis: Secondary | ICD-10-CM

## 2012-05-16 DIAGNOSIS — E785 Hyperlipidemia, unspecified: Secondary | ICD-10-CM

## 2012-05-16 DIAGNOSIS — K219 Gastro-esophageal reflux disease without esophagitis: Secondary | ICD-10-CM

## 2012-05-16 DIAGNOSIS — R609 Edema, unspecified: Secondary | ICD-10-CM

## 2012-05-16 MED ORDER — ALBUTEROL SULFATE (2.5 MG/3ML) 0.083% IN NEBU: 2.5000 mg | INHALATION_SOLUTION | Freq: Four times a day (QID) | RESPIRATORY_TRACT | Status: DC

## 2012-05-16 MED ORDER — DILTIAZEM HCL ER COATED BEADS 180 MG PO CP24: 180.0000 mg | ORAL_CAPSULE | Freq: Every day | ORAL | Status: DC

## 2012-05-16 MED ORDER — FUROSEMIDE 20 MG PO TABS: 20.0000 mg | ORAL_TABLET | Freq: Every day | ORAL | Status: DC

## 2012-05-16 MED ORDER — BECLOMETHASONE DIPROPIONATE 80 MCG/ACT IN AERS: 2.0000 | INHALATION_SPRAY | Freq: Two times a day (BID) | RESPIRATORY_TRACT | Status: DC

## 2012-05-16 MED ORDER — OMEPRAZOLE 20 MG PO CPDR: 20.0000 mg | DELAYED_RELEASE_CAPSULE | Freq: Two times a day (BID) | ORAL | Status: DC

## 2012-05-16 MED ORDER — ATORVASTATIN CALCIUM 80 MG PO TABS: 80.0000 mg | ORAL_TABLET | Freq: Every day | ORAL | Status: DC

## 2012-05-16 MED ORDER — AMITRIPTYLINE HCL 25 MG PO TABS: 25.0000 mg | ORAL_TABLET | Freq: Every day | ORAL | Status: DC

## 2012-05-16 MED ORDER — HYOSCYAMINE SULFATE 0.125 MG SL SUBL: 0.1250 mg | SUBLINGUAL_TABLET | SUBLINGUAL | Status: DC | PRN

## 2012-05-16 MED ORDER — LEVOTHYROXINE SODIUM 50 MCG PO TABS: 50.0000 ug | ORAL_TABLET | Freq: Every day | ORAL | Status: DC

## 2012-05-16 MED ORDER — NITROGLYCERIN 0.4 MG SL SUBL: 0.4000 mg | SUBLINGUAL_TABLET | SUBLINGUAL | Status: DC | PRN

## 2012-05-16 MED ORDER — ALBUTEROL SULFATE HFA 108 (90 BASE) MCG/ACT IN AERS: 2.0000 | INHALATION_SPRAY | Freq: Four times a day (QID) | RESPIRATORY_TRACT | Status: DC | PRN

## 2012-05-16 MED ORDER — MOMETASONE FUROATE 50 MCG/ACT NA SUSP: 2.0000 | NASAL | Status: DC | PRN

## 2012-05-16 MED ORDER — ESTROGENS, CONJUGATED 0.625 MG/GM VA CREA: 1.0000 g | TOPICAL_CREAM | VAGINAL | Status: DC

## 2012-05-16 MED ORDER — MONTELUKAST SODIUM 10 MG PO TABS: 10.0000 mg | ORAL_TABLET | Freq: Every day | ORAL | Status: DC

## 2012-05-16 MED ORDER — POTASSIUM CHLORIDE CRYS ER 20 MEQ PO TBCR: 20.0000 meq | EXTENDED_RELEASE_TABLET | Freq: Every day | ORAL | Status: DC

## 2012-05-16 NOTE — Patient Instructions (Signed)
Continue your current medications  Try Elavil 25 mg each bedtime for sleep dysfunction. If in 3 weeks her not sleeping well double dose to 50 mg: 6 weeks with a progress report  Return for yearly followup

## 2012-05-16 NOTE — Progress Notes (Signed)
  Subjective:    Patient ID: Rebecca [name], female    DOB: 11/02/1945, 66 y.o.   MRN: [id number]  HPI Bob is a 66 year old married female nonsmoker who comes in today for a Medicare wellness examination because of a history of underlying hyperlipidemia, hypertension, hypothyroidism, coronary artery disease status post MI, reflux esophagitis, osteoarthritis, allergic rhinitis, history of asthma,  Her medications reviewed there've been no changes. I recommended she stop the Xanax. She hasn't taken any this year. She continues to have sleep dysfunction. Sugars sleep wakeup can go back to sleep. She and her husband are currently in counseling because of marital discord. However the sleep dysfunction has been long-term. We discussed various options. She elects to try some low-dose Elavil versus consult with Dr. Nolen Mu  She gets routine eye care, dental care, BSE monthly, and you mammography, tetanus 2012, Pneumovax x2, seasonal flu shot 2013, shingles 2011  Cognitive function normal she walks on a regular basis home health safety reviewed no issues identified, no guns in the house, she does have a health care power of attorney and living will    Review of Systems  Constitutional: Negative.   HENT: Negative.   Eyes: Negative.   Respiratory: Negative.   Cardiovascular: Negative.   Gastrointestinal: Negative.   Genitourinary: Negative.   Musculoskeletal: Negative.   Neurological: Negative.   Hematological: Negative.   Psychiatric/Behavioral: Negative.        Objective:   Physical Exam  Constitutional: She appears well-developed and well-nourished.  HENT:  Head: Normocephalic and atraumatic.  Right Ear: External ear normal.  Left Ear: External ear normal.  Nose: Nose normal.  Mouth/Throat: Oropharynx is clear and moist.  Eyes: EOM are normal. Pupils are equal, round, and reactive to light.  Neck: Normal range of motion. Neck supple. No thyromegaly present.  Cardiovascular: Normal  rate, regular rhythm, normal heart sounds and intact distal pulses.  Exam reveals no gallop and no friction rub.   No murmur heard. Pulmonary/Chest: Effort normal and breath sounds normal.  Abdominal: Soft. Bowel sounds are normal. She exhibits no distension and no mass. There is no tenderness. There is no rebound.  Genitourinary:       Bilateral breast exam normal chronic inverted nipples  Musculoskeletal: Normal range of motion.  Lymphadenopathy:    She has no cervical adenopathy.  Neurological: She is alert. She has normal reflexes. No cranial nerve deficit. She exhibits normal muscle tone. Coordination normal.  Skin: Skin is warm and dry.  Psychiatric: She has a normal mood and affect. Her behavior is normal. Judgment and thought content normal.          Assessment & Plan:  Healthy female  Asthma continue current therapy  Hyperlipidemia continue current therapy  Hypertension continue current therapy  -year-old bowel syndrome continue current therapy via GI  Hypothyroidism continue Synthroid 50 mcg daily  Coronary disease asymptomatic continue followup  Insomnia trial of Elavil 25 mg each bedtime  History of skin cancer normal skin exam

## 2012-05-23 ENCOUNTER — Telehealth: Payer: Self-pay | Admitting: Family Medicine

## 2012-05-23 MED ORDER — FUROSEMIDE 20 MG PO TABS: 20.0000 mg | ORAL_TABLET | Freq: Two times a day (BID) | ORAL | Status: DC | PRN

## 2012-05-23 NOTE — Telephone Encounter (Signed)
Calling to clarify SIG on furosemide 20mg . Please call back. Thanks. Reference Order # [id number].

## 2012-05-23 NOTE — Telephone Encounter (Signed)
New Rx with new directions sent

## 2012-06-17 ENCOUNTER — Telehealth: Payer: Self-pay | Admitting: Family Medicine

## 2012-06-17 NOTE — Telephone Encounter (Signed)
Okay to reorder?

## 2012-06-17 NOTE — Telephone Encounter (Signed)
Pt needs script to replace tubing and mouthpiece on Nebulizer. Pls fax  to  [facility] [address].  Pt phone no. [phone]

## 2012-06-17 NOTE — Telephone Encounter (Signed)
Okay 

## 2012-06-29 DIAGNOSIS — R350 Frequency of micturition: Secondary | ICD-10-CM | POA: Diagnosis not present

## 2012-06-29 DIAGNOSIS — N3000 Acute cystitis without hematuria: Secondary | ICD-10-CM | POA: Diagnosis not present

## 2012-07-03 ENCOUNTER — Encounter: Payer: Self-pay | Admitting: Family Medicine

## 2012-07-03 ENCOUNTER — Other Ambulatory Visit: Payer: Self-pay | Admitting: Family Medicine

## 2012-07-03 ENCOUNTER — Ambulatory Visit (INDEPENDENT_AMBULATORY_CARE_PROVIDER_SITE_OTHER): Payer: Medicare Other | Admitting: Family Medicine

## 2012-07-03 ENCOUNTER — Telehealth: Payer: Self-pay | Admitting: Family Medicine

## 2012-07-03 VITALS — BP 140/80 | Temp 97.7°F | Wt 153.0 lb

## 2012-07-03 DIAGNOSIS — R109 Unspecified abdominal pain: Secondary | ICD-10-CM

## 2012-07-03 DIAGNOSIS — R52 Pain, unspecified: Secondary | ICD-10-CM

## 2012-07-03 DIAGNOSIS — N390 Urinary tract infection, site not specified: Secondary | ICD-10-CM

## 2012-07-03 DIAGNOSIS — M549 Dorsalgia, unspecified: Secondary | ICD-10-CM

## 2012-07-03 LAB — POCT URINALYSIS DIPSTICK
Blood, UA: NEGATIVE
Ketones, UA: NEGATIVE
Protein, UA: NEGATIVE
Spec Grav, UA: <=1.005
Urobilinogen, UA: 0.2

## 2012-07-03 MED ORDER — HYDROCODONE-ACETAMINOPHEN 7.5-750 MG PO TABS: ORAL_TABLET | ORAL | Status: DC

## 2012-07-03 NOTE — Telephone Encounter (Signed)
appt scheduled

## 2012-07-03 NOTE — Telephone Encounter (Signed)
Patient called stating that her infection is a lot worse now as she is in a lot of pain and would like to come in today. Please assist.

## 2012-07-03 NOTE — Telephone Encounter (Signed)
Okay to work in

## 2012-07-03 NOTE — Patient Instructions (Addendum)
We will get you set up for a scan  Drink lots of water  Vicodin one half to one tablet 3-4 times daily when necessary for pain  I will call you and I get the report on your scan  Strain all urines

## 2012-07-03 NOTE — Progress Notes (Signed)
  Subjective:    Patient ID: Rebecca [name], female    DOB: Jul 10, 1946, 66 y.o.   MRN: [id number]  HPI Rebecca [name] is a 66 year old married female nonsmoker who comes in today for evaluation of acute right flank pain  She states last Saturday she was in [place] and began having urinary tract symptoms of frequency and dysuria no fever chills or back pain. She went to an urgent care was told she had a urinary tract infection and was given Macrodantin 100 mg twice a day. She seemed to feel well until last night around 10:30 PM when she had the sudden onset of right flank pain. She states the pain will come and go it's a 5 on a scale of 1-10. It feels like a spastic pain and does not radiate. She has no fever chills nausea vomiting or diarrhea. She's never had a kidney stone in the past although her father was a frequent stone producer.    Review of Systems    general and urinary tract review of systems otherwise negative Objective:   Physical Exam Well-developed well nourished female in no acute distress examination the spine is normal back normal urinalysis normal       Assessment & Plan:  Right flank pain rule out kidney stone plan scan today

## 2012-07-04 ENCOUNTER — Ambulatory Visit: Payer: Medicare Other | Admitting: Family Medicine

## 2012-07-05 ENCOUNTER — Inpatient Hospital Stay (INDEPENDENT_AMBULATORY_CARE_PROVIDER_SITE_OTHER)
Admit: 2012-07-05 | Discharge: 2012-07-05 | Disposition: A | Discharge status: Home / Self Care | Payer: Medicare Other | Attending: Family Medicine | Admitting: Family Medicine

## 2012-07-05 DIAGNOSIS — R52 Pain, unspecified: Secondary | ICD-10-CM

## 2012-07-05 DIAGNOSIS — R109 Unspecified abdominal pain: Secondary | ICD-10-CM

## 2012-10-17 ENCOUNTER — Encounter (HOSPITAL_COMMUNITY): Payer: Self-pay

## 2012-10-17 ENCOUNTER — Emergency Department (HOSPITAL_COMMUNITY)
Admission: EM | Admit: 2012-10-17 | Discharge: 2012-10-17 | Disposition: A | Discharge status: Home / Self Care | Payer: Medicare Other | Attending: Emergency Medicine | Admitting: Emergency Medicine

## 2012-10-17 DIAGNOSIS — R252 Cramp and spasm: Secondary | ICD-10-CM | POA: Diagnosis not present

## 2012-10-17 DIAGNOSIS — Z7982 Long term (current) use of aspirin: Secondary | ICD-10-CM | POA: Diagnosis not present

## 2012-10-17 DIAGNOSIS — Z79899 Other long term (current) drug therapy: Secondary | ICD-10-CM | POA: Diagnosis not present

## 2012-10-17 LAB — BASIC METABOLIC PANEL
BUN: 23 mg/dL (ref 6–23)
CO2: 24 mEq/L (ref 19–32)
Calcium: 9.6 mg/dL (ref 8.4–10.5)
Chloride: 99 mEq/L (ref 96–112)
Creatinine, Ser: 0.89 mg/dL (ref 0.50–1.10)

## 2012-10-17 MED ORDER — OXYCODONE-ACETAMINOPHEN 5-325 MG PO TABS: 1.0000 | ORAL_TABLET | ORAL | Status: DC | PRN

## 2012-10-17 MED ORDER — MORPHINE SULFATE 4 MG/ML IJ SOLN
6.0000 mg | Freq: Once | INTRAMUSCULAR | Status: AC
  Administered 2012-10-17: 6 mg via INTRAVENOUS
  Filled 2012-10-17: qty 2

## 2012-10-17 MED ORDER — DIAZEPAM 5 MG PO TABS: 5.0000 mg | ORAL_TABLET | Freq: Four times a day (QID) | ORAL | Status: DC | PRN

## 2012-10-17 MED ORDER — DIAZEPAM 5 MG PO TABS
5.0000 mg | ORAL_TABLET | Freq: Once | ORAL | Status: AC
  Administered 2012-10-17: 5 mg via ORAL
  Filled 2012-10-17: qty 1

## 2012-10-17 NOTE — ED Notes (Signed)
Pt complains of bilateral leg cramps, states she takes lasix and will have a low potassium

## 2012-10-17 NOTE — ED Provider Notes (Signed)
History     CSN: [id number]  Arrival date & time 10/17/12  0246   First MD Initiated Contact with Patient 10/17/12 0320      Chief Complaint  Patient presents with  . Leg Pain   The history is provided by the patient.   patient reports developing severe cramping of her bilateral lower extremities this evening.  Her pain is severe at this time.  She took one of her husbands Robaxin as well as some over-the-counter pain medication without improvement in her symptoms.  She is on Lasix and therefore she also took a potassium pill as she states that sometimes she has low potassium as the cause of her cramps.  She was very active through the winter time and now in the warmer weather over the past several days she's been doing a significant amount of yard work including [place].  Her husband states she's been working extensively on this.  The patient denies numbness or weakness.  No fevers or chills.  Nothing improves or worsens her symptoms.  She has no other complaints  History reviewed. No pertinent past medical history.  History reviewed. No pertinent past surgical history.  History reviewed. No pertinent family history.  History  Substance Use Topics  . Smoking status: Not on file  . Smokeless tobacco: Not on file  . Alcohol Use: No    OB History   Grav Para Term Preterm Abortions TAB SAB Ect Mult Living                  Review of Systems  All other systems reviewed and are negative.    Allergies  Sulfa antibiotics  Home Medications   Current Outpatient Rx  Name  Route  Sig  Dispense  Refill  . albuterol (PROVENTIL HFA;VENTOLIN HFA) 108 (90 BASE) MCG/ACT inhaler   Inhalation   Inhale 2 puffs into the lungs every 6 (six) hours as needed for wheezing.         Marland Kitchen albuterol (PROVENTIL) (5 MG/ML) 0.5% nebulizer solution   Nebulization   Take 2.5 mg by nebulization every 6 (six) hours as needed for wheezing.         Marland Kitchen aspirin 325  MG tablet   Oral   Take 325 mg by mouth daily.         . beclomethasone (QVAR) 80 MCG/ACT inhaler   Inhalation   Inhale 1 puff into the lungs 2 (two) times daily as needed. Shortness of breath         . calcium carbonate (OS-CAL) 600 MG TABS   Oral   Take 600 mg by mouth daily.         . cetirizine (ZYRTEC) 10 MG tablet   Oral   Take 10 mg by mouth daily as needed for allergies.         Marland Kitchen conjugated estrogens (PREMARIN) vaginal cream   Vaginal   Place 0.5 g vaginally daily.         Marland Kitchen diltiazem (DILACOR XR) 180 MG 24 hr capsule   Oral   Take 180 mg by mouth daily.         . furosemide (LASIX) 20 MG tablet   Oral   Take 20 mg by mouth daily.         Marland Kitchen ibuprofen (ADVIL,MOTRIN) 200 MG tablet   Oral   Take 600 mg by mouth every 6 (six) hours as needed for pain.         Marland Kitchen  levothyroxine (SYNTHROID, LEVOTHROID) 50 MCG tablet   Oral   Take 50 mcg by mouth daily before breakfast.         . mometasone (NASONEX) 50 MCG/ACT nasal spray   Nasal   Place 2 sprays into the nose daily as needed. breathing         . montelukast (SINGULAIR) 10 MG tablet   Oral   Take 10 mg by mouth as needed.         . nitroGLYCERIN (NITROSTAT) 0.4 MG SL tablet   Sublingual   Place 0.4 mg under the tongue every 5 (five) minutes as needed for chest pain.         Marland Kitchen omega-3 acid ethyl esters (LOVAZA) 1 G capsule   Oral   Take 1 g by mouth daily.         . potassium chloride SA (K-DUR,KLOR-CON) 20 MEQ tablet   Oral   Take 20 mEq by mouth daily.         . salicylic acid 6 % gel   Topical   Apply 1 application topically as needed. For feet         . diazepam (VALIUM) 5 MG tablet   Oral   Take 1 tablet (5 mg total) by mouth every 6 (six) hours as needed for anxiety.   12 tablet   0   . oxyCODONE-acetaminophen (PERCOCET/ROXICET) 5-325 MG per tablet   Oral   Take 1 tablet by mouth every 4 (four) hours as needed for pain.   20 tablet   0     BP 125/64  Pulse  80  Temp(Src) 97.8 F (36.6 C) (Oral)  Resp 18  SpO2 97%  Physical Exam  Nursing note and vitals reviewed. Constitutional: She is oriented to person, place, and time. She appears well-developed and well-nourished. No distress.  HENT:  Head: Normocephalic and atraumatic.  Eyes: EOM are normal.  Neck: Normal range of motion.  Cardiovascular: Normal rate, regular rhythm and normal heart sounds.   Pulmonary/Chest: Effort normal and breath sounds normal.  Abdominal: Soft. She exhibits no distension. There is no tenderness.  Musculoskeletal: She exhibits no edema.  Symmetric bilateral lower extremities.  No unilateral edema.  Normal pulses in her bilateral feet.  No obviously grams noted on examination.  No erythema warmth or focal tenderness of her bilateral lower extremities.  Neurological: She is alert and oriented to person, place, and time.  Skin: Skin is warm and dry.  Psychiatric: She has a normal mood and affect. Judgment normal.    ED Course  Procedures (including critical care time)  Labs Reviewed  BASIC METABOLIC PANEL - Abnormal; Notable for the following:    Sodium 133 (*)    Glucose, Bld 166 (*)    GFR calc non Af Amer 65 (*)    GFR calc Af Amer 75 (*)    All other components within normal limits   No results found.   1. Leg cramps       MDM  5:42 AM The patient feels much better after Valium and pain medicine.  This is likely cramping of her lower extremities secondary to increased activity over the past several days.  Discharge home in good condition.  She understands return to the ER for new or worsening symptoms        Lyanne Co, MD 10/17/12 [phone]

## 2012-10-25 ENCOUNTER — Inpatient Hospital Stay: Admit: 2012-10-25 | Payer: Medicare Other

## 2012-10-25 ENCOUNTER — Inpatient Hospital Stay
Admit: 2012-10-25 | Discharge: 2012-10-25 | Disposition: A | Discharge status: Home / Self Care | Payer: Medicare Other | Attending: Family Medicine | Admitting: Family Medicine

## 2012-10-25 DIAGNOSIS — Z1231 Encounter for screening mammogram for malignant neoplasm of breast: Secondary | ICD-10-CM | POA: Diagnosis not present

## 2012-11-05 ENCOUNTER — Encounter: Payer: Self-pay | Admitting: Family Medicine

## 2012-11-05 DIAGNOSIS — Z85828 Personal history of other malignant neoplasm of skin: Secondary | ICD-10-CM | POA: Diagnosis not present

## 2012-11-05 DIAGNOSIS — D239 Other benign neoplasm of skin, unspecified: Secondary | ICD-10-CM | POA: Diagnosis not present

## 2012-11-05 DIAGNOSIS — Z85820 Personal history of malignant melanoma of skin: Secondary | ICD-10-CM | POA: Diagnosis not present

## 2012-11-05 DIAGNOSIS — L821 Other seborrheic keratosis: Secondary | ICD-10-CM | POA: Diagnosis not present

## 2012-12-19 ENCOUNTER — Telehealth: Payer: Self-pay | Admitting: Cardiology

## 2012-12-19 NOTE — Telephone Encounter (Signed)
New problem    Pt wants referral to new cardiologist

## 2012-12-19 NOTE — Telephone Encounter (Signed)
Pt aware Dr. Daleen Squibb has recommended her to see Dr. Delton See when she comes in this September. Mylo Red RN

## 2013-01-08 DIAGNOSIS — H04129 Dry eye syndrome of unspecified lacrimal gland: Secondary | ICD-10-CM | POA: Diagnosis not present

## 2013-01-08 DIAGNOSIS — H5200 Hypermetropia, unspecified eye: Secondary | ICD-10-CM | POA: Diagnosis not present

## 2013-01-08 DIAGNOSIS — H40249 Residual stage of angle-closure glaucoma, unspecified eye: Secondary | ICD-10-CM | POA: Diagnosis not present

## 2013-01-08 DIAGNOSIS — H259 Unspecified age-related cataract: Secondary | ICD-10-CM | POA: Diagnosis not present

## 2013-03-24 ENCOUNTER — Other Ambulatory Visit: Payer: Self-pay | Admitting: *Deleted

## 2013-03-24 ENCOUNTER — Other Ambulatory Visit: Payer: Self-pay | Admitting: Family Medicine

## 2013-03-24 DIAGNOSIS — R5381 Other malaise: Secondary | ICD-10-CM

## 2013-03-24 DIAGNOSIS — E785 Hyperlipidemia, unspecified: Secondary | ICD-10-CM

## 2013-03-24 DIAGNOSIS — I10 Essential (primary) hypertension: Secondary | ICD-10-CM

## 2013-03-24 MED ORDER — LEVOTHYROXINE SODIUM 50 MCG PO TABS: 50.0000 ug | ORAL_TABLET | Freq: Every day | ORAL | Status: DC

## 2013-03-26 ENCOUNTER — Telehealth: Payer: Self-pay | Admitting: Family Medicine

## 2013-03-26 NOTE — Telephone Encounter (Signed)
Pt states [facility] did not get the rx for levothyroxine (SYNTHROID, LEVOTHROID) 50 MCG tablet They called her on 9/9 to say they had not received.

## 2013-04-14 ENCOUNTER — Encounter: Payer: Self-pay | Admitting: Cardiology

## 2013-04-14 ENCOUNTER — Ambulatory Visit (INDEPENDENT_AMBULATORY_CARE_PROVIDER_SITE_OTHER): Payer: Medicare Other | Admitting: Cardiology

## 2013-04-14 VITALS — BP 145/80 | HR 76 | Ht 58.5 in | Wt 157.1 lb

## 2013-04-14 DIAGNOSIS — R0602 Shortness of breath: Secondary | ICD-10-CM

## 2013-04-14 DIAGNOSIS — I2510 Atherosclerotic heart disease of native coronary artery without angina pectoris: Secondary | ICD-10-CM

## 2013-04-14 DIAGNOSIS — R609 Edema, unspecified: Secondary | ICD-10-CM | POA: Diagnosis not present

## 2013-04-14 LAB — TSH: TSH: 0.74 u[IU]/mL (ref 0.35–5.50)

## 2013-04-14 LAB — BRAIN NATRIURETIC PEPTIDE: Pro B Natriuretic peptide (BNP): 29 pg/mL (ref 0.0–100.0)

## 2013-04-14 LAB — LIPID PANEL
Cholesterol: 178 mg/dL (ref 0–200)
HDL: 59.9 mg/dL (ref >39.00)
LDL Cholesterol: 95 mg/dL (ref 0–99)
Total CHOL/HDL Ratio: 3
Triglycerides: 118 mg/dL (ref 0.0–149.0)
VLDL: 23.6 mg/dL (ref 0.0–40.0)

## 2013-04-14 NOTE — Patient Instructions (Addendum)
Your physician recommends that you have lab work today: BNP and LIPID  Your physician has requested that you have an echocardiogram. Echocardiography is a painless test that uses sound waves to create images of your heart. It provides your doctor with information about the size and shape of your heart and how well your heart's chambers and valves are working. This procedure takes approximately one hour. There are no restrictions for this procedure.  Your physician recommends that you schedule a follow-up appointment in: 2 WEEKS with Dr Delton See

## 2013-04-14 NOTE — Progress Notes (Signed)
Patient ID: Rebecca [name], female   DOB: 22-May-1946, 67 y.o.   MRN: [id number]    Patient Name: Rebecca [name] Date of Encounter: 04/14/2013  Primary Care Provider:  Evette Georges, MD Primary Cardiologist:  Tobias Alexander, H  Patient Profile  Re-establish cardiology care  Problem List   Past Medical History  Diagnosis Date  . GERD (gastroesophageal reflux disease)   . MI (myocardial infarction)   . CAD (coronary artery disease)   . Chest pain, atypical   . Hyperlipidemia   . Hypertension   . Fatigue   . Hypothyroidism   . Fatigue   . Edema   . Asthma     extrinsic  . Osteoarthritis   . Keratosis, seborrheic   . Labyrinthitis   . Skin cancer   . IBS (irritable bowel syndrome)     hx   Past Surgical History  Procedure Laterality Date  . Cholecystectomy    . Tubal ligation    . Knee arthroplasty      total right  02-28-2010 Dr Homero Fellers Aluisio, left  . Eus  04/04/2012    Procedure: UPPER ENDOSCOPIC ULTRASOUND (EUS) LINEAR;  Surgeon: Rachael Fee, MD;  Location: [facility];  Service: Endoscopy;  Laterality: N/A;  radial linear    Allergies  Allergies  Allergen Reactions  . Sulfa Antibiotics   . Sulfonamide Derivatives     REACTION: Levonne Spiller syndrome    HPI  67 year old female with h/o CAD, MI in 1992, cath x 3, last time in 2010 with PCI to the RCA. She has been doing well, travelling a lot recently. Denies any chest pain. She has gain about 13 lbs since the last year. She is complaining of lower extremities edema for which she take double dose of lasix with some relief. No palpitations, syncope.  Home Medications  Prior to Admission medications   Medication Sig Start Date End Date Taking? Authorizing Provider  acetaminophen (TYLENOL) 325 MG tablet Take 650 mg by mouth every 6 (six) hours as needed.      Historical Provider, MD  albuterol (PROVENTIL HFA) 108 (90 BASE) MCG/ACT inhaler Inhale 2 puffs into the lungs every 6 (six) hours as needed  for wheezing. 05/16/12   Roderick Pee, MD  albuterol (PROVENTIL HFA;VENTOLIN HFA) 108 (90 BASE) MCG/ACT inhaler Inhale 2 puffs into the lungs every 6 (six) hours as needed for wheezing.    Historical Provider, MD  albuterol (PROVENTIL) (2.5 MG/3ML) 0.083% nebulizer solution Take 3 mLs (2.5 mg total) by nebulization 4 (four) times daily. 05/16/12   Roderick Pee, MD  aspirin 325 MG tablet Take 325 mg by mouth daily.      Historical Provider, MD  atorvastatin (LIPITOR) 80 MG tablet Take 1 tablet (80 mg total) by mouth daily. 05/16/12   Roderick Pee, MD  beclomethasone (QVAR) 80 MCG/ACT inhaler Inhale 2 puffs into the lungs 2 (two) times daily. 05/16/12   Roderick Pee, MD  beclomethasone (QVAR) 80 MCG/ACT inhaler Inhale 1 puff into the lungs 2 (two) times daily as needed. Shortness of breath    Historical Provider, MD  calcium carbonate (OS-CAL) 600 MG TABS Take 600 mg by mouth 2 (two) times daily with a meal.      Historical Provider, MD  calcium carbonate (OS-CAL) 600 MG TABS Take 600 mg by mouth daily.    Historical Provider, MD  cetirizine (ZYRTEC) 10 MG tablet Take 10 mg by mouth daily.  Historical Provider, MD  cetirizine (ZYRTEC) 10 MG tablet Take 10 mg by mouth daily as needed for allergies.    Historical Provider, MD  Cholecalciferol (VITAMIN D) 2000 UNITS CAPS Take 1 capsule by mouth daily.      Historical Provider, MD  conjugated estrogens (PREMARIN) vaginal cream Place 0.25 Applicatorfuls vaginally every 7 (seven) days. weekly 05/16/12   Roderick Pee, MD  conjugated estrogens (PREMARIN) vaginal cream Place 0.5 g vaginally daily.    Historical Provider, MD  diazepam (VALIUM) 5 MG tablet Take 1 tablet (5 mg total) by mouth every 6 (six) hours as needed for anxiety. 10/17/12   Lyanne Co, MD  diltiazem (CARDIZEM CD) 180 MG 24 hr capsule Take 1 capsule (180 mg total) by mouth daily. 05/16/12   Roderick Pee, MD  diltiazem (DILACOR XR) 180 MG 24 hr capsule Take 180 mg by mouth  daily.    Historical Provider, MD  furosemide (LASIX) 20 MG tablet Take 1 tablet (20 mg total) by mouth 2 (two) times daily as needed. 05/23/12   Roderick Pee, MD  HYDROcodone-acetaminophen (VICODIN ES) 7.5-750 MG per tablet One half to one tablet 3 times daily when necessary for pain 07/03/12   Roderick Pee, MD  hyoscyamine (LEVSIN SL) 0.125 MG SL tablet Place 1 tablet (0.125 mg total) under the tongue every 4 (four) hours as needed. 05/16/12   Roderick Pee, MD  ibuprofen (ADVIL,MOTRIN) 200 MG tablet Take 600 mg by mouth every 6 (six) hours as needed for pain.    Historical Provider, MD  levothyroxine (SYNTHROID, LEVOTHROID) 50 MCG tablet Take 1 tablet (50 mcg total) by mouth daily. 05/16/12   Roderick Pee, MD  levothyroxine (SYNTHROID, LEVOTHROID) 50 MCG tablet Take 1 tablet (50 mcg total) by mouth daily before breakfast. 03/24/13   Roderick Pee, MD  Loperamide HCl (IMODIUM A-D PO) Take 1 tablet by mouth as needed.    Historical Provider, MD  Meth-Hyo-M Bl-Benz Acd-Ph Sal (HYOPHEN PO) Take 1 tablet by mouth 2 (two) times daily.    Historical Provider, MD  mometasone (NASONEX) 50 MCG/ACT nasal spray Place 2 sprays into the nose as needed. 05/16/12   Roderick Pee, MD  mometasone (NASONEX) 50 MCG/ACT nasal spray Place 2 sprays into the nose daily as needed. breathing    Historical Provider, MD  montelukast (SINGULAIR) 10 MG tablet Take 1 tablet (10 mg total) by mouth daily. 05/16/12   Roderick Pee, MD  montelukast (SINGULAIR) 10 MG tablet Take 10 mg by mouth as needed.    Historical Provider, MD  Naphazoline-Polyethyl Glycol (EYE DROPS) 0.012-0.2 % SOLN Apply to eye. Artifical eye drops     Historical Provider, MD  nitrofurantoin, macrocrystal-monohydrate, (MACROBID) 100 MG capsule Take 100 mg by mouth 2 (two) times daily.    Historical Provider, MD  nitroGLYCERIN (NITROSTAT) 0.4 MG SL tablet Place 1 tablet (0.4 mg total) under the tongue every 5 (five) minutes as needed. 05/16/12   Roderick Pee, MD  Omega-3 Fatty Acids (FISH OIL) 1200 MG CAPS Take by mouth.      Historical Provider, MD  omeprazole (PRILOSEC) 20 MG capsule Take 1 capsule (20 mg total) by mouth 2 (two) times daily. 05/16/12   Roderick Pee, MD  oxyCODONE-acetaminophen (PERCOCET/ROXICET) 5-325 MG per tablet Take 1 tablet by mouth every 4 (four) hours as needed for pain. 10/17/12   Lyanne Co, MD  potassium chloride SA (K-DUR,KLOR-CON) 20 MEQ tablet Take 1  tablet (20 mEq total) by mouth daily. 05/16/12   Roderick Pee, MD  salicylic acid 6 % gel Apply 1 application topically as needed. For feet    Historical Provider, MD  Salicylic Acid-Cleanser 6 % (CREAM) KIT Apply topically.      Historical Provider, MD    Family History  Family History  Problem Relation Age of Onset  . Melanoma Daughter   . Heart disease Mother   . Breast cancer Sister   . Hypertension Father   . Hypertension Mother   . Hypertension Sister     Social History  History   Social History  . Marital Status: Married    Spouse Name: N/A    Number of Children: 2  . Years of Education: N/A   Occupational History  . RN-retired    Social History Main Topics  . Smoking status: Not on file  . Smokeless tobacco: Not on file  . Alcohol Use: No     Comment: rarely  . Drug Use: No  . Sexual Activity: Not on file   Other Topics Concern  . Not on file   Social History Narrative   ** Merged History Encounter **         Review of Systems General:  No chills, fever, night sweats or weight changes.  Cardiovascular:  No chest pain, dyspnea on exertion,  + lower exetrmities edema, orthopnea, palpitations, paroxysmal nocturnal dyspnea. Dermatological: No rash, lesions/masses Respiratory: No cough, dyspnea Urologic: No hematuria, dysuria Abdominal:   No nausea, vomiting, diarrhea, bright red blood per rectum, melena, or hematemesis Neurologic:  No visual changes, wkns, changes in mental status. All other systems reviewed and are  otherwise negative except as noted above.  Physical Exam  Height 4' 10.5" (1.486 m), weight 157 lb 1.9 oz (71.269 kg).  BP 145/80, HR 76 BPM General: Pleasant, NAD Psych: Normal affect. Neuro: Alert and oriented X 3. Moves all extremities spontaneously. HEENT: Normal  Neck: Supple without bruits or JVD. Lungs:  Resp regular and unlabored, CTA. Heart: RRR no s3, s4, or murmurs. Abdomen: Soft, non-tender, non-distended, BS + x 4.  Extremities: No clubbing, cyanosis or edema. DP/PT/Radials 2+ and equal bilaterally.  Accessory Clinical Findings  ECG - SR, 73 BPM,   Assessment & Plan  67 year old female, 1 year follow up  1. CAD, S/P PCI RCA in 2010, now stable, continue ASA 81 mg QD, atorvastatin 80 mg QD, omega 3 acids, no BB (asthma)  2. Hyperlipidemia - not checked since 2013, will order today  3. Hypertension - elevated today, the patient is unsure if she took her today's mads, we will recheck in 2 weeks and adjust if still high  4. Lower extremity edema - we will check echocardiogram to assess ssytolic and diastolic function  5. H/o hypothyroidism on Synthroid, now weight gain, we will recheck TSH   Follow up in 2 weeks with HTN recheck and results review   Tobias Alexander, Rexene Edison, MD 04/14/2013, 10:44 AM

## 2013-04-21 ENCOUNTER — Other Ambulatory Visit: Payer: Self-pay | Admitting: *Deleted

## 2013-04-21 DIAGNOSIS — I2510 Atherosclerotic heart disease of native coronary artery without angina pectoris: Secondary | ICD-10-CM

## 2013-04-21 MED ORDER — AMITRIPTYLINE HCL 25 MG PO TABS: 25.0000 mg | ORAL_TABLET | Freq: Every day | ORAL | Status: DC

## 2013-04-21 MED ORDER — ATORVASTATIN CALCIUM 80 MG PO TABS: 80.0000 mg | ORAL_TABLET | Freq: Every day | ORAL | Status: DC

## 2013-04-21 MED ORDER — DILTIAZEM HCL ER COATED BEADS 180 MG PO CP24: 180.0000 mg | ORAL_CAPSULE | Freq: Every day | ORAL | Status: DC

## 2013-04-21 MED ORDER — FUROSEMIDE 20 MG PO TABS: 20.0000 mg | ORAL_TABLET | Freq: Two times a day (BID) | ORAL | Status: DC | PRN

## 2013-04-23 ENCOUNTER — Other Ambulatory Visit: Payer: Self-pay | Admitting: *Deleted

## 2013-04-23 DIAGNOSIS — K589 Irritable bowel syndrome without diarrhea: Secondary | ICD-10-CM

## 2013-04-23 DIAGNOSIS — J45909 Unspecified asthma, uncomplicated: Secondary | ICD-10-CM

## 2013-04-23 MED ORDER — MONTELUKAST SODIUM 10 MG PO TABS: 10.0000 mg | ORAL_TABLET | Freq: Every day | ORAL | Status: DC

## 2013-04-23 MED ORDER — MOMETASONE FUROATE 50 MCG/ACT NA SUSP: 2.0000 | NASAL | Status: DC | PRN

## 2013-04-23 MED ORDER — HYOSCYAMINE SULFATE 0.125 MG SL SUBL: 0.1250 mg | SUBLINGUAL_TABLET | SUBLINGUAL | Status: DC | PRN

## 2013-04-28 ENCOUNTER — Other Ambulatory Visit (HOSPITAL_COMMUNITY): Payer: Self-pay | Admitting: Cardiology

## 2013-04-28 ENCOUNTER — Ambulatory Visit (HOSPITAL_COMMUNITY): Payer: Medicare Other | Attending: Cardiology | Admitting: Radiology

## 2013-04-28 DIAGNOSIS — R609 Edema, unspecified: Secondary | ICD-10-CM | POA: Insufficient documentation

## 2013-04-28 DIAGNOSIS — R0602 Shortness of breath: Secondary | ICD-10-CM | POA: Diagnosis not present

## 2013-04-28 DIAGNOSIS — I2510 Atherosclerotic heart disease of native coronary artery without angina pectoris: Secondary | ICD-10-CM | POA: Diagnosis not present

## 2013-04-28 NOTE — Progress Notes (Signed)
Echocardiogram performed.  

## 2013-05-02 ENCOUNTER — Ambulatory Visit (INDEPENDENT_AMBULATORY_CARE_PROVIDER_SITE_OTHER): Payer: Medicare Other | Admitting: Cardiology

## 2013-05-02 ENCOUNTER — Encounter: Payer: Self-pay | Admitting: Cardiology

## 2013-05-02 VITALS — BP 118/64 | HR 84 | Ht 58.5 in | Wt 157.0 lb

## 2013-05-02 DIAGNOSIS — I10 Essential (primary) hypertension: Secondary | ICD-10-CM

## 2013-05-02 DIAGNOSIS — E785 Hyperlipidemia, unspecified: Secondary | ICD-10-CM | POA: Diagnosis not present

## 2013-05-02 NOTE — Progress Notes (Signed)
Patient ID: AKELIA HUSTED, female   DOB: Jul 29, 1945, 67 y.o.   MRN: [id number]  Patient Name: Rebecca [name] Date of Encounter: 05/02/2013  Primary Care Provider:  Evette Georges, MD Primary Cardiologist:  Tobias Alexander, H  Patient Profile  Re-establish cardiology care  Problem List   Past Medical History  Diagnosis Date  . GERD (gastroesophageal reflux disease)   . MI (myocardial infarction)   . CAD (coronary artery disease)   . Chest pain, atypical   . Hyperlipidemia   . Hypertension   . Fatigue   . Hypothyroidism   . Fatigue   . Edema   . Asthma     extrinsic  . Osteoarthritis   . Keratosis, seborrheic   . Labyrinthitis   . Skin cancer   . IBS (irritable bowel syndrome)     hx   Past Surgical History  Procedure Laterality Date  . Cholecystectomy    . Tubal ligation    . Knee arthroplasty      total right  02-28-2010 Dr Homero Fellers Aluisio, left  . Eus  04/04/2012    Procedure: UPPER ENDOSCOPIC ULTRASOUND (EUS) LINEAR;  Surgeon: Rachael Fee, MD;  Location: [facility];  Service: Endoscopy;  Laterality: N/A;  radial linear    Allergies  Allergies  Allergen Reactions  . Sulfa Antibiotics   . Sulfonamide Derivatives     REACTION: Levonne Spiller syndrome    HPI  67 year old female with h/o CAD, MI in 1992, cath x 3, last time in 2010 with PCI to the RCA. She has been doing well, travelling a lot recently. Denies any chest pain. She has gain about 13 lbs since the last year. She is complaining of lower extremities edema for which she take double dose of lasix with some relief. No palpitations, syncope.  Home Medications  Prior to Admission medications   Medication Sig Start Date End Date Taking? Authorizing Provider  acetaminophen (TYLENOL) 325 MG tablet Take 650 mg by mouth every 6 (six) hours as needed.      Historical Provider, MD  albuterol (PROVENTIL HFA) 108 (90 BASE) MCG/ACT inhaler Inhale 2 puffs into the lungs every 6 (six) hours as  needed for wheezing. 05/16/12   Roderick Pee, MD  albuterol (PROVENTIL HFA;VENTOLIN HFA) 108 (90 BASE) MCG/ACT inhaler Inhale 2 puffs into the lungs every 6 (six) hours as needed for wheezing.    Historical Provider, MD  albuterol (PROVENTIL) (2.5 MG/3ML) 0.083% nebulizer solution Take 3 mLs (2.5 mg total) by nebulization 4 (four) times daily. 05/16/12   Roderick Pee, MD  aspirin 325 MG tablet Take 325 mg by mouth daily.      Historical Provider, MD  atorvastatin (LIPITOR) 80 MG tablet Take 1 tablet (80 mg total) by mouth daily. 05/16/12   Roderick Pee, MD  beclomethasone (QVAR) 80 MCG/ACT inhaler Inhale 2 puffs into the lungs 2 (two) times daily. 05/16/12   Roderick Pee, MD  beclomethasone (QVAR) 80 MCG/ACT inhaler Inhale 1 puff into the lungs 2 (two) times daily as needed. Shortness of breath    Historical Provider, MD  calcium carbonate (OS-CAL) 600 MG TABS Take 600 mg by mouth 2 (two) times daily with a meal.      Historical Provider, MD  calcium carbonate (OS-CAL) 600 MG TABS Take 600 mg by mouth daily.    Historical Provider, MD  cetirizine (ZYRTEC) 10 MG tablet Take 10 mg by mouth daily.  Historical Provider, MD  cetirizine (ZYRTEC) 10 MG tablet Take 10 mg by mouth daily as needed for allergies.    Historical Provider, MD  Cholecalciferol (VITAMIN D) 2000 UNITS CAPS Take 1 capsule by mouth daily.      Historical Provider, MD  conjugated estrogens (PREMARIN) vaginal cream Place 0.25 Applicatorfuls vaginally every 7 (seven) days. weekly 05/16/12   Roderick Pee, MD  conjugated estrogens (PREMARIN) vaginal cream Place 0.5 g vaginally daily.    Historical Provider, MD  diazepam (VALIUM) 5 MG tablet Take 1 tablet (5 mg total) by mouth every 6 (six) hours as needed for anxiety. 10/17/12   Lyanne Co, MD  diltiazem (CARDIZEM CD) 180 MG 24 hr capsule Take 1 capsule (180 mg total) by mouth daily. 05/16/12   Roderick Pee, MD  diltiazem (DILACOR XR) 180 MG 24 hr capsule Take 180 mg by  mouth daily.    Historical Provider, MD  furosemide (LASIX) 20 MG tablet Take 1 tablet (20 mg total) by mouth 2 (two) times daily as needed. 05/23/12   Roderick Pee, MD  HYDROcodone-acetaminophen (VICODIN ES) 7.5-750 MG per tablet One half to one tablet 3 times daily when necessary for pain 07/03/12   Roderick Pee, MD  hyoscyamine (LEVSIN SL) 0.125 MG SL tablet Place 1 tablet (0.125 mg total) under the tongue every 4 (four) hours as needed. 05/16/12   Roderick Pee, MD  ibuprofen (ADVIL,MOTRIN) 200 MG tablet Take 600 mg by mouth every 6 (six) hours as needed for pain.    Historical Provider, MD  levothyroxine (SYNTHROID, LEVOTHROID) 50 MCG tablet Take 1 tablet (50 mcg total) by mouth daily. 05/16/12   Roderick Pee, MD  levothyroxine (SYNTHROID, LEVOTHROID) 50 MCG tablet Take 1 tablet (50 mcg total) by mouth daily before breakfast. 03/24/13   Roderick Pee, MD  Loperamide HCl (IMODIUM A-D PO) Take 1 tablet by mouth as needed.    Historical Provider, MD  Meth-Hyo-M Bl-Benz Acd-Ph Sal (HYOPHEN PO) Take 1 tablet by mouth 2 (two) times daily.    Historical Provider, MD  mometasone (NASONEX) 50 MCG/ACT nasal spray Place 2 sprays into the nose as needed. 05/16/12   Roderick Pee, MD  mometasone (NASONEX) 50 MCG/ACT nasal spray Place 2 sprays into the nose daily as needed. breathing    Historical Provider, MD  montelukast (SINGULAIR) 10 MG tablet Take 1 tablet (10 mg total) by mouth daily. 05/16/12   Roderick Pee, MD  montelukast (SINGULAIR) 10 MG tablet Take 10 mg by mouth as needed.    Historical Provider, MD  Naphazoline-Polyethyl Glycol (EYE DROPS) 0.012-0.2 % SOLN Apply to eye. Artifical eye drops     Historical Provider, MD  nitrofurantoin, macrocrystal-monohydrate, (MACROBID) 100 MG capsule Take 100 mg by mouth 2 (two) times daily.    Historical Provider, MD  nitroGLYCERIN (NITROSTAT) 0.4 MG SL tablet Place 1 tablet (0.4 mg total) under the tongue every 5 (five) minutes as needed. 05/16/12    Roderick Pee, MD  Omega-3 Fatty Acids (FISH OIL) 1200 MG CAPS Take by mouth.      Historical Provider, MD  omeprazole (PRILOSEC) 20 MG capsule Take 1 capsule (20 mg total) by mouth 2 (two) times daily. 05/16/12   Roderick Pee, MD  oxyCODONE-acetaminophen (PERCOCET/ROXICET) 5-325 MG per tablet Take 1 tablet by mouth every 4 (four) hours as needed for pain. 10/17/12   Lyanne Co, MD  potassium chloride SA (K-DUR,KLOR-CON) 20 MEQ tablet Take 1  tablet (20 mEq total) by mouth daily. 05/16/12   Roderick Pee, MD  salicylic acid 6 % gel Apply 1 application topically as needed. For feet    Historical Provider, MD  Salicylic Acid-Cleanser 6 % (CREAM) KIT Apply topically.      Historical Provider, MD    Family History  Family History  Problem Relation Age of Onset  . Melanoma Daughter   . Heart disease Mother   . Breast cancer Sister   . Hypertension Father   . Hypertension Mother   . Hypertension Sister     Social History  History   Social History  . Marital Status: Married    Spouse Name: N/A    Number of Children: 2  . Years of Education: N/A   Occupational History  . RN-retired    Social History Main Topics  . Smoking status: Never Smoker   . Smokeless tobacco: Not on file  . Alcohol Use: No     Comment: rarely  . Drug Use: No  . Sexual Activity: Not on file   Other Topics Concern  . Not on file   Social History Narrative   ** Merged History Encounter **         Review of Systems General:  No chills, fever, night sweats or weight changes.  Cardiovascular:  No chest pain, dyspnea on exertion,  + lower exetrmities edema, orthopnea, palpitations, paroxysmal nocturnal dyspnea. Dermatological: No rash, lesions/masses Respiratory: No cough, dyspnea Urologic: No hematuria, dysuria Abdominal:   No nausea, vomiting, diarrhea, bright red blood per rectum, melena, or hematemesis Neurologic:  No visual changes, wkns, changes in mental status. All other systems reviewed  and are otherwise negative except as noted above.  Physical Exam  Today: 118/64, HR 84 BPM 2 weeks ago BP 145/80, HR 76 BPM General: Pleasant, NAD Psych: Normal affect. Neuro: Alert and oriented X 3. Moves all extremities spontaneously. HEENT: Normal  Neck: Supple without bruits or JVD. Lungs:  Resp regular and unlabored, CTA. Heart: RRR no s3, s4, or murmurs. Abdomen: Soft, non-tender, non-distended, BS + x 4.  Extremities: No clubbing, cyanosis or edema. DP/PT/Radials 2+ and equal bilaterally.  Accessory Clinical Findings  ECG - SR, 73 BPM,   Lipid Panel     Component Value Date/Time   CHOL 178 04/14/2013 1143   TRIG 118.0 04/14/2013 1143   HDL 59.90 04/14/2013 1143   CHOLHDL 3 04/14/2013 1143   VLDL 23.6 04/14/2013 1143   LDLCALC 95 04/14/2013 1143   TTE 04/28/2013  Left ventricle: The cavity size was normal. Wall thickness was normal. The estimated ejection fraction was 60%. Wall motion was normal; there were no regional wall motion abnormalities.  Aortic valve: Structurally normal valve. Cusp separation was normal. Doppler: Transvalvular velocity was within the normal range. There was no stenosis. No regurgitation.  Aorta: Aortic root: The aortic root was normal in size.  Mitral valve: Structurally normal valve. Leaflet separation was normal. Doppler: Transvalvular velocity was within the normal range. There was no evidence for stenosis. No regurgitation.  Left atrium: The atrium was normal in size.  Right ventricle: The cavity size was normal. Systolic function was normal.  Pulmonic valve: The valve appears to be grossly normal. Doppler: No significant regurgitation.  Tricuspid valve: Structurally normal valve. Leaflet separation was normal. Doppler: Transvalvular velocity was within the normal range. No regurgitation.  Right atrium: The atrium was normal in size.  Pericardium: There was no pericardial effusion.   Assessment & Plan  66  year old  female, 1 year follow up  1. CAD, S/P PCI RCA in 2010, now stable, continue ASA 81 mg QD, atorvastatin 80 mg QD, omega 3 acids, no BB (asthma)  2. Hyperlipidemia - well controlled, continue same regimen  3. Hypertension - elevated today, the patient is unsure if she took her today's mads, we will recheck in 2 weeks and adjust if still high  4. Lower extremity edema - normal LV systolic function  5. H/o hypothyroidism on Synthroid, TSH normal  Follow up in 1 year.   Tobias Alexander, Rexene Edison, MD 05/02/2013, 10:37 AM

## 2013-05-02 NOTE — Patient Instructions (Signed)
Your physician recommends that you continue on your current medications as directed. Please refer to the Current Medication list given to you today.  Your physician wants you to follow-up in: 1 year. You will receive a reminder letter in the mail two months in advance. If you don't receive a letter, please call our office to schedule the follow-up appointment.  

## 2013-06-09 ENCOUNTER — Other Ambulatory Visit (INDEPENDENT_AMBULATORY_CARE_PROVIDER_SITE_OTHER): Payer: Medicare Other

## 2013-06-09 DIAGNOSIS — I10 Essential (primary) hypertension: Secondary | ICD-10-CM | POA: Diagnosis not present

## 2013-06-09 DIAGNOSIS — R5381 Other malaise: Secondary | ICD-10-CM | POA: Diagnosis not present

## 2013-06-09 DIAGNOSIS — E785 Hyperlipidemia, unspecified: Secondary | ICD-10-CM | POA: Diagnosis not present

## 2013-06-09 LAB — HEPATIC FUNCTION PANEL
AST: 16 U/L (ref 0–37)
Alkaline Phosphatase: 88 U/L (ref 39–117)
Bilirubin, Direct: 0 mg/dL (ref 0.0–0.3)
Total Bilirubin: 0.4 mg/dL (ref 0.3–1.2)
Total Protein: 7 g/dL (ref 6.0–8.3)

## 2013-06-09 LAB — CBC WITH DIFFERENTIAL/PLATELET
Basophils Absolute: 0 10*3/uL (ref 0.0–0.1)
HCT: 40.9 % (ref 36.0–46.0)
Hemoglobin: 13.8 g/dL (ref 12.0–15.0)
Lymphs Abs: 1.8 10*3/uL (ref 0.7–4.0)
MCHC: 33.7 g/dL (ref 30.0–36.0)
MCV: 88.8 fl (ref 78.0–100.0)
Monocytes Absolute: 0.4 10*3/uL (ref 0.1–1.0)
Monocytes Relative: 6.9 % (ref 3.0–12.0)
Neutro Abs: 3.4 10*3/uL (ref 1.4–7.7)
RDW: 13.2 % (ref 11.5–14.6)

## 2013-06-09 LAB — LIPID PANEL: Total CHOL/HDL Ratio: 3

## 2013-06-09 LAB — POCT URINALYSIS DIPSTICK
Bilirubin, UA: NEGATIVE
Blood, UA: NEGATIVE
Leukocytes, UA: NEGATIVE
Nitrite, UA: NEGATIVE
pH, UA: 6

## 2013-06-09 LAB — BASIC METABOLIC PANEL
BUN: 21 mg/dL (ref 6–23)
CO2: 27 mEq/L (ref 19–32)
Calcium: 9.7 mg/dL (ref 8.4–10.5)
Creatinine, Ser: 0.8 mg/dL (ref 0.4–1.2)
GFR: 71.42 mL/min (ref >60.00)
Glucose, Bld: 104 mg/dL — ABNORMAL HIGH (ref 70–99)

## 2013-06-09 LAB — TSH: TSH: 1.05 u[IU]/mL (ref 0.35–5.50)

## 2013-06-17 ENCOUNTER — Encounter: Payer: Medicare Other | Admitting: Family Medicine

## 2013-06-26 ENCOUNTER — Ambulatory Visit (INDEPENDENT_AMBULATORY_CARE_PROVIDER_SITE_OTHER): Payer: Medicare Other | Admitting: Family

## 2013-06-26 ENCOUNTER — Encounter: Payer: Self-pay | Admitting: Family

## 2013-06-26 ENCOUNTER — Encounter: Payer: Medicare Other | Admitting: Family Medicine

## 2013-06-26 ENCOUNTER — Other Ambulatory Visit (HOSPITAL_COMMUNITY)
Admission: RE | Admit: 2013-06-26 | Discharge: 2013-06-26 | Disposition: A | Discharge status: Home / Self Care | Payer: Medicare Other | Source: Ambulatory Visit | Attending: Family | Admitting: Family

## 2013-06-26 VITALS — BP 120/78 | Temp 97.6°F | Ht 59.5 in | Wt 153.0 lb

## 2013-06-26 DIAGNOSIS — Z124 Encounter for screening for malignant neoplasm of cervix: Secondary | ICD-10-CM | POA: Diagnosis not present

## 2013-06-26 DIAGNOSIS — R739 Hyperglycemia, unspecified: Secondary | ICD-10-CM

## 2013-06-26 DIAGNOSIS — Z0000 Encounter for general adult medical examination without abnormal findings: Secondary | ICD-10-CM

## 2013-06-26 DIAGNOSIS — R7309 Other abnormal glucose: Secondary | ICD-10-CM | POA: Diagnosis not present

## 2013-06-26 LAB — HEMOGLOBIN A1C: Hgb A1c MFr Bld: 7 % — ABNORMAL HIGH (ref 4.6–6.5)

## 2013-06-26 MED ORDER — NORTRIPTYLINE HCL 25 MG PO CAPS: 25.0000 mg | ORAL_CAPSULE | Freq: Every day | ORAL | Status: DC

## 2013-06-26 MED ORDER — FLUTICASONE PROPIONATE 50 MCG/ACT NA SUSP: 2.0000 | Freq: Every day | NASAL | Status: DC

## 2013-06-26 NOTE — Progress Notes (Signed)
Pre visit review using our clinic review tool, if applicable. No additional management support is needed unless otherwise documented below in the visit note. 

## 2013-06-26 NOTE — Patient Instructions (Signed)

## 2013-06-26 NOTE — Progress Notes (Signed)
Subjective:    Patient ID: Rebecca [name], female    DOB: 07-02-1946, 67 y.o.   MRN: [id number]  HPI 67 year old WF, nonsmoker, patient of Dr. Tawanna Cooler, is in for a routine physical examination for this healthy  Female. Reviewed all health maintenance protocols including mammography colonoscopy bone density and reviewed appropriate screening labs. Her immunization history was reviewed as well as her current medications and allergies refills of her chronic medications were given and the plan for yearly health maintenance was discussed all orders and referrals were made as appropriate.   Review of Systems  Constitutional: Negative.   HENT: Negative.   Eyes: Negative.   Respiratory: Negative.   Cardiovascular: Negative.   Gastrointestinal: Negative.   Endocrine: Negative.   Genitourinary: Negative.   Musculoskeletal: Negative.   Skin: Negative.   Allergic/Immunologic: Negative.   Neurological: Negative.   Hematological: Negative.   Psychiatric/Behavioral: Negative.    Past Medical History  Diagnosis Date  . GERD (gastroesophageal reflux disease)   . MI (myocardial infarction)   . CAD (coronary artery disease)   . Chest pain, atypical   . Hyperlipidemia   . Hypertension   . Fatigue   . Hypothyroidism   . Fatigue   . Edema   . Asthma     extrinsic  . Osteoarthritis   . Keratosis, seborrheic   . Labyrinthitis   . Skin cancer   . IBS (irritable bowel syndrome)     hx    History   Social History  . Marital Status: Married    Spouse Name: N/A    Number of Children: 2  . Years of Education: N/A   Occupational History  . RN-retired    Social History Main Topics  . Smoking status: Never Smoker   . Smokeless tobacco: Not on file  . Alcohol Use: No     Comment: rarely  . Drug Use: No  . Sexual Activity: Not on file   Other Topics Concern  . Not on file   Social History Narrative   ** Merged History Encounter **        Past Surgical History  Procedure  Laterality Date  . Cholecystectomy    . Tubal ligation    . Knee arthroplasty      total right  02-28-2010 Dr Homero Fellers Aluisio, left  . Eus  04/04/2012    Procedure: UPPER ENDOSCOPIC ULTRASOUND (EUS) LINEAR;  Surgeon: Rachael Fee, MD;  Location: [facility];  Service: Endoscopy;  Laterality: N/A;  radial linear    Family History  Problem Relation Age of Onset  . Melanoma Daughter   . Heart disease Mother   . Breast cancer Sister   . Hypertension Father   . Hypertension Mother   . Hypertension Sister     Allergies  Allergen Reactions  . Sulfa Antibiotics   . Sulfonamide Derivatives     REACTION: Levonne Spiller syndrome    Current Outpatient Prescriptions on File Prior to Visit  Medication Sig Dispense Refill  . acetaminophen (TYLENOL) 325 MG tablet Take 650 mg by mouth every 6 (six) hours as needed.        Marland Kitchen albuterol (PROVENTIL HFA;VENTOLIN HFA) 108 (90 BASE) MCG/ACT inhaler Inhale 2 puffs into the lungs every 6 (six) hours as needed for wheezing (prn).      Marland Kitchen albuterol (PROVENTIL) (2.5 MG/3ML) 0.083% nebulizer solution Take 2.5 mg by nebulization every 4 (four) hours as needed.      Marland Kitchen aspirin 325 MG  tablet Take 325 mg by mouth daily.        Marland Kitchen atorvastatin (LIPITOR) 80 MG tablet Take 1 tablet (80 mg total) by mouth daily.  100 tablet  3  . beclomethasone (QVAR) 80 MCG/ACT inhaler Inhale 2 puffs into the lungs as needed.      . calcium carbonate (OS-CAL) 600 MG TABS Take 600 mg by mouth daily.      . cetirizine (ZYRTEC) 10 MG tablet Take 10 mg by mouth daily as needed for allergies.      . Cholecalciferol (VITAMIN D) 2000 UNITS CAPS Take 1 capsule by mouth daily.        Marland Kitchen conjugated estrogens (PREMARIN) vaginal cream Place 0.25 Applicatorfuls vaginally every 7 (seven) days. weekly  42.5 g  6  . diltiazem (CARDIZEM CD) 180 MG 24 hr capsule Take 1 capsule (180 mg total) by mouth daily.  100 capsule  3  . furosemide (LASIX) 20 MG tablet Take 1 tablet (20 mg total) by mouth 2 (two)  times daily as needed.  180 tablet  3  . hyoscyamine (LEVSIN SL) 0.125 MG SL tablet Place 1 tablet (0.125 mg total) under the tongue every 4 (four) hours as needed.  30 tablet  5  . ibuprofen (ADVIL,MOTRIN) 200 MG tablet Take 600 mg by mouth every 6 (six) hours as needed for pain.      Marland Kitchen levothyroxine (SYNTHROID, LEVOTHROID) 50 MCG tablet Take 50 mcg by mouth as needed.      . Loperamide HCl (IMODIUM A-D PO) Take 1 tablet by mouth as needed.      . montelukast (SINGULAIR) 10 MG tablet Take 10 mg by mouth as needed.      . Naphazoline-Polyethyl Glycol (EYE DROPS) 0.012-0.2 % SOLN Apply to eye as needed. Artifical eye drops      . nitroGLYCERIN (NITROSTAT) 0.4 MG SL tablet Place 1 tablet (0.4 mg total) under the tongue every 5 (five) minutes as needed.  25 tablet  1  . Omega-3 Fatty Acids (FISH OIL) 1200 MG CAPS Take by mouth.        Marland Kitchen omeprazole (PRILOSEC) 20 MG capsule Take 20 mg by mouth 2 (two) times daily as needed.      . potassium chloride SA (K-DUR,KLOR-CON) 20 MEQ tablet Take 1 tablet (20 mEq total) by mouth daily.  90 tablet  3   No current facility-administered medications on file prior to visit.    BP 120/78  Temp(Src) 97.6 F (36.4 C) (Oral)  Ht 4' 11.5" (1.511 m)  Wt 153 lb (69.4 kg)  BMI 30.40 kg/m2chart    Objective:   Physical Exam  Constitutional: She is oriented to person, place, and time. She appears well-developed and well-nourished.  HENT:  Head: Normocephalic and atraumatic.  Right Ear: External ear normal.  Left Ear: External ear normal.  Nose: Nose normal.  Mouth/Throat: Oropharynx is clear and moist.  Eyes: Conjunctivae and EOM are normal. Pupils are equal, round, and reactive to light.  Neck: Normal range of motion. Neck supple. No thyromegaly present.  Cardiovascular: Normal rate, regular rhythm, normal heart sounds and intact distal pulses.   Pulmonary/Chest: Effort normal and breath sounds normal.  Abdominal: Soft. Bowel sounds are normal. She exhibits  no distension. There is no tenderness. There is no rebound.  Genitourinary: Vagina normal and uterus normal. Guaiac negative stool. No vaginal discharge found.  Musculoskeletal: Normal range of motion. She exhibits no edema and no tenderness.  Neurological: She is alert and oriented to  person, place, and time. She has normal reflexes. No cranial nerve deficit. Coordination normal.  Skin: Skin is warm and dry.  Psychiatric: She has a normal mood and affect.          Assessment & Plan:  Assessment: 1. Complete physical exam 2. Depression 3. Peripheral edema 4. Pap smear  Plan: Encouraged healthy diet, exercise, monthly self breast exams. Mammogram to be done in January 2015. Patient verbalizes understanding. Due to cost, changed amitriptyline to nortriptyline and Nasonex to Flonase at patient request. Initial cardiac with any questions or concerns. Recheck in 6 months and sooner as needed. Refer for colonoscopy screening.

## 2013-07-22 ENCOUNTER — Other Ambulatory Visit: Payer: Self-pay | Admitting: *Deleted

## 2013-07-22 MED ORDER — MOMETASONE FUROATE 50 MCG/ACT NA SUSP: 2.0000 | Freq: Every day | NASAL | Status: DC

## 2013-09-15 ENCOUNTER — Other Ambulatory Visit: Payer: Self-pay

## 2013-09-15 DIAGNOSIS — Z1231 Encounter for screening mammogram for malignant neoplasm of breast: Secondary | ICD-10-CM

## 2013-10-10 ENCOUNTER — Encounter: Payer: Self-pay | Admitting: Family Medicine

## 2013-10-10 DIAGNOSIS — I2510 Atherosclerotic heart disease of native coronary artery without angina pectoris: Secondary | ICD-10-CM

## 2013-10-10 DIAGNOSIS — I10 Essential (primary) hypertension: Secondary | ICD-10-CM

## 2013-10-10 DIAGNOSIS — N760 Acute vaginitis: Secondary | ICD-10-CM

## 2013-10-10 MED ORDER — FUROSEMIDE 20 MG PO TABS: 20.0000 mg | ORAL_TABLET | Freq: Two times a day (BID) | ORAL | Status: DC | PRN

## 2013-10-10 MED ORDER — ESTROGENS, CONJUGATED 0.625 MG/GM VA CREA: 1.0000 g | TOPICAL_CREAM | VAGINAL | Status: DC

## 2013-10-10 MED ORDER — NORTRIPTYLINE HCL 25 MG PO CAPS: 25.0000 mg | ORAL_CAPSULE | Freq: Every day | ORAL | Status: DC

## 2013-10-10 MED ORDER — LEVOTHYROXINE SODIUM 50 MCG PO TABS: 50.0000 ug | ORAL_TABLET | ORAL | Status: DC | PRN

## 2013-10-10 MED ORDER — POTASSIUM CHLORIDE CRYS ER 20 MEQ PO TBCR: 20.0000 meq | EXTENDED_RELEASE_TABLET | Freq: Every day | ORAL | Status: DC

## 2013-10-10 MED ORDER — ATORVASTATIN CALCIUM 80 MG PO TABS: 80.0000 mg | ORAL_TABLET | Freq: Every day | ORAL | Status: DC

## 2013-10-10 MED ORDER — DILTIAZEM HCL ER COATED BEADS 180 MG PO CP24: 180.0000 mg | ORAL_CAPSULE | Freq: Every day | ORAL | Status: DC

## 2013-10-27 ENCOUNTER — Ambulatory Visit: Payer: Medicare Other

## 2013-10-29 ENCOUNTER — Ambulatory Visit: Payer: Medicare Other

## 2013-11-04 ENCOUNTER — Ambulatory Visit
Admission: RE | Admit: 2013-11-04 | Discharge: 2013-11-04 | Disposition: A | Discharge status: Home / Self Care | Payer: Medicare Other | Source: Ambulatory Visit

## 2013-11-04 DIAGNOSIS — Z1231 Encounter for screening mammogram for malignant neoplasm of breast: Secondary | ICD-10-CM | POA: Diagnosis not present

## 2013-11-07 ENCOUNTER — Other Ambulatory Visit: Payer: Self-pay | Admitting: Family Medicine

## 2013-11-07 DIAGNOSIS — R928 Other abnormal and inconclusive findings on diagnostic imaging of breast: Secondary | ICD-10-CM

## 2013-11-18 DIAGNOSIS — D239 Other benign neoplasm of skin, unspecified: Secondary | ICD-10-CM | POA: Diagnosis not present

## 2013-11-18 DIAGNOSIS — Z85828 Personal history of other malignant neoplasm of skin: Secondary | ICD-10-CM | POA: Diagnosis not present

## 2013-11-18 DIAGNOSIS — Z85820 Personal history of malignant melanoma of skin: Secondary | ICD-10-CM | POA: Diagnosis not present

## 2013-11-18 DIAGNOSIS — L821 Other seborrheic keratosis: Secondary | ICD-10-CM | POA: Diagnosis not present

## 2013-11-19 ENCOUNTER — Ambulatory Visit
Admission: RE | Admit: 2013-11-19 | Discharge: 2013-11-19 | Disposition: A | Discharge status: Home / Self Care | Payer: Medicare Other | Source: Ambulatory Visit | Attending: Family Medicine | Admitting: Family Medicine

## 2013-11-19 DIAGNOSIS — R928 Other abnormal and inconclusive findings on diagnostic imaging of breast: Secondary | ICD-10-CM | POA: Diagnosis not present

## 2013-11-21 ENCOUNTER — Other Ambulatory Visit: Payer: Self-pay | Admitting: Family Medicine

## 2013-11-21 ENCOUNTER — Other Ambulatory Visit: Payer: Self-pay

## 2013-11-21 DIAGNOSIS — N6009 Solitary cyst of unspecified breast: Secondary | ICD-10-CM

## 2014-01-09 DIAGNOSIS — H5200 Hypermetropia, unspecified eye: Secondary | ICD-10-CM | POA: Diagnosis not present

## 2014-01-09 DIAGNOSIS — H04129 Dry eye syndrome of unspecified lacrimal gland: Secondary | ICD-10-CM | POA: Diagnosis not present

## 2014-01-09 DIAGNOSIS — H40249 Residual stage of angle-closure glaucoma, unspecified eye: Secondary | ICD-10-CM | POA: Diagnosis not present

## 2014-01-09 DIAGNOSIS — H259 Unspecified age-related cataract: Secondary | ICD-10-CM | POA: Diagnosis not present

## 2014-01-11 ENCOUNTER — Ambulatory Visit (INDEPENDENT_AMBULATORY_CARE_PROVIDER_SITE_OTHER): Payer: Medicare Other | Admitting: Emergency Medicine

## 2014-01-11 ENCOUNTER — Ambulatory Visit (INDEPENDENT_AMBULATORY_CARE_PROVIDER_SITE_OTHER): Payer: Medicare Other

## 2014-01-11 VITALS — BP 108/71 | HR 92 | Temp 97.6°F | Resp 20 | Ht 59.0 in | Wt 142.0 lb

## 2014-01-11 DIAGNOSIS — M25579 Pain in unspecified ankle and joints of unspecified foot: Secondary | ICD-10-CM | POA: Diagnosis not present

## 2014-01-11 DIAGNOSIS — M79642 Pain in left hand: Secondary | ICD-10-CM

## 2014-01-11 DIAGNOSIS — M79609 Pain in unspecified limb: Secondary | ICD-10-CM

## 2014-01-11 DIAGNOSIS — I2510 Atherosclerotic heart disease of native coronary artery without angina pectoris: Secondary | ICD-10-CM | POA: Diagnosis not present

## 2014-01-11 DIAGNOSIS — M25571 Pain in right ankle and joints of right foot: Secondary | ICD-10-CM

## 2014-01-11 DIAGNOSIS — S92309A Fracture of unspecified metatarsal bone(s), unspecified foot, initial encounter for closed fracture: Secondary | ICD-10-CM | POA: Diagnosis not present

## 2014-01-11 MED ORDER — HYDROCODONE-ACETAMINOPHEN 5-325 MG PO TABS: 1.0000 | ORAL_TABLET | ORAL | Status: DC | PRN

## 2014-01-11 NOTE — Progress Notes (Addendum)
[facility] [address], [place] [address] [phone]  Date:  01/11/2014   Name:  Rebecca [name]   DOB:  1945-10-28   MRN:  [id number]  PCP:  Joycelyn Man, MD    Chief Complaint: Fall   History of Present Illness:  Rebecca [name] is a 68 y.o. very pleasant female patient who presents with the following:  Injured today whe she lost her footing on a curb and fell, landing face down.  She ha pain in the right foot and ankle with a history of three stress fractures in the past.   She has abrasions on the face and bridge of nose.  Has pain in left hand with bruising and swelling and pain in the left third finger.  No LOC no neuro or visual symptoms.  No improvement with over the counter medications or other home remedies. Denies other complaint or health concern today.Marland Kitchen  Uncertain about last TD  Patient Active Problem List   Diagnosis Date Noted  . Acute right flank pain 07/03/2012  . Nonspecific (abnormal) findings on radiological and other examination of gastrointestinal tract 04/04/2012  . Insomnia, idiopathic 10/04/2010  . HYPERLIPIDEMIA 09/22/2009  . HYPERTENSION 09/22/2009  . CAD, NATIVE VESSEL 09/22/2009  . FATIGUE 08/23/2009  . EDEMA 05/19/2009  . UNSPECIFIED HYPOTHYROIDISM 02/23/2009  . EXTRINSIC ASTHMA, UNSPECIFIED 02/10/2009  . KERATOSIS, SEBORRHEIC [place] 04/16/2007  . SKIN CANCER, HX OF 02/14/2007  . DEPRESSION 01/24/2007  . MYOCARDIAL INFARCTION, HX OF 01/24/2007  . GERD 01/24/2007  . OSTEOARTHRITIS 01/24/2007  . COLONIC POLYPS, HX OF 01/24/2007  . IRRITABLE BOWEL SYNDROME, HX OF 01/24/2007    Past Medical History  Diagnosis Date  . GERD (gastroesophageal reflux disease)   . MI (myocardial infarction)   . CAD (coronary artery disease)   . Chest pain, atypical   . Hyperlipidemia   . Hypertension   . Fatigue   . Hypothyroidism   . Fatigue   . Edema   . Asthma     extrinsic  . Osteoarthritis   . Keratosis, seborrheic   .  Labyrinthitis   . Skin cancer   . IBS (irritable bowel syndrome)     hx    Past Surgical History  Procedure Laterality Date  . Cholecystectomy    . Tubal ligation    . Knee arthroplasty      total right  02-28-2010 Dr Pilar Plate Aluisio, left  . Eus  04/04/2012    Procedure: UPPER ENDOSCOPIC ULTRASOUND (EUS) LINEAR;  Surgeon: Milus Banister, MD;  Location: [facility];  Service: Endoscopy;  Laterality: N/A;  radial linear    History  Substance Use Topics  . Smoking status: Never Smoker   . Smokeless tobacco: Not on file  . Alcohol Use: No     Comment: rarely    Family History  Problem Relation Age of Onset  . Melanoma Daughter   . Heart disease Mother   . Breast cancer Sister   . Hypertension Father   . Hypertension Mother   . Hypertension Sister     Allergies  Allergen Reactions  . Sulfa Antibiotics   . Sulfonamide Derivatives     REACTION: Kathreen Cosier syndrome    Medication list has been reviewed and updated.  Current Outpatient Prescriptions on File Prior to Visit  Medication Sig Dispense Refill  . acetaminophen (TYLENOL) 325 MG tablet Take 650 mg by mouth every 6 (six) hours as needed.        Marland Kitchen albuterol (  PROVENTIL HFA;VENTOLIN HFA) 108 (90 BASE) MCG/ACT inhaler Inhale 2 puffs into the lungs every 6 (six) hours as needed for wheezing (prn).      Marland Kitchen albuterol (PROVENTIL) (2.5 MG/3ML) 0.083% nebulizer solution Take 2.5 mg by nebulization every 4 (four) hours as needed.      Marland Kitchen aspirin 325 MG tablet Take 325 mg by mouth daily.        Marland Kitchen atorvastatin (LIPITOR) 80 MG tablet Take 1 tablet (80 mg total) by mouth daily.  100 tablet  3  . beclomethasone (QVAR) 80 MCG/ACT inhaler Inhale 2 puffs into the lungs as needed.      . calcium carbonate (OS-CAL) 600 MG TABS Take 1,200 mg by mouth daily.       . cetirizine (ZYRTEC) 10 MG tablet Take 10 mg by mouth daily as needed for allergies.      . Cholecalciferol (VITAMIN D) 2000 UNITS CAPS Take 1 capsule by mouth daily.         Marland Kitchen conjugated estrogens (PREMARIN) vaginal cream Place 0.5 Applicatorfuls vaginally every 7 (seven) days.  42.5 g  6  . diltiazem (CARDIZEM CD) 180 MG 24 hr capsule Take 1 capsule (180 mg total) by mouth daily.  100 capsule  3  . fluticasone (FLONASE) 50 MCG/ACT nasal spray Place 2 sprays into both nostrils daily.  48 g  6  . hyoscyamine (LEVSIN SL) 0.125 MG SL tablet Place 1 tablet (0.125 mg total) under the tongue every 4 (four) hours as needed.  30 tablet  5  . ibuprofen (ADVIL,MOTRIN) 200 MG tablet Take 600 mg by mouth every 6 (six) hours as needed for pain.      Marland Kitchen levothyroxine (SYNTHROID, LEVOTHROID) 50 MCG tablet Take 1 tablet (50 mcg total) by mouth as needed.  90 tablet  3  . Loperamide HCl (IMODIUM A-D PO) Take 1 tablet by mouth as needed.      . mometasone (NASONEX) 50 MCG/ACT nasal spray Place 2 sprays into the nose daily.  17 g  12  . montelukast (SINGULAIR) 10 MG tablet Take 10 mg by mouth as needed.      . Naphazoline-Polyethyl Glycol (EYE DROPS) 0.012-0.2 % SOLN Apply to eye as needed. Artifical eye drops      . nitroGLYCERIN (NITROSTAT) 0.4 MG SL tablet Place 1 tablet (0.4 mg total) under the tongue every 5 (five) minutes as needed.  25 tablet  1  . nortriptyline (PAMELOR) 25 MG capsule Take 1 capsule (25 mg total) by mouth at bedtime.  90 capsule  3  . Omega-3 Fatty Acids (FISH OIL) 1200 MG CAPS Take by mouth.        . potassium chloride SA (K-DUR,KLOR-CON) 20 MEQ tablet Take 1 tablet (20 mEq total) by mouth daily.  90 tablet  3   No current facility-administered medications on file prior to visit.    Review of Systems:  As per HPI, otherwise negative.    Physical Examination: Filed Vitals:   01/11/14 1328  BP: 108/71  Pulse: 92  Temp: 97.6 F (36.4 C)  Resp: 20   Filed Vitals:   01/11/14 1328  Height: 4\' 11"  (1.499 m)  Weight: 142 lb (64.411 kg)   Body mass index is 28.67 kg/(m^2). Ideal Body Weight: Weight in (lb) to have BMI = 25: 123.5   GEN: WDWN, NAD,  Non-toxic, Alert & Oriented x 3 HEENT: multiple abrasions face and nose.  No nasal deformity, Normocephalic. PRRERLA EOMI Ears and Nose: No external deformity. EXTR:  No clubbing/cyanosis/edema NEURO: wheelchair bound.  CN 2-12 intact PSYCH: Normally interactive. Conversant. Not depressed or anxious appearing.  Calm demeanor.  RIGHT foot:  Swelling lateral ankle with ecchymosis of the mid foot  No deformity LEFT hand:  Ecchymosis and tenderness palm.  Swelling and decreased mobility third finger   Assessment and Plan: Fracture base of fifth MT Sprain finger Boot Refer to ortho vicodin  Signed,  Ellison Carwin, MD   [facility] reading (PRIMARY) by  Dr. Ouida Sills  Fracture fifth MT, negative finger, negative ankle.

## 2014-01-11 NOTE — Patient Instructions (Signed)
Metatarsal Fracture, Undisplaced  A metatarsal fracture is a break in the bone(s) of the foot. These are the bones of the foot that connect your toes to the bones of the ankle.  DIAGNOSIS   The diagnoses of these fractures are usually made with X-rays. If there are problems in the forefoot and x-rays are normal a later bone scan will usually make the diagnosis.   TREATMENT AND HOME CARE INSTRUCTIONS  · Treatment may or may not include a cast or walking shoe. When casts are needed the use is usually for short periods of time so as not to slow down healing with muscle wasting (atrophy).  · Activities should be stopped until further advised by your caregiver.  · Wear shoes with adequate shock absorbing capabilities and stiff soles.  · Alternative exercise may be undertaken while waiting for healing. These may include bicycling and swimming, or as your caregiver suggests.  · It is important to keep all follow-up visits or specialty referrals. The failure to keep these appointments could result in improper bone healing and chronic pain or disability.  · Warning: Do not drive a car or operate a motor vehicle until your caregiver specifically tells you it is safe to do so.  IF YOU DO NOT HAVE A CAST OR SPLINT:  · You may walk on your injured foot as tolerated or advised.  · Do not put any weight on your injured foot for as long as directed by your caregiver. Slowly increase the amount of time you walk on the foot as the pain allows or as advised.  · Use crutches until you can bear weight without pain. A gradual increase in weight bearing may help.  · Apply ice to the injury for 15-20 minutes each hour while awake for the first 2 days. Put the ice in a plastic bag and place a towel between the bag of ice and your skin.  · Only take over-the-counter or prescription medicines for pain, discomfort, or fever as directed by your caregiver.  SEEK IMMEDIATE MEDICAL CARE IF:   · Your cast gets damaged or breaks.  · You have  continued severe pain or more swelling than you did before the cast was put on, or the pain is not controlled with medications.  · Your skin or nails below the injury turn blue or grey, or feel cold or numb.  · There is a bad smell, or new stains or pus-like (purulent) drainage coming from the cast.  MAKE SURE YOU:   · Understand these instructions.  · Will watch your condition.  · Will get help right away if you are not doing well or get worse.  Document Released: 03/25/2002 Document Revised: 09/25/2011 Document Reviewed: 02/14/2008  Exit[employer]® Patient Information ©2015 [employer]. This information is not intended to replace advice given to you by your health care provider. Make sure you discuss any questions you have with your health care provider.

## 2014-01-12 DIAGNOSIS — S8290XD Unspecified fracture of unspecified lower leg, subsequent encounter for closed fracture with routine healing: Secondary | ICD-10-CM | POA: Diagnosis not present

## 2014-01-27 DIAGNOSIS — M79609 Pain in unspecified limb: Secondary | ICD-10-CM | POA: Diagnosis not present

## 2014-01-27 DIAGNOSIS — S8290XD Unspecified fracture of unspecified lower leg, subsequent encounter for closed fracture with routine healing: Secondary | ICD-10-CM | POA: Diagnosis not present

## 2014-02-24 DIAGNOSIS — S8290XD Unspecified fracture of unspecified lower leg, subsequent encounter for closed fracture with routine healing: Secondary | ICD-10-CM | POA: Diagnosis not present

## 2014-02-26 ENCOUNTER — Encounter: Payer: Self-pay | Admitting: Internal Medicine

## 2014-03-18 DIAGNOSIS — M79609 Pain in unspecified limb: Secondary | ICD-10-CM | POA: Diagnosis not present

## 2014-04-15 DIAGNOSIS — Z23 Encounter for immunization: Secondary | ICD-10-CM | POA: Diagnosis not present

## 2014-05-25 ENCOUNTER — Ambulatory Visit
Admission: RE | Admit: 2014-05-25 | Discharge: 2014-05-25 | Disposition: A | Discharge status: Home / Self Care | Payer: Medicare Other | Source: Ambulatory Visit | Attending: Family Medicine | Admitting: Family Medicine

## 2014-05-25 DIAGNOSIS — N6009 Solitary cyst of unspecified breast: Secondary | ICD-10-CM

## 2014-05-25 DIAGNOSIS — N6002 Solitary cyst of left breast: Secondary | ICD-10-CM | POA: Diagnosis not present

## 2014-06-05 ENCOUNTER — Ambulatory Visit (INDEPENDENT_AMBULATORY_CARE_PROVIDER_SITE_OTHER): Payer: Medicare Other | Admitting: Licensed Clinical Social Worker

## 2014-06-05 DIAGNOSIS — F4323 Adjustment disorder with mixed anxiety and depressed mood: Secondary | ICD-10-CM

## 2014-06-18 ENCOUNTER — Ambulatory Visit (INDEPENDENT_AMBULATORY_CARE_PROVIDER_SITE_OTHER): Payer: Medicare Other | Admitting: Family Medicine

## 2014-06-18 ENCOUNTER — Encounter: Payer: Self-pay | Admitting: Family Medicine

## 2014-06-18 VITALS — BP 130/80

## 2014-06-18 DIAGNOSIS — I2510 Atherosclerotic heart disease of native coronary artery without angina pectoris: Secondary | ICD-10-CM

## 2014-06-18 DIAGNOSIS — F329 Major depressive disorder, single episode, unspecified: Secondary | ICD-10-CM

## 2014-06-18 DIAGNOSIS — F32A Depression, unspecified: Secondary | ICD-10-CM

## 2014-06-18 MED ORDER — TRAZODONE HCL 50 MG PO TABS: ORAL_TABLET | ORAL | Status: DC

## 2014-06-18 MED ORDER — LORAZEPAM 0.5 MG PO TABS: ORAL_TABLET | ORAL | Status: DC

## 2014-06-18 NOTE — Patient Instructions (Signed)
Ativan 0.5.......Marland Kitchen 1 at bedtime  Trazodone 50 mg..... One at bedtime  When your sleeping well stop the Ativan  Return in 6 weeks for follow-up

## 2014-06-18 NOTE — Progress Notes (Signed)
   Subjective:    Patient ID: Rebecca [name], female    DOB: [birth year], 68 y.o.   MRN: [id number]  HPI Rebecca [name] is a 68 year old married female nonsmoker who comes in today for evaluation depression sleep dysfunction  Her daughter died a number of years ago from [place] and she still is having guilt difficulty dealing with that. She and her husband are both in counseling with Rebecca [name]. She took Pamelor 25 mg daily at bedtime for sleep dysfunction it did well for couple years. Over the last couple months does not seem to help. She is doubled to 50 but that makes her too sleepy and drowsy the next day. She would like to discuss other options   Review of Systems    review of systems negative Objective:   Physical Exam  Well-developed well-nourished female no acute distress vital signs stable she's afebrile  She is alert oriented appropriate not tearful not suicidal      Assessment & Plan:  Depression with sleep dysfunction.......Rebecca [name] DC Pamelor..........Rebecca [name] Ativan 0.5........ Trazodone 50 mg....... Follow-up in 6 weeks,,,,,,,, discontinue Ativan when your sleeping better

## 2014-06-18 NOTE — Progress Notes (Signed)
Pre visit review using our clinic review tool, if applicable. No additional management support is needed unless otherwise documented below in the visit note. 

## 2014-06-26 ENCOUNTER — Ambulatory Visit (INDEPENDENT_AMBULATORY_CARE_PROVIDER_SITE_OTHER): Payer: Medicare Other | Admitting: Licensed Clinical Social Worker

## 2014-06-26 DIAGNOSIS — F4323 Adjustment disorder with mixed anxiety and depressed mood: Secondary | ICD-10-CM

## 2014-06-30 ENCOUNTER — Encounter: Payer: Self-pay | Admitting: Family Medicine

## 2014-06-30 ENCOUNTER — Ambulatory Visit (INDEPENDENT_AMBULATORY_CARE_PROVIDER_SITE_OTHER): Payer: Medicare Other | Admitting: Family Medicine

## 2014-06-30 VITALS — BP 130/80 | Temp 97.9°F | Ht 59.25 in | Wt 154.0 lb

## 2014-06-30 DIAGNOSIS — Z23 Encounter for immunization: Secondary | ICD-10-CM | POA: Diagnosis not present

## 2014-06-30 DIAGNOSIS — F329 Major depressive disorder, single episode, unspecified: Secondary | ICD-10-CM

## 2014-06-30 DIAGNOSIS — N760 Acute vaginitis: Secondary | ICD-10-CM

## 2014-06-30 DIAGNOSIS — E039 Hypothyroidism, unspecified: Secondary | ICD-10-CM

## 2014-06-30 DIAGNOSIS — I2510 Atherosclerotic heart disease of native coronary artery without angina pectoris: Secondary | ICD-10-CM

## 2014-06-30 DIAGNOSIS — R739 Hyperglycemia, unspecified: Secondary | ICD-10-CM

## 2014-06-30 DIAGNOSIS — F32A Depression, unspecified: Secondary | ICD-10-CM

## 2014-06-30 DIAGNOSIS — E785 Hyperlipidemia, unspecified: Secondary | ICD-10-CM | POA: Diagnosis not present

## 2014-06-30 DIAGNOSIS — I10 Essential (primary) hypertension: Secondary | ICD-10-CM | POA: Diagnosis not present

## 2014-06-30 DIAGNOSIS — F5101 Primary insomnia: Secondary | ICD-10-CM | POA: Diagnosis not present

## 2014-06-30 DIAGNOSIS — R7309 Other abnormal glucose: Secondary | ICD-10-CM | POA: Diagnosis not present

## 2014-06-30 LAB — LIPID PANEL
CHOLESTEROL: 151 mg/dL (ref 0–200)
HDL: 47.8 mg/dL (ref >39.00)
LDL Cholesterol: 82 mg/dL (ref 0–99)
NonHDL: 103.2
TRIGLYCERIDES: 105 mg/dL (ref 0.0–149.0)
Total CHOL/HDL Ratio: 3
VLDL: 21 mg/dL (ref 0.0–40.0)

## 2014-06-30 LAB — CBC WITH DIFFERENTIAL/PLATELET
BASOS PCT: 0.4 % (ref 0.0–3.0)
Basophils Absolute: 0 10*3/uL (ref 0.0–0.1)
EOS ABS: 0.1 10*3/uL (ref 0.0–0.7)
Eosinophils Relative: 1.8 % (ref 0.0–5.0)
HCT: 42.6 % (ref 36.0–46.0)
Hemoglobin: 14.2 g/dL (ref 12.0–15.0)
Lymphocytes Relative: 33 % (ref 12.0–46.0)
Lymphs Abs: 1.9 10*3/uL (ref 0.7–4.0)
MCHC: 33.3 g/dL (ref 30.0–36.0)
MCV: 90.6 fl (ref 78.0–100.0)
MONO ABS: 0.4 10*3/uL (ref 0.1–1.0)
Monocytes Relative: 6.9 % (ref 3.0–12.0)
NEUTROS PCT: 57.9 % (ref 43.0–77.0)
Neutro Abs: 3.4 10*3/uL (ref 1.4–7.7)
Platelets: 339 10*3/uL (ref 150.0–400.0)
RBC: 4.71 Mil/uL (ref 3.87–5.11)
RDW: 13.1 % (ref 11.5–15.5)
WBC: 5.8 10*3/uL (ref 4.0–10.5)

## 2014-06-30 LAB — MICROALBUMIN / CREATININE URINE RATIO
CREATININE, U: 11.7 mg/dL
Microalb Creat Ratio: 1.7 mg/g (ref 0.0–30.0)
Microalb, Ur: 0.2 mg/dL (ref 0.0–1.9)

## 2014-06-30 LAB — HEPATIC FUNCTION PANEL
ALK PHOS: 98 U/L (ref 39–117)
ALT: 41 U/L — AB (ref 0–35)
AST: 28 U/L (ref 0–37)
Albumin: 4.4 g/dL (ref 3.5–5.2)
BILIRUBIN DIRECT: 0 mg/dL (ref 0.0–0.3)
TOTAL PROTEIN: 7.2 g/dL (ref 6.0–8.3)
Total Bilirubin: 0.5 mg/dL (ref 0.2–1.2)

## 2014-06-30 LAB — BASIC METABOLIC PANEL
BUN: 17 mg/dL (ref 6–23)
CALCIUM: 10.1 mg/dL (ref 8.4–10.5)
CO2: 27 mEq/L (ref 19–32)
Chloride: 103 mEq/L (ref 96–112)
Creatinine, Ser: 0.9 mg/dL (ref 0.4–1.2)
GFR: 64.92 mL/min (ref >60.00)
Glucose, Bld: 160 mg/dL — ABNORMAL HIGH (ref 70–99)
Potassium: 4.5 mEq/L (ref 3.5–5.1)
SODIUM: 137 meq/L (ref 135–145)

## 2014-06-30 LAB — TSH: TSH: 1.32 u[IU]/mL (ref 0.35–4.50)

## 2014-06-30 LAB — HEMOGLOBIN A1C: Hgb A1c MFr Bld: 7.4 % — ABNORMAL HIGH (ref 4.6–6.5)

## 2014-06-30 MED ORDER — DILTIAZEM HCL ER COATED BEADS 180 MG PO CP24: 180.0000 mg | ORAL_CAPSULE | Freq: Every day | ORAL | Status: DC

## 2014-06-30 MED ORDER — POTASSIUM CHLORIDE CRYS ER 20 MEQ PO TBCR: 20.0000 meq | EXTENDED_RELEASE_TABLET | Freq: Every day | ORAL | Status: DC

## 2014-06-30 MED ORDER — ESTROGENS, CONJUGATED 0.625 MG/GM VA CREA: 1.0000 g | TOPICAL_CREAM | VAGINAL | Status: DC

## 2014-06-30 MED ORDER — FUROSEMIDE 20 MG PO TABS: 20.0000 mg | ORAL_TABLET | Freq: Every day | ORAL | Status: DC

## 2014-06-30 MED ORDER — LEVOTHYROXINE SODIUM 50 MCG PO TABS: 50.0000 ug | ORAL_TABLET | ORAL | Status: DC | PRN

## 2014-06-30 MED ORDER — NITROGLYCERIN 0.4 MG SL SUBL: 0.4000 mg | SUBLINGUAL_TABLET | SUBLINGUAL | Status: DC | PRN

## 2014-06-30 MED ORDER — ATORVASTATIN CALCIUM 80 MG PO TABS: 80.0000 mg | ORAL_TABLET | Freq: Every day | ORAL | Status: DC

## 2014-06-30 NOTE — Progress Notes (Signed)
   Subjective:    Patient ID: Rebecca [name], female    DOB: 11/09/1945, 68 y.o.   MRN: [id number]  HPI [name] is a 68 year old married female nonsmoker retired [employer] scientist who comes in today for evaluation of hyperlipidemia, hypertension, postmenopausal vaginal dryness, insomnia, depression, hypothyroidism,  She's followed in cardiology. She has a history of underlying coronary artery disease and myocardial infarction. Her last evaluation in cardiology was normal.  Her med list was unchanged  She gets routine eye care, dental care, BSE monthly, annual mammography, colonoscopy normal. Vaccinations updated by [name]  Last year she states she spent 3 months in a wheelchair because she had a fractured foot. She declined surgery because when she had it before she developed RSD  Cognitive function normal she walks daily home health safety reviewed no issues identified, no guns in the house, she does have a healthcare power of attorney and living well   Review of Systems  Constitutional: Negative.   HENT: Negative.   Eyes: Negative.   Respiratory: Negative.   Cardiovascular: Negative.   Gastrointestinal: Negative.   Endocrine: Negative.   Genitourinary: Negative.   Musculoskeletal: Negative.   Skin: Negative.   Allergic/Immunologic: Negative.   Neurological: Negative.   Hematological: Negative.   Psychiatric/Behavioral: Negative.        Objective:   Physical Exam  Constitutional: She appears well-developed and well-nourished.  HENT:  Head: Normocephalic and atraumatic.  Right Ear: External ear normal.  Left Ear: External ear normal.  Nose: Nose normal.  Mouth/Throat: Oropharynx is clear and moist.  Eyes: EOM are normal. Pupils are equal, round, and reactive to light.  Neck: Normal range of motion. Neck supple. No JVD present. No tracheal deviation present. No thyromegaly present.  Cardiovascular: Normal rate, regular rhythm, normal heart sounds and intact distal pulses.  Exam reveals  no gallop and no friction rub.   No murmur heard. No carotid nor aortic bruits peripheral pulses 2+ and symmetrical  Pulmonary/Chest: Effort normal and breath sounds normal. No stridor. No respiratory distress. She has no wheezes. She has no rales. She exhibits no tenderness.  Abdominal: Soft. Bowel sounds are normal. She exhibits no distension and no mass. There is no tenderness. There is no rebound and no guarding.  Genitourinary:  Bilateral breast exam normal except for inverted nipples chronic  Musculoskeletal: Normal range of motion.  Lymphadenopathy:    She has no cervical adenopathy.  Neurological: She is alert. She has normal reflexes. No cranial nerve deficit. She exhibits normal muscle tone. Coordination normal.  Skin: Skin is warm and dry. No rash noted. No erythema. No pallor.  Total body skin exam normal  Psychiatric: She has a normal mood and affect. Her behavior is normal. Judgment and thought content normal.  Nursing note and vitals reviewed.         Assessment & Plan:  Hypertension ago continue current therapy  Hyperlipidemia goal continue current therapy  Postmenopausal vaginal dryness continue Premarin cream  Sleep dysfunction continue trazodone 50 mg at bedtime and Ativan when necessary  History of hypothyroidism continue Synthroid  History coronary artery disease refill nitroglycerin cardiac wise asymptomatic.

## 2014-06-30 NOTE — Patient Instructions (Signed)
Continue your current medications  Follow-up in one year for general evaluation sooner if any problems  Continue daily exercise

## 2014-06-30 NOTE — Progress Notes (Signed)
Pre visit review using our clinic review tool, if applicable. No additional management support is needed unless otherwise documented below in the visit note. 

## 2014-07-01 ENCOUNTER — Telehealth: Payer: Self-pay | Admitting: Family Medicine

## 2014-07-01 NOTE — Telephone Encounter (Signed)
emmi emailed °

## 2014-07-06 ENCOUNTER — Telehealth: Payer: Self-pay | Admitting: Family Medicine

## 2014-07-06 DIAGNOSIS — I10 Essential (primary) hypertension: Secondary | ICD-10-CM

## 2014-07-06 DIAGNOSIS — N760 Acute vaginitis: Secondary | ICD-10-CM

## 2014-07-06 DIAGNOSIS — I2510 Atherosclerotic heart disease of native coronary artery without angina pectoris: Secondary | ICD-10-CM

## 2014-07-06 MED ORDER — DILTIAZEM HCL ER COATED BEADS 180 MG PO CP24: 180.0000 mg | ORAL_CAPSULE | Freq: Every day | ORAL | Status: DC

## 2014-07-06 MED ORDER — LEVOTHYROXINE SODIUM 50 MCG PO TABS: 50.0000 ug | ORAL_TABLET | ORAL | Status: DC | PRN

## 2014-07-06 MED ORDER — NITROGLYCERIN 0.4 MG SL SUBL: 0.4000 mg | SUBLINGUAL_TABLET | SUBLINGUAL | Status: DC | PRN

## 2014-07-06 MED ORDER — POTASSIUM CHLORIDE CRYS ER 20 MEQ PO TBCR: 20.0000 meq | EXTENDED_RELEASE_TABLET | Freq: Every day | ORAL | Status: DC

## 2014-07-06 MED ORDER — FUROSEMIDE 20 MG PO TABS: 20.0000 mg | ORAL_TABLET | Freq: Every day | ORAL | Status: DC

## 2014-07-06 MED ORDER — ESTROGENS, CONJUGATED 0.625 MG/GM VA CREA: 1.0000 g | TOPICAL_CREAM | VAGINAL | Status: DC

## 2014-07-06 MED ORDER — ATORVASTATIN CALCIUM 80 MG PO TABS: 80.0000 mg | ORAL_TABLET | Freq: Every day | ORAL | Status: DC

## 2014-07-06 NOTE — Telephone Encounter (Signed)
Pt states all her scripts were done at her [facility] should have been sent to [facility]. They were sent to [facility].   Pt needs all the scripts done at her c[facility] resent to [facility]. atorvastatin (LIPITOR) 80 MG tablet conjugated estrogens (PREMARIN) vaginal cream diltiazem (CARDIZEM CD) 180 MG 24 hr capsule furosemide (LASIX) 20 MG tablet levothyroxine (SYNTHROID, LEVOTHROID) 50 MCG tablet potassium chloride SA (K-DUR,KLOR-CON) 20 MEQ tablet

## 2014-07-06 NOTE — Telephone Encounter (Signed)
Rx sent 

## 2014-09-10 ENCOUNTER — Ambulatory Visit (INDEPENDENT_AMBULATORY_CARE_PROVIDER_SITE_OTHER): Payer: Medicare Other | Admitting: Family Medicine

## 2014-09-10 ENCOUNTER — Encounter: Payer: Self-pay | Admitting: Family Medicine

## 2014-09-10 VITALS — BP 110/80 | HR 80 | Temp 98.2°F | Wt 152.0 lb

## 2014-09-10 DIAGNOSIS — J45901 Unspecified asthma with (acute) exacerbation: Secondary | ICD-10-CM | POA: Insufficient documentation

## 2014-09-10 MED ORDER — BECLOMETHASONE DIPROPIONATE 40 MCG/ACT IN AERS: 2.0000 | INHALATION_SPRAY | Freq: Two times a day (BID) | RESPIRATORY_TRACT | Status: DC

## 2014-09-10 MED ORDER — PREDNISONE 20 MG PO TABS: ORAL_TABLET | ORAL | Status: DC

## 2014-09-10 MED ORDER — ALBUTEROL SULFATE (2.5 MG/3ML) 0.083% IN NEBU: 2.5000 mg | INHALATION_SOLUTION | Freq: Four times a day (QID) | RESPIRATORY_TRACT | Status: DC | PRN

## 2014-09-10 MED ORDER — ALBUTEROL SULFATE HFA 108 (90 BASE) MCG/ACT IN AERS: 2.0000 | INHALATION_SPRAY | Freq: Four times a day (QID) | RESPIRATORY_TRACT | Status: DC | PRN

## 2014-09-10 MED ORDER — HYDROCODONE-HOMATROPINE 5-1.5 MG/5ML PO SYRP: 5.0000 mL | ORAL_SOLUTION | Freq: Three times a day (TID) | ORAL | Status: DC | PRN

## 2014-09-10 NOTE — Progress Notes (Signed)
Pre visit review using our clinic review tool, if applicable. No additional management support is needed unless otherwise documented below in the visit note. 

## 2014-09-10 NOTE — Progress Notes (Signed)
   Subjective:    Patient ID: Rebecca [name], female    DOB: Sep 19, 1945, 69 y.o.   MRN: [id number]  HPI Rebecca [name] is a 69 year old female married nonsmoker who comes in today with a cough for 1 month  She's had a history of reactive airway disease in the past. A month ago she developed a cold: Away but the cough this persisted. No fever no sputum production. She's had this problem in the past. The last was 3 years ago. She had some leftover albuterol and Qvar. She's been trying that for couple weeks and it hasn't helped. Review of systems otherwise negative   Review of Systems     Objective:   Physical Exam  Well-developed well-nourished female no acute distress vital signs stable she's afebrile HEENT were negative neck was supple no adenopathy lungs are clear except for symmetrical late expiratory wheezing      Assessment & Plan:  Reactive airway disease with asthma,,,,,,,,, prednisone burst and taper

## 2014-09-10 NOTE — Patient Instructions (Signed)
Prednisone 20 mg......... 2 tabs now..... 2 tabs tonight......... then 2 tabs every morning starting tomorrow until your airways quiet down then taper as outlin  Nebulizer............ 3 times daily with albuterol  Hydromet 1/2-1 teaspoon 3 times daily  Return when necessary

## 2014-09-15 ENCOUNTER — Telehealth: Payer: Self-pay | Admitting: Family Medicine

## 2014-09-15 MED ORDER — FLUTICASONE PROPIONATE HFA 110 MCG/ACT IN AERO: 2.0000 | INHALATION_SPRAY | Freq: Every day | RESPIRATORY_TRACT | Status: DC

## 2014-09-15 NOTE — Telephone Encounter (Signed)
Pt requesting medication refill for QVAR 40MCG Pres Qty: [place], [place]

## 2014-09-15 NOTE — Telephone Encounter (Signed)
Spoke with patient and she will try Flovent because insurance no longer covers Qvar

## 2014-10-07 ENCOUNTER — Other Ambulatory Visit: Payer: Self-pay | Admitting: Family Medicine

## 2014-10-07 DIAGNOSIS — N6002 Solitary cyst of left breast: Secondary | ICD-10-CM

## 2014-11-06 ENCOUNTER — Other Ambulatory Visit: Payer: Self-pay

## 2014-11-06 ENCOUNTER — Other Ambulatory Visit: Payer: Self-pay | Admitting: Family Medicine

## 2014-11-06 DIAGNOSIS — N6002 Solitary cyst of left breast: Secondary | ICD-10-CM

## 2014-11-09 ENCOUNTER — Ambulatory Visit
Admission: RE | Admit: 2014-11-09 | Discharge: 2014-11-09 | Disposition: A | Discharge status: Home / Self Care | Payer: Medicare Other | Source: Ambulatory Visit | Attending: Family Medicine | Admitting: Family Medicine

## 2014-11-09 ENCOUNTER — Encounter: Payer: Self-pay | Admitting: *Deleted

## 2014-11-09 ENCOUNTER — Other Ambulatory Visit: Payer: Self-pay | Admitting: Family Medicine

## 2014-11-09 DIAGNOSIS — R928 Other abnormal and inconclusive findings on diagnostic imaging of breast: Secondary | ICD-10-CM | POA: Diagnosis not present

## 2014-11-09 DIAGNOSIS — N6002 Solitary cyst of left breast: Secondary | ICD-10-CM

## 2014-11-11 ENCOUNTER — Ambulatory Visit (INDEPENDENT_AMBULATORY_CARE_PROVIDER_SITE_OTHER): Payer: Medicare Other | Admitting: Cardiology

## 2014-11-11 ENCOUNTER — Encounter: Payer: Self-pay | Admitting: Cardiology

## 2014-11-11 VITALS — BP 134/78 | HR 86 | Ht 59.75 in | Wt 154.0 lb

## 2014-11-11 DIAGNOSIS — E669 Obesity, unspecified: Secondary | ICD-10-CM

## 2014-11-11 DIAGNOSIS — E785 Hyperlipidemia, unspecified: Secondary | ICD-10-CM | POA: Diagnosis not present

## 2014-11-11 DIAGNOSIS — E1169 Type 2 diabetes mellitus with other specified complication: Secondary | ICD-10-CM

## 2014-11-11 DIAGNOSIS — I2510 Atherosclerotic heart disease of native coronary artery without angina pectoris: Secondary | ICD-10-CM

## 2014-11-11 DIAGNOSIS — E119 Type 2 diabetes mellitus without complications: Secondary | ICD-10-CM

## 2014-11-11 DIAGNOSIS — I10 Essential (primary) hypertension: Secondary | ICD-10-CM | POA: Diagnosis not present

## 2014-11-11 MED ORDER — METFORMIN HCL 500 MG PO TABS: 500.0000 mg | ORAL_TABLET | Freq: Two times a day (BID) | ORAL | Status: DC

## 2014-11-11 NOTE — Progress Notes (Signed)
Patient ID: Rebecca [name], female   DOB: Jan 11, 1946, 69 y.o.   MRN: [id number]    Patient ID: Rebecca [name], female   DOB: 03/10/1946, 69 y.o.   MRN: [id number]  Patient Name: Rebecca [name] Date of Encounter: 11/11/2014  Primary Care Provider:  Joycelyn Man, MD Primary Cardiologist:  Dorothy Spark  Patient Profile  Re-establish cardiology care  Problem List   Past Medical History  Diagnosis Date  . GERD (gastroesophageal reflux disease)   . MI (myocardial infarction)   . CAD (coronary artery disease)   . Chest pain, atypical   . Hyperlipidemia   . Hypertension   . Fatigue   . Hypothyroidism   . Fatigue   . Edema   . Asthma     extrinsic  . Osteoarthritis   . Keratosis, seborrheic   . Labyrinthitis   . Skin cancer   . IBS (irritable bowel syndrome)     hx   Past Surgical History  Procedure Laterality Date  . Cholecystectomy    . Tubal ligation    . Knee arthroplasty      total right  02-28-2010 Dr Pilar Plate Aluisio, left  . Eus  04/04/2012    Procedure: UPPER ENDOSCOPIC ULTRASOUND (EUS) LINEAR;  Surgeon: Milus Banister, MD;  Location: [facility];  Service: Endoscopy;  Laterality: N/A;  radial linear    Allergies  Allergies  Allergen Reactions  . Sulfa Antibiotics   . Sulfonamide Derivatives     REACTION: Kathreen Cosier syndrome    Chief complain: Follow up for CAD  HPI  69 year old female with h/o CAD, MI in 1992, cath x 3, last time in 2010 with PCI to the RCA. She has been doing well, travelling a lot recently. Denies any chest pain. She has gain about 13 lbs since the last year. She is complaining of lower extremities edema for which she take double dose of lasix with some relief. No palpitations, syncope.  The patient is coming after 18 months, she states that feels great and denies any episodes of chest pain or shortness of breath, spends 6-7 hours a week exercising at [facility] without any symptoms. She denies any palpitations or syncope. She has no  side effects from atorvastatin denies any muscular or joint pain. She is very compliant with her medicines. Only concern is worsening hemoglobin A1c most recent 7.4%.  Home Medications  Prior to Admission medications   Medication Sig Start Date End Date Taking? Authorizing Provider  acetaminophen (TYLENOL) 325 MG tablet Take 650 mg by mouth every 6 (six) hours as needed.      Historical Provider, MD  albuterol (PROVENTIL HFA) 108 (90 BASE) MCG/ACT inhaler Inhale 2 puffs into the lungs every 6 (six) hours as needed for wheezing. 05/16/12   Dorena Cookey, MD  albuterol (PROVENTIL HFA;VENTOLIN HFA) 108 (90 BASE) MCG/ACT inhaler Inhale 2 puffs into the lungs every 6 (six) hours as needed for wheezing.    Historical Provider, MD  albuterol (PROVENTIL) (2.5 MG/3ML) 0.083% nebulizer solution Take 3 mLs (2.5 mg total) by nebulization 4 (four) times daily. 05/16/12   Dorena Cookey, MD  aspirin 325 MG tablet Take 325 mg by mouth daily.      Historical Provider, MD  atorvastatin (LIPITOR) 80 MG tablet Take 1 tablet (80 mg total) by mouth daily. 05/16/12   Dorena Cookey, MD  beclomethasone (QVAR) 80 MCG/ACT inhaler Inhale 2 puffs into the lungs 2 (two) times daily. 05/16/12   Dorena Cookey,  MD  beclomethasone (QVAR) 80 MCG/ACT inhaler Inhale 1 puff into the lungs 2 (two) times daily as needed. Shortness of breath    Historical Provider, MD  calcium carbonate (OS-CAL) 600 MG TABS Take 600 mg by mouth 2 (two) times daily with a meal.      Historical Provider, MD  calcium carbonate (OS-CAL) 600 MG TABS Take 600 mg by mouth daily.    Historical Provider, MD  cetirizine (ZYRTEC) 10 MG tablet Take 10 mg by mouth daily.      Historical Provider, MD  cetirizine (ZYRTEC) 10 MG tablet Take 10 mg by mouth daily as needed for allergies.    Historical Provider, MD  Cholecalciferol (VITAMIN D) 2000 UNITS CAPS Take 1 capsule by mouth daily.      Historical Provider, MD  conjugated estrogens (PREMARIN) vaginal cream  Place 0.27 Applicatorfuls vaginally every 7 (seven) days. weekly 05/16/12   Dorena Cookey, MD  conjugated estrogens (PREMARIN) vaginal cream Place 0.5 g vaginally daily.    Historical Provider, MD  diazepam (VALIUM) 5 MG tablet Take 1 tablet (5 mg total) by mouth every 6 (six) hours as needed for anxiety. 10/17/12   Hoy Morn, MD  diltiazem (CARDIZEM CD) 180 MG 24 hr capsule Take 1 capsule (180 mg total) by mouth daily. 05/16/12   Dorena Cookey, MD  diltiazem (DILACOR XR) 180 MG 24 hr capsule Take 180 mg by mouth daily.    Historical Provider, MD  furosemide (LASIX) 20 MG tablet Take 1 tablet (20 mg total) by mouth 2 (two) times daily as needed. 05/23/12   Dorena Cookey, MD  HYDROcodone-acetaminophen (VICODIN ES) 7.5-750 MG per tablet One half to one tablet 3 times daily when necessary for pain 07/03/12   Dorena Cookey, MD  hyoscyamine (LEVSIN SL) 0.125 MG SL tablet Place 1 tablet (0.125 mg total) under the tongue every 4 (four) hours as needed. 05/16/12   Dorena Cookey, MD  ibuprofen (ADVIL,MOTRIN) 200 MG tablet Take 600 mg by mouth every 6 (six) hours as needed for pain.    Historical Provider, MD  levothyroxine (SYNTHROID, LEVOTHROID) 50 MCG tablet Take 1 tablet (50 mcg total) by mouth daily. 05/16/12   Dorena Cookey, MD  levothyroxine (SYNTHROID, LEVOTHROID) 50 MCG tablet Take 1 tablet (50 mcg total) by mouth daily before breakfast. 03/24/13   Dorena Cookey, MD  Loperamide HCl (IMODIUM A-D PO) Take 1 tablet by mouth as needed.    Historical Provider, MD  Meth-Hyo-M Bl-Benz Acd-Ph Sal (HYOPHEN PO) Take 1 tablet by mouth 2 (two) times daily.    Historical Provider, MD  mometasone (NASONEX) 50 MCG/ACT nasal spray Place 2 sprays into the nose as needed. 05/16/12   Dorena Cookey, MD  mometasone (NASONEX) 50 MCG/ACT nasal spray Place 2 sprays into the nose daily as needed. breathing    Historical Provider, MD  montelukast (SINGULAIR) 10 MG tablet Take 1 tablet (10 mg total) by mouth daily.  05/16/12   Dorena Cookey, MD  montelukast (SINGULAIR) 10 MG tablet Take 10 mg by mouth as needed.    Historical Provider, MD  Naphazoline-Polyethyl Glycol (EYE DROPS) 0.012-0.2 % SOLN Apply to eye. Artifical eye drops     Historical Provider, MD  nitrofurantoin, macrocrystal-monohydrate, (MACROBID) 100 MG capsule Take 100 mg by mouth 2 (two) times daily.    Historical Provider, MD  nitroGLYCERIN (NITROSTAT) 0.4 MG SL tablet Place 1 tablet (0.4 mg total) under the tongue every 5 (five) minutes  as needed. 05/16/12   Dorena Cookey, MD  Omega-3 Fatty Acids (FISH OIL) 1200 MG CAPS Take by mouth.      Historical Provider, MD  omeprazole (PRILOSEC) 20 MG capsule Take 1 capsule (20 mg total) by mouth 2 (two) times daily. 05/16/12   Dorena Cookey, MD  oxyCODONE-acetaminophen (PERCOCET/ROXICET) 5-325 MG per tablet Take 1 tablet by mouth every 4 (four) hours as needed for pain. 10/17/12   Hoy Morn, MD  potassium chloride SA (K-DUR,KLOR-CON) 20 MEQ tablet Take 1 tablet (20 mEq total) by mouth daily. 05/16/12   Dorena Cookey, MD  salicylic acid 6 % gel Apply 1 application topically as needed. For feet    Historical Provider, MD  Salicylic Acid-Cleanser 6 % (CREAM) KIT Apply topically.      Historical Provider, MD    Family History  Family History  Problem Relation Age of Onset  . Melanoma Daughter 72    METASTATIC  . Heart disease Mother   . Breast cancer Sister   . Hypertension Father   . Hypertension Mother   . Hypertension Sister   . Heart attack Mother 7    MULTIPLES OVER TIME    Social History  History   Social History  . Marital Status: Married    Spouse Name: N/A  . Number of Children: 2  . Years of Education: N/A   Occupational History  . RN-retired    Social History Main Topics  . Smoking status: Never Smoker   . Smokeless tobacco: Not on file  . Alcohol Use: No     Comment: rarely  . Drug Use: No  . Sexual Activity: Not on file   Other Topics Concern  . Not on  file   Social History Narrative   ** Merged History Encounter **         Review of Systems General:  No chills, fever, night sweats or weight changes.  Cardiovascular:  No chest pain, dyspnea on exertion,  + lower exetrmities edema, orthopnea, palpitations, paroxysmal nocturnal dyspnea. Dermatological: No rash, lesions/masses Respiratory: No cough, dyspnea Urologic: No hematuria, dysuria Abdominal:   No nausea, vomiting, diarrhea, bright red blood per rectum, melena, or hematemesis Neurologic:  No visual changes, wkns, changes in mental status. All other systems reviewed and are otherwise negative except as noted above.  Physical Exam  Today: 118/64, HR 84 BPM 2 weeks ago BP 145/80, HR 76 BPM General: Pleasant, NAD Psych: Normal affect. Neuro: Alert and oriented X 3. Moves all extremities spontaneously. HEENT: Normal  Neck: Supple without bruits or JVD. Lungs:  Resp regular and unlabored, CTA. Heart: RRR no s3, s4, or murmurs. Abdomen: Soft, non-tender, non-distended, BS + x 4.  Extremities: No clubbing, cyanosis or edema. DP/PT/Radials 2+ and equal bilaterally.  Accessory Clinical Findings  ECG - sinus rhythm, negative T waves in the inferior , that are unchanged from 04/14/2013.  Lipid Panel     Component Value Date/Time   CHOL 151 06/30/2014 1145   TRIG 105.0 06/30/2014 1145   HDL 47.80 06/30/2014 1145   CHOLHDL 3 06/30/2014 1145   VLDL 21.0 06/30/2014 1145   LDLCALC 82 06/30/2014 1145   TTE 04/28/2013  Left ventricle: The cavity size was normal. Wall thickness was normal. The estimated ejection fraction was 60%. Wall motion was normal; there were no regional wall motion abnormalities.  Aortic valve: Structurally normal valve. Cusp separation was normal. Doppler: Transvalvular velocity was within the normal range. There was no stenosis. No  regurgitation.  Aorta: Aortic root: The aortic root was normal in size.  Mitral valve: Structurally normal valve.  Leaflet separation was normal. Doppler: Transvalvular velocity was within the normal range. There was no evidence for stenosis. No regurgitation.  Left atrium: The atrium was normal in size.  Right ventricle: The cavity size was normal. Systolic function was normal.  Pulmonic valve: The valve appears to be grossly normal. Doppler: No significant regurgitation.  Tricuspid valve: Structurally normal valve. Leaflet separation was normal. Doppler: Transvalvular velocity was within the normal range. No regurgitation.  Right atrium: The atrium was normal in size.  Pericardium: There was no pericardial effusion.    Assessment & Plan  69 year old female, 1 year follow up  1. CAD, S/P PCI RCA in 2010, now stable, continue ASA 81 mg QD, atorvastatin 80 mg QD, omega 3 acids, no BB (asthma), she is completely asymptomatic, EKG is unchanged from prior.   2. Hyperlipidemia - well controlled on labs performed in December 2015, continue same regimen  3. Hypertension - controlled   4. New diagnosis of diabetes - HbA1c 7.4%, despite diet and exercise, we'll start metformin 500 mg by mouth twice a day.  5. H/o hypothyroidism on Synthroid, TSH normal  Follow up in 1 year.   Dorothy Spark, MD 11/11/2014, 3:53 PM

## 2014-11-11 NOTE — Patient Instructions (Signed)
Medication Instructions:   START TAKING METFORMIN 500 MG TWICE DAILY      Follow-Up:   Your physician wants you to follow-up in: [place] will receive a reminder letter in the mail two months in advance. If you don't receive a letter, please call our office to schedule the follow-up appointment.

## 2014-11-30 DIAGNOSIS — D225 Melanocytic nevi of trunk: Secondary | ICD-10-CM | POA: Diagnosis not present

## 2014-11-30 DIAGNOSIS — Z85820 Personal history of malignant melanoma of skin: Secondary | ICD-10-CM | POA: Diagnosis not present

## 2014-11-30 DIAGNOSIS — L821 Other seborrheic keratosis: Secondary | ICD-10-CM | POA: Diagnosis not present

## 2014-11-30 DIAGNOSIS — Z86018 Personal history of other benign neoplasm: Secondary | ICD-10-CM | POA: Diagnosis not present

## 2014-11-30 DIAGNOSIS — Z85828 Personal history of other malignant neoplasm of skin: Secondary | ICD-10-CM | POA: Diagnosis not present

## 2014-12-09 ENCOUNTER — Ambulatory Visit (INDEPENDENT_AMBULATORY_CARE_PROVIDER_SITE_OTHER): Payer: Medicare Other | Admitting: Adult Health

## 2014-12-09 ENCOUNTER — Encounter: Payer: Self-pay | Admitting: Adult Health

## 2014-12-09 VITALS — BP 146/80 | Temp 98.2°F | Ht <= 58 in | Wt 148.9 lb

## 2014-12-09 DIAGNOSIS — I2510 Atherosclerotic heart disease of native coronary artery without angina pectoris: Secondary | ICD-10-CM | POA: Diagnosis not present

## 2014-12-09 DIAGNOSIS — Z760 Encounter for issue of repeat prescription: Secondary | ICD-10-CM | POA: Diagnosis not present

## 2014-12-09 DIAGNOSIS — R739 Hyperglycemia, unspecified: Secondary | ICD-10-CM | POA: Diagnosis not present

## 2014-12-09 LAB — BASIC METABOLIC PANEL
BUN: 21 mg/dL (ref 6–23)
CO2: 27 mEq/L (ref 19–32)
Calcium: 10.4 mg/dL (ref 8.4–10.5)
Chloride: 96 mEq/L (ref 96–112)
Creatinine, Ser: 0.88 mg/dL (ref 0.40–1.20)
GFR: 67.39 mL/min (ref >60.00)
Glucose, Bld: 89 mg/dL (ref 70–99)
Potassium: 3.9 mEq/L (ref 3.5–5.1)
Sodium: 133 mEq/L — ABNORMAL LOW (ref 135–145)

## 2014-12-09 LAB — GLUCOSE, POCT (MANUAL RESULT ENTRY): POC GLUCOSE: 103 mg/dL — AB (ref 70–99)

## 2014-12-09 LAB — HEMOGLOBIN A1C: HEMOGLOBIN A1C: 6.7 % — AB (ref 4.6–6.5)

## 2014-12-09 MED ORDER — FUROSEMIDE 20 MG PO TABS: 20.0000 mg | ORAL_TABLET | Freq: Two times a day (BID) | ORAL | Status: DC

## 2014-12-09 MED ORDER — POTASSIUM CHLORIDE CRYS ER 20 MEQ PO TBCR: 20.0000 meq | EXTENDED_RELEASE_TABLET | Freq: Two times a day (BID) | ORAL | Status: DC

## 2014-12-09 MED ORDER — ACCU-CHEK SOFT TOUCH LANCETS MISC: Status: DC

## 2014-12-09 NOTE — Progress Notes (Signed)
Subjective:    Patient ID: Rebecca [name], female    DOB: 27-Mar-1946, 69 y.o.   MRN: [id number]  HPI  Ms. Boltz presents to the office today for concern with her blood sugars. She was noticing that her home glucometer ( which she has had for 10 years) was reading blood sugars in the 140's. She has been exercising daily, losing weight and following a strict diet. Her cardiologist started her on Metformin 2 months ago. In the office today her blood sugar was 103, two hours after eating.   She would like to not take the metformin.   No other complaints.   Review of Systems  Respiratory: Negative for cough, chest tightness and wheezing.   Cardiovascular: Positive for leg swelling. Negative for chest pain and palpitations.  Genitourinary: Negative for dysuria, urgency and difficulty urinating.  Neurological: Negative for dizziness, numbness and headaches.  All other systems reviewed and are negative.  Past Medical History  Diagnosis Date  . GERD (gastroesophageal reflux disease)   . MI (myocardial infarction)   . CAD (coronary artery disease)   . Chest pain, atypical   . Hyperlipidemia   . Hypertension   . Fatigue   . Hypothyroidism   . Fatigue   . Edema   . Asthma     extrinsic  . Osteoarthritis   . Keratosis, seborrheic   . Labyrinthitis   . Skin cancer   . IBS (irritable bowel syndrome)     hx    History   Social History  . Marital Status: Married    Spouse Name: N/A  . Number of Children: 2  . Years of Education: N/A   Occupational History  . RN-retired    Social History Main Topics  . Smoking status: Never Smoker   . Smokeless tobacco: Not on file  . Alcohol Use: No     Comment: rarely  . Drug Use: No  . Sexual Activity: Not on file   Other Topics Concern  . Not on file   Social History Narrative   ** Merged History Encounter **        Past Surgical History  Procedure Laterality Date  . Cholecystectomy    . Tubal ligation    . Knee  arthroplasty      total right  02-28-2010 Dr Pilar Plate Aluisio, left  . Eus  04/04/2012    Procedure: UPPER ENDOSCOPIC ULTRASOUND (EUS) LINEAR;  Surgeon: Milus Banister, MD;  Location: [facility];  Service: Endoscopy;  Laterality: N/A;  radial linear    Family History  Problem Relation Age of Onset  . Melanoma Daughter 77    METASTATIC  . Heart disease Mother   . Breast cancer Sister   . Hypertension Father   . Hypertension Mother   . Hypertension Sister   . Heart attack Mother 57    MULTIPLES OVER TIME    Allergies  Allergen Reactions  . Sulfa Antibiotics   . Sulfonamide Derivatives     REACTION: Kathreen Cosier syndrome    Current Outpatient Prescriptions on File Prior to Visit  Medication Sig Dispense Refill  . acetaminophen (TYLENOL) 325 MG tablet Take 650 mg by mouth every 6 (six) hours as needed.      Marland Kitchen aspirin 325 MG tablet Take 325 mg by mouth daily.      Marland Kitchen atorvastatin (LIPITOR) 80 MG tablet Take 1 tablet (80 mg total) by mouth daily. 100 tablet 3  . calcium carbonate (OS-CAL) 600 MG TABS Take 1,200  mg by mouth daily.     . Cholecalciferol (VITAMIN D) 2000 UNITS CAPS Take 1 capsule by mouth daily.      Marland Kitchen conjugated estrogens (PREMARIN) vaginal cream Place 0.5 Applicatorfuls vaginally every 7 (seven) days. 42.5 g 6  . diltiazem (CARDIZEM CD) 180 MG 24 hr capsule Take 1 capsule (180 mg total) by mouth daily. 100 capsule 3  . ibuprofen (ADVIL,MOTRIN) 200 MG tablet Take 600 mg by mouth every 6 (six) hours as needed for pain.    Marland Kitchen levothyroxine (SYNTHROID, LEVOTHROID) 50 MCG tablet Take 1 tablet (50 mcg total) by mouth as needed. 90 tablet 3  . metFORMIN (GLUCOPHAGE) 500 MG tablet Take 1 tablet (500 mg total) by mouth 2 (two) times daily with a meal. 180 tablet 3  . Naphazoline-Polyethyl Glycol (EYE DROPS) 0.012-0.2 % SOLN Apply to eye as needed. Artifical eye drops    . nitroGLYCERIN (NITROSTAT) 0.4 MG SL tablet Place 1 tablet (0.4 mg total) under the tongue every 5 (five)  minutes as needed. 25 tablet 1  . Omega-3 Fatty Acids (FISH OIL) 1200 MG CAPS Take by mouth.      Marland Kitchen albuterol (PROVENTIL HFA;VENTOLIN HFA) 108 (90 BASE) MCG/ACT inhaler Inhale 2 puffs into the lungs every 6 (six) hours as needed for wheezing or shortness of breath. (Patient not taking: Reported on 12/09/2014) 1 Inhaler 0  . albuterol (PROVENTIL) (2.5 MG/3ML) 0.083% nebulizer solution Take 3 mLs (2.5 mg total) by nebulization every 6 (six) hours as needed for wheezing or shortness of breath. (Patient not taking: Reported on 12/09/2014) 150 mL 1  . cetirizine (ZYRTEC) 10 MG tablet Take 10 mg by mouth daily as needed for allergies.    . fluticasone (FLOVENT HFA) 110 MCG/ACT inhaler Inhale 2 puffs into the lungs daily. (Patient not taking: Reported on 12/09/2014) 1 Inhaler 12  . Loperamide HCl (IMODIUM A-D PO) Take 1 tablet by mouth as needed.    . predniSONE (DELTASONE) 20 MG tablet 2 tabs x 3 days, 1 tab x 3 days, 1/2 tab x 3 days, 1/2 tab M,W,F x 2 weeks (Patient not taking: Reported on 11/11/2014) 40 tablet 1   No current facility-administered medications on file prior to visit.    BP 146/80 mmHg  Temp(Src) 98.2 F (36.8 C) (Oral)  Ht 4\' 10"  (1.473 m)  Wt 148 lb 14.4 oz (67.541 kg)  BMI 31.13 kg/m2       Objective:   Physical Exam  Constitutional: She is oriented to person, place, and time. She appears well-developed and well-nourished. No distress.  Neurological: She is alert and oriented to person, place, and time.  Skin: Skin is warm and dry. No rash noted. No erythema. No pallor.  Psychiatric: She has a normal mood and affect. Her behavior is normal. Thought content normal.  Vitals reviewed.      Assessment & Plan:    1. Hyperglycemia - New monitor given. Patient advised that she can stop metformin and check her blood sugars over the next few days. 80-120 fasting and 120-140 post prandial. If they continue to be high, restart metformin - POC Glucose (CBG) - Hemoglobin I6E -  Basic metabolic panel - POCT Glucose (Device for Home Use) - Will follow up with labs.   2. Medication refill  - furosemide (LASIX) 20 MG tablet; Take 1 tablet (20 mg total) by mouth 2 (two) times daily.  Dispense: 180 tablet; Refill: 3 - potassium chloride SA (K-DUR,KLOR-CON) 20 MEQ tablet; Take 1 tablet (20 mEq total)  by mouth 2 (two) times daily.  Dispense: 180 tablet; Refill: 3

## 2014-12-09 NOTE — Patient Instructions (Signed)
It was great meeting you today!  I will follow up with you regarding your blood work.   Check your blood sugars with your new meter. If they continue to be high then restart the metformin.   Follow up with me to establish care and please feel free to let me know if you need anything.   Enjoy your travels this summer.  Check out [email or web address]

## 2014-12-09 NOTE — Progress Notes (Signed)
Pre visit review using our clinic review tool, if applicable. No additional management support is needed unless otherwise documented below in the visit note. 

## 2014-12-09 NOTE — Addendum Note (Signed)
Addended by: Miles Costain T on: 12/09/2014 04:10 PM   Modules accepted: Orders

## 2014-12-10 ENCOUNTER — Encounter: Payer: Self-pay | Admitting: Adult Health

## 2014-12-19 ENCOUNTER — Encounter: Payer: Self-pay | Admitting: Adult Health

## 2014-12-22 ENCOUNTER — Telehealth: Payer: Self-pay | Admitting: Family Medicine

## 2014-12-22 DIAGNOSIS — E119 Type 2 diabetes mellitus without complications: Secondary | ICD-10-CM

## 2014-12-22 DIAGNOSIS — R739 Hyperglycemia, unspecified: Secondary | ICD-10-CM

## 2014-12-22 MED ORDER — GLUCOSE BLOOD VI STRP: ORAL_STRIP | Status: DC

## 2014-12-22 MED ORDER — ACCU-CHEK SOFT TOUCH LANCETS MISC: Status: DC

## 2014-12-22 NOTE — Telephone Encounter (Signed)
Pt call to say that she need a rx for accu test strips . She received the meter from [place].   Pt said she check her sugar twice a day so she will need enough that will last 30 days. She also said her refills should be for 12 months    Pharmacy  [facility]

## 2014-12-22 NOTE — Telephone Encounter (Signed)
Rx sent to [facility].

## 2014-12-22 NOTE — Addendum Note (Signed)
Addended by: Colleen Can on: 12/22/2014 04:06 PM   Modules accepted: Orders, Medications

## 2014-12-24 ENCOUNTER — Other Ambulatory Visit: Payer: Self-pay

## 2014-12-24 DIAGNOSIS — E138 Other specified diabetes mellitus with unspecified complications: Secondary | ICD-10-CM

## 2014-12-24 MED ORDER — GLUCOSE BLOOD VI STRP: ORAL_STRIP | Status: DC

## 2014-12-24 MED ORDER — ACCU-CHEK FASTCLIX LANCETS MISC: Status: DC

## 2015-01-11 ENCOUNTER — Ambulatory Visit (INDEPENDENT_AMBULATORY_CARE_PROVIDER_SITE_OTHER): Payer: Medicare Other | Admitting: Internal Medicine

## 2015-01-11 ENCOUNTER — Other Ambulatory Visit: Payer: Self-pay

## 2015-01-11 ENCOUNTER — Encounter: Payer: Self-pay | Admitting: Internal Medicine

## 2015-01-11 VITALS — BP 130/70 | HR 75 | Temp 97.6°F | Resp 20 | Ht <= 58 in | Wt 140.0 lb

## 2015-01-11 DIAGNOSIS — I2510 Atherosclerotic heart disease of native coronary artery without angina pectoris: Secondary | ICD-10-CM

## 2015-01-11 DIAGNOSIS — I10 Essential (primary) hypertension: Secondary | ICD-10-CM

## 2015-01-11 DIAGNOSIS — K219 Gastro-esophageal reflux disease without esophagitis: Secondary | ICD-10-CM | POA: Diagnosis not present

## 2015-01-11 DIAGNOSIS — E119 Type 2 diabetes mellitus without complications: Secondary | ICD-10-CM

## 2015-01-11 MED ORDER — ONDANSETRON HCL 4 MG PO TABS: 4.0000 mg | ORAL_TABLET | Freq: Three times a day (TID) | ORAL | Status: DC | PRN

## 2015-01-11 MED ORDER — DIPHENOXYLATE-ATROPINE 2.5-0.025 MG PO TABS: 1.0000 | ORAL_TABLET | Freq: Four times a day (QID) | ORAL | Status: DC | PRN

## 2015-01-11 NOTE — Progress Notes (Signed)
Subjective:    Patient ID: Rebecca [name], female    DOB: 1945/11/26, 69 y.o.   MRN: [id number]  HPI 69 year old who has a history of type 2 diabetes controlled with low-dose metformin.  She has coronary artery disease and GERD. 3 days ago.  She was celebrating her 33 ninth wedding anniversary and ate  out at [employer].  She had a slightly poor appetite at that time.  The following morning, she developed profuse diarrhea associated with crampy lower abdominal pain and nausea without vomiting.  She felt better yesterday but during the night again had profuse diarrhea.  At the present time, she has minimal nausea only.  She has been using Imodium. No recent antibiotic use or pertinent travel history Husband and granddaughter have not been ill  Past Medical History  Diagnosis Date  . GERD (gastroesophageal reflux disease)   . MI (myocardial infarction)   . CAD (coronary artery disease)   . Chest pain, atypical   . Hyperlipidemia   . Hypertension   . Fatigue   . Hypothyroidism   . Fatigue   . Edema   . Asthma     extrinsic  . Osteoarthritis   . Keratosis, seborrheic   . Labyrinthitis   . Skin cancer   . IBS (irritable bowel syndrome)     hx    History   Social History  . Marital Status: Married    Spouse Name: N/A  . Number of Children: 2  . Years of Education: N/A   Occupational History  . RN-retired    Social History Main Topics  . Smoking status: Never Smoker   . Smokeless tobacco: Not on file  . Alcohol Use: No     Comment: rarely  . Drug Use: No  . Sexual Activity: Not on file   Other Topics Concern  . Not on file   Social History Narrative   ** Merged History Encounter **        Past Surgical History  Procedure Laterality Date  . Cholecystectomy    . Tubal ligation    . Knee arthroplasty      total right  02-28-2010 Dr Pilar Plate Aluisio, left  . Eus  04/04/2012    Procedure: UPPER ENDOSCOPIC ULTRASOUND (EUS) LINEAR;  Surgeon: Milus Banister, MD;   Location: [facility];  Service: Endoscopy;  Laterality: N/A;  radial linear    Family History  Problem Relation Age of Onset  . Melanoma Daughter 51    METASTATIC  . Heart disease Mother   . Breast cancer Sister   . Hypertension Father   . Hypertension Mother   . Hypertension Sister   . Heart attack Mother 28    MULTIPLES OVER TIME    Allergies  Allergen Reactions  . Sulfa Antibiotics   . Sulfonamide Derivatives     REACTION: Kathreen Cosier syndrome    Current Outpatient Prescriptions on File Prior to Visit  Medication Sig Dispense Refill  . ACCU-CHEK FASTCLIX LANCETS MISC Test twice daily. 102 each 5  . acetaminophen (TYLENOL) 325 MG tablet Take 650 mg by mouth every 6 (six) hours as needed.      Marland Kitchen albuterol (PROVENTIL HFA;VENTOLIN HFA) 108 (90 BASE) MCG/ACT inhaler Inhale 2 puffs into the lungs every 6 (six) hours as needed for wheezing or shortness of breath. 1 Inhaler 0  . albuterol (PROVENTIL) (2.5 MG/3ML) 0.083% nebulizer solution Take 3 mLs (2.5 mg total) by nebulization every 6 (six) hours as needed for wheezing or  shortness of breath. 150 mL 1  . aspirin 325 MG tablet Take 325 mg by mouth daily.      Marland Kitchen atorvastatin (LIPITOR) 80 MG tablet Take 1 tablet (80 mg total) by mouth daily. 100 tablet 3  . calcium carbonate (OS-CAL) 600 MG TABS Take 1,200 mg by mouth daily.     . cetirizine (ZYRTEC) 10 MG tablet Take 10 mg by mouth daily as needed for allergies.    . Cholecalciferol (VITAMIN D) 2000 UNITS CAPS Take 1 capsule by mouth daily.      Marland Kitchen conjugated estrogens (PREMARIN) vaginal cream Place 0.5 Applicatorfuls vaginally every 7 (seven) days. 42.5 g 6  . diltiazem (CARDIZEM CD) 180 MG 24 hr capsule Take 1 capsule (180 mg total) by mouth daily. 100 capsule 3  . fluticasone (FLOVENT HFA) 110 MCG/ACT inhaler Inhale 2 puffs into the lungs daily. 1 Inhaler 12  . furosemide (LASIX) 20 MG tablet Take 1 tablet (20 mg total) by mouth 2 (two) times daily. 180 tablet 3  . glucose  blood (ACCU-CHEK SMARTVIEW) test strip Test twice daily. 100 each 5  . ibuprofen (ADVIL,MOTRIN) 200 MG tablet Take 600 mg by mouth every 6 (six) hours as needed for pain.    Marland Kitchen levothyroxine (SYNTHROID, LEVOTHROID) 50 MCG tablet Take 1 tablet (50 mcg total) by mouth as needed. 90 tablet 3  . Loperamide HCl (IMODIUM A-D PO) Take 1 tablet by mouth as needed.    . metFORMIN (GLUCOPHAGE) 500 MG tablet Take 1 tablet (500 mg total) by mouth 2 (two) times daily with a meal. 180 tablet 3  . Naphazoline-Polyethyl Glycol (EYE DROPS) 0.012-0.2 % SOLN Apply to eye as needed. Artifical eye drops    . nitroGLYCERIN (NITROSTAT) 0.4 MG SL tablet Place 1 tablet (0.4 mg total) under the tongue every 5 (five) minutes as needed. 25 tablet 1  . Omega-3 Fatty Acids (FISH OIL) 1200 MG CAPS Take by mouth.      . potassium chloride SA (K-DUR,KLOR-CON) 20 MEQ tablet Take 1 tablet (20 mEq total) by mouth 2 (two) times daily. 180 tablet 3  . predniSONE (DELTASONE) 20 MG tablet 2 tabs x 3 days, 1 tab x 3 days, 1/2 tab x 3 days, 1/2 tab M,W,F x 2 weeks (Patient not taking: Reported on 01/11/2015) 40 tablet 1   No current facility-administered medications on file prior to visit.    BP 130/70 mmHg  Pulse 75  Temp(Src) 97.6 F (36.4 C) (Oral)  Resp 20  Ht 4\' 10"  (1.473 m)  Wt 140 lb (63.504 kg)  BMI 29.27 kg/m2  SpO2 98%     Review of Systems  Constitutional: Positive for activity change, appetite change and fatigue.  HENT: Negative for congestion, dental problem, hearing loss, rhinorrhea, sinus pressure, sore throat and tinnitus.   Eyes: Negative for pain, discharge and visual disturbance.  Respiratory: Negative for cough and shortness of breath.   Cardiovascular: Negative for chest pain, palpitations and leg swelling.  Gastrointestinal: Positive for nausea, abdominal pain and diarrhea. Negative for vomiting, constipation, blood in stool and abdominal distention.  Genitourinary: Negative for dysuria, urgency,  frequency, hematuria, flank pain, vaginal bleeding, vaginal discharge, difficulty urinating, vaginal pain and pelvic pain.  Musculoskeletal: Negative for joint swelling, arthralgias and gait problem.  Skin: Negative for rash.  Neurological: Negative for dizziness, syncope, speech difficulty, weakness, numbness and headaches.  Hematological: Negative for adenopathy.  Psychiatric/Behavioral: Negative for behavioral problems, dysphoric mood and agitation. The patient is not nervous/anxious.  Objective:   Physical Exam  Constitutional: She is oriented to person, place, and time. She appears well-developed and well-nourished. No distress.  Blood pressure normal No tachycardia  HENT:  Head: Normocephalic.  Right Ear: External ear normal.  Left Ear: External ear normal.  Mouth/Throat: Oropharynx is clear and moist.  Mucosal membranes appear moist  Eyes: Conjunctivae and EOM are normal. Pupils are equal, round, and reactive to light.  Neck: Normal range of motion. Neck supple. No thyromegaly present.  Cardiovascular: Normal rate, regular rhythm, normal heart sounds and intact distal pulses.   Pulse slow and regular  Pulmonary/Chest: Effort normal and breath sounds normal.  Abdominal: Soft. Bowel sounds are normal. She exhibits no mass. There is tenderness.  Mild diffuse tenderness No guarding Bowel sounds active  Musculoskeletal: Normal range of motion.  Lymphadenopathy:    She has no cervical adenopathy.  Neurological: She is alert and oriented to person, place, and time.  Skin: Skin is warm and dry. No rash noted.  Psychiatric: She has a normal mood and affect. Her behavior is normal.          Assessment & Plan:   Gastroenteritis.  At the present time, patient seems improved.  Will maintain on a low residue diet and advance diet as tolerated.  Will print out prescriptions for both Zofran and Lomotil which she may not need.  Hopefully she is through the worst of the illness.   She will report any clinical worsening Hypertension, stable Type 2 diabetes

## 2015-01-11 NOTE — Patient Instructions (Signed)
Drink as much fluid as you  can tolerate over the next few days; advance diet as tolerated  Viral Gastroenteritis Viral gastroenteritis is also known as stomach flu. This condition affects the stomach and intestinal tract. It can cause sudden diarrhea and vomiting. The illness typically lasts 3 to 8 days. Most people develop an immune response that eventually gets rid of the virus. While this natural response develops, the virus can make you quite ill. CAUSES  Many different viruses can cause gastroenteritis, such as rotavirus or noroviruses. You can catch one of these viruses by consuming contaminated food or water. You may also catch a virus by sharing utensils or other personal items with an infected person or by touching a contaminated surface. SYMPTOMS  The most common symptoms are diarrhea and vomiting. These problems can cause a severe loss of body fluids (dehydration) and a body salt (electrolyte) imbalance. Other symptoms may include:  Fever.  Headache.  Fatigue.  Abdominal pain. DIAGNOSIS  Your caregiver can usually diagnose viral gastroenteritis based on your symptoms and a physical exam. A stool sample may also be taken to test for the presence of viruses or other infections. TREATMENT  This illness typically goes away on its own. Treatments are aimed at rehydration. The most serious cases of viral gastroenteritis involve vomiting so severely that you are not able to keep fluids down. In these cases, fluids must be given through an intravenous line (IV). HOME CARE INSTRUCTIONS   Drink enough fluids to keep your urine clear or pale yellow. Drink small amounts of fluids frequently and increase the amounts as tolerated.  Ask your caregiver for specific rehydration instructions.  Avoid:  Foods high in sugar.  Alcohol.  Carbonated drinks.  Tobacco.  Juice.  Caffeine drinks.  Extremely hot or cold fluids.  Fatty, greasy foods.  Too much intake of anything at one  time.  Dairy products until 24 to 48 hours after diarrhea stops.  You may consume probiotics. Probiotics are active cultures of beneficial bacteria. They may lessen the amount and number of diarrheal stools in adults. Probiotics can be found in yogurt with active cultures and in supplements.  Wash your hands well to avoid spreading the virus.  Only take over-the-counter or prescription medicines for pain, discomfort, or fever as directed by your caregiver. Do not give aspirin to children. Antidiarrheal medicines are not recommended.  Ask your caregiver if you should continue to take your regular prescribed and over-the-counter medicines.  Keep all follow-up appointments as directed by your caregiver. SEEK IMMEDIATE MEDICAL CARE IF:   You are unable to keep fluids down.  You do not urinate at least once every 6 to 8 hours.  You develop shortness of breath.  You notice blood in your stool or vomit. This may look like coffee grounds.  You have abdominal pain that increases or is concentrated in one small area (localized).  You have persistent vomiting or diarrhea.  You have a fever.  The patient is a child younger than 3 months, and he or she has a fever.  The patient is a child older than 3 months, and he or she has a fever and persistent symptoms.  The patient is a child older than 3 months, and he or she has a fever and symptoms suddenly get worse.  The patient is a baby, and he or she has no tears when crying. MAKE SURE YOU:   Understand these instructions.  Will watch your condition.  Will get help right  away if you are not doing well or get worse. Document Released: 07/03/2005 Document Revised: 09/25/2011 Document Reviewed: 04/19/2011 [facility] Patient Information 2015 [place], [place]. This information is not intended to replace advice given to you by your health care provider. Make sure you discuss any questions you have with your health care provider.

## 2015-01-11 NOTE — Progress Notes (Signed)
Pre visit review using our clinic review tool, if applicable. No additional management support is needed unless otherwise documented below in the visit note. 

## 2015-01-14 ENCOUNTER — Telehealth: Payer: Self-pay

## 2015-01-14 DIAGNOSIS — I2510 Atherosclerotic heart disease of native coronary artery without angina pectoris: Secondary | ICD-10-CM

## 2015-01-14 DIAGNOSIS — E119 Type 2 diabetes mellitus without complications: Secondary | ICD-10-CM

## 2015-01-14 MED ORDER — METFORMIN HCL 500 MG PO TABS: 500.0000 mg | ORAL_TABLET | Freq: Two times a day (BID) | ORAL | Status: DC

## 2015-01-14 MED ORDER — RELION ULTRA THIN LANCETS 30G MISC: Status: DC

## 2015-01-14 NOTE — Telephone Encounter (Signed)
Thank you. Will review letter

## 2015-01-14 NOTE — Telephone Encounter (Signed)
Received a letter in the mail from pt with blood sugar readings.  Pt is also requesting a refill of Metformin 500 mg bid to [facility].  Pt request  Accu Check Smartview test strips to [facility] and [facility] Ultra Thin Lancets because the Accu check lancets are too expensive.    Rx for test strips and lancets sent to [facility].  Metfromin sent to [facility].  Letter with bs readings given to [facility] for review.

## 2015-01-20 ENCOUNTER — Encounter: Payer: Self-pay | Admitting: Adult Health

## 2015-01-20 DIAGNOSIS — I2510 Atherosclerotic heart disease of native coronary artery without angina pectoris: Secondary | ICD-10-CM

## 2015-01-20 MED ORDER — METFORMIN HCL 500 MG PO TABS: 500.0000 mg | ORAL_TABLET | Freq: Two times a day (BID) | ORAL | Status: DC

## 2015-01-20 NOTE — Telephone Encounter (Signed)
Rx resent to [facility]. °

## 2015-02-06 ENCOUNTER — Encounter: Payer: Self-pay | Admitting: Adult Health

## 2015-02-11 DIAGNOSIS — H25813 Combined forms of age-related cataract, bilateral: Secondary | ICD-10-CM | POA: Diagnosis not present

## 2015-02-11 DIAGNOSIS — H01004 Unspecified blepharitis left upper eyelid: Secondary | ICD-10-CM | POA: Diagnosis not present

## 2015-02-11 DIAGNOSIS — H01001 Unspecified blepharitis right upper eyelid: Secondary | ICD-10-CM | POA: Diagnosis not present

## 2015-02-11 DIAGNOSIS — E119 Type 2 diabetes mellitus without complications: Secondary | ICD-10-CM | POA: Diagnosis not present

## 2015-02-11 LAB — HM DIABETES EYE EXAM

## 2015-02-15 ENCOUNTER — Encounter: Payer: Self-pay | Admitting: Family Medicine

## 2015-02-16 DIAGNOSIS — H25812 Combined forms of age-related cataract, left eye: Secondary | ICD-10-CM | POA: Diagnosis not present

## 2015-02-16 DIAGNOSIS — H2512 Age-related nuclear cataract, left eye: Secondary | ICD-10-CM | POA: Diagnosis not present

## 2015-02-16 DIAGNOSIS — H25012 Cortical age-related cataract, left eye: Secondary | ICD-10-CM | POA: Diagnosis not present

## 2015-02-23 DIAGNOSIS — H2511 Age-related nuclear cataract, right eye: Secondary | ICD-10-CM | POA: Diagnosis not present

## 2015-02-23 DIAGNOSIS — H25011 Cortical age-related cataract, right eye: Secondary | ICD-10-CM | POA: Diagnosis not present

## 2015-02-23 DIAGNOSIS — H25811 Combined forms of age-related cataract, right eye: Secondary | ICD-10-CM | POA: Diagnosis not present

## 2015-04-23 DIAGNOSIS — Z23 Encounter for immunization: Secondary | ICD-10-CM | POA: Diagnosis not present

## 2015-06-18 ENCOUNTER — Other Ambulatory Visit: Payer: Self-pay | Admitting: Adult Health

## 2015-06-18 MED ORDER — METFORMIN HCL 500 MG PO TABS: ORAL_TABLET | ORAL | Status: DC

## 2015-06-18 NOTE — Telephone Encounter (Signed)
Called and spoke with pt and pt states last time she saw Tommi Rumps her medication was changed from 1 tablet bid to 1/2 tablet bid.

## 2015-06-22 ENCOUNTER — Encounter: Payer: Self-pay | Admitting: Adult Health

## 2015-06-22 ENCOUNTER — Ambulatory Visit (INDEPENDENT_AMBULATORY_CARE_PROVIDER_SITE_OTHER): Payer: Medicare Other | Admitting: Adult Health

## 2015-06-22 VITALS — BP 102/76 | Temp 98.3°F | Ht <= 58 in | Wt 146.0 lb

## 2015-06-22 DIAGNOSIS — Z7189 Other specified counseling: Secondary | ICD-10-CM

## 2015-06-22 DIAGNOSIS — E119 Type 2 diabetes mellitus without complications: Secondary | ICD-10-CM | POA: Diagnosis not present

## 2015-06-22 DIAGNOSIS — Z760 Encounter for issue of repeat prescription: Secondary | ICD-10-CM

## 2015-06-22 DIAGNOSIS — Z780 Asymptomatic menopausal state: Secondary | ICD-10-CM

## 2015-06-22 DIAGNOSIS — I2510 Atherosclerotic heart disease of native coronary artery without angina pectoris: Secondary | ICD-10-CM | POA: Diagnosis not present

## 2015-06-22 DIAGNOSIS — Z7689 Persons encountering health services in other specified circumstances: Secondary | ICD-10-CM

## 2015-06-22 MED ORDER — FUROSEMIDE 20 MG PO TABS: 20.0000 mg | ORAL_TABLET | Freq: Two times a day (BID) | ORAL | Status: DC

## 2015-06-22 MED ORDER — ATORVASTATIN CALCIUM 80 MG PO TABS: 80.0000 mg | ORAL_TABLET | Freq: Every day | ORAL | Status: DC

## 2015-06-22 MED ORDER — DILTIAZEM HCL ER COATED BEADS 180 MG PO CP24: 180.0000 mg | ORAL_CAPSULE | Freq: Every day | ORAL | Status: DC

## 2015-06-22 MED ORDER — METFORMIN HCL ER 500 MG PO TB24: 500.0000 mg | ORAL_TABLET | Freq: Every day | ORAL | Status: DC

## 2015-06-22 MED ORDER — NITROGLYCERIN 0.4 MG SL SUBL: 0.4000 mg | SUBLINGUAL_TABLET | SUBLINGUAL | Status: DC | PRN

## 2015-06-22 MED ORDER — LEVOTHYROXINE SODIUM 50 MCG PO TABS
50.0000 ug | ORAL_TABLET | ORAL | Status: DC | PRN
Start: 2015-06-22 — End: 2015-10-01

## 2015-06-22 MED ORDER — POTASSIUM CHLORIDE CRYS ER 20 MEQ PO TBCR: 20.0000 meq | EXTENDED_RELEASE_TABLET | Freq: Two times a day (BID) | ORAL | Status: DC

## 2015-06-22 NOTE — Progress Notes (Signed)
HPI:  Rebecca [name] is here to establish care. She is a pleasant Caucasian female who  has a past medical history of GERD (gastroesophageal reflux disease); MI (myocardial infarction) ([place]); CAD (coronary artery disease); Chest pain, atypical; Hyperlipidemia; Hypertension; Fatigue; Hypothyroidism; Fatigue; Edema; Asthma; Osteoarthritis; Keratosis, seborrheic; Labyrinthitis; Skin cancer; and IBS (irritable bowel syndrome).  Last PCP and physical: 06/2014  Has the following chronic problems that require follow up and concerns today:  Diabetes Mellitus - She is having issues with metformin as an GI issues. Currently she is only taking 250 mg of metformin twice a day. He is unable to take 500 mg due to upset stomach. Her last A1c was in May 2016 at which time it was 6.7. She endorses checking her blood sugars at home, most readings are in the 100-120 range.   ROS negative for unless reported above: fevers, chills,feeling poorly, unintentional weight loss, hearing or vision loss, chest pain, palpitations, leg claudication, struggling to breath,Not feeling congested in the chest, no orthopenia, no cough,no wheezing, normal appetite, no soft tissue swelling, no hemoptysis, melena, hematochezia, hematuria, falls, loc, si, or thoughts of self harm.   Immunizations: UTD Diet:Eat healthy Exercise: Takes approximately 8 various exercise classes per week. Colonoscopy:2011- normal Dexa: Will schedule Pap Smear: No abnormal  Mammogram: 08/2014  Past Medical History  Diagnosis Date  . GERD (gastroesophageal reflux disease)   . MI (myocardial infarction) ([place])   . CAD (coronary artery disease)   . Chest pain, atypical   . Hyperlipidemia   . Hypertension   . Fatigue   . Hypothyroidism   . Fatigue   . Edema   . Asthma     extrinsic  . Osteoarthritis   . Keratosis, seborrheic   . Labyrinthitis   . Skin cancer   . IBS (irritable bowel syndrome)     hx    Past Surgical History  Procedure  Laterality Date  . Cholecystectomy    . Tubal ligation    . Knee arthroplasty      total right  02-28-2010 Dr Pilar Plate Aluisio, left  . Eus  04/04/2012    Procedure: UPPER ENDOSCOPIC ULTRASOUND (EUS) LINEAR;  Surgeon: Milus Banister, MD;  Location: [facility];  Service: Endoscopy;  Laterality: N/A;  radial linear    Family History  Problem Relation Age of Onset  . Melanoma Daughter 56    METASTATIC  . Heart disease Mother   . Breast cancer Sister   . Hypertension Father   . Hypertension Mother   . Hypertension Sister   . Heart attack Mother 90    MULTIPLES OVER TIME    Social History   Social History  . Marital Status: Married    Spouse Name: N/A  . Number of Children: 2  . Years of Education: N/A   Occupational History  . RN-retired    Social History Main Topics  . Smoking status: Never Smoker   . Smokeless tobacco: None  . Alcohol Use: No     Comment: rarely  . Drug Use: No  . Sexual Activity: Not Asked   Other Topics Concern  . None   Social History Narrative   ** Merged History Encounter **         Current outpatient prescriptions:  .  ACCU-CHEK FASTCLIX LANCETS MISC, Test twice daily., Disp: 102 each, Rfl: 5 .  acetaminophen (TYLENOL) 325 MG tablet, Take 650 mg by mouth every 6 (six) hours as needed.  , Disp: , Rfl:  .  albuterol (PROVENTIL HFA;VENTOLIN HFA) 108 (90 BASE) MCG/ACT inhaler, Inhale 2 puffs into the lungs every 6 (six) hours as needed for wheezing or shortness of breath., Disp: 1 Inhaler, Rfl: 0 .  albuterol (PROVENTIL) (2.5 MG/3ML) 0.083% nebulizer solution, Take 3 mLs (2.5 mg total) by nebulization every 6 (six) hours as needed for wheezing or shortness of breath., Disp: 150 mL, Rfl: 1 .  aspirin 325 MG tablet, Take 325 mg by mouth daily.  , Disp: , Rfl:  .  atorvastatin (LIPITOR) 80 MG tablet, Take 1 tablet (80 mg total) by mouth daily., Disp: 100 tablet, Rfl: 3 .  calcium carbonate (OS-CAL) 600 MG TABS, Take 1,200 mg by mouth daily. , Disp:  , Rfl:  .  cetirizine (ZYRTEC) 10 MG tablet, Take 10 mg by mouth daily as needed for allergies., Disp: , Rfl:  .  Cholecalciferol (VITAMIN D) 2000 UNITS CAPS, Take 1 capsule by mouth daily.  , Disp: , Rfl:  .  conjugated estrogens (PREMARIN) vaginal cream, Place 0.5 Applicatorfuls vaginally every 7 (seven) days., Disp: 42.5 g, Rfl: 6 .  diltiazem (CARDIZEM CD) 180 MG 24 hr capsule, Take 1 capsule (180 mg total) by mouth daily., Disp: 100 capsule, Rfl: 3 .  diphenoxylate-atropine (LOMOTIL) 2.5-0.025 MG per tablet, Take 1 tablet by mouth 4 (four) times daily as needed for diarrhea or loose stools., Disp: 30 tablet, Rfl: 0 .  fluticasone (FLOVENT HFA) 110 MCG/ACT inhaler, Inhale 2 puffs into the lungs daily., Disp: 1 Inhaler, Rfl: 12 .  furosemide (LASIX) 20 MG tablet, Take 1 tablet (20 mg total) by mouth 2 (two) times daily., Disp: 180 tablet, Rfl: 3 .  glucose blood (ACCU-CHEK SMARTVIEW) test strip, Test twice daily., Disp: 100 each, Rfl: 5 .  ibuprofen (ADVIL,MOTRIN) 200 MG tablet, Take 600 mg by mouth every 6 (six) hours as needed for pain., Disp: , Rfl:  .  levothyroxine (SYNTHROID, LEVOTHROID) 50 MCG tablet, Take 1 tablet (50 mcg total) by mouth as needed., Disp: 90 tablet, Rfl: 3 .  Loperamide HCl (IMODIUM A-D PO), Take 1 tablet by mouth as needed., Disp: , Rfl:  .  Naphazoline-Polyethyl Glycol (EYE DROPS) 0.012-0.2 % SOLN, Apply to eye as needed. Artifical eye drops, Disp: , Rfl:  .  nitroGLYCERIN (NITROSTAT) 0.4 MG SL tablet, Place 1 tablet (0.4 mg total) under the tongue every 5 (five) minutes as needed., Disp: 25 tablet, Rfl: 1 .  Omega-3 Fatty Acids (FISH OIL) 1200 MG CAPS, Take by mouth.  , Disp: , Rfl:  .  potassium chloride SA (K-DUR,KLOR-CON) 20 MEQ tablet, Take 1 tablet (20 mEq total) by mouth 2 (two) times daily., Disp: 180 tablet, Rfl: 3 .  metFORMIN (GLUCOPHAGE) 500 MG tablet, Take 1 tablet (500 mg total) by mouth 2 (two) times daily with a meal., Disp: 180 tablet, Rfl: 1 .   ondansetron (ZOFRAN) 4 MG tablet, Take 1 tablet (4 mg total) by mouth every 8 (eight) hours as needed for nausea or vomiting. (Patient not taking: Reported on 06/22/2015), Disp: 20 tablet, Rfl: 0  EXAM:  Filed Vitals:   06/22/15 1424  BP: 102/76  Temp: 98.3 F (36.8 C)    Body mass index is 30.52 kg/(m^2).  GENERAL: vitals reviewed and listed above, alert, oriented, appears well hydrated and in no acute distress  HEENT: atraumatic, conjunttiva clear, no obvious abnormalities on inspection of external nose and ears  NECK: Neck is soft and supple without masses, no adenopathy or thyromegaly, trachea midline, no JVD. Normal range  of motion.   LUNGS: clear to auscultation bilaterally, no wheezes, rales or rhonchi, good air movement  CV: Regular rate and rhythm, normal S1/S2, no audible murmurs, gallops, or rubs. No carotid bruit and no peripheral edema.   MS: moves all extremities without noticeable abnormality. No edema noted  Abd: soft/nontender/nondistended/normal bowel sounds   Skin: warm and dry, no rash   Extremities: No clubbing, cyanosis, or edema. Capillary refill is WNL. Pulses intact bilaterally in upper and lower extremities.   Neuro: CN II-XII intact, sensation and reflexes normal throughout, 5/5 muscle strength in bilateral upper and lower extremities. Normal finger to nose. Normal rapid alternating movements. Normal romberg. No pronator drift.   PSYCH: pleasant and cooperative, no obvious depression or anxiety  ASSESSMENT AND PLAN:  1. Atherosclerosis of native coronary artery of native heart without angina pectoris - diltiazem (CARDIZEM CD) 180 MG 24 hr capsule; Take 1 capsule (180 mg total) by mouth daily.  Dispense: 100 capsule; Refill: 2 - atorvastatin (LIPITOR) 80 MG tablet; Take 1 tablet (80 mg total) by mouth daily.  Dispense: 100 tablet; Refill: 2 - nitroGLYCERIN (NITROSTAT) 0.4 MG SL tablet; Place 1 tablet (0.4 mg total) under the tongue every 5 (five)  minutes as needed.  Dispense: 25 tablet; Refill: 3  2. Medication refill - furosemide (LASIX) 20 MG tablet; Take 1 tablet (20 mg total) by mouth 2 (two) times daily.  Dispense: 180 tablet; Refill: 2 - potassium chloride SA (K-DUR,KLOR-CON) 20 MEQ tablet; Take 1 tablet (20 mEq total) by mouth 2 (two) times daily.  Dispense: 180 tablet; Refill: 2  3. Postmenopausal - DG Bone Density; Future  4. Diabetes mellitus without complication (HCC) -Discontinue metformin 500 mg - metFORMIN (GLUCOPHAGE XR) 500 MG 24 hr tablet; Take 1 tablet (500 mg total) by mouth daily with breakfast.  Dispense: 90 tablet; Refill: 2 -Continue to monitor blood sugars on a daily basis 5. Encounter to establish care -Follow-up in January for complete physical exam -Follow-up sooner if needed -Continue to stay active and eat a healthy diet    Discussed the following assessment and plan:  -We reviewed the PMH, PSH, FH, SH, Meds and Allergies. -We provided refills for any medications we will prescribe as needed. -We addressed current concerns per orders and patient instructions. -We have asked for records for pertinent exams, studies, vaccines and notes from previous providers. -We have advised patient to follow up per instructions below.   -Patient advised to return or notify a provider immediately if symptoms worsen or persist or new concerns arise.    [employer]

## 2015-06-22 NOTE — Progress Notes (Signed)
Pre visit review using our clinic review tool, if applicable. No additional management support is needed unless otherwise documented below in the visit note. 

## 2015-06-22 NOTE — Patient Instructions (Signed)
It was great seeing you again!   All of your prescriptions will be sent in.   Let me know if the Metformin XR does not work.   I will see you in January. Enjoy your holiday season!

## 2015-06-23 DIAGNOSIS — M7741 Metatarsalgia, right foot: Secondary | ICD-10-CM | POA: Diagnosis not present

## 2015-06-23 DIAGNOSIS — M79672 Pain in left foot: Secondary | ICD-10-CM | POA: Diagnosis not present

## 2015-06-23 DIAGNOSIS — G5761 Lesion of plantar nerve, right lower limb: Secondary | ICD-10-CM | POA: Diagnosis not present

## 2015-07-14 ENCOUNTER — Encounter: Payer: Self-pay | Admitting: Adult Health

## 2015-07-15 ENCOUNTER — Encounter: Payer: Self-pay | Admitting: *Deleted

## 2015-07-21 ENCOUNTER — Encounter: Payer: Self-pay | Admitting: Adult Health

## 2015-07-21 DIAGNOSIS — M7741 Metatarsalgia, right foot: Secondary | ICD-10-CM | POA: Diagnosis not present

## 2015-07-21 DIAGNOSIS — G5762 Lesion of plantar nerve, left lower limb: Secondary | ICD-10-CM | POA: Diagnosis not present

## 2015-07-21 DIAGNOSIS — G5761 Lesion of plantar nerve, right lower limb: Secondary | ICD-10-CM | POA: Diagnosis not present

## 2015-07-21 DIAGNOSIS — M79672 Pain in left foot: Secondary | ICD-10-CM | POA: Diagnosis not present

## 2015-07-22 NOTE — Telephone Encounter (Signed)
Left a message for pt to return call.  Message was sent to Dr. Sarajane Jews to review.  Advised pt that cortisone can cause her blood sugars to increase but it will come back down. Pt should continue her medication as prescribed and continue to monitor her blood sugars.  Please call the office if blood sugars are continously above 200.

## 2015-07-22 NOTE — Telephone Encounter (Signed)
Pls advise.  

## 2015-07-23 NOTE — Telephone Encounter (Signed)
I agree. Drink plenty of water. She can exercise if se feels up to it

## 2015-07-29 ENCOUNTER — Encounter: Payer: Self-pay | Admitting: Adult Health

## 2015-08-02 ENCOUNTER — Encounter: Payer: Self-pay | Admitting: Adult Health

## 2015-08-02 ENCOUNTER — Ambulatory Visit (INDEPENDENT_AMBULATORY_CARE_PROVIDER_SITE_OTHER): Payer: Medicare Other | Admitting: Adult Health

## 2015-08-02 VITALS — BP 128/72 | HR 105 | Temp 98.5°F | Ht <= 58 in | Wt 143.5 lb

## 2015-08-02 DIAGNOSIS — R05 Cough: Secondary | ICD-10-CM | POA: Diagnosis not present

## 2015-08-02 DIAGNOSIS — J069 Acute upper respiratory infection, unspecified: Secondary | ICD-10-CM

## 2015-08-02 DIAGNOSIS — E119 Type 2 diabetes mellitus without complications: Secondary | ICD-10-CM | POA: Diagnosis not present

## 2015-08-02 DIAGNOSIS — R059 Cough, unspecified: Secondary | ICD-10-CM

## 2015-08-02 MED ORDER — DOXYCYCLINE HYCLATE 100 MG PO CAPS: 100.0000 mg | ORAL_CAPSULE | Freq: Two times a day (BID) | ORAL | Status: DC

## 2015-08-02 MED ORDER — GLIPIZIDE 5 MG PO TABS: 5.0000 mg | ORAL_TABLET | Freq: Two times a day (BID) | ORAL | Status: DC

## 2015-08-02 MED ORDER — HYDROCODONE-HOMATROPINE 5-1.5 MG/5ML PO SYRP: 5.0000 mL | ORAL_SOLUTION | Freq: Three times a day (TID) | ORAL | Status: DC | PRN

## 2015-08-02 NOTE — Patient Instructions (Signed)
It was great seeing you today!  Stop taking the Metformin and start taking glipizide. Continue to monitor your blood sugars at home.   Drinks lots of fluid and add Mucinex DM to your routine for the cough. I have added a cough medication you can take as needed during the evening - it will make you sleepy.   Follow up if no improvement.

## 2015-08-02 NOTE — Progress Notes (Signed)
Subjective:    Patient ID: Rebecca [name], female    DOB: July 09, 1946, 70 y.o.   MRN: [id number]  HPI  70 year old female who presents to the office today for 2 months of muscle aches. She thought that it was originally from her exercises classes, but the muscle aches never went away. She then thought that it was low potassium so she started taking an extra dose of her potassium supplement, the muscle aches continued. About 10 days ago she started having " really bad muscle spasms."  At this time she was taking 524m metformin XR BID ( she was prescribed 5044mXR daily). She stopped taking the metformin three days ago and went on a low carb diet and has had two minor spasms in her upper arms. The muscle aches she still has but they are becoming more tolerable. Currently her pain level is a 3/10 and the aches are located in lower extremities and upper back. Her blood sugars have been in the 100 -130's.   Addiitonally, over the past three days she has had a productive cough with thick mucus, chest discomfort, rhinorrhea and post nasal drip. She has been using an OTC allergy medication and Flonase. She denies any sinus pain and pressure, fever, nausea, vomiting, diarrhea.    Review of Systems  Constitutional: Positive for activity change and fatigue. Negative for fever, chills and appetite change.  HENT: Positive for congestion and postnasal drip. Negative for rhinorrhea and sinus pressure.   Respiratory: Positive for cough. Negative for shortness of breath and wheezing.   Cardiovascular: Negative.   Gastrointestinal: Negative.   Musculoskeletal: Positive for myalgias and back pain. Negative for arthralgias, gait problem, neck pain and neck stiffness.  Neurological: Negative.   Psychiatric/Behavioral: Negative.    Past Medical History  Diagnosis Date  . GERD (gastroesophageal reflux disease)   . [place]    [place] 70. CAD (coronary artery disease)   . Chest pain,  atypical   . Hyperlipidemia   . Hypertension     familiar  . Fatigue   . Hypothyroidism   . Fatigue   . Edema   . Asthma     extrinsic  . Osteoarthritis   . Keratosis, seborrheic   . Labyrinthitis   . Skin cancer     Melanoma   . IBS (irritable bowel syndrome)     hx    Social History   Social History  . Marital Status: Married    Spouse Name: N/A  . Number of Children: 2  . Years of Education: N/A   Occupational History  . RN-retired    Social History Main Topics  . Smoking status: Never Smoker   . Smokeless tobacco: Not on file  . Alcohol Use: No     Comment: rarely  . Drug Use: No  . Sexual Activity: Not on file   Other Topics Concern  . Not on file   Social History Narrative   ** Merged History Encounter **        Past Surgical History  Procedure Laterality Date  . Cholecystectomy    . Tubal ligation    . Knee arthroplasty      total right  02-28-2010 Dr FrPilar Plateluisio, left  . Eus  04/04/2012    Procedure: UPPER ENDOSCOPIC ULTRASOUND (EUS) LINEAR;  Surgeon: DaMilus BanisterMD;  Location: [facility];  Service: Endoscopy;  Laterality: N/A;  radial linear  . Breast biopsy  dense breast tissue    Family History  Problem Relation Age of Onset  . Melanoma Daughter 44    METASTATIC  . Heart disease Mother   . Breast cancer Sister   . Hypertension Father   . Hypertension Mother   . Hypertension Sister   . Heart attack Mother 55    MULTIPLES OVER TIME    Allergies  Allergen Reactions  . Metformin And Related Other (See Comments)    Muscle spasms/soreness   . Sulfa Antibiotics   . Sulfonamide Derivatives     REACTION: Kathreen Cosier syndrome    Current Outpatient Prescriptions on File Prior to Visit  Medication Sig Dispense Refill  . ACCU-CHEK FASTCLIX LANCETS MISC Test twice daily. 102 each 5  . acetaminophen (TYLENOL) 325 MG tablet Take 650 mg by mouth every 6 (six) hours as needed.      Marland Kitchen albuterol (PROVENTIL HFA;VENTOLIN HFA) 108  (90 BASE) MCG/ACT inhaler Inhale 2 puffs into the lungs every 6 (six) hours as needed for wheezing or shortness of breath. 1 Inhaler 0  . albuterol (PROVENTIL) (2.5 MG/3ML) 0.083% nebulizer solution Take 3 mLs (2.5 mg total) by nebulization every 6 (six) hours as needed for wheezing or shortness of breath. 150 mL 1  . aspirin 325 MG tablet Take 325 mg by mouth daily.      Marland Kitchen atorvastatin (LIPITOR) 80 MG tablet Take 1 tablet (80 mg total) by mouth daily. 100 tablet 2  . calcium carbonate (OS-CAL) 600 MG TABS Take 1,200 mg by mouth daily.     . cetirizine (ZYRTEC) 10 MG tablet Take 10 mg by mouth daily as needed for allergies.    . Cholecalciferol (VITAMIN D) 2000 UNITS CAPS Take 1 capsule by mouth daily.      Marland Kitchen conjugated estrogens (PREMARIN) vaginal cream Place 0.5 Applicatorfuls vaginally every 7 (seven) days. 42.5 g 6  . diltiazem (CARDIZEM CD) 180 MG 24 hr capsule Take 1 capsule (180 mg total) by mouth daily. 100 capsule 2  . diphenoxylate-atropine (LOMOTIL) 2.5-0.025 MG per tablet Take 1 tablet by mouth 4 (four) times daily as needed for diarrhea or loose stools. 30 tablet 0  . fluticasone (FLOVENT HFA) 110 MCG/ACT inhaler Inhale 2 puffs into the lungs daily. 1 Inhaler 12  . furosemide (LASIX) 20 MG tablet Take 1 tablet (20 mg total) by mouth 2 (two) times daily. 180 tablet 2  . glucose blood (ACCU-CHEK SMARTVIEW) test strip Test twice daily. 100 each 5  . ibuprofen (ADVIL,MOTRIN) 200 MG tablet Take 600 mg by mouth every 6 (six) hours as needed for pain.    Marland Kitchen levothyroxine (SYNTHROID, LEVOTHROID) 50 MCG tablet Take 1 tablet (50 mcg total) by mouth as needed. 90 tablet 2  . Loperamide HCl (IMODIUM A-D PO) Take 1 tablet by mouth as needed.    . Naphazoline-Polyethyl Glycol (EYE DROPS) 0.012-0.2 % SOLN Apply to eye as needed. Artifical eye drops    . nitroGLYCERIN (NITROSTAT) 0.4 MG SL tablet Place 1 tablet (0.4 mg total) under the tongue every 5 (five) minutes as needed. 25 tablet 3  . Omega-3  Fatty Acids (FISH OIL) 1200 MG CAPS Take by mouth.      . ondansetron (ZOFRAN) 4 MG tablet Take 1 tablet (4 mg total) by mouth every 8 (eight) hours as needed for nausea or vomiting. 20 tablet 0  . potassium chloride SA (K-DUR,KLOR-CON) 20 MEQ tablet Take 1 tablet (20 mEq total) by mouth 2 (two) times daily. 180 tablet 2  No current facility-administered medications on file prior to visit.    BP 128/72 mmHg  Pulse 105  Temp(Src) 98.5 F (36.9 C) (Oral)  Ht _0  (1.473 m)  Wt 143 lb 8 oz (65.091 kg)  BMI 30.00 kg/m2  SpO2 97%       Objective:   Physical Exam  Constitutional: She is oriented to person, place, and time. She appears well-developed and well-nourished. No distress.  Neck: Normal range of motion. Neck supple.  Cardiovascular: Normal rate, regular rhythm, normal heart sounds and intact distal pulses.  Exam reveals no gallop and no friction rub.   No murmur heard. Pulmonary/Chest: Effort normal and breath sounds normal. No respiratory distress. She has no wheezes. She has no rales. She exhibits no tenderness.  Musculoskeletal: Normal range of motion. She exhibits no edema or tenderness.  Lymphadenopathy:    She has no cervical adenopathy.  Neurological: She is alert and oriented to person, place, and time.  Skin: Skin is warm and dry. No rash noted. She is not diaphoretic. No erythema. No pallor.  Psychiatric: She has a normal mood and affect. Her behavior is normal. Judgment and thought content normal.  Nursing note and vitals reviewed.      Assessment & Plan:  1. Diabetes mellitus without complication ([place]) -Consider that her muscle aches/spasms were related to metformin use, possibly lactic acidosis. Due to discontinuing metformin 3 days ago, I do not think a lactic acid level or labs for an anion gap would be conducive at this point in time. We considered going back to her prescribed dose of 500 mg extended release metformin but she would not like to do this due  to her muscle aches and spasms finally starting to resolve.  He is concerned that her blood sugars are getting continued to rise and she is unable to sustain carb free diet. I doubt this is a statin induced myopathy as she's been taking Lipitor for numerous years at this point.  - Hemoglobin A1c - BMP with eGFR - Magnesium - glipiZIDE (GLUCOTROL) 5 MG tablet; Take 1 tablet (5 mg total) by mouth 2 (two) times daily before a meal.  Dispense: 60 tablet; Refill: 3 - Follow up with patient once labs have resulted 2. URI (upper respiratory infection)  - doxycycline (VIBRAMYCIN) 100 MG capsule; Take 1 capsule (100 mg total) by mouth 2 (two) times daily.  Dispense: 14 capsule; Refill: 0. We'll give her a prescription for doxycycline due to her leaving for out of town in 3 days. She will hold off on filling prescription.   3. Cough - HYDROcodone-homatropine (HYCODAN) 5-1.5 MG/5ML syrup; Take 5 mLs by mouth every 8 (eight) hours as needed for cough.  Dispense: 120 mL; Refill: 0

## 2015-08-02 NOTE — Telephone Encounter (Signed)
Pt came into the office for a visit on 1.16.2017.

## 2015-08-03 ENCOUNTER — Telehealth: Payer: Self-pay | Admitting: Adult Health

## 2015-08-03 DIAGNOSIS — E119 Type 2 diabetes mellitus without complications: Secondary | ICD-10-CM

## 2015-08-03 LAB — BASIC METABOLIC PANEL WITH GFR
BUN: 18 mg/dL (ref 7–25)
CO2: 24 mmol/L (ref 20–31)
Calcium: 9.3 mg/dL (ref 8.6–10.4)
Chloride: 97 mmol/L — ABNORMAL LOW (ref 98–110)
Creat: 0.86 mg/dL (ref 0.60–0.93)
GFR, EST AFRICAN AMERICAN: 79 mL/min (ref >=60)
GFR, EST NON AFRICAN AMERICAN: 68 mL/min (ref >=60)
GLUCOSE: 105 mg/dL — AB (ref 65–99)
POTASSIUM: 4.4 mmol/L (ref 3.5–5.3)
Sodium: 131 mmol/L — ABNORMAL LOW (ref 135–146)

## 2015-08-03 LAB — HEMOGLOBIN A1C: Hgb A1c MFr Bld: 7 % — ABNORMAL HIGH (ref 4.6–6.5)

## 2015-08-03 LAB — MAGNESIUM: Magnesium: 2 mg/dL (ref 1.5–2.5)

## 2015-08-03 MED ORDER — GLIPIZIDE 5 MG PO TABS: 5.0000 mg | ORAL_TABLET | Freq: Two times a day (BID) | ORAL | Status: DC

## 2015-08-03 NOTE — Telephone Encounter (Signed)
Pt request refill of the following: glipiZIDE (GLUCOTROL) 5 MG tablet   The above rx went to [facility] pt need this to be sent to the below pharmacy   Phamacy: [place]

## 2015-08-03 NOTE — Telephone Encounter (Signed)
Rx sent to [facility]

## 2015-08-12 DIAGNOSIS — B353 Tinea pedis: Secondary | ICD-10-CM | POA: Diagnosis not present

## 2015-08-12 DIAGNOSIS — G5761 Lesion of plantar nerve, right lower limb: Secondary | ICD-10-CM | POA: Diagnosis not present

## 2015-08-12 DIAGNOSIS — G5762 Lesion of plantar nerve, left lower limb: Secondary | ICD-10-CM | POA: Diagnosis not present

## 2015-08-30 ENCOUNTER — Ambulatory Visit (INDEPENDENT_AMBULATORY_CARE_PROVIDER_SITE_OTHER): Payer: Medicare Other | Admitting: Adult Health

## 2015-08-30 ENCOUNTER — Encounter: Payer: Self-pay | Admitting: Adult Health

## 2015-08-30 VITALS — BP 124/70 | Temp 98.6°F | Ht <= 58 in | Wt 146.7 lb

## 2015-08-30 DIAGNOSIS — E138 Other specified diabetes mellitus with unspecified complications: Secondary | ICD-10-CM | POA: Diagnosis not present

## 2015-08-30 DIAGNOSIS — R252 Cramp and spasm: Secondary | ICD-10-CM

## 2015-08-30 DIAGNOSIS — R600 Localized edema: Secondary | ICD-10-CM

## 2015-08-30 LAB — BASIC METABOLIC PANEL
BUN: 19 mg/dL (ref 6–23)
CHLORIDE: 104 meq/L (ref 96–112)
CO2: 24 mEq/L (ref 19–32)
CREATININE: 0.84 mg/dL (ref 0.40–1.20)
Calcium: 10.5 mg/dL (ref 8.4–10.5)
GFR: 70.97 mL/min (ref >60.00)
Glucose, Bld: 128 mg/dL — ABNORMAL HIGH (ref 70–99)
Potassium: 5.1 mEq/L (ref 3.5–5.1)
Sodium: 142 mEq/L (ref 135–145)

## 2015-08-30 MED ORDER — TIZANIDINE HCL 4 MG PO TABS: 4.0000 mg | ORAL_TABLET | Freq: Four times a day (QID) | ORAL | Status: DC | PRN

## 2015-08-30 MED ORDER — GLUCOSE BLOOD VI STRP: ORAL_STRIP | Status: DC

## 2015-08-30 MED ORDER — ACCU-CHEK FASTCLIX LANCETS MISC: Status: DC

## 2015-08-30 NOTE — Patient Instructions (Addendum)
It was great seeing you again!  Stop glipizide and continue to monitor your blood sugars. If they are going above 140-150 on a constant basis, please let me know.   Start taking CoQ10  I will follow up with you about your labs.   Follow up with me in 2 weeks.

## 2015-08-30 NOTE — Progress Notes (Signed)
Pre visit review using our clinic review tool, if applicable. No additional management support is needed unless otherwise documented below in the visit note. 

## 2015-08-30 NOTE — Progress Notes (Signed)
   Subjective:    Patient ID: Rebecca [name], female    DOB: 1945/08/30, 70 y.o.   MRN: [id number]  HPI   70 year old female who presents to the office with continues leg cramping. I last saw her on 08/02/2015 for bilateral leg cramping after starting metformin. Once she stopped taking Metformin her leg cramps resolved. We started her on Glipizide 5 mg. She reports that she has had trouble maintaining good blood sugar control while taking Metformin and often her blood sugars will drop into the 60's and she will have to eat or drink something.   5 days ago she started to notice that she was having mild cramping in her legs again. Over the last two days the cramping becomes worse, especially at night. She has been staying hydrated, taking her potassium supplements and stretching. Has also started taking Robaxin and feels as though this has helped her get some relief.   She reports that her legs have also started to swell more. She has increased Lasix to 40 mg.    Review of Systems  Respiratory: Negative.   Cardiovascular: Positive for leg swelling.  Gastrointestinal: Negative.   Musculoskeletal: Positive for myalgias and arthralgias. Negative for back pain, joint swelling, gait problem, neck pain and neck stiffness.  Skin: Negative.   Neurological: Negative.   Hematological: Negative.   Psychiatric/Behavioral: Negative.   All other systems reviewed and are negative.      Objective:   Physical Exam  Constitutional: She is oriented to person, place, and time. She appears well-developed and well-nourished. No distress.  Cardiovascular: Normal rate, regular rhythm, normal heart sounds and intact distal pulses.  Exam reveals no gallop and no friction rub.   No murmur heard. Pulmonary/Chest: Effort normal and breath sounds normal. No respiratory distress. She has no wheezes. She has no rales. She exhibits no tenderness.  Musculoskeletal: Normal range of motion. She exhibits edema and tenderness.   Worsening pitting edema in blower extremities. No redness, warmth or pain around calf. She does have pain with palpation to bilateral ankles.   Neurological: She is alert and oriented to person, place, and time. She displays normal reflexes. No cranial nerve deficit. She exhibits normal muscle tone. Coordination normal.  Skin: Skin is warm and dry. No rash noted. She is not diaphoretic. No erythema. No pallor.  Psychiatric: She has a normal mood and affect. Her behavior is normal. Judgment and thought content normal.  Nursing note and vitals reviewed.     Assessment & Plan:  1. Muscle cramps - Unlikely due to glipizide but I will have her discontinue glipizide and see if that helps. I do not think this is a neuropathy. Labs in January showed normal electrolyte levels - Lactic Acid, Plasma - tiZANidine (ZANAFLEX) 4 MG tablet; Take 1 tablet (4 mg total) by mouth every 6 (six) hours as needed for muscle spasms.  Dispense: 30 tablet; Refill: 3 - CK Total and CKMB - Basic metabolic panel  2. Other specified diabetes mellitus with unspecified complications ([place]) - Test blood sugars three times a day  - D/c glipizide.  - Follow diabetic diet - Follow up if blood glucose above 150   3. Bilateral lower extremity edema - Last Echo was in 2014 and EF was 60%. She has an upcoming appointment with cardiology.  - Low sodium diet.  - Basic metabolic panel - Compression socks.

## 2015-08-30 NOTE — Addendum Note (Signed)
Addended by: Colleen Can on: 08/30/2015 04:17 PM   Modules accepted: Orders

## 2015-08-31 LAB — CK TOTAL AND CKMB (NOT AT ARMC)
CK, MB: 0.9 ng/mL (ref 0.0–5.0)
RELATIVE INDEX: 1.8 (ref 0.0–4.0)
Total CK: 50 U/L (ref 7–177)

## 2015-09-02 LAB — LACTIC ACID, PLASMA

## 2015-09-09 DIAGNOSIS — G5762 Lesion of plantar nerve, left lower limb: Secondary | ICD-10-CM | POA: Diagnosis not present

## 2015-09-09 DIAGNOSIS — B353 Tinea pedis: Secondary | ICD-10-CM | POA: Diagnosis not present

## 2015-09-09 DIAGNOSIS — G5761 Lesion of plantar nerve, right lower limb: Secondary | ICD-10-CM | POA: Diagnosis not present

## 2015-09-13 ENCOUNTER — Ambulatory Visit (INDEPENDENT_AMBULATORY_CARE_PROVIDER_SITE_OTHER): Payer: Medicare Other | Admitting: Adult Health

## 2015-09-13 ENCOUNTER — Encounter: Payer: Self-pay | Admitting: Adult Health

## 2015-09-13 VITALS — BP 130/70 | Temp 97.8°F | Ht <= 58 in | Wt 142.7 lb

## 2015-09-13 DIAGNOSIS — E119 Type 2 diabetes mellitus without complications: Secondary | ICD-10-CM

## 2015-09-13 NOTE — Progress Notes (Signed)
Pre visit review using our clinic review tool, if applicable. No additional management support is needed unless otherwise documented below in the visit note. 

## 2015-09-13 NOTE — Patient Instructions (Signed)
Follow up in April for your next A1c. At that time we will discuss other options.   If your blood sugars are above 170 consistently, please let me know.

## 2015-09-13 NOTE — Progress Notes (Addendum)
Subjective:    Patient ID: Rebecca [name], female    DOB: 09/06/1945, 70 y.o.   MRN: [id number]  HPI  70 year old female who present to the office today for 2 week follow up for diabetes. At that time we had switched her to Glipizide from Metformin for bilateral leg cramping. A few weeks into the Glipizide she started to have leg cramping again.  Together, we discussed options and ultimately decided to go off the Glipizide to see if the cramping stopped.   Today in the office she reports that since stopping glipizide her cramping has resolved. She is controlling her blood sugar with diet. Per her journal, most of her blood sugars are in the 110-130 range, with one outlier being 175. She is on an [employer].   Review of Systems  Respiratory: Negative.   Cardiovascular: Negative.   Musculoskeletal: Negative.   Neurological: Negative.   All other systems reviewed and are negative.  Past Medical History  Diagnosis Date  . GERD (gastroesophageal reflux disease)   . MI (myocardial infarction) ([place])     age 20  . CAD (coronary artery disease)   . Chest pain, atypical   . Hyperlipidemia   . Hypertension     familiar  . Fatigue   . Hypothyroidism   . Fatigue   . Edema   . Asthma     extrinsic  . Osteoarthritis   . Keratosis, seborrheic   . Labyrinthitis   . Skin cancer     Melanoma   . IBS (irritable bowel syndrome)     hx    Social History   Social History  . Marital Status: Married    Spouse Name: N/A  . Number of Children: 2  . Years of Education: N/A   Occupational History  . RN-retired    Social History Main Topics  . Smoking status: Never Smoker   . Smokeless tobacco: Not on file  . Alcohol Use: No     Comment: rarely  . Drug Use: No  . Sexual Activity: Not on file   Other Topics Concern  . Not on file   Social History Narrative   ** Merged History Encounter **        Past Surgical History  Procedure Laterality Date  . Cholecystectomy    .  Tubal ligation    . Knee arthroplasty      total right  02-28-2010 Dr Pilar Plate Aluisio, left  . Eus  04/04/2012    Procedure: UPPER ENDOSCOPIC ULTRASOUND (EUS) LINEAR;  Surgeon: Milus Banister, MD;  Location: [facility];  Service: Endoscopy;  Laterality: N/A;  radial linear  . Breast biopsy      dense breast tissue    Family History  Problem Relation Age of Onset  . Melanoma Daughter 61    METASTATIC  . Heart disease Mother   . Breast cancer Sister   . Hypertension Father   . Hypertension Mother   . Hypertension Sister   . Heart attack Mother 45    MULTIPLES OVER TIME    Allergies  Allergen Reactions  . Glipizide     Muscle aches   . Metformin And Related Other (See Comments)    Muscle spasms/soreness   . Sulfa Antibiotics   . Sulfonamide Derivatives     REACTION: Kathreen Cosier syndrome    Current Outpatient Prescriptions on File Prior to Visit  Medication Sig Dispense Refill  . ACCU-CHEK FASTCLIX LANCETS MISC Test 3 times daily.  102 each 5  . acetaminophen (TYLENOL) 325 MG tablet Take 650 mg by mouth every 6 (six) hours as needed.      Marland Kitchen albuterol (PROVENTIL HFA;VENTOLIN HFA) 108 (90 BASE) MCG/ACT inhaler Inhale 2 puffs into the lungs every 6 (six) hours as needed for wheezing or shortness of breath. 1 Inhaler 0  . albuterol (PROVENTIL) (2.5 MG/3ML) 0.083% nebulizer solution Take 3 mLs (2.5 mg total) by nebulization every 6 (six) hours as needed for wheezing or shortness of breath. 150 mL 1  . aspirin 325 MG tablet Take 325 mg by mouth daily.      Marland Kitchen atorvastatin (LIPITOR) 80 MG tablet Take 1 tablet (80 mg total) by mouth daily. 100 tablet 2  . calcium carbonate (OS-CAL) 600 MG TABS Take 1,200 mg by mouth daily.     . Cholecalciferol (VITAMIN D) 2000 UNITS CAPS Take 1 capsule by mouth daily.      Marland Kitchen conjugated estrogens (PREMARIN) vaginal cream Place 0.5 Applicatorfuls vaginally every 7 (seven) days. 42.5 g 6  . diltiazem (CARDIZEM CD) 180 MG 24 hr capsule Take 1 capsule  (180 mg total) by mouth daily. 100 capsule 2  . diphenoxylate-atropine (LOMOTIL) 2.5-0.025 MG per tablet Take 1 tablet by mouth 4 (four) times daily as needed for diarrhea or loose stools. 30 tablet 0  . fluticasone (FLOVENT HFA) 110 MCG/ACT inhaler Inhale 2 puffs into the lungs daily. 1 Inhaler 12  . furosemide (LASIX) 20 MG tablet Take 1 tablet (20 mg total) by mouth 2 (two) times daily. 180 tablet 2  . glucose blood (ACCU-CHEK SMARTVIEW) test strip Test 3 times daily. 100 each 5  . HYDROcodone-homatropine (HYCODAN) 5-1.5 MG/5ML syrup Take 5 mLs by mouth every 8 (eight) hours as needed for cough. 120 mL 0  . ibuprofen (ADVIL,MOTRIN) 200 MG tablet Take 600 mg by mouth every 6 (six) hours as needed for pain.    Marland Kitchen levothyroxine (SYNTHROID, LEVOTHROID) 50 MCG tablet Take 1 tablet (50 mcg total) by mouth as needed. 90 tablet 2  . Loperamide HCl (IMODIUM A-D PO) Take 1 tablet by mouth as needed.    . Naphazoline-Polyethyl Glycol (EYE DROPS) 0.012-0.2 % SOLN Apply to eye as needed. Artifical eye drops    . nitroGLYCERIN (NITROSTAT) 0.4 MG SL tablet Place 1 tablet (0.4 mg total) under the tongue every 5 (five) minutes as needed. 25 tablet 3  . Omega-3 Fatty Acids (FISH OIL) 1200 MG CAPS Take by mouth.      . potassium chloride SA (K-DUR,KLOR-CON) 20 MEQ tablet Take 1 tablet (20 mEq total) by mouth 2 (two) times daily. 180 tablet 2  . tiZANidine (ZANAFLEX) 4 MG tablet Take 1 tablet (4 mg total) by mouth every 6 (six) hours as needed for muscle spasms. 30 tablet 3   No current facility-administered medications on file prior to visit.    BP 130/70 mmHg  Temp(Src) 97.8 F (36.6 C) (Oral)  Ht 4\' 10"  (1.473 m)  Wt 142 lb 11.2 oz (64.728 kg)  BMI 29.83 kg/m2       Objective:   Physical Exam  Constitutional: She is oriented to person, place, and time. She appears well-developed and well-nourished. No distress.  Cardiovascular: Normal rate, regular rhythm, normal heart sounds and intact distal  pulses.  Exam reveals no gallop and no friction rub.   No murmur heard. Pulmonary/Chest: Effort normal and breath sounds normal. No respiratory distress. She has no wheezes. She has no rales. She exhibits no tenderness.  Neurological: She  is alert and oriented to person, place, and time.  Skin: Skin is dry. No rash noted. She is not diaphoretic. No erythema. No pallor.  Psychiatric: She has a normal mood and affect. Her behavior is normal. Judgment and thought content normal.  Nursing note and vitals reviewed.     Assessment & Plan:  1. Diabetes mellitus without complication ([place]) - Will keep her off medication until next A1c check. At that time we can discuss medication therapy. I am considering Invokana or Trulicuty.  - Hemoglobin A1c; Future - Follow up if BS above 170 consistently.  - I would like her to check her blood sugar levels 2 times per day ( morning and evening) while we keep her off medication  [name], NP

## 2015-09-20 ENCOUNTER — Other Ambulatory Visit: Payer: Self-pay | Admitting: Adult Health

## 2015-10-01 MED ORDER — LEVOTHYROXINE SODIUM 50 MCG PO TABS: 50.0000 ug | ORAL_TABLET | Freq: Every day | ORAL | Status: DC

## 2015-10-01 NOTE — Addendum Note (Signed)
Addended by: Colleen Can on: 10/01/2015 04:59 PM   Modules accepted: Orders

## 2015-10-05 ENCOUNTER — Other Ambulatory Visit: Payer: Self-pay

## 2015-10-05 DIAGNOSIS — Z1231 Encounter for screening mammogram for malignant neoplasm of breast: Secondary | ICD-10-CM

## 2015-11-01 ENCOUNTER — Other Ambulatory Visit (INDEPENDENT_AMBULATORY_CARE_PROVIDER_SITE_OTHER): Payer: Medicare Other

## 2015-11-01 DIAGNOSIS — E119 Type 2 diabetes mellitus without complications: Secondary | ICD-10-CM

## 2015-11-01 LAB — HEMOGLOBIN A1C: Hgb A1c MFr Bld: 6.7 % — ABNORMAL HIGH (ref 4.6–6.5)

## 2015-11-05 ENCOUNTER — Encounter: Payer: Self-pay | Admitting: Adult Health

## 2015-11-05 ENCOUNTER — Ambulatory Visit (INDEPENDENT_AMBULATORY_CARE_PROVIDER_SITE_OTHER): Payer: Medicare Other | Admitting: Adult Health

## 2015-11-05 VITALS — BP 104/64 | Temp 97.6°F | Ht <= 58 in | Wt 146.6 lb

## 2015-11-05 DIAGNOSIS — E039 Hypothyroidism, unspecified: Secondary | ICD-10-CM

## 2015-11-05 DIAGNOSIS — I10 Essential (primary) hypertension: Secondary | ICD-10-CM

## 2015-11-05 DIAGNOSIS — Z0000 Encounter for general adult medical examination without abnormal findings: Secondary | ICD-10-CM

## 2015-11-05 DIAGNOSIS — E119 Type 2 diabetes mellitus without complications: Secondary | ICD-10-CM

## 2015-11-05 DIAGNOSIS — E785 Hyperlipidemia, unspecified: Secondary | ICD-10-CM | POA: Diagnosis not present

## 2015-11-05 DIAGNOSIS — M1990 Unspecified osteoarthritis, unspecified site: Secondary | ICD-10-CM

## 2015-11-05 LAB — HEPATIC FUNCTION PANEL
ALT: 25 U/L (ref 0–35)
AST: 16 U/L (ref 0–37)
Albumin: 4.5 g/dL (ref 3.5–5.2)
Alkaline Phosphatase: 122 U/L — ABNORMAL HIGH (ref 39–117)
BILIRUBIN DIRECT: 0.1 mg/dL (ref 0.0–0.3)
BILIRUBIN TOTAL: 0.4 mg/dL (ref 0.2–1.2)
Total Protein: 6.7 g/dL (ref 6.0–8.3)

## 2015-11-05 LAB — BASIC METABOLIC PANEL
BUN: 19 mg/dL (ref 6–23)
CHLORIDE: 99 meq/L (ref 96–112)
CO2: 34 meq/L — AB (ref 19–32)
CREATININE: 0.79 mg/dL (ref 0.40–1.20)
Calcium: 10.2 mg/dL (ref 8.4–10.5)
GFR: 76.13 mL/min (ref >60.00)
GLUCOSE: 126 mg/dL — AB (ref 70–99)
Potassium: 4.1 mEq/L (ref 3.5–5.1)
Sodium: 141 mEq/L (ref 135–145)

## 2015-11-05 LAB — CBC WITH DIFFERENTIAL/PLATELET
BASOS PCT: 0.5 % (ref 0.0–3.0)
Basophils Absolute: 0 10*3/uL (ref 0.0–0.1)
EOS ABS: 0.1 10*3/uL (ref 0.0–0.7)
Eosinophils Relative: 1.9 % (ref 0.0–5.0)
HCT: 42 % (ref 36.0–46.0)
Hemoglobin: 14 g/dL (ref 12.0–15.0)
LYMPHS ABS: 2.1 10*3/uL (ref 0.7–4.0)
Lymphocytes Relative: 28.4 % (ref 12.0–46.0)
MCHC: 33.4 g/dL (ref 30.0–36.0)
MCV: 91.2 fl (ref 78.0–100.0)
MONO ABS: 0.4 10*3/uL (ref 0.1–1.0)
Monocytes Relative: 5.8 % (ref 3.0–12.0)
NEUTROS ABS: 4.7 10*3/uL (ref 1.4–7.7)
Neutrophils Relative %: 63.4 % (ref 43.0–77.0)
PLATELETS: 307 10*3/uL (ref 150.0–400.0)
RBC: 4.6 Mil/uL (ref 3.87–5.11)
RDW: 13.7 % (ref 11.5–15.5)
WBC: 7.5 10*3/uL (ref 4.0–10.5)

## 2015-11-05 LAB — POC URINALSYSI DIPSTICK (AUTOMATED)
BILIRUBIN UA: NEGATIVE
Blood, UA: NEGATIVE
Glucose, UA: NEGATIVE
KETONES UA: NEGATIVE
LEUKOCYTES UA: NEGATIVE
Nitrite, UA: NEGATIVE
PH UA: 5.5
Protein, UA: NEGATIVE
Spec Grav, UA: 1.01
Urobilinogen, UA: 0.2

## 2015-11-05 LAB — MICROALBUMIN / CREATININE URINE RATIO
CREATININE, U: 17.7 mg/dL
MICROALB/CREAT RATIO: 0.6 mg/g (ref 0.0–30.0)

## 2015-11-05 LAB — LIPID PANEL
CHOLESTEROL: 170 mg/dL (ref 0–200)
HDL: 61.4 mg/dL (ref >39.00)
LDL Cholesterol: 71 mg/dL (ref 0–99)
NonHDL: 108.17
Total CHOL/HDL Ratio: 3
Triglycerides: 184 mg/dL — ABNORMAL HIGH (ref 0.0–149.0)
VLDL: 36.8 mg/dL (ref 0.0–40.0)

## 2015-11-05 LAB — VITAMIN D 25 HYDROXY (VIT D DEFICIENCY, FRACTURES): VITD: 44.87 ng/mL (ref 30.00–100.00)

## 2015-11-05 LAB — TSH: TSH: 0.75 u[IU]/mL (ref 0.35–4.50)

## 2015-11-05 NOTE — Progress Notes (Signed)
Subjective:  Patient presents today for their annual wellness visit.  She is a pleasant caucasian female who  has a past medical history of GERD (gastroesophageal reflux disease); MI (myocardial infarction) ([place]); CAD (coronary artery disease); Chest pain, atypical; Hyperlipidemia; Hypertension; Fatigue; Hypothyroidism; Fatigue; Edema; Asthma; Osteoarthritis; Keratosis, seborrheic; Labyrinthitis; Skin cancer; and IBS (irritable bowel syndrome).  Preventive Screening-Counseling & Management  Smoking Status: Never Smoker Second Hand Smoking status: No smokers in home  Risk Factors Regular exercise: Exercises regularly Diet: Eats a diabetic diet  Fall Risk: None   Cardiac risk factors:  advanced age (older than 29 for men, 70 for women)  Hyperlipidemia  Diabetes. Family History: Heart disease and hypertension  Depression Screen None. PHQ2 0   Activities of Daily Living Independent ADLs and IADLs  Hearing Difficulties: patient declines  Cognitive Testing No reported trouble.   Normal 3 word recall  List the Names of Other Physician/Practitioners you currently use: 1.Dr. Scheryl Marten - opthamology 2. Dr. Maureen Ralphs - Orthopedics 3. Dr. Tonia Brooms - Dermatology Immunization History  Administered Date(s) Administered  . Hep A / Hep B 11/22/2009, 12/20/2009  . Influenza Split 03/30/2011, 04/12/2012  . Influenza Whole 04/26/2007, 04/13/2009  . Influenza, High Dose Seasonal PF 04/23/2015  . Influenza-Unspecified 04/18/2014  . Pneumococcal Conjugate-13 06/30/2014  . Pneumococcal Polysaccharide-23 07/18/2003, 02/23/2009  . Td 07/17/2001  . Tdap 05/16/2011  . Zoster 04/05/2010   Required Immunizations needed today: None  Screening tests- up to date Health Maintenance Due  Topic Date Due  . Hepatitis C Screening  Apr 13, 1946  . FOOT EXAM  04/19/1954  . DEXA SCAN  04/19/2009  . URINE MICROALBUMIN  07/01/2015  . PNA vac Low Risk Adult (2 of 2 - PPSV23) 07/01/2015     ROS- No pertinent positives discovered in course of AWV  The following were reviewed and entered/updated in epic: Past Medical History  Diagnosis Date  . GERD (gastroesophageal reflux disease)   . MI (myocardial infarction) ([place])     age 69  . CAD (coronary artery disease)   . Chest pain, atypical   . Hyperlipidemia   . Hypertension     familiar  . Fatigue   . Hypothyroidism   . Fatigue   . Edema   . Asthma     extrinsic  . Osteoarthritis   . Keratosis, seborrheic   . Labyrinthitis   . Skin cancer     Melanoma   . IBS (irritable bowel syndrome)     hx   Patient Active Problem List   Diagnosis Date Noted  . Diabetes mellitus without complication ([place]) 0000000  . Reactive airway disease with acute exacerbation 09/10/2014  . Nonspecific (abnormal) findings on radiological and other examination of gastrointestinal tract 04/04/2012  . Insomnia, idiopathic 10/04/2010  . Hyperlipidemia 09/22/2009  . Essential hypertension 09/22/2009  . CAD, NATIVE VESSEL 09/22/2009  . EDEMA 05/19/2009  . Hypothyroidism 02/23/2009  . EXTRINSIC ASTHMA, UNSPECIFIED 02/10/2009  . KERATOSIS, SEBORRHEIC Newborn 04/16/2007  . SKIN CANCER, HX OF 02/14/2007  . Depression 01/24/2007  . MYOCARDIAL INFARCTION, HX OF 01/24/2007  . GERD 01/24/2007  . OSTEOARTHRITIS 01/24/2007  . COLONIC POLYPS, HX OF 01/24/2007  . IRRITABLE BOWEL SYNDROME, HX OF 01/24/2007   Past Surgical History  Procedure Laterality Date  . Cholecystectomy    . Tubal ligation    . Knee arthroplasty      total right  02-28-2010 Dr Pilar Plate Aluisio, left  . Eus  04/04/2012    Procedure: UPPER ENDOSCOPIC ULTRASOUND (EUS)  LINEAR;  Surgeon: Milus Banister, MD;  Location: [facility];  Service: Endoscopy;  Laterality: N/A;  radial linear  . Breast biopsy      dense breast tissue    Family History  Problem Relation Age of Onset  . Melanoma Daughter 18    METASTATIC  . Heart disease Mother   . Breast cancer Sister   .  Hypertension Father   . Hypertension Mother   . Hypertension Sister   . Heart attack Mother 105    MULTIPLES OVER TIME    Medications- reviewed and updated Current Outpatient Prescriptions  Medication Sig Dispense Refill  . ACCU-CHEK FASTCLIX LANCETS MISC Test 3 times daily. 102 each 5  . acetaminophen (TYLENOL) 325 MG tablet Take 650 mg by mouth every 6 (six) hours as needed.      Marland Kitchen albuterol (PROVENTIL HFA;VENTOLIN HFA) 108 (90 BASE) MCG/ACT inhaler Inhale 2 puffs into the lungs every 6 (six) hours as needed for wheezing or shortness of breath. 1 Inhaler 0  . albuterol (PROVENTIL) (2.5 MG/3ML) 0.083% nebulizer solution Take 3 mLs (2.5 mg total) by nebulization every 6 (six) hours as needed for wheezing or shortness of breath. 150 mL 1  . aspirin 325 MG tablet Take 325 mg by mouth daily.      Marland Kitchen atorvastatin (LIPITOR) 80 MG tablet Take 1 tablet (80 mg total) by mouth daily. 100 tablet 2  . calcium carbonate (OS-CAL) 600 MG TABS Take 1,200 mg by mouth daily.     . Cholecalciferol (VITAMIN D) 2000 UNITS CAPS Take 1 capsule by mouth daily.      Marland Kitchen conjugated estrogens (PREMARIN) vaginal cream Place 0.5 Applicatorfuls vaginally every 7 (seven) days. 42.5 g 6  . diltiazem (CARDIZEM CD) 180 MG 24 hr capsule Take 1 capsule (180 mg total) by mouth daily. 100 capsule 2  . diphenoxylate-atropine (LOMOTIL) 2.5-0.025 MG per tablet Take 1 tablet by mouth 4 (four) times daily as needed for diarrhea or loose stools. 30 tablet 0  . fluticasone (FLOVENT HFA) 110 MCG/ACT inhaler Inhale 2 puffs into the lungs daily. 1 Inhaler 12  . furosemide (LASIX) 20 MG tablet Take 1 tablet (20 mg total) by mouth 2 (two) times daily. 180 tablet 2  . glucose blood (ACCU-CHEK SMARTVIEW) test strip Test 3 times daily. 100 each 5  . HYDROcodone-homatropine (HYCODAN) 5-1.5 MG/5ML syrup Take 5 mLs by mouth every 8 (eight) hours as needed for cough. 120 mL 0  . ibuprofen (ADVIL,MOTRIN) 200 MG tablet Take 600 mg by mouth every 6  (six) hours as needed for pain.    Marland Kitchen levothyroxine (SYNTHROID, LEVOTHROID) 50 MCG tablet Take 1 tablet (50 mcg total) by mouth daily before breakfast. 90 tablet 2  . Loperamide HCl (IMODIUM A-D PO) Take 1 tablet by mouth as needed.    . Naphazoline-Polyethyl Glycol (EYE DROPS) 0.012-0.2 % SOLN Apply to eye as needed. Artifical eye drops    . nitroGLYCERIN (NITROSTAT) 0.4 MG SL tablet Place 1 tablet (0.4 mg total) under the tongue every 5 (five) minutes as needed. 25 tablet 3  . Omega-3 Fatty Acids (FISH OIL) 1200 MG CAPS Take by mouth.      . potassium chloride SA (K-DUR,KLOR-CON) 20 MEQ tablet Take 1 tablet (20 mEq total) by mouth 2 (two) times daily. 180 tablet 2  . tiZANidine (ZANAFLEX) 4 MG tablet Take 1 tablet (4 mg total) by mouth every 6 (six) hours as needed for muscle spasms. 30 tablet 3  . glipiZIDE (GLUCOTROL) 5  MG tablet Take 1 tablet by mouth two  times daily before a meal (Patient not taking: Reported on 11/05/2015) 180 tablet 1   No current facility-administered medications for this visit.    Allergies-reviewed and updated Allergies  Allergen Reactions  . Glipizide     Muscle aches   . Metformin And Related Other (See Comments)    Muscle spasms/soreness   . Sulfa Antibiotics   . Sulfonamide Derivatives     REACTION: Kathreen Cosier syndrome    Social History   Social History  . Marital Status: Married    Spouse Name: N/A  . Number of Children: 2  . Years of Education: N/A   Occupational History  . RN-retired    Social History Main Topics  . Smoking status: Never Smoker   . Smokeless tobacco: None  . Alcohol Use: No     Comment: rarely  . Drug Use: No  . Sexual Activity: Not Asked   Other Topics Concern  . None   Social History Narrative   ** Merged History Encounter **        Objective: Temp(Src) 97.6 F (36.4 C) (Oral)  Ht 4\' 10"  (1.473 m)  Wt 146 lb 9.6 oz (66.497 kg)  BMI 30.65 kg/m2 Constitutional: She is oriented to person, place, and time.  She appears well-developed and well-nourished. No distress.  HEENT: Mucous membranes are moist. Oropharynx normal Neck: no thyromegaly Cardiovascular: Normal rate, regular rhythm, normal heart sounds and intact distal pulses. Exam reveals no gallop and no friction rub.No carotid bruit.  No murmur heard. Pulmonary/Chest: Effort normal and breath sounds normal. No respiratory distress. She has no wheezes. She has no rales. She exhibits no tenderness.  Neurological: She is alert and oriented to person, place, and time.  Abdominal: No distention, masses, tenderness, rebound or bruit.  Skin: Skin is dry. No rash noted. She is not diaphoretic. No erythema. No pallor. No signs of malignancy Psychiatric: She has a normal mood and affect. Her behavior is normal. Judgment and thought content normal.  Nursing note and vitals reviewed.  She refused breast exam and did not want an EKG  Assessment/Plan: 1. Medicare annual wellness visit, subsequent - Follow up in one year for CPE - Continue to exercise and eat healthy  2. Essential hypertension - Controlled on current medication.  - Hypotensive here today but not symptomatic.  - She follows up with Cardiology in one week.  - POCT Urinalysis Dipstick (Automated) - Basic metabolic panel - CBC with Differential/Platelet - Hepatic function panel - Lipid panel - POCT urinalysis dipstick - TSH - Microalbumin / creatinine urine ratio - Vitamin D, 25-hydroxy  3. Diabetes mellitus without complication ([place]) - Diet controlled. Last A1c 6.7  - POCT Urinalysis Dipstick (Automated) - Basic metabolic panel - CBC with Differential/Platelet - Hepatic function panel - Lipid panel - POCT urinalysis dipstick - TSH - Microalbumin / creatinine urine ratio - Vitamin D, 25-hydroxy  4. Hypothyroidism, unspecified hypothyroidism type - POCT Urinalysis Dipstick (Automated) - Basic metabolic panel - CBC with Differential/Platelet - Hepatic function panel -  Lipid panel - POCT urinalysis dipstick - TSH - Microalbumin / creatinine urine ratio - Vitamin D, 25-hydroxy - Consider changing dose 5. Hyperlipidemia - POCT Urinalysis Dipstick (Automated) - Basic metabolic panel - CBC with Differential/Platelet - Hepatic function panel - Lipid panel - POCT urinalysis dipstick - TSH - Microalbumin / creatinine urine ratio - Vitamin D, 25-hydroxy - Controlled with Lipitor  6. Osteoarthritis, unspecified osteoarthritis type, unspecified site -  Vitamin D, 25-hydroxy - Consider Bone density. She did not want one at this time.   Return precautions advised.   [name], NP

## 2015-11-05 NOTE — Patient Instructions (Addendum)
It was great seeing you again!  You are doing an amazing job controlling your blood sugars.   Continue to exercise and eat healthy.   I will follow up with you regarding your blood work.   Please let me know if you need anything.   Menopause is a normal process in which your reproductive ability comes to an end. This process happens gradually over a span of months to years, usually between the ages of 25 and 57. Menopause is complete when you have missed 12 consecutive menstrual periods. It is important to talk with your health care provider about some of the most common conditions that affect postmenopausal women, such as heart disease, cancer, and bone loss (osteoporosis). Adopting a healthy lifestyle and getting preventive care can help to promote your health and wellness. Those actions can also lower your chances of developing some of these common conditions. WHAT SHOULD I KNOW ABOUT MENOPAUSE? During menopause, you may experience a number of symptoms, such as:  Moderate-to-severe hot flashes.  Night sweats.  Decrease in sex drive.  Mood swings.  Headaches.  Tiredness.  Irritability.  Memory problems.  Insomnia. Choosing to treat or not to treat menopausal changes is an individual decision that you make with your health care provider. WHAT SHOULD I KNOW ABOUT HORMONE REPLACEMENT THERAPY AND SUPPLEMENTS? Hormone therapy products are effective for treating symptoms that are associated with menopause, such as hot flashes and night sweats. Hormone replacement carries certain risks, especially as you become older. If you are thinking about using estrogen or estrogen with progestin treatments, discuss the benefits and risks with your health care provider. WHAT SHOULD I KNOW ABOUT HEART DISEASE AND STROKE? Heart disease, heart attack, and stroke become more likely as you age. This may be due, in part, to the hormonal changes that your body experiences during menopause. These can  affect how your body processes dietary fats, triglycerides, and cholesterol. Heart attack and stroke are both medical emergencies. There are many things that you can do to help prevent heart disease and stroke:  Have your blood pressure checked at least every 1-2 years. High blood pressure causes heart disease and increases the risk of stroke.  If you are 26-76 years old, ask your health care provider if you should take aspirin to prevent a heart attack or a stroke.  Do not use any tobacco products, including cigarettes, chewing tobacco, or electronic cigarettes. If you need help quitting, ask your health care provider.  It is important to eat a healthy diet and maintain a healthy weight.  Be sure to include plenty of vegetables, fruits, low-fat dairy products, and lean protein.  Avoid eating foods that are high in solid fats, added sugars, or salt (sodium).  Get regular exercise. This is one of the most important things that you can do for your health.  Try to exercise for at least 150 minutes each week. The type of exercise that you do should increase your heart rate and make you sweat. This is known as moderate-intensity exercise.  Try to do strengthening exercises at least twice each week. Do these in addition to the moderate-intensity exercise.  Know your numbers.Ask your health care provider to check your cholesterol and your blood glucose. Continue to have your blood tested as directed by your health care provider. WHAT SHOULD I KNOW ABOUT CANCER SCREENING? There are several types of cancer. Take the following steps to reduce your risk and to catch any cancer development as early as possible. Breast  Cancer  Practice breast self-awareness.  This means understanding how your breasts normally appear and feel.  It also means doing regular breast self-exams. Let your health care provider know about any changes, no matter how small.  If you are 7 or older, have a clinician do a  breast exam (clinical breast exam or CBE) every year. Depending on your age, family history, and medical history, it may be recommended that you also have a yearly breast X-ray (mammogram).  If you have a family history of breast cancer, talk with your health care provider about genetic screening.  If you are at high risk for breast cancer, talk with your health care provider about having an MRI and a mammogram every year.  Breast cancer (BRCA) gene test is recommended for women who have family members with BRCA-related cancers. Results of the assessment will determine the need for genetic counseling and BRCA1 and for BRCA2 testing. BRCA-related cancers include these types:  Breast. This occurs in males or females.  Ovarian.  Tubal. This may also be called fallopian tube cancer.  Cancer of the abdominal or pelvic lining (peritoneal cancer).  Prostate.  Pancreatic. Cervical, Uterine, and Ovarian Cancer Your health care provider may recommend that you be screened regularly for cancer of the pelvic organs. These include your ovaries, uterus, and vagina. This screening involves a pelvic exam, which includes checking for microscopic changes to the surface of your cervix (Pap test).  For women ages 21-65, health care providers may recommend a pelvic exam and a Pap test every three years. For women ages 60-65, they may recommend the Pap test and pelvic exam, combined with testing for human papilloma virus (HPV), every five years. Some types of HPV increase your risk of cervical cancer. Testing for HPV may also be done on women of any age who have unclear Pap test results.  Other health care providers may not recommend any screening for nonpregnant women who are considered low risk for pelvic cancer and have no symptoms. Ask your health care provider if a screening pelvic exam is right for you.  If you have had past treatment for cervical cancer or a condition that could lead to cancer, you need  Pap tests and screening for cancer for at least 20 years after your treatment. If Pap tests have been discontinued for you, your risk factors (such as having a new sexual partner) need to be reassessed to determine if you should start having screenings again. Some women have medical problems that increase the chance of getting cervical cancer. In these cases, your health care provider may recommend that you have screening and Pap tests more often.  If you have a family history of uterine cancer or ovarian cancer, talk with your health care provider about genetic screening.  If you have vaginal bleeding after reaching menopause, tell your health care provider.  There are currently no reliable tests available to screen for ovarian cancer. Lung Cancer Lung cancer screening is recommended for adults 58-52 years old who are at high risk for lung cancer because of a history of smoking. A yearly low-dose CT scan of the lungs is recommended if you:  Currently smoke.  Have a history of at least 30 pack-years of smoking and you currently smoke or have quit within the past 15 years. A pack-year is smoking an average of one pack of cigarettes per day for one year. Yearly screening should:  Continue until it has been 15 years since you quit.  Stop  if you develop a health problem that would prevent you from having lung cancer treatment. Colorectal Cancer  This type of cancer can be detected and can often be prevented.  Routine colorectal cancer screening usually begins at age 52 and continues through age 84.  If you have risk factors for colon cancer, your health care provider may recommend that you be screened at an earlier age.  If you have a family history of colorectal cancer, talk with your health care provider about genetic screening.  Your health care provider may also recommend using home test kits to check for hidden blood in your stool.  A small camera at the end of a tube can be used to  examine your colon directly (sigmoidoscopy or colonoscopy). This is done to check for the earliest forms of colorectal cancer.  Direct examination of the colon should be repeated every 5-10 years until age 74. However, if early forms of precancerous polyps or small growths are found or if you have a family history or genetic risk for colorectal cancer, you may need to be screened more often. Skin Cancer  Check your skin from head to toe regularly.  Monitor any moles. Be sure to tell your health care provider:  About any new moles or changes in moles, especially if there is a change in a mole's shape or color.  If you have a mole that is larger than the size of a pencil eraser.  If any of your family members has a history of skin cancer, especially at a young age, talk with your health care provider about genetic screening.  Always use sunscreen. Apply sunscreen liberally and repeatedly throughout the day.  Whenever you are outside, protect yourself by wearing long sleeves, pants, a wide-brimmed hat, and sunglasses. WHAT SHOULD I KNOW ABOUT OSTEOPOROSIS? Osteoporosis is a condition in which bone destruction happens more quickly than new bone creation. After menopause, you may be at an increased risk for osteoporosis. To help prevent osteoporosis or the bone fractures that can happen because of osteoporosis, the following is recommended:  If you are 37-68 years old, get at least 1,000 mg of calcium and at least 600 mg of vitamin D per day.  If you are older than age 1 but younger than age 35, get at least 1,200 mg of calcium and at least 600 mg of vitamin D per day.  If you are older than age 71, get at least 1,200 mg of calcium and at least 800 mg of vitamin D per day. Smoking and excessive alcohol intake increase the risk of osteoporosis. Eat foods that are rich in calcium and vitamin D, and do weight-bearing exercises several times each week as directed by your health care provider. WHAT  SHOULD I KNOW ABOUT HOW MENOPAUSE AFFECTS [place]? Depression may occur at any age, but it is more common as you become older. Common symptoms of depression include:  Low or sad mood.  Changes in sleep patterns.  Changes in appetite or eating patterns.  Feeling an overall lack of motivation or enjoyment of activities that you previously enjoyed.  Frequent crying spells. Talk with your health care provider if you think that you are experiencing depression. WHAT SHOULD I KNOW ABOUT IMMUNIZATIONS? It is important that you get and maintain your immunizations. These include:  Tetanus, diphtheria, and pertussis (Tdap) booster vaccine.  Influenza every year before the flu season begins.  Pneumonia vaccine.  Shingles vaccine. Your health care provider may also recommend other  immunizations.   This information is not intended to replace advice given to you by your health care provider. Make sure you discuss any questions you have with your health care provider.   Document Released: 08/25/2005 Document Revised: 07/24/2014 Document Reviewed: 03/05/2014 Elsevier Interactive Patient Education [employer].

## 2015-11-05 NOTE — Addendum Note (Signed)
Addended by: Gari Crown D on: 11/05/2015 02:03 PM   Modules accepted: Orders

## 2015-11-05 NOTE — Addendum Note (Signed)
Addended by: Apolinar Junes on: 11/05/2015 05:52 PM   Modules accepted: Level of Service

## 2015-11-09 DIAGNOSIS — G5761 Lesion of plantar nerve, right lower limb: Secondary | ICD-10-CM | POA: Diagnosis not present

## 2015-11-09 DIAGNOSIS — B353 Tinea pedis: Secondary | ICD-10-CM | POA: Diagnosis not present

## 2015-11-09 DIAGNOSIS — G5762 Lesion of plantar nerve, left lower limb: Secondary | ICD-10-CM | POA: Diagnosis not present

## 2015-11-10 ENCOUNTER — Ambulatory Visit
Admission: RE | Admit: 2015-11-10 | Discharge: 2015-11-10 | Disposition: A | Discharge status: Home / Self Care | Payer: Medicare Other | Source: Ambulatory Visit

## 2015-11-10 DIAGNOSIS — Z1231 Encounter for screening mammogram for malignant neoplasm of breast: Secondary | ICD-10-CM

## 2015-11-12 ENCOUNTER — Ambulatory Visit: Payer: Medicare Other | Admitting: Cardiology

## 2015-11-16 ENCOUNTER — Other Ambulatory Visit: Payer: Self-pay | Admitting: Adult Health

## 2015-11-18 ENCOUNTER — Encounter: Payer: Self-pay | Admitting: Cardiology

## 2015-11-18 ENCOUNTER — Ambulatory Visit (INDEPENDENT_AMBULATORY_CARE_PROVIDER_SITE_OTHER): Payer: Medicare Other | Admitting: Cardiology

## 2015-11-18 VITALS — BP 122/60 | HR 74 | Ht <= 58 in | Wt 145.0 lb

## 2015-11-18 DIAGNOSIS — I5033 Acute on chronic diastolic (congestive) heart failure: Secondary | ICD-10-CM

## 2015-11-18 DIAGNOSIS — I2583 Coronary atherosclerosis due to lipid rich plaque: Secondary | ICD-10-CM

## 2015-11-18 DIAGNOSIS — E785 Hyperlipidemia, unspecified: Secondary | ICD-10-CM

## 2015-11-18 DIAGNOSIS — I2510 Atherosclerotic heart disease of native coronary artery without angina pectoris: Secondary | ICD-10-CM

## 2015-11-18 DIAGNOSIS — I5032 Chronic diastolic (congestive) heart failure: Secondary | ICD-10-CM | POA: Insufficient documentation

## 2015-11-18 DIAGNOSIS — I10 Essential (primary) hypertension: Secondary | ICD-10-CM | POA: Diagnosis not present

## 2015-11-18 NOTE — Progress Notes (Signed)
Patient ID: Rebecca [name], female   DOB: Nov 04, 1945, 70 y.o.   MRN: [id number]    Patient ID: Rebecca [name], female   DOB: Oct 02, 1945, 70 y.o.   MRN: [id number]  Patient Name: Rebecca [name] Date of Encounter: 11/18/2015  Primary Care Provider:  [name], NP Primary Cardiologist:  Ena Dawley H  Patient Profile  Re-establish cardiology care  Problem List   Past Medical History  Diagnosis Date  . GERD (gastroesophageal reflux disease)   . MI (myocardial infarction) ([place])     age 32  . CAD (coronary artery disease)   . Chest pain, atypical   . Hyperlipidemia   . Hypertension     familiar  . Fatigue   . Hypothyroidism   . Fatigue   . Edema   . Asthma     extrinsic  . Osteoarthritis   . Keratosis, seborrheic   . Labyrinthitis   . Skin cancer     Melanoma   . IBS (irritable bowel syndrome)     hx   Past Surgical History  Procedure Laterality Date  . Cholecystectomy    . Tubal ligation    . Knee arthroplasty      total right  02-28-2010 Dr Pilar Plate Aluisio, left  . Eus  04/04/2012    Procedure: UPPER ENDOSCOPIC ULTRASOUND (EUS) LINEAR;  Surgeon: Milus Banister, MD;  Location: [facility];  Service: Endoscopy;  Laterality: N/A;  radial linear  . Breast biopsy      dense breast tissue    Allergies  Allergies  Allergen Reactions  . Sulfa Antibiotics   . Sulfonamide Derivatives     REACTION: Kathreen Cosier syndrome    Chief complain: Follow up for CAD  HPI  70 year old female with h/o CAD, MI in 1992, cath x 3, last time in 2010 with PCI to the RCA. She has been doing well, travelling a lot recently. Denies any chest pain. She has gain about 13 lbs since the last year. She is complaining of lower extremities edema for which she take double dose of lasix with some relief. No palpitations, syncope.  11/18/2015, patient's coming for 1 year follow-up, she remains very active attending as many as 8-10 glasses of local [employer] per week and experiences no depression  or shortness of breath. She states that her symptoms haven't changed. However on 2 occasions when she was shopping she experienced sudden onset chest tightness radiating to her jaw that immediately resolved with sublingual nitroglycerin. She has hard time managing her diabetes she was intolerant to glipizide and metformin and her diabetes is labile despite regular diet and exercise. She has worsening lower extremity edema and now takes 40 mg of Lasix daily. No orthopnea or paroxysmal dyspnea. No palpitations or syncope.   Home Medications  Prior to Admission medications   Medication Sig Start Date End Date Taking? Authorizing Provider  acetaminophen (TYLENOL) 325 MG tablet Take 650 mg by mouth every 6 (six) hours as needed.      Historical Provider, MD  albuterol (PROVENTIL HFA) 108 (90 BASE) MCG/ACT inhaler Inhale 2 puffs into the lungs every 6 (six) hours as needed for wheezing. 05/16/12   Dorena Cookey, MD  albuterol (PROVENTIL HFA;VENTOLIN HFA) 108 (90 BASE) MCG/ACT inhaler Inhale 2 puffs into the lungs every 6 (six) hours as needed for wheezing.    Historical Provider, MD  albuterol (PROVENTIL) (2.5 MG/3ML) 0.083% nebulizer solution Take 3 mLs (2.5 mg total) by nebulization 4 (four) times daily. 05/16/12   Jory Ee  Sherren Mocha, MD  aspirin 325 MG tablet Take 325 mg by mouth daily.      Historical Provider, MD  atorvastatin (LIPITOR) 80 MG tablet Take 1 tablet (80 mg total) by mouth daily. 05/16/12   Dorena Cookey, MD  beclomethasone (QVAR) 80 MCG/ACT inhaler Inhale 2 puffs into the lungs 2 (two) times daily. 05/16/12   Dorena Cookey, MD  beclomethasone (QVAR) 80 MCG/ACT inhaler Inhale 1 puff into the lungs 2 (two) times daily as needed. Shortness of breath    Historical Provider, MD  calcium carbonate (OS-CAL) 600 MG TABS Take 600 mg by mouth 2 (two) times daily with a meal.      Historical Provider, MD  calcium carbonate (OS-CAL) 600 MG TABS Take 600 mg by mouth daily.    Historical Provider, MD    cetirizine (ZYRTEC) 10 MG tablet Take 10 mg by mouth daily.      Historical Provider, MD  cetirizine (ZYRTEC) 10 MG tablet Take 10 mg by mouth daily as needed for allergies.    Historical Provider, MD  Cholecalciferol (VITAMIN D) 2000 UNITS CAPS Take 1 capsule by mouth daily.      Historical Provider, MD  conjugated estrogens (PREMARIN) vaginal cream Place 6.20 Applicatorfuls vaginally every 7 (seven) days. weekly 05/16/12   Dorena Cookey, MD  conjugated estrogens (PREMARIN) vaginal cream Place 0.5 g vaginally daily.    Historical Provider, MD  diazepam (VALIUM) 5 MG tablet Take 1 tablet (5 mg total) by mouth every 6 (six) hours as needed for anxiety. 10/17/12   Hoy Morn, MD  diltiazem (CARDIZEM CD) 180 MG 24 hr capsule Take 1 capsule (180 mg total) by mouth daily. 05/16/12   Dorena Cookey, MD  diltiazem (DILACOR XR) 180 MG 24 hr capsule Take 180 mg by mouth daily.    Historical Provider, MD  furosemide (LASIX) 20 MG tablet Take 1 tablet (20 mg total) by mouth 2 (two) times daily as needed. 05/23/12   Dorena Cookey, MD  HYDROcodone-acetaminophen (VICODIN ES) 7.5-750 MG per tablet One half to one tablet 3 times daily when necessary for pain 07/03/12   Dorena Cookey, MD  hyoscyamine (LEVSIN SL) 0.125 MG SL tablet Place 1 tablet (0.125 mg total) under the tongue every 4 (four) hours as needed. 05/16/12   Dorena Cookey, MD  ibuprofen (ADVIL,MOTRIN) 200 MG tablet Take 600 mg by mouth every 6 (six) hours as needed for pain.    Historical Provider, MD  levothyroxine (SYNTHROID, LEVOTHROID) 50 MCG tablet Take 1 tablet (50 mcg total) by mouth daily. 05/16/12   Dorena Cookey, MD  levothyroxine (SYNTHROID, LEVOTHROID) 50 MCG tablet Take 1 tablet (50 mcg total) by mouth daily before breakfast. 03/24/13   Dorena Cookey, MD  Loperamide HCl (IMODIUM A-D PO) Take 1 tablet by mouth as needed.    Historical Provider, MD  Meth-Hyo-M Bl-Benz Acd-Ph Sal (HYOPHEN PO) Take 1 tablet by mouth 2 (two) times daily.     Historical Provider, MD  mometasone (NASONEX) 50 MCG/ACT nasal spray Place 2 sprays into the nose as needed. 05/16/12   Dorena Cookey, MD  mometasone (NASONEX) 50 MCG/ACT nasal spray Place 2 sprays into the nose daily as needed. breathing    Historical Provider, MD  montelukast (SINGULAIR) 10 MG tablet Take 1 tablet (10 mg total) by mouth daily. 05/16/12   Dorena Cookey, MD  montelukast (SINGULAIR) 10 MG tablet Take 10 mg by mouth as needed.  Historical Provider, MD  Naphazoline-Polyethyl Glycol (EYE DROPS) 0.012-0.2 % SOLN Apply to eye. Artifical eye drops     Historical Provider, MD  nitrofurantoin, macrocrystal-monohydrate, (MACROBID) 100 MG capsule Take 100 mg by mouth 2 (two) times daily.    Historical Provider, MD  nitroGLYCERIN (NITROSTAT) 0.4 MG SL tablet Place 1 tablet (0.4 mg total) under the tongue every 5 (five) minutes as needed. 05/16/12   Dorena Cookey, MD  Omega-3 Fatty Acids (FISH OIL) 1200 MG CAPS Take by mouth.      Historical Provider, MD  omeprazole (PRILOSEC) 20 MG capsule Take 1 capsule (20 mg total) by mouth 2 (two) times daily. 05/16/12   Dorena Cookey, MD  oxyCODONE-acetaminophen (PERCOCET/ROXICET) 5-325 MG per tablet Take 1 tablet by mouth every 4 (four) hours as needed for pain. 10/17/12   Hoy Morn, MD  potassium chloride SA (K-DUR,KLOR-CON) 20 MEQ tablet Take 1 tablet (20 mEq total) by mouth daily. 05/16/12   Dorena Cookey, MD  salicylic acid 6 % gel Apply 1 application topically as needed. For feet    Historical Provider, MD  Salicylic Acid-Cleanser 6 % (CREAM) KIT Apply topically.      Historical Provider, MD    Family History  Family History  Problem Relation Age of Onset  . Melanoma Daughter 22    METASTATIC  . Heart disease Mother   . Breast cancer Sister   . Hypertension Father   . Hypertension Mother   . Hypertension Sister   . Heart attack Mother 34    MULTIPLES OVER TIME    Social History  Social History   Social History  . Marital  Status: Married    Spouse Name: N/A  . Number of Children: 2  . Years of Education: N/A   Occupational History  . RN-retired    Social History Main Topics  . Smoking status: Never Smoker   . Smokeless tobacco: Not on file  . Alcohol Use: No     Comment: rarely  . Drug Use: No  . Sexual Activity: Not on file   Other Topics Concern  . Not on file   Social History Narrative   ** Merged History Encounter **         Review of Systems General:  No chills, fever, night sweats or weight changes.  Cardiovascular:  No chest pain, dyspnea on exertion,  + lower exetrmities edema, orthopnea, palpitations, paroxysmal nocturnal dyspnea. Dermatological: No rash, lesions/masses Respiratory: No cough, dyspnea Urologic: No hematuria, dysuria Abdominal:   No nausea, vomiting, diarrhea, bright red blood per rectum, melena, or hematemesis Neurologic:  No visual changes, wkns, changes in mental status. All other systems reviewed and are otherwise negative except as noted above.  Physical Exam  Today: 118/64, HR 84 BPM General: Pleasant, NAD Psych: Normal affect. Neuro: Alert and oriented X 3. Moves all extremities spontaneously. HEENT: Normal  Neck: Supple without bruits or JVD. Lungs:  Resp regular and unlabored, CTA. Heart: RRR no s3, s4, or murmurs. Abdomen: Soft, non-tender, non-distended, BS + x 4.  Extremities: No clubbing, cyanosis or edema. DP/PT/Radials 2+ and equal bilaterally.  Accessory Clinical Findings  ECG - sinus rhythmnegative T waves in inferior leads unchanged from prior EKG.   Lipid Panel     Component Value Date/Time   CHOL 170 11/05/2015 1331   TRIG 184.0* 11/05/2015 1331   HDL 61.40 11/05/2015 1331   CHOLHDL 3 11/05/2015 1331   VLDL 36.8 11/05/2015 1331   LDLCALC 71 11/05/2015  1331   TTE 04/28/2013  Left ventricle: The cavity size was normal. Wall thickness was normal. The estimated ejection fraction was 60%. Wall motion was normal; there were no  regional wall motion abnormalities.  Aortic valve: Structurally normal valve. Cusp separation was normal. Doppler: Transvalvular velocity was within the normal range. There was no stenosis. No regurgitation.  Aorta: Aortic root: The aortic root was normal in size.  Mitral valve: Structurally normal valve. Leaflet separation was normal. Doppler: Transvalvular velocity was within the normal range. There was no evidence for stenosis. No regurgitation.  Left atrium: The atrium was normal in size.  Right ventricle: The cavity size was normal. Systolic function was normal.  Pulmonic valve: The valve appears to be grossly normal. Doppler: No significant regurgitation.  Tricuspid valve: Structurally normal valve. Leaflet separation was normal. Doppler: Transvalvular velocity was within the normal range. No regurgitation.  Right atrium: The atrium was normal in size.  Pericardium: There was no pericardial effusion.    Assessment & Plan  70 year old female, 1 year follow up  1. CAD, S/P PCI RCA in 2010, now stable, continue ASA 81 mg QD, atorvastatin 80 mg QD, omega 3 acids, no BB (asthma)she believes that her symptoms are related to GI problems, she has Prilosec at home that she takes as needed. She would rather not have a stress test at that time, I would agree she is to call us if her symptoms increase in frequency. The fact that she goes to high intensity access classes and has no symptoms is reassuring. EKG is unchanged from prior.   2. Hyperlipidemia LDL and HDL at goal. Slightly elevated triglycerides associated with poor diabetes control.   3. Hypertension - controlled   4. New diagnosis of diabetes in 2016 - HbA1c 7.4%cannot tolerate oral diabetes meds, diet and exercise only, hemoglobin and improved to 6.7%.   5. H/o hypothyroidism on Synthroid, TSH normal  6. Lower extremity edema - acute and chronic diastolic CHF - we will order echocardiogram to evaluate for  study Function and reevaluate filling pressures.  Follow up in 1 year.   Dorothy Spark, MD 11/18/2015, 9:19 AM

## 2015-11-18 NOTE — Patient Instructions (Signed)
Medication Instructions:   Your physician recommends that you continue on your current medications as directed. Please refer to the Current Medication list given to you today.   Labwork:  PRIOR TO YOUR ONE YEAR FOLLOW-UP APPOINTMENT WITH DR NELSON--WE WILL CHECK A CMET AND LIPIDS--PLEASE COME FASTING TO THIS LAB APPOINTMENT   Testing/Procedures:  Your physician has requested that you have an echocardiogram. Echocardiography is a painless test that uses sound waves to create images of your heart. It provides your doctor with information about the size and shape of your heart and how well your heart's chambers and valves are working. This procedure takes approximately one hour. There are no restrictions for this procedure.    Follow-Up:  Your physician wants you to follow-up in: [place] will receive a reminder letter in the mail two months in advance. If you don't receive a letter, please call our office to schedule the follow-up appointment.  PLEASE HAVE YOUR LABS SCHEDULED PRIOR TO THIS APPOINTMENT        If you need a refill on your cardiac medications before your next appointment, please call your pharmacy.

## 2015-11-25 ENCOUNTER — Other Ambulatory Visit: Payer: Self-pay | Admitting: General Practice

## 2015-11-25 MED ORDER — GLUCOSE BLOOD VI STRP: ORAL_STRIP | Status: DC

## 2015-11-30 ENCOUNTER — Other Ambulatory Visit: Payer: Self-pay | Admitting: *Deleted

## 2015-11-30 MED ORDER — GLUCOSE BLOOD VI STRP: ORAL_STRIP | Status: DC

## 2015-12-07 ENCOUNTER — Other Ambulatory Visit: Payer: Self-pay

## 2015-12-07 ENCOUNTER — Ambulatory Visit (HOSPITAL_COMMUNITY): Payer: Medicare Other | Attending: Cardiology

## 2015-12-07 DIAGNOSIS — I5033 Acute on chronic diastolic (congestive) heart failure: Secondary | ICD-10-CM | POA: Insufficient documentation

## 2015-12-07 DIAGNOSIS — E785 Hyperlipidemia, unspecified: Secondary | ICD-10-CM | POA: Diagnosis not present

## 2015-12-07 DIAGNOSIS — I110 Hypertensive heart disease with heart failure: Secondary | ICD-10-CM | POA: Diagnosis not present

## 2015-12-07 DIAGNOSIS — I10 Essential (primary) hypertension: Secondary | ICD-10-CM | POA: Diagnosis not present

## 2015-12-08 ENCOUNTER — Encounter: Payer: Self-pay | Admitting: Cardiology

## 2015-12-08 DIAGNOSIS — Z85828 Personal history of other malignant neoplasm of skin: Secondary | ICD-10-CM | POA: Diagnosis not present

## 2015-12-08 DIAGNOSIS — L821 Other seborrheic keratosis: Secondary | ICD-10-CM | POA: Diagnosis not present

## 2015-12-08 DIAGNOSIS — W57XXXA Bitten or stung by nonvenomous insect and other nonvenomous arthropods, initial encounter: Secondary | ICD-10-CM | POA: Diagnosis not present

## 2015-12-08 DIAGNOSIS — S80861A Insect bite (nonvenomous), right lower leg, initial encounter: Secondary | ICD-10-CM | POA: Diagnosis not present

## 2015-12-08 DIAGNOSIS — Z760 Encounter for issue of repeat prescription: Secondary | ICD-10-CM

## 2015-12-08 DIAGNOSIS — Z85820 Personal history of malignant melanoma of skin: Secondary | ICD-10-CM | POA: Diagnosis not present

## 2015-12-08 DIAGNOSIS — D485 Neoplasm of uncertain behavior of skin: Secondary | ICD-10-CM | POA: Diagnosis not present

## 2015-12-08 DIAGNOSIS — D225 Melanocytic nevi of trunk: Secondary | ICD-10-CM | POA: Diagnosis not present

## 2015-12-08 DIAGNOSIS — Z86018 Personal history of other benign neoplasm: Secondary | ICD-10-CM | POA: Diagnosis not present

## 2015-12-08 MED ORDER — FUROSEMIDE 20 MG PO TABS: 20.0000 mg | ORAL_TABLET | Freq: Two times a day (BID) | ORAL | Status: DC

## 2015-12-08 NOTE — Telephone Encounter (Signed)
Notified the pt that per Dr [name], her echo showed that she has normal systolic and diastolic function and no indication for heart failure.  Informed the pt that per Dr [name], she recommends that she continue lasix 20 mg po BID for her lower extremity edema and she may increase it to 40 mg on days when her lower extremity edema is worse.  Confirmed the pharmacy of choice with the pt.  Phoned-in the order for lasix 20 mg by mouth twice daily for lower extremity edema and increase it to 40 mg on days when your lower extremity edema is worse, to the pharmacist at [facility].  Pt verbalized understanding, and agrees with this plan.

## 2015-12-08 NOTE — Telephone Encounter (Signed)
-----   Message from Dorothy Spark, MD sent at 12/07/2015  3:36 PM EDT ----- She is normal systolic and diastolic function no indication for heart failure, I would continue Lasix 20 mg by mouth twice a day for her lower extremity edema and increase it to 40 on days when her lower extremity edema is worse.

## 2015-12-17 DIAGNOSIS — D485 Neoplasm of uncertain behavior of skin: Secondary | ICD-10-CM | POA: Diagnosis not present

## 2015-12-17 DIAGNOSIS — L089 Local infection of the skin and subcutaneous tissue, unspecified: Secondary | ICD-10-CM | POA: Diagnosis not present

## 2016-01-13 DIAGNOSIS — R609 Edema, unspecified: Secondary | ICD-10-CM | POA: Diagnosis not present

## 2016-01-13 DIAGNOSIS — M79672 Pain in left foot: Secondary | ICD-10-CM | POA: Diagnosis not present

## 2016-01-20 DIAGNOSIS — R609 Edema, unspecified: Secondary | ICD-10-CM | POA: Diagnosis not present

## 2016-01-20 DIAGNOSIS — M79672 Pain in left foot: Secondary | ICD-10-CM | POA: Diagnosis not present

## 2016-01-24 DIAGNOSIS — Z0100 Encounter for examination of eyes and vision without abnormal findings: Secondary | ICD-10-CM | POA: Diagnosis not present

## 2016-01-24 DIAGNOSIS — H01001 Unspecified blepharitis right upper eyelid: Secondary | ICD-10-CM | POA: Diagnosis not present

## 2016-01-24 DIAGNOSIS — E119 Type 2 diabetes mellitus without complications: Secondary | ICD-10-CM | POA: Diagnosis not present

## 2016-01-24 DIAGNOSIS — H04123 Dry eye syndrome of bilateral lacrimal glands: Secondary | ICD-10-CM | POA: Diagnosis not present

## 2016-01-24 LAB — HM DIABETES EYE EXAM

## 2016-01-25 ENCOUNTER — Encounter: Payer: Self-pay | Admitting: Adult Health

## 2016-01-30 ENCOUNTER — Encounter: Payer: Self-pay | Admitting: Adult Health

## 2016-02-01 ENCOUNTER — Other Ambulatory Visit: Payer: Self-pay | Admitting: Adult Health

## 2016-02-01 MED ORDER — CIPROFLOXACIN HCL 500 MG PO TABS: 500.0000 mg | ORAL_TABLET | Freq: Two times a day (BID) | ORAL | Status: DC | PRN

## 2016-02-02 ENCOUNTER — Other Ambulatory Visit: Payer: Self-pay | Admitting: Family Medicine

## 2016-02-02 ENCOUNTER — Other Ambulatory Visit: Payer: Self-pay | Admitting: Adult Health

## 2016-02-02 MED ORDER — METFORMIN HCL 500 MG PO TABS: ORAL_TABLET | ORAL | Status: DC

## 2016-02-02 NOTE — Telephone Encounter (Signed)
Ok to refill for one year  

## 2016-02-02 NOTE — Telephone Encounter (Signed)
Ok to refill 

## 2016-03-17 ENCOUNTER — Other Ambulatory Visit: Payer: Self-pay | Admitting: Adult Health

## 2016-03-17 NOTE — Telephone Encounter (Signed)
Ok to refill for one year  

## 2016-04-06 ENCOUNTER — Other Ambulatory Visit: Payer: Self-pay | Admitting: Adult Health

## 2016-04-06 DIAGNOSIS — Z760 Encounter for issue of repeat prescription: Secondary | ICD-10-CM

## 2016-04-06 DIAGNOSIS — I2510 Atherosclerotic heart disease of native coronary artery without angina pectoris: Secondary | ICD-10-CM

## 2016-04-14 DIAGNOSIS — Z23 Encounter for immunization: Secondary | ICD-10-CM | POA: Diagnosis not present

## 2016-04-21 ENCOUNTER — Telehealth: Payer: Self-pay | Admitting: Adult Health

## 2016-04-21 NOTE — Telephone Encounter (Signed)
Pt needs refills on potassium chloride, dilitiazem, furosemide and atorvastatin #90 w/refills [facility]

## 2016-05-04 NOTE — Telephone Encounter (Signed)
Correction done in sept 2017

## 2016-05-04 NOTE — Telephone Encounter (Signed)
Sorry I forgot to route

## 2016-06-06 ENCOUNTER — Ambulatory Visit (INDEPENDENT_AMBULATORY_CARE_PROVIDER_SITE_OTHER): Payer: Medicare Other | Admitting: Adult Health

## 2016-06-06 ENCOUNTER — Encounter: Payer: Self-pay | Admitting: Adult Health

## 2016-06-06 VITALS — BP 128/80 | Temp 97.4°F | Ht <= 58 in | Wt 150.6 lb

## 2016-06-06 DIAGNOSIS — J069 Acute upper respiratory infection, unspecified: Secondary | ICD-10-CM | POA: Diagnosis not present

## 2016-06-06 DIAGNOSIS — I2510 Atherosclerotic heart disease of native coronary artery without angina pectoris: Secondary | ICD-10-CM | POA: Diagnosis not present

## 2016-06-06 DIAGNOSIS — I2583 Coronary atherosclerosis due to lipid rich plaque: Secondary | ICD-10-CM

## 2016-06-06 DIAGNOSIS — Z760 Encounter for issue of repeat prescription: Secondary | ICD-10-CM | POA: Diagnosis not present

## 2016-06-06 DIAGNOSIS — E119 Type 2 diabetes mellitus without complications: Secondary | ICD-10-CM

## 2016-06-06 LAB — POCT GLYCOSYLATED HEMOGLOBIN (HGB A1C): HEMOGLOBIN A1C: 6.5

## 2016-06-06 MED ORDER — CANAGLIFLOZIN 100 MG PO TABS: 100.0000 mg | ORAL_TABLET | Freq: Every day | ORAL | 2 refills | Status: DC

## 2016-06-06 MED ORDER — ALBUTEROL SULFATE (2.5 MG/3ML) 0.083% IN NEBU: 2.5000 mg | INHALATION_SOLUTION | Freq: Four times a day (QID) | RESPIRATORY_TRACT | 1 refills | Status: DC | PRN

## 2016-06-06 MED ORDER — DOXYCYCLINE HYCLATE 100 MG PO CAPS: 100.0000 mg | ORAL_CAPSULE | Freq: Two times a day (BID) | ORAL | 0 refills | Status: DC

## 2016-06-06 MED ORDER — IPRATROPIUM-ALBUTEROL 0.5-2.5 (3) MG/3ML IN SOLN: 3.0000 mL | Freq: Once | RESPIRATORY_TRACT | Status: DC

## 2016-06-06 MED ORDER — ESTROGENS, CONJUGATED 0.625 MG/GM VA CREA: 1.0000 g | TOPICAL_CREAM | VAGINAL | 6 refills | Status: DC

## 2016-06-06 MED ORDER — BENZONATATE 200 MG PO CAPS: 200.0000 mg | ORAL_CAPSULE | Freq: Three times a day (TID) | ORAL | 1 refills | Status: DC | PRN

## 2016-06-06 NOTE — Progress Notes (Signed)
Subjective:    Patient ID: Rebecca [name], female    DOB: August 30, 1945, 70 y.o.   MRN: [id number]  URI   This is a new problem. The current episode started 1 to 4 weeks ago (3 weeks ). The problem has been waxing and waning. There has been no fever. Associated symptoms include congestion, coughing (semi productive ), nausea, rhinorrhea, sinus pain and wheezing. Pertinent negatives include no ear pain or sore throat. She has tried inhaler use and decongestant for the symptoms. The treatment provided mild relief.   She reports that her blood sugars have been in the 150-170's. She continues to take metformin 500mg  only as needed due to side effects    Review of Systems  Constitutional: Positive for activity change, appetite change, chills, diaphoresis and fatigue. Negative for fever and unexpected weight change.  HENT: Positive for congestion, postnasal drip, rhinorrhea, sinus pain, sinus pressure and voice change. Negative for ear discharge, ear pain, sore throat and trouble swallowing.   Respiratory: Positive for cough (semi productive ), chest tightness and wheezing.   Cardiovascular: Negative.   Gastrointestinal: Positive for nausea.  Neurological: Negative.   Hematological: Negative.    Past Medical History:  Diagnosis Date  . Asthma    extrinsic  . CAD (coronary artery disease)   . Chest pain, atypical   . Edema   . Fatigue   . Fatigue   . GERD (gastroesophageal reflux disease)   . Hyperlipidemia   . Hypertension    familiar  . Hypothyroidism   . IBS (irritable bowel syndrome)    hx  . Keratosis, seborrheic   . Labyrinthitis   . MI (myocardial infarction)    age 56  . Osteoarthritis   . Skin cancer    Melanoma     Social History   Social History  . Marital status: Married    Spouse name: N/A  . Number of children: 2  . Years of education: N/A   Occupational History  . RN-retired Retired   Social History Main Topics  . Smoking status: Never Smoker  .  Smokeless tobacco: Not on file  . Alcohol use No     Comment: rarely  . Drug use: No  . Sexual activity: Not on file   Other Topics Concern  . Not on file   Social History Narrative   ** Merged History Encounter **        Past Surgical History:  Procedure Laterality Date  . Breast Biopsy     dense breast tissue  . CHOLECYSTECTOMY    . EUS  04/04/2012   Procedure: UPPER ENDOSCOPIC ULTRASOUND (EUS) LINEAR;  Surgeon: Milus Banister, MD;  Location: [facility];  Service: Endoscopy;  Laterality: N/A;  radial linear  . KNEE ARTHROPLASTY     total right  02-28-2010 Dr Pilar Plate Aluisio, left  . TUBAL LIGATION      Family History  Problem Relation Age of Onset  . Melanoma Daughter 48    METASTATIC  . Heart disease Mother   . Breast cancer Sister   . Hypertension Father   . Hypertension Mother   . Hypertension Sister   . Heart attack Mother 76    MULTIPLES OVER TIME    Allergies  Allergen Reactions  . Sulfa Antibiotics   . Sulfonamide Derivatives     REACTION: Kathreen Cosier syndrome    Current Outpatient Prescriptions on File Prior to Visit  Medication Sig Dispense Refill  . ACCU-CHEK FASTCLIX LANCETS MISC Test 3  times daily. 102 each 5  . acetaminophen (TYLENOL) 325 MG tablet Take 650 mg by mouth every 6 (six) hours as needed.      Marland Kitchen albuterol (PROVENTIL HFA;VENTOLIN HFA) 108 (90 BASE) MCG/ACT inhaler Inhale 2 puffs into the lungs every 6 (six) hours as needed for wheezing or shortness of breath. 1 Inhaler 0  . aspirin 325 MG tablet Take 325 mg by mouth daily.      Marland Kitchen atorvastatin (LIPITOR) 80 MG tablet Take 1 tablet by mouth  daily 90 tablet 0  . calcium carbonate (OS-CAL) 600 MG TABS Take 1,200 mg by mouth daily.     . Cholecalciferol (VITAMIN D) 2000 UNITS CAPS Take 1 capsule by mouth daily.      . ciprofloxacin (CIPRO) 500 MG tablet Take 1 tablet (500 mg total) by mouth 2 (two) times daily as needed. 30 tablet 0  . diltiazem (CARDIZEM CD) 180 MG 24 hr capsule Take 1  capsule by mouth  daily 90 capsule 0  . diphenoxylate-atropine (LOMOTIL) 2.5-0.025 MG per tablet Take 1 tablet by mouth 4 (four) times daily as needed for diarrhea or loose stools. 30 tablet 0  . fluticasone (FLOVENT HFA) 110 MCG/ACT inhaler Inhale 2 puffs into the lungs daily. 1 Inhaler 12  . furosemide (LASIX) 20 MG tablet Take 1 tablet (20 mg total) by mouth 2 (two) times daily. You may increase it to 40 mg on days when your lower extremity edema is worse. 270 tablet 3  . furosemide (LASIX) 20 MG tablet Take 1 tablet by mouth two  times daily 180 tablet 0  . glucose blood (ACCU-CHEK SMARTVIEW) test strip USE  STRIP TO CHECK GLUCOSE TWICE DAILY.  Dx: E11.9 100 each 3  . ibuprofen (ADVIL,MOTRIN) 200 MG tablet Take 600 mg by mouth every 6 (six) hours as needed for pain.    Marland Kitchen levothyroxine (SYNTHROID, LEVOTHROID) 50 MCG tablet Take 1 tablet by mouth  daily before breakfast 90 tablet 3  . Loperamide HCl (IMODIUM A-D PO) Take 1 tablet by mouth as needed.    . metFORMIN (GLUCOPHAGE) 500 MG tablet Take 1/2 tablet by mouth twice daily with meals. 90 tablet 1  . Naphazoline-Polyethyl Glycol (EYE DROPS) 0.012-0.2 % SOLN Apply to eye as needed. Artifical eye drops    . nitroGLYCERIN (NITROSTAT) 0.4 MG SL tablet Place 1 tablet (0.4 mg total) under the tongue every 5 (five) minutes as needed. 25 tablet 3  . Omega-3 Fatty Acids (FISH OIL) 1200 MG CAPS Take by mouth.      . potassium chloride SA (K-DUR,KLOR-CON) 20 MEQ tablet Take 1 tablet by mouth two  times daily 180 tablet 1  . tiZANidine (ZANAFLEX) 4 MG tablet Take 1 tablet (4 mg total) by mouth every 6 (six) hours as needed for muscle spasms. 30 tablet 3   No current facility-administered medications on file prior to visit.     BP 128/80   Temp 97.4 F (36.3 C) (Oral)   Ht 4\' 10"  (1.473 m)   Wt 150 lb 9.6 oz (68.3 kg)   BMI 31.48 kg/m       Objective:   Physical Exam  Constitutional: She is oriented to person, place, and time. She appears  well-developed and well-nourished. No distress.  HENT:  Head: Normocephalic and atraumatic.  Right Ear: Hearing, tympanic membrane, external ear and ear canal normal.  Left Ear: Hearing, tympanic membrane, external ear and ear canal normal.  Nose: Mucosal edema and rhinorrhea present. Right sinus exhibits frontal  sinus tenderness. Left sinus exhibits frontal sinus tenderness.  Mouth/Throat: Uvula is midline and oropharynx is clear and moist. No oropharyngeal exudate, posterior oropharyngeal erythema or tonsillar abscesses.  Eyes: Conjunctivae and EOM are normal. Pupils are equal, round, and reactive to light. Right eye exhibits no discharge. Left eye exhibits no discharge. No scleral icterus.  Neck: Normal range of motion. Neck supple.  Cardiovascular: Normal rate, regular rhythm, normal heart sounds and intact distal pulses.  Exam reveals no gallop and no friction rub.   No murmur heard. Pulmonary/Chest: Effort normal. No respiratory distress. She has wheezes. She has no rales. She exhibits no tenderness.  Lymphadenopathy:    She has no cervical adenopathy.  Neurological: She is alert and oriented to person, place, and time.  Skin: Skin is warm and dry. No rash noted. She is not diaphoretic. No erythema. No pallor.  Psychiatric: She has a normal mood and affect. Her behavior is normal. Judgment and thought content normal.  Nursing note and vitals reviewed.     Assessment & Plan:  1. Upper respiratory tract infection, unspecified type - ipratropium-albuterol (DUONEB) 0.5-2.5 (3) MG/3ML nebulizer solution 3 mL; Take 3 mLs by nebulization once. - doxycycline (VIBRAMYCIN) 100 MG capsule; Take 1 capsule (100 mg total) by mouth 2 (two) times daily.  Dispense: 20 capsule; Refill: 0 - benzonatate (TESSALON) 200 MG capsule; Take 1 capsule (200 mg total) by mouth 3 (three) times daily as needed for cough.  Dispense: 20 capsule; Refill: 1 - Follow up if no improvement in the next 2-3 days  2.  Diabetes mellitus without complication ([place])  - POC HgB A1c - 6.5  - canagliflozin (INVOKANA) 100 MG TABS tablet; Take 1 tablet (100 mg total) by mouth daily before breakfast.  Dispense: 30 tablet; Refill: 2  3. Medication refill  - conjugated estrogens (PREMARIN) vaginal cream; Place 0.5 Applicatorfuls vaginally every 7 (seven) days.  Dispense: 42.5 g; Refill: 6 - albuterol (PROVENTIL) (2.5 MG/3ML) 0.083% nebulizer solution; Take 3 mLs (2.5 mg total) by nebulization every 6 (six) hours as needed for wheezing or shortness of breath.  Dispense: 150 mL; Refill: 1  [name], NP

## 2016-06-06 NOTE — Patient Instructions (Addendum)
It was great seeing you today   I have called in a prescription for Doxycycline, take this twice a day for 10 days.   You can use Mucinex   Your A1c is 6.5 - I have called in [place] for you. Please let me know how well this works for you  I have also called in Doxycycline and Tessalon pearls to help with the cough

## 2016-06-13 ENCOUNTER — Encounter: Payer: Self-pay | Admitting: Adult Health

## 2016-06-21 ENCOUNTER — Ambulatory Visit: Payer: Medicare Other

## 2016-07-03 ENCOUNTER — Encounter: Payer: Self-pay | Admitting: Adult Health

## 2016-07-05 ENCOUNTER — Other Ambulatory Visit: Payer: Self-pay | Admitting: Adult Health

## 2016-07-05 DIAGNOSIS — E119 Type 2 diabetes mellitus without complications: Secondary | ICD-10-CM

## 2016-07-05 MED ORDER — CANAGLIFLOZIN 100 MG PO TABS: 100.0000 mg | ORAL_TABLET | Freq: Every day | ORAL | 2 refills | Status: DC

## 2016-07-06 ENCOUNTER — Other Ambulatory Visit: Payer: Self-pay | Admitting: Adult Health

## 2016-07-06 NOTE — Telephone Encounter (Signed)
Ok to refill for one year  

## 2016-07-11 ENCOUNTER — Telehealth: Payer: Self-pay | Admitting: Adult Health

## 2016-07-11 ENCOUNTER — Other Ambulatory Visit: Payer: Self-pay | Admitting: Emergency Medicine

## 2016-07-11 MED ORDER — GLUCOSE BLOOD VI STRP: ORAL_STRIP | 3 refills | Status: DC

## 2016-07-11 MED ORDER — ACCU-CHEK FASTCLIX LANCETS MISC: 5 refills | Status: DC

## 2016-07-11 NOTE — Telephone Encounter (Signed)
[facility] on [address] needs dx code for her dm supplies. [facility] call [facility]

## 2016-07-11 NOTE — Telephone Encounter (Signed)
Rx's re-sent to pharmacy with dx codes.

## 2016-09-13 ENCOUNTER — Other Ambulatory Visit: Payer: Self-pay

## 2016-09-13 DIAGNOSIS — E119 Type 2 diabetes mellitus without complications: Secondary | ICD-10-CM

## 2016-09-13 DIAGNOSIS — I2510 Atherosclerotic heart disease of native coronary artery without angina pectoris: Secondary | ICD-10-CM

## 2016-09-13 MED ORDER — CANAGLIFLOZIN 100 MG PO TABS: 100.0000 mg | ORAL_TABLET | Freq: Every day | ORAL | 1 refills | Status: DC

## 2016-09-13 MED ORDER — DILTIAZEM HCL ER COATED BEADS 180 MG PO CP24: 180.0000 mg | ORAL_CAPSULE | Freq: Every day | ORAL | 1 refills | Status: DC

## 2016-10-23 ENCOUNTER — Other Ambulatory Visit: Payer: Self-pay | Admitting: Adult Health

## 2016-10-23 DIAGNOSIS — Z1231 Encounter for screening mammogram for malignant neoplasm of breast: Secondary | ICD-10-CM

## 2016-10-31 ENCOUNTER — Other Ambulatory Visit: Payer: Self-pay

## 2016-10-31 MED ORDER — GLUCOSE BLOOD VI STRP: ORAL_STRIP | 3 refills | Status: DC

## 2016-11-07 ENCOUNTER — Encounter: Payer: Self-pay | Admitting: Adult Health

## 2016-11-07 ENCOUNTER — Ambulatory Visit (INDEPENDENT_AMBULATORY_CARE_PROVIDER_SITE_OTHER): Payer: Medicare Other | Admitting: Adult Health

## 2016-11-07 ENCOUNTER — Other Ambulatory Visit: Payer: Self-pay

## 2016-11-07 DIAGNOSIS — E039 Hypothyroidism, unspecified: Secondary | ICD-10-CM

## 2016-11-07 DIAGNOSIS — E559 Vitamin D deficiency, unspecified: Secondary | ICD-10-CM | POA: Diagnosis not present

## 2016-11-07 DIAGNOSIS — Z1159 Encounter for screening for other viral diseases: Secondary | ICD-10-CM | POA: Diagnosis not present

## 2016-11-07 DIAGNOSIS — Z23 Encounter for immunization: Secondary | ICD-10-CM | POA: Diagnosis not present

## 2016-11-07 DIAGNOSIS — E119 Type 2 diabetes mellitus without complications: Secondary | ICD-10-CM | POA: Diagnosis not present

## 2016-11-07 DIAGNOSIS — I10 Essential (primary) hypertension: Secondary | ICD-10-CM

## 2016-11-07 DIAGNOSIS — I2510 Atherosclerotic heart disease of native coronary artery without angina pectoris: Secondary | ICD-10-CM

## 2016-11-07 DIAGNOSIS — G4700 Insomnia, unspecified: Secondary | ICD-10-CM

## 2016-11-07 DIAGNOSIS — Z760 Encounter for issue of repeat prescription: Secondary | ICD-10-CM | POA: Diagnosis not present

## 2016-11-07 DIAGNOSIS — E785 Hyperlipidemia, unspecified: Secondary | ICD-10-CM

## 2016-11-07 LAB — TSH: TSH: 0.97 u[IU]/mL (ref 0.35–4.50)

## 2016-11-07 LAB — CBC WITH DIFFERENTIAL/PLATELET
Basophils Absolute: 0 10*3/uL (ref 0.0–0.1)
Basophils Relative: 0.4 % (ref 0.0–3.0)
EOS PCT: 1.8 % (ref 0.0–5.0)
Eosinophils Absolute: 0.1 10*3/uL (ref 0.0–0.7)
HCT: 44.6 % (ref 36.0–46.0)
HEMOGLOBIN: 14.8 g/dL (ref 12.0–15.0)
LYMPHS ABS: 1.8 10*3/uL (ref 0.7–4.0)
Lymphocytes Relative: 33.1 % (ref 12.0–46.0)
MCHC: 33.2 g/dL (ref 30.0–36.0)
MCV: 91.8 fl (ref 78.0–100.0)
MONO ABS: 0.4 10*3/uL (ref 0.1–1.0)
Monocytes Relative: 7.4 % (ref 3.0–12.0)
Neutro Abs: 3 10*3/uL (ref 1.4–7.7)
Neutrophils Relative %: 57.3 % (ref 43.0–77.0)
Platelets: 255 10*3/uL (ref 150.0–400.0)
RBC: 4.86 Mil/uL (ref 3.87–5.11)
RDW: 13.1 % (ref 11.5–15.5)
WBC: 5.3 10*3/uL (ref 4.0–10.5)

## 2016-11-07 LAB — HEPATIC FUNCTION PANEL
ALK PHOS: 106 U/L (ref 39–117)
ALT: 29 U/L (ref 0–35)
AST: 21 U/L (ref 0–37)
Albumin: 4.4 g/dL (ref 3.5–5.2)
Bilirubin, Direct: 0.1 mg/dL (ref 0.0–0.3)
TOTAL PROTEIN: 6.4 g/dL (ref 6.0–8.3)
Total Bilirubin: 0.5 mg/dL (ref 0.2–1.2)

## 2016-11-07 LAB — BASIC METABOLIC PANEL
BUN: 19 mg/dL (ref 6–23)
CO2: 29 mEq/L (ref 19–32)
Calcium: 9.7 mg/dL (ref 8.4–10.5)
Chloride: 104 mEq/L (ref 96–112)
Creatinine, Ser: 0.88 mg/dL (ref 0.40–1.20)
GFR: 67.03 mL/min (ref >60.00)
GLUCOSE: 136 mg/dL — AB (ref 70–99)
POTASSIUM: 4.2 meq/L (ref 3.5–5.1)
Sodium: 139 mEq/L (ref 135–145)

## 2016-11-07 LAB — LIPID PANEL
CHOL/HDL RATIO: 3
Cholesterol: 171 mg/dL (ref 0–200)
HDL: 56.5 mg/dL (ref >39.00)
LDL CALC: 97 mg/dL (ref 0–99)
NonHDL: 114.53
Triglycerides: 86 mg/dL (ref 0.0–149.0)
VLDL: 17.2 mg/dL (ref 0.0–40.0)

## 2016-11-07 LAB — VITAMIN D 25 HYDROXY (VIT D DEFICIENCY, FRACTURES): VITD: 63.56 ng/mL (ref 30.00–100.00)

## 2016-11-07 LAB — HEMOGLOBIN A1C: HEMOGLOBIN A1C: 7 % — AB (ref 4.6–6.5)

## 2016-11-07 MED ORDER — CANAGLIFLOZIN 100 MG PO TABS: 100.0000 mg | ORAL_TABLET | Freq: Every day | ORAL | 1 refills | Status: DC

## 2016-11-07 MED ORDER — NITROGLYCERIN 0.4 MG SL SUBL
0.4000 mg | SUBLINGUAL_TABLET | SUBLINGUAL | 3 refills | Status: DC | PRN
Start: 2016-11-07 — End: 2017-06-27

## 2016-11-07 MED ORDER — TRAZODONE HCL 50 MG PO TABS: 25.0000 mg | ORAL_TABLET | Freq: Every evening | ORAL | 1 refills | Status: DC | PRN

## 2016-11-07 MED ORDER — ACCU-CHEK FASTCLIX LANCETS MISC
3 refills | Status: DC
Start: 2016-11-07 — End: 2018-01-28

## 2016-11-07 MED ORDER — ATORVASTATIN CALCIUM 80 MG PO TABS: 80.0000 mg | ORAL_TABLET | Freq: Every day | ORAL | 3 refills | Status: DC

## 2016-11-07 MED ORDER — ESTROGENS, CONJUGATED 0.625 MG/GM VA CREA: 1.0000 g | TOPICAL_CREAM | VAGINAL | 6 refills | Status: DC

## 2016-11-07 MED ORDER — ATORVASTATIN CALCIUM 80 MG PO TABS: 80.0000 mg | ORAL_TABLET | Freq: Every day | ORAL | 0 refills | Status: DC

## 2016-11-07 MED ORDER — GLUCOSE BLOOD VI STRP: ORAL_STRIP | 3 refills | Status: DC

## 2016-11-07 MED ORDER — POTASSIUM CHLORIDE CRYS ER 20 MEQ PO TBCR: 20.0000 meq | EXTENDED_RELEASE_TABLET | Freq: Two times a day (BID) | ORAL | 1 refills | Status: DC

## 2016-11-07 MED ORDER — FUROSEMIDE 20 MG PO TABS: 20.0000 mg | ORAL_TABLET | Freq: Two times a day (BID) | ORAL | 0 refills | Status: DC

## 2016-11-07 MED ORDER — ALBUTEROL SULFATE HFA 108 (90 BASE) MCG/ACT IN AERS: 2.0000 | INHALATION_SPRAY | Freq: Four times a day (QID) | RESPIRATORY_TRACT | 0 refills | Status: DC | PRN

## 2016-11-07 NOTE — Progress Notes (Signed)
Subjective:    Patient ID: Rebecca [name], female    DOB: Jun 15, 1946, 71 y.o.   MRN: [id number]  HPI  Patient presents for yearly follow up exam. She  has a past medical history of Asthma; CAD (coronary artery disease); Chest pain, atypical; Edema; Fatigue; Fatigue; GERD (gastroesophageal reflux disease); Hyperlipidemia; Hypertension; Hypothyroidism; IBS (irritable bowel syndrome); Keratosis, seborrheic; Labyrinthitis; MI (myocardial infarction); Osteoarthritis; and Skin cancer.  All immunizations and health maintenance protocols were reviewed with the patient and needed orders were placed.  Appropriate screening laboratory values were ordered for the patient including screening of hyperlipidemia, renal function and hepatic function.  Medication reconciliation,  past medical history, social history, problem list and allergies were reviewed in detail with the patient  Goals were established with regard to weight loss, exercise, and  diet in compliance with medications. She stays active and tries to eat a diabetic diet   End of life planning was discussed. She has an advanced directive and living will.  She takes Invokana 100mg  daily to control her diabetes. She has had blood sugar readings between 104 - 160's.   She takes lipitor 80 mg for hyperlipidemia   She takes Cardizem for hypertension   She has an upcoming appointment for mammogram. She is up to date on her vision and dental screens as well as colonoscopy.   She denies any interval history   Her only acute complaint is that of chronic insomnia. She reports that she is having more trouble " turning off her mind at night." She is having trouble staying asleep. She reports no issues with waking up in the middle of the night gasping for breath. She has tried melatonin and Ambien in the past and found little benefit from melatonin and did not like the way Ambien made her feel.   Review of Systems  Constitutional: Negative.   HENT:  Negative.   Eyes: Negative.   Respiratory: Negative.   Cardiovascular: Negative.   Gastrointestinal: Negative.   Endocrine: Negative.   Genitourinary: Negative.   Musculoskeletal: Negative.   Skin: Negative.   Allergic/Immunologic: Negative.   Neurological: Negative.   Hematological: Negative.   Psychiatric/Behavioral: Positive for sleep disturbance.  All other systems reviewed and are negative.  Past Medical History:  Diagnosis Date  . Asthma    extrinsic  . CAD (coronary artery disease)   . Chest pain, atypical   . Diabetes ([place])   . Edema   . Fatigue   . Fatigue   . GERD (gastroesophageal reflux disease)   . Hyperlipidemia   . Hypertension    familiar  . Hypothyroidism   . IBS (irritable bowel syndrome)    hx  . Keratosis, seborrheic   . Labyrinthitis   . MI (myocardial infarction) ([place])    age 58  . Osteoarthritis   . Skin cancer    Melanoma     Social History   Social History  . Marital status: Married    Spouse name: N/A  . Number of children: 2  . Years of education: N/A   Occupational History  . RN-retired Retired   Social History Main Topics  . Smoking status: Never Smoker  . Smokeless tobacco: Not on file  . Alcohol use No     Comment: rarely  . Drug use: No  . Sexual activity: Not on file   Other Topics Concern  . Not on file   Social History Narrative   ** Merged History Encounter **  Past Surgical History:  Procedure Laterality Date  . Breast Biopsy     dense breast tissue  . CHOLECYSTECTOMY    . EUS  04/04/2012   Procedure: UPPER ENDOSCOPIC ULTRASOUND (EUS) LINEAR;  Surgeon: Milus Banister, MD;  Location: [facility];  Service: Endoscopy;  Laterality: N/A;  radial linear  . KNEE ARTHROPLASTY     total right  02-28-2010 Dr Pilar Plate Aluisio, left  . TUBAL LIGATION      Family History  Problem Relation Age of Onset  . Melanoma Daughter 32    METASTATIC  . Heart disease Mother   . Breast cancer Sister   . Hypertension  Father   . Hypertension Mother   . Hypertension Sister   . Heart attack Mother 34    MULTIPLES OVER TIME    Allergies  Allergen Reactions  . Sulfa Antibiotics   . Sulfonamide Derivatives     REACTION: Kathreen Cosier syndrome    Current Outpatient Prescriptions on File Prior to Visit  Medication Sig Dispense Refill  . ACCU-CHEK FASTCLIX LANCETS MISC USE   TO CHECK GLUCOSE TWICE DAILY 102 each 5  . ACCU-CHEK FASTCLIX LANCETS MISC Test 3 times daily. 102 each 5  . acetaminophen (TYLENOL) 325 MG tablet Take 650 mg by mouth every 6 (six) hours as needed.      Marland Kitchen albuterol (PROVENTIL HFA;VENTOLIN HFA) 108 (90 BASE) MCG/ACT inhaler Inhale 2 puffs into the lungs every 6 (six) hours as needed for wheezing or shortness of breath. 1 Inhaler 0  . albuterol (PROVENTIL) (2.5 MG/3ML) 0.083% nebulizer solution Take 3 mLs (2.5 mg total) by nebulization every 6 (six) hours as needed for wheezing or shortness of breath. 150 mL 1  . aspirin 325 MG tablet Take 325 mg by mouth daily.      Marland Kitchen atorvastatin (LIPITOR) 80 MG tablet Take 1 tablet by mouth  daily 90 tablet 0  . benzonatate (TESSALON) 200 MG capsule Take 1 capsule (200 mg total) by mouth 3 (three) times daily as needed for cough. 20 capsule 1  . calcium carbonate (OS-CAL) 600 MG TABS Take 1,200 mg by mouth daily.     . canagliflozin (INVOKANA) 100 MG TABS tablet Take 1 tablet (100 mg total) by mouth daily before breakfast. 90 tablet 1  . Cholecalciferol (VITAMIN D) 2000 UNITS CAPS Take 1 capsule by mouth daily.      . ciprofloxacin (CIPRO) 500 MG tablet Take 1 tablet (500 mg total) by mouth 2 (two) times daily as needed. 30 tablet 0  . conjugated estrogens (PREMARIN) vaginal cream Place 0.5 Applicatorfuls vaginally every 7 (seven) days. 42.5 g 6  . diltiazem (CARDIZEM CD) 180 MG 24 hr capsule Take 1 capsule (180 mg total) by mouth daily. 90 capsule 1  . diphenoxylate-atropine (LOMOTIL) 2.5-0.025 MG per tablet Take 1 tablet by mouth 4 (four) times  daily as needed for diarrhea or loose stools. 30 tablet 0  . doxycycline (VIBRAMYCIN) 100 MG capsule Take 1 capsule (100 mg total) by mouth 2 (two) times daily. 20 capsule 0  . fluticasone (FLOVENT HFA) 110 MCG/ACT inhaler Inhale 2 puffs into the lungs daily. 1 Inhaler 12  . furosemide (LASIX) 20 MG tablet Take 1 tablet (20 mg total) by mouth 2 (two) times daily. You may increase it to 40 mg on days when your lower extremity edema is worse. 270 tablet 3  . furosemide (LASIX) 20 MG tablet Take 1 tablet by mouth two  times daily 180 tablet 0  .  glucose blood (ACCU-CHEK SMARTVIEW) test strip USE  STRIP TO CHECK GLUCOSE TWICE DAILY 100 each 3  . ibuprofen (ADVIL,MOTRIN) 200 MG tablet Take 600 mg by mouth every 6 (six) hours as needed for pain.    Marland Kitchen levothyroxine (SYNTHROID, LEVOTHROID) 50 MCG tablet Take 1 tablet by mouth  daily before breakfast 90 tablet 3  . Loperamide HCl (IMODIUM A-D PO) Take 1 tablet by mouth as needed.    . metFORMIN (GLUCOPHAGE) 500 MG tablet Take 1/2 tablet by mouth twice daily with meals. 90 tablet 1  . Naphazoline-Polyethyl Glycol (EYE DROPS) 0.012-0.2 % SOLN Apply to eye as needed. Artifical eye drops    . nitroGLYCERIN (NITROSTAT) 0.4 MG SL tablet Place 1 tablet (0.4 mg total) under the tongue every 5 (five) minutes as needed. 25 tablet 3  . Omega-3 Fatty Acids (FISH OIL) 1200 MG CAPS Take by mouth.      . potassium chloride SA (K-DUR,KLOR-CON) 20 MEQ tablet Take 1 tablet by mouth two  times daily 180 tablet 1  . tiZANidine (ZANAFLEX) 4 MG tablet Take 1 tablet (4 mg total) by mouth every 6 (six) hours as needed for muscle spasms. 30 tablet 3   Current Facility-Administered Medications on File Prior to Visit  Medication Dose Route Frequency Provider Last Rate Last Dose  . ipratropium-albuterol (DUONEB) 0.5-2.5 (3) MG/3ML nebulizer solution 3 mL  3 mL Nebulization Once [name], NP        There were no vitals taken for this visit.      Objective:   Physical Exam    Constitutional: She is oriented to person, place, and time. She appears well-developed and well-nourished. No distress.  HENT:  Head: Normocephalic and atraumatic.  Right Ear: External ear normal.  Left Ear: External ear normal.  Nose: Nose normal.  Mouth/Throat: Oropharynx is clear and moist. No oropharyngeal exudate.  Eyes: Conjunctivae and EOM are normal. Pupils are equal, round, and reactive to light. Right eye exhibits no discharge. Left eye exhibits no discharge. No scleral icterus.  Neck: Normal range of motion. Neck supple. No JVD present. No tracheal deviation present. No thyromegaly present.  Cardiovascular: Normal rate, regular rhythm, normal heart sounds and intact distal pulses.  Exam reveals no gallop and no friction rub.   No murmur heard. Pulmonary/Chest: Effort normal and breath sounds normal. No stridor. No respiratory distress. She has no wheezes. She has no rales. She exhibits no tenderness.  Abdominal: Soft. Bowel sounds are normal. She exhibits no distension and no mass. There is no tenderness. There is no rebound and no guarding.  Genitourinary:  Genitourinary Comments: Breast Exam: No masses, lumps, dimpling or discharge noted.   Musculoskeletal: Normal range of motion. She exhibits no edema, tenderness or deformity.  Lymphadenopathy:    She has no cervical adenopathy.  Neurological: She is alert and oriented to person, place, and time. She has normal reflexes. She displays normal reflexes. No cranial nerve deficit. She exhibits normal muscle tone. Coordination normal.  Skin: Skin is warm and dry. No rash noted. No erythema. No pallor.  Psychiatric: She has a normal mood and affect. Her behavior is normal. Judgment and thought content normal.  Nursing note and vitals reviewed.     Assessment & Plan:  1. Essential hypertension - Well controlled on current medication therapy  - Basic metabolic panel - CBC with Differential/Platelet - Hemoglobin A1c - Hepatic  function panel - Lipid panel - TSH  2. Hypothyroidism, unspecified type - Basic metabolic panel - CBC  with Differential/Platelet - Hemoglobin A1c - Hepatic function panel - Lipid panel - TSH - Consider increasing  3. Diabetes mellitus without complication (HCC)  - Basic metabolic panel - CBC with Differential/Platelet - Hemoglobin A1c - Hepatic function panel - Lipid panel - TSH - canagliflozin (INVOKANA) 100 MG TABS tablet; Take 1 tablet (100 mg total) by mouth daily before breakfast.  Dispense: 90 tablet; Refill: 1 - Consider increasing Invokana  4. Hyperlipidemia, unspecified hyperlipidemia type  - Basic metabolic panel - CBC with Differential/Platelet - Hemoglobin A1c - Hepatic function panel - Lipid panel - TSH  5. Vitamin D deficiency  - Vitamin D, 25-hydroxy  6. Need for hepatitis C screening test  - Hep C Antibody  7. Atherosclerosis of native coronary artery of native heart without angina pectoris  - nitroGLYCERIN (NITROSTAT) 0.4 MG SL tablet; Place 1 tablet (0.4 mg total) under the tongue every 5 (five) minutes as needed.  Dispense: 25 tablet; Refill: 3  8. Medication refill  - furosemide (LASIX) 20 MG tablet; Take 1 tablet (20 mg total) by mouth 2 (two) times daily.  Dispense: 180 tablet; Refill: 0 - conjugated estrogens (PREMARIN) vaginal cream; Place 0.5 Applicatorfuls vaginally every 7 (seven) days.  Dispense: 42.5 g; Refill: 6  9. Need for 23-polyvalent pneumococcal polysaccharide vaccine  - Pneumococcal polysaccharide vaccine 23-valent greater than or equal to 2yo subcutaneous/IM  10. Insomnia, unspecified type  - traZODone (DESYREL) 50 MG tablet; Take 0.5-1 tablets (25-50 mg total) by mouth at bedtime as needed for sleep.  Dispense: 30 tablet; Refill: 1 - traZODone (DESYREL) 50 MG tablet; Take 0.5-1 tablets (25-50 mg total) by mouth at bedtime as needed for sleep.  Dispense: 90 tablet; Refill: 1   [name], NP

## 2016-11-07 NOTE — Patient Instructions (Signed)
It was great seeing you today   I will send in Trazodone to help you sleep you can also try Organifi Gold - can be bought on [place]   Follow up in 6 months or sooner if needed

## 2016-11-08 LAB — HEPATITIS C ANTIBODY: HCV Ab: NEGATIVE

## 2016-11-17 ENCOUNTER — Encounter: Payer: Self-pay | Admitting: Adult Health

## 2016-11-17 ENCOUNTER — Other Ambulatory Visit: Payer: Self-pay

## 2016-11-17 DIAGNOSIS — Z760 Encounter for issue of repeat prescription: Secondary | ICD-10-CM

## 2016-11-17 MED ORDER — POTASSIUM CHLORIDE CRYS ER 20 MEQ PO TBCR: 20.0000 meq | EXTENDED_RELEASE_TABLET | Freq: Two times a day (BID) | ORAL | 3 refills | Status: DC

## 2016-11-17 MED ORDER — FUROSEMIDE 20 MG PO TABS: 20.0000 mg | ORAL_TABLET | Freq: Two times a day (BID) | ORAL | 3 refills | Status: DC

## 2016-12-05 ENCOUNTER — Ambulatory Visit
Admission: RE | Admit: 2016-12-05 | Discharge: 2016-12-05 | Disposition: A | Discharge status: Home / Self Care | Payer: Medicare Other | Source: Ambulatory Visit | Attending: Adult Health | Admitting: Adult Health

## 2016-12-05 DIAGNOSIS — Z1231 Encounter for screening mammogram for malignant neoplasm of breast: Secondary | ICD-10-CM

## 2016-12-19 DIAGNOSIS — Z85820 Personal history of malignant melanoma of skin: Secondary | ICD-10-CM | POA: Diagnosis not present

## 2016-12-19 DIAGNOSIS — Z86018 Personal history of other benign neoplasm: Secondary | ICD-10-CM | POA: Diagnosis not present

## 2016-12-19 DIAGNOSIS — Z85828 Personal history of other malignant neoplasm of skin: Secondary | ICD-10-CM | POA: Diagnosis not present

## 2016-12-19 DIAGNOSIS — D1800 Hemangioma unspecified site: Secondary | ICD-10-CM | POA: Diagnosis not present

## 2016-12-19 DIAGNOSIS — D225 Melanocytic nevi of trunk: Secondary | ICD-10-CM | POA: Diagnosis not present

## 2016-12-19 DIAGNOSIS — L814 Other melanin hyperpigmentation: Secondary | ICD-10-CM | POA: Diagnosis not present

## 2016-12-30 ENCOUNTER — Other Ambulatory Visit: Payer: Self-pay | Admitting: Adult Health

## 2017-01-08 ENCOUNTER — Encounter: Payer: Self-pay | Admitting: Cardiology

## 2017-01-12 ENCOUNTER — Encounter: Payer: Self-pay | Admitting: Adult Health

## 2017-02-02 DIAGNOSIS — H26493 Other secondary cataract, bilateral: Secondary | ICD-10-CM | POA: Diagnosis not present

## 2017-02-02 DIAGNOSIS — H524 Presbyopia: Secondary | ICD-10-CM | POA: Diagnosis not present

## 2017-02-02 DIAGNOSIS — E119 Type 2 diabetes mellitus without complications: Secondary | ICD-10-CM | POA: Diagnosis not present

## 2017-02-02 DIAGNOSIS — H04123 Dry eye syndrome of bilateral lacrimal glands: Secondary | ICD-10-CM | POA: Diagnosis not present

## 2017-02-02 LAB — HM DIABETES EYE EXAM

## 2017-02-06 ENCOUNTER — Ambulatory Visit (INDEPENDENT_AMBULATORY_CARE_PROVIDER_SITE_OTHER): Payer: Medicare Other | Admitting: Adult Health

## 2017-02-06 ENCOUNTER — Encounter: Payer: Self-pay | Admitting: Family Medicine

## 2017-02-06 VITALS — BP 134/78 | HR 83 | Temp 97.9°F | Ht <= 58 in | Wt 146.2 lb

## 2017-02-06 DIAGNOSIS — G4700 Insomnia, unspecified: Secondary | ICD-10-CM

## 2017-02-06 DIAGNOSIS — I2510 Atherosclerotic heart disease of native coronary artery without angina pectoris: Secondary | ICD-10-CM

## 2017-02-06 DIAGNOSIS — Z760 Encounter for issue of repeat prescription: Secondary | ICD-10-CM

## 2017-02-06 DIAGNOSIS — E119 Type 2 diabetes mellitus without complications: Secondary | ICD-10-CM | POA: Diagnosis not present

## 2017-02-06 LAB — POCT GLYCOSYLATED HEMOGLOBIN (HGB A1C): HEMOGLOBIN A1C: 6.5

## 2017-02-06 MED ORDER — LEVOTHYROXINE SODIUM 50 MCG PO TABS: ORAL_TABLET | ORAL | 2 refills | Status: DC

## 2017-02-06 MED ORDER — TRAZODONE HCL 50 MG PO TABS: 25.0000 mg | ORAL_TABLET | Freq: Every evening | ORAL | 2 refills | Status: DC | PRN

## 2017-02-06 MED ORDER — FUROSEMIDE 20 MG PO TABS: 20.0000 mg | ORAL_TABLET | Freq: Two times a day (BID) | ORAL | 3 refills | Status: DC

## 2017-02-06 NOTE — Progress Notes (Signed)
Subjective:    Patient ID: Rebecca [name], female    DOB: 1945/10/04, 71 y.o.   MRN: [id number]  HPI  71 year old female who  has a past medical history of Asthma; CAD (coronary artery disease); Chest pain, atypical; Diabetes ([place]); Edema; Fatigue; Fatigue; GERD (gastroesophageal reflux disease); Hyperlipidemia; Hypertension; Hypothyroidism; IBS (irritable bowel syndrome); Keratosis, seborrheic; Labyrinthitis; MI (myocardial infarction) ([place]); Osteoarthritis; and Skin cancer.  She presents to the office today for three month follow up regarding diabetes. During her last visit she was switched from Metformin to Invokana 1000 mg due to side effects from Metformin. At this time, her A1c was 7.0   She reports that her blood sugars have been varying from 70's - 190's.   She denies any side effects of Invokana   Review of Systems See HPI   Past Medical History:  Diagnosis Date  . Asthma    extrinsic  . CAD (coronary artery disease)   . Chest pain, atypical   . Diabetes ([place])   . Edema   . Fatigue   . Fatigue   . GERD (gastroesophageal reflux disease)   . Hyperlipidemia   . Hypertension    familiar  . Hypothyroidism   . IBS (irritable bowel syndrome)    hx  . Keratosis, seborrheic   . Labyrinthitis   . MI (myocardial infarction) ([place])    age 51  . Osteoarthritis   . Skin cancer    Melanoma     Social History   Social History  . Marital status: Married    Spouse name: N/A  . Number of children: 2  . Years of education: N/A   Occupational History  . RN-retired Retired   Social History Main Topics  . Smoking status: Never Smoker  . Smokeless tobacco: Not on file  . Alcohol use No     Comment: rarely  . Drug use: No  . Sexual activity: Not on file   Other Topics Concern  . Not on file   Social History Narrative   ** Merged History Encounter **        Past Surgical History:  Procedure Laterality Date  . Breast Biopsy     dense breast tissue  . BREAST  EXCISIONAL BIOPSY Left 2000   benign  . CHOLECYSTECTOMY    . EUS  04/04/2012   Procedure: UPPER ENDOSCOPIC ULTRASOUND (EUS) LINEAR;  Surgeon: Milus Banister, MD;  Location: [facility];  Service: Endoscopy;  Laterality: N/A;  radial linear  . KNEE ARTHROPLASTY     total right  02-28-2010 Dr Pilar Plate Aluisio, left  . TUBAL LIGATION      Family History  Problem Relation Age of Onset  . Heart disease Mother   . Hypertension Mother   . Heart attack Mother 48       MULTIPLES OVER TIME  . Melanoma Daughter 68       METASTATIC  . Breast cancer Sister   . Hypertension Father   . Hypertension Sister     Allergies  Allergen Reactions  . Sulfa Antibiotics   . Sulfonamide Derivatives     REACTION: Kathreen Cosier syndrome    Current Outpatient Prescriptions on File Prior to Visit  Medication Sig Dispense Refill  . ACCU-CHEK FASTCLIX LANCETS MISC CHECK GLUCOSE 3 TIMES DAILY 100 each 3  . acetaminophen (TYLENOL) 325 MG tablet Take 650 mg by mouth every 6 (six) hours as needed.      Marland Kitchen albuterol (PROVENTIL HFA;VENTOLIN HFA) 108 (90  Base) MCG/ACT inhaler Inhale 2 puffs into the lungs every 6 (six) hours as needed for wheezing or shortness of breath. 1 Inhaler 0  . albuterol (PROVENTIL) (2.5 MG/3ML) 0.083% nebulizer solution Take 3 mLs (2.5 mg total) by nebulization every 6 (six) hours as needed for wheezing or shortness of breath. 150 mL 1  . aspirin 325 MG tablet Take 325 mg by mouth daily.      Marland Kitchen atorvastatin (LIPITOR) 80 MG tablet Take 1 tablet (80 mg total) by mouth daily. 90 tablet 3  . calcium carbonate (OS-CAL) 600 MG TABS Take 1,200 mg by mouth daily.     . canagliflozin (INVOKANA) 100 MG TABS tablet Take 1 tablet (100 mg total) by mouth daily before breakfast. 90 tablet 1  . Cholecalciferol (VITAMIN D) 2000 UNITS CAPS Take 1 capsule by mouth daily.      Marland Kitchen conjugated estrogens (PREMARIN) vaginal cream Place 0.5 Applicatorfuls vaginally every 7 (seven) days. 42.5 g 6  . diltiazem  (CARDIZEM CD) 180 MG 24 hr capsule Take 1 capsule (180 mg total) by mouth daily. 90 capsule 1  . diphenoxylate-atropine (LOMOTIL) 2.5-0.025 MG per tablet Take 1 tablet by mouth 4 (four) times daily as needed for diarrhea or loose stools. 30 tablet 0  . fluticasone (FLOVENT HFA) 110 MCG/ACT inhaler Inhale 2 puffs into the lungs daily. 1 Inhaler 12  . glucose blood (ACCU-CHEK SMARTVIEW) test strip CHECK GLUCOSE THREE TIMES DAILY 100 each 3  . ibuprofen (ADVIL,MOTRIN) 200 MG tablet Take 600 mg by mouth every 6 (six) hours as needed for pain.    . Naphazoline-Polyethyl Glycol (EYE DROPS) 0.012-0.2 % SOLN Apply to eye as needed. Artifical eye drops    . nitroGLYCERIN (NITROSTAT) 0.4 MG SL tablet Place 1 tablet (0.4 mg total) under the tongue every 5 (five) minutes as needed. 25 tablet 3  . Omega-3 Fatty Acids (FISH OIL) 1200 MG CAPS Take by mouth.      . potassium chloride SA (K-DUR,KLOR-CON) 20 MEQ tablet Take 1 tablet (20 mEq total) by mouth 2 (two) times daily. 180 tablet 3  . tiZANidine (ZANAFLEX) 4 MG tablet Take 1 tablet (4 mg total) by mouth every 6 (six) hours as needed for muscle spasms. 30 tablet 3   No current facility-administered medications on file prior to visit.     BP 134/78 (BP Location: Left Arm, Patient Position: Sitting, Cuff Size: Normal)   Pulse 83   Temp 97.9 F (36.6 C) (Oral)   Ht 4\' 10"  (1.473 m)   Wt 146 lb 3.2 oz (66.3 kg)   SpO2 97%   BMI 30.56 kg/m       Objective:   Physical Exam  Constitutional: She is oriented to person, place, and time. She appears well-developed and well-nourished. No distress.  Neurological: She is alert and oriented to person, place, and time.  Skin: Skin is warm and dry. No rash noted. She is not diaphoretic. No erythema. No pallor.  Psychiatric: She has a normal mood and affect. Her behavior is normal. Judgment and thought content normal.  Nursing note and vitals reviewed.     Assessment & Plan:  1. Insomnia, unspecified  type  - traZODone (DESYREL) 50 MG tablet; Take 0.5-1 tablets (25-50 mg total) by mouth at bedtime as needed for sleep.  Dispense: 90 tablet; Refill: 2  2. Medication refill  - furosemide (LASIX) 20 MG tablet; Take 1 tablet (20 mg total) by mouth 2 (two) times daily.  Dispense: 180 tablet; Refill: 3  3. Diabetes mellitus without complication ([place])  - POCT glycosylated hemoglobin (Hb A1C)- 6.5 - has improved - Continue with current dose of Invokana - Follow up in 3 months   [name], NP

## 2017-02-08 ENCOUNTER — Other Ambulatory Visit: Payer: Self-pay | Admitting: Adult Health

## 2017-02-08 DIAGNOSIS — I2510 Atherosclerotic heart disease of native coronary artery without angina pectoris: Secondary | ICD-10-CM

## 2017-02-09 NOTE — Telephone Encounter (Signed)
Sent to the pharmacy by e-scribe. 

## 2017-02-17 ENCOUNTER — Ambulatory Visit (INDEPENDENT_AMBULATORY_CARE_PROVIDER_SITE_OTHER): Payer: Medicare Other | Admitting: Internal Medicine

## 2017-02-17 ENCOUNTER — Encounter: Payer: Self-pay | Admitting: Internal Medicine

## 2017-02-17 VITALS — BP 140/70 | HR 69 | Temp 97.8°F | Ht <= 58 in | Wt 148.0 lb

## 2017-02-17 DIAGNOSIS — R351 Nocturia: Secondary | ICD-10-CM

## 2017-02-17 DIAGNOSIS — R197 Diarrhea, unspecified: Secondary | ICD-10-CM | POA: Diagnosis not present

## 2017-02-17 DIAGNOSIS — R300 Dysuria: Secondary | ICD-10-CM

## 2017-02-17 DIAGNOSIS — I2510 Atherosclerotic heart disease of native coronary artery without angina pectoris: Secondary | ICD-10-CM

## 2017-02-17 LAB — POCT URINALYSIS DIPSTICK
Bilirubin, UA: NEGATIVE
Blood, UA: NEGATIVE
GLUCOSE UA: 2
KETONES UA: NEGATIVE
Leukocytes, UA: NEGATIVE
Nitrite, UA: NEGATIVE
PROTEIN UA: NEGATIVE
SPEC GRAV UA: 1.015 (ref 1.010–1.025)
Urobilinogen, UA: 0.2 E.U./dL
pH, UA: 6 (ref 5.0–8.0)

## 2017-02-17 MED ORDER — FLUCONAZOLE 150 MG PO TABS: 150.0000 mg | ORAL_TABLET | Freq: Once | ORAL | 1 refills | Status: AC

## 2017-02-17 NOTE — Assessment & Plan Note (Signed)
May be early infection but looks fine Discussed keeping up with fluids Further evaluation if worsens (no recent antibiotic)

## 2017-02-17 NOTE — Progress Notes (Signed)
Subjective:    Patient ID: Rebecca [name], female    DOB: 1945/08/14, 71 y.o.   MRN: [id number]  HPI Here due to urinary symptoms and diarrhea  About 4 days ago, noted genital irritation and cheesy irritation By next day, she new she had yeast infection Trying miconazole cream and suppositories---some improvement  Some urinary symptoms x 2 days--not bad like in the past (?external irritation) Some discomfort and urgency--but not bad No blood  Loose stools this AM---4 total Usually goes daily No abdominal pain  Current Outpatient Prescriptions on File Prior to Visit  Medication Sig Dispense Refill  . ACCU-CHEK FASTCLIX LANCETS MISC CHECK GLUCOSE 3 TIMES DAILY 100 each 3  . acetaminophen (TYLENOL) 325 MG tablet Take 650 mg by mouth every 6 (six) hours as needed.      Marland Kitchen albuterol (PROVENTIL HFA;VENTOLIN HFA) 108 (90 Base) MCG/ACT inhaler Inhale 2 puffs into the lungs every 6 (six) hours as needed for wheezing or shortness of breath. 1 Inhaler 0  . albuterol (PROVENTIL) (2.5 MG/3ML) 0.083% nebulizer solution Take 3 mLs (2.5 mg total) by nebulization every 6 (six) hours as needed for wheezing or shortness of breath. 150 mL 1  . aspirin 325 MG tablet Take 325 mg by mouth daily.      Marland Kitchen atorvastatin (LIPITOR) 80 MG tablet Take 1 tablet (80 mg total) by mouth daily. 90 tablet 3  . calcium carbonate (OS-CAL) 600 MG TABS Take 1,200 mg by mouth daily.     . canagliflozin (INVOKANA) 100 MG TABS tablet Take 1 tablet (100 mg total) by mouth daily before breakfast. 90 tablet 1  . Cholecalciferol (VITAMIN D) 2000 UNITS CAPS Take 1 capsule by mouth daily.      Marland Kitchen conjugated estrogens (PREMARIN) vaginal cream Place 0.5 Applicatorfuls vaginally every 7 (seven) days. 42.5 g 6  . diltiazem (CARDIZEM CD) 180 MG 24 hr capsule TAKE 1 CAPSULE BY MOUTH  DAILY 90 capsule 1  . diphenoxylate-atropine (LOMOTIL) 2.5-0.025 MG per tablet Take 1 tablet by mouth 4 (four) times daily as needed for diarrhea or loose  stools. 30 tablet 0  . fluticasone (FLOVENT HFA) 110 MCG/ACT inhaler Inhale 2 puffs into the lungs daily. 1 Inhaler 12  . furosemide (LASIX) 20 MG tablet Take 1 tablet (20 mg total) by mouth 2 (two) times daily. 180 tablet 3  . glucose blood (ACCU-CHEK SMARTVIEW) test strip CHECK GLUCOSE THREE TIMES DAILY 100 each 3  . ibuprofen (ADVIL,MOTRIN) 200 MG tablet Take 600 mg by mouth every 6 (six) hours as needed for pain.    Marland Kitchen levothyroxine (SYNTHROID, LEVOTHROID) 50 MCG tablet TAKE 1 TABLET BY MOUTH  DAILY BEFORE BREAKFAST 90 tablet 2  . Naphazoline-Polyethyl Glycol (EYE DROPS) 0.012-0.2 % SOLN Apply to eye as needed. Artifical eye drops    . nitroGLYCERIN (NITROSTAT) 0.4 MG SL tablet Place 1 tablet (0.4 mg total) under the tongue every 5 (five) minutes as needed. 25 tablet 3  . Omega-3 Fatty Acids (FISH OIL) 1200 MG CAPS Take by mouth.      . potassium chloride SA (K-DUR,KLOR-CON) 20 MEQ tablet Take 1 tablet (20 mEq total) by mouth 2 (two) times daily. 180 tablet 3  . tiZANidine (ZANAFLEX) 4 MG tablet Take 1 tablet (4 mg total) by mouth every 6 (six) hours as needed for muscle spasms. 30 tablet 3  . traZODone (DESYREL) 50 MG tablet Take 0.5-1 tablets (25-50 mg total) by mouth at bedtime as needed for sleep. 90 tablet 2   No  current facility-administered medications on file prior to visit.     Allergies  Allergen Reactions  . Sulfa Antibiotics   . Sulfonamide Derivatives     REACTION: Kathreen Cosier syndrome    Past Medical History:  Diagnosis Date  . Asthma    extrinsic  . CAD (coronary artery disease)   . Chest pain, atypical   . Diabetes ([place])   . Edema   . Fatigue   . Fatigue   . GERD (gastroesophageal reflux disease)   . Hyperlipidemia   . Hypertension    familiar  . Hypothyroidism   . IBS (irritable bowel syndrome)    hx  . Keratosis, seborrheic   . Labyrinthitis   . MI (myocardial infarction) ([place])    age 46  . Osteoarthritis   . Skin cancer    Melanoma     Past  Surgical History:  Procedure Laterality Date  . Breast Biopsy     dense breast tissue  . BREAST EXCISIONAL BIOPSY Left 2000   benign  . CHOLECYSTECTOMY    . EUS  04/04/2012   Procedure: UPPER ENDOSCOPIC ULTRASOUND (EUS) LINEAR;  Surgeon: Milus Banister, MD;  Location: [facility];  Service: Endoscopy;  Laterality: N/A;  radial linear  . KNEE ARTHROPLASTY     total right  02-28-2010 Dr Pilar Plate Aluisio, left  . TUBAL LIGATION      Family History  Problem Relation Age of Onset  . Heart disease Mother   . Hypertension Mother   . Heart attack Mother 8       MULTIPLES OVER TIME  . Melanoma Daughter 59       METASTATIC  . Breast cancer Sister   . Hypertension Father   . Hypertension Sister     Social History   Social History  . Marital status: Married    Spouse name: N/A  . Number of children: 2  . Years of education: N/A   Occupational History  . RN-retired Retired   Social History Main Topics  . Smoking status: Never Smoker  . Smokeless tobacco: Never Used  . Alcohol use No     Comment: rarely  . Drug use: No  . Sexual activity: Not on file   Other Topics Concern  . Not on file   Social History Narrative   ** Merged History Encounter **       Review of Systems  Appetite is off No nausea or vomiting Sugar 135 this morning     Objective:   Physical Exam  Pulmonary/Chest: Effort normal and breath sounds normal. No respiratory distress. She has no wheezes. She has no rales.  Abdominal: Soft. Bowel sounds are normal. She exhibits no distension. There is no tenderness. There is no rebound and no guarding.          Assessment & Plan:

## 2017-02-17 NOTE — Assessment & Plan Note (Signed)
Urinalysis benign Probably pain from the vaginal yeast Discussed that Invokana probably implicated---she knew Will give Rx for fluconazole

## 2017-02-22 DIAGNOSIS — H26492 Other secondary cataract, left eye: Secondary | ICD-10-CM | POA: Diagnosis not present

## 2017-03-01 DIAGNOSIS — H26491 Other secondary cataract, right eye: Secondary | ICD-10-CM | POA: Diagnosis not present

## 2017-03-05 ENCOUNTER — Ambulatory Visit: Payer: Medicare Other | Admitting: Cardiology

## 2017-03-14 DIAGNOSIS — Z23 Encounter for immunization: Secondary | ICD-10-CM | POA: Diagnosis not present

## 2017-04-06 ENCOUNTER — Encounter: Payer: Self-pay | Admitting: Adult Health

## 2017-04-18 ENCOUNTER — Encounter: Payer: Self-pay | Admitting: Cardiology

## 2017-04-18 ENCOUNTER — Encounter: Payer: Self-pay | Admitting: *Deleted

## 2017-04-18 ENCOUNTER — Ambulatory Visit (INDEPENDENT_AMBULATORY_CARE_PROVIDER_SITE_OTHER): Payer: Medicare Other | Admitting: Cardiology

## 2017-04-18 VITALS — BP 124/74 | HR 64 | Ht <= 58 in | Wt 146.0 lb

## 2017-04-18 DIAGNOSIS — I2510 Atherosclerotic heart disease of native coronary artery without angina pectoris: Secondary | ICD-10-CM

## 2017-04-18 DIAGNOSIS — E785 Hyperlipidemia, unspecified: Secondary | ICD-10-CM

## 2017-04-18 DIAGNOSIS — I10 Essential (primary) hypertension: Secondary | ICD-10-CM

## 2017-04-18 DIAGNOSIS — Z01812 Encounter for preprocedural laboratory examination: Secondary | ICD-10-CM

## 2017-04-18 DIAGNOSIS — I2583 Coronary atherosclerosis due to lipid rich plaque: Secondary | ICD-10-CM | POA: Diagnosis not present

## 2017-04-18 DIAGNOSIS — R079 Chest pain, unspecified: Secondary | ICD-10-CM | POA: Diagnosis not present

## 2017-04-18 MED ORDER — ASPIRIN EC 81 MG PO TBEC: 81.0000 mg | DELAYED_RELEASE_TABLET | Freq: Every day | ORAL | 3 refills | Status: DC

## 2017-04-18 NOTE — Patient Instructions (Addendum)
Medication Instructions:  Your physician has recommended you make the following change in your medication:  1. DECREASE Aspirin to 81 mg daily  -- If you need a refill on your cardiac medications before your next appointment, please call your pharmacy. --  Labwork: Pre procedure labs today: BMET, CBC w/ diff & INR  Testing/Procedures: Your physician has requested that you have a cardiac catheterization. Cardiac catheterization is used to diagnose and/or treat various heart conditions. Doctors may recommend this procedure for a number of different reasons. The most common reason is to evaluate chest pain. Chest pain can be a symptom of coronary artery disease (CAD), and cardiac catheterization can show whether plaque is narrowing or blocking your heart's arteries. This procedure is also used to evaluate the valves, as well as measure the blood flow and oxygen levels in different parts of your heart. For further information please visit [email or web address]. Please follow instruction sheet, as given.  Follow-Up: Your physician wants you to follow-up in: 6 months with Dr. [name]. You will receive a reminder letter in the mail two months in advance. If you don't receive a letter, please call our office to schedule the follow-up appointment.  Thank you for choosing [facility]!!

## 2017-04-18 NOTE — Progress Notes (Signed)
Patient ID: Rebecca [name], female   DOB: Mar 17, 1946, 71 y.o.   MRN: [id number]    Patient ID: Rebecca [name], female   DOB: Nov 14, 1945, 71 y.o.   MRN: [id number]  Patient Name: Rebecca [name] Date of Encounter: 04/18/2017  Primary Care Provider:  [name], NP Primary Cardiologist:  Ena Dawley  Patient Profile  Chief complain: Chest pain  Problem List   Past Medical History:  Diagnosis Date  . Asthma    extrinsic  . CAD (coronary artery disease)   . Chest pain, atypical   . Diabetes ([place])   . Edema   . Fatigue   . Fatigue   . GERD (gastroesophageal reflux disease)   . Hyperlipidemia   . Hypertension    familiar  . Hypothyroidism   . IBS (irritable bowel syndrome)    hx  . Keratosis, seborrheic   . Labyrinthitis   . MI (myocardial infarction) ([place])    age 25  . Osteoarthritis   . Skin cancer    Melanoma    Past Surgical History:  Procedure Laterality Date  . Breast Biopsy     dense breast tissue  . BREAST EXCISIONAL BIOPSY Left 2000   benign  . CHOLECYSTECTOMY    . EUS  04/04/2012   Procedure: UPPER ENDOSCOPIC ULTRASOUND (EUS) LINEAR;  Surgeon: Milus Banister, MD;  Location: [facility];  Service: Endoscopy;  Laterality: N/A;  radial linear  . KNEE ARTHROPLASTY     total right  02-28-2010 Dr Pilar Plate Aluisio, left  . TUBAL LIGATION      Allergies  Allergies  Allergen Reactions  . Sulfa Antibiotics   . Sulfonamide Derivatives     REACTION: Kathreen Cosier syndrome    Chief complain: Follow up for CAD  HPI  71 year old female with h/o CAD, MI in 1992, cath x 3, last time in 2010 with PCI to the RCA. She has been doing well, travelling a lot recently. Denies any chest pain. She has gain about 13 lbs since the last year. She is complaining of lower extremities edema for which she take double dose of lasix with some relief. No palpitations, syncope.  11/18/2015, patient's coming for 1 year follow-up, she remains very active attending as many as 8-10  glasses of local [employer] per week and experiences no depression or shortness of breath. She states that her symptoms haven't changed. However on 2 occasions when she was shopping she experienced sudden onset chest tightness radiating to her jaw that immediately resolved with sublingual nitroglycerin. She has hard time managing her diabetes she was intolerant to glipizide and metformin and her diabetes is labile despite regular diet and exercise. She has worsening lower extremity edema and now takes 40 mg of Lasix daily. No orthopnea or paroxysmal dyspnea. No palpitations or syncope.   04/18/2017 - this is one year follow-up, the patient has enjoyed these year tremendously, she traveled to [place] and her husband singing group from [facility] that performed in that he can. She has noticed at that trip that she got short of breath easily and on one occasion she had to stay in and she felt fondly tired and had to take 3 nitroglycerin that helped with her symptoms. She remains very active she exercises 6 times a week but has noticed that despite that she remains tired and has symptoms of chest pain that she attributed to GERD however nitroglycerin resolved her symptoms. She still able to do all activities of daily living and exercise she just doesn't  have as much energy and continues to have on and off exertional chest pain. On occasion she will also get diaphoretic with exertion. Denies any dizziness or syncope. Minimal lower extremity edema no orthopnea or proximal nocturnal dyspnea.  Home Medications  Prior to Admission medications   Medication Sig Start Date End Date Taking? Authorizing Provider  acetaminophen (TYLENOL) 325 MG tablet Take 650 mg by mouth every 6 (six) hours as needed.      Historical Provider, MD  albuterol (PROVENTIL HFA) 108 (90 BASE) MCG/ACT inhaler Inhale 2 puffs into the lungs every 6 (six) hours as needed for wheezing. 05/16/12   Dorena Cookey, MD  albuterol (PROVENTIL  HFA;VENTOLIN HFA) 108 (90 BASE) MCG/ACT inhaler Inhale 2 puffs into the lungs every 6 (six) hours as needed for wheezing.    Historical Provider, MD  albuterol (PROVENTIL) (2.5 MG/3ML) 0.083% nebulizer solution Take 3 mLs (2.5 mg total) by nebulization 4 (four) times daily. 05/16/12   Dorena Cookey, MD  aspirin 325 MG tablet Take 325 mg by mouth daily.      Historical Provider, MD  atorvastatin (LIPITOR) 80 MG tablet Take 1 tablet (80 mg total) by mouth daily. 05/16/12   Dorena Cookey, MD  beclomethasone (QVAR) 80 MCG/ACT inhaler Inhale 2 puffs into the lungs 2 (two) times daily. 05/16/12   Dorena Cookey, MD  beclomethasone (QVAR) 80 MCG/ACT inhaler Inhale 1 puff into the lungs 2 (two) times daily as needed. Shortness of breath    Historical Provider, MD  calcium carbonate (OS-CAL) 600 MG TABS Take 600 mg by mouth 2 (two) times daily with a meal.      Historical Provider, MD  calcium carbonate (OS-CAL) 600 MG TABS Take 600 mg by mouth daily.    Historical Provider, MD  cetirizine (ZYRTEC) 10 MG tablet Take 10 mg by mouth daily.      Historical Provider, MD  cetirizine (ZYRTEC) 10 MG tablet Take 10 mg by mouth daily as needed for allergies.    Historical Provider, MD  Cholecalciferol (VITAMIN D) 2000 UNITS CAPS Take 1 capsule by mouth daily.      Historical Provider, MD  conjugated estrogens (PREMARIN) vaginal cream Place 3.32 Applicatorfuls vaginally every 7 (seven) days. weekly 05/16/12   Dorena Cookey, MD  conjugated estrogens (PREMARIN) vaginal cream Place 0.5 g vaginally daily.    Historical Provider, MD  diazepam (VALIUM) 5 MG tablet Take 1 tablet (5 mg total) by mouth every 6 (six) hours as needed for anxiety. 10/17/12   Hoy Morn, MD  diltiazem (CARDIZEM CD) 180 MG 24 hr capsule Take 1 capsule (180 mg total) by mouth daily. 05/16/12   Dorena Cookey, MD  diltiazem (DILACOR XR) 180 MG 24 hr capsule Take 180 mg by mouth daily.    Historical Provider, MD  furosemide (LASIX) 20 MG tablet  Take 1 tablet (20 mg total) by mouth 2 (two) times daily as needed. 05/23/12   Dorena Cookey, MD  HYDROcodone-acetaminophen (VICODIN ES) 7.5-750 MG per tablet One half to one tablet 3 times daily when necessary for pain 07/03/12   Dorena Cookey, MD  hyoscyamine (LEVSIN SL) 0.125 MG SL tablet Place 1 tablet (0.125 mg total) under the tongue every 4 (four) hours as needed. 05/16/12   Dorena Cookey, MD  ibuprofen (ADVIL,MOTRIN) 200 MG tablet Take 600 mg by mouth every 6 (six) hours as needed for pain.    Historical Provider, MD  levothyroxine (SYNTHROID, LEVOTHROID) 50 MCG  tablet Take 1 tablet (50 mcg total) by mouth daily. 05/16/12   Dorena Cookey, MD  levothyroxine (SYNTHROID, LEVOTHROID) 50 MCG tablet Take 1 tablet (50 mcg total) by mouth daily before breakfast. 03/24/13   Dorena Cookey, MD  Loperamide HCl (IMODIUM A-D PO) Take 1 tablet by mouth as needed.    Historical Provider, MD  Meth-Hyo-M Bl-Benz Acd-Ph Sal (HYOPHEN PO) Take 1 tablet by mouth 2 (two) times daily.    Historical Provider, MD  mometasone (NASONEX) 50 MCG/ACT nasal spray Place 2 sprays into the nose as needed. 05/16/12   Dorena Cookey, MD  mometasone (NASONEX) 50 MCG/ACT nasal spray Place 2 sprays into the nose daily as needed. breathing    Historical Provider, MD  montelukast (SINGULAIR) 10 MG tablet Take 1 tablet (10 mg total) by mouth daily. 05/16/12   Dorena Cookey, MD  montelukast (SINGULAIR) 10 MG tablet Take 10 mg by mouth as needed.    Historical Provider, MD  Naphazoline-Polyethyl Glycol (EYE DROPS) 0.012-0.2 % SOLN Apply to eye. Artifical eye drops     Historical Provider, MD  nitrofurantoin, macrocrystal-monohydrate, (MACROBID) 100 MG capsule Take 100 mg by mouth 2 (two) times daily.    Historical Provider, MD  nitroGLYCERIN (NITROSTAT) 0.4 MG SL tablet Place 1 tablet (0.4 mg total) under the tongue every 5 (five) minutes as needed. 05/16/12   Dorena Cookey, MD  Omega-3 Fatty Acids (FISH OIL) 1200 MG CAPS Take by  mouth.      Historical Provider, MD  omeprazole (PRILOSEC) 20 MG capsule Take 1 capsule (20 mg total) by mouth 2 (two) times daily. 05/16/12   Dorena Cookey, MD  oxyCODONE-acetaminophen (PERCOCET/ROXICET) 5-325 MG per tablet Take 1 tablet by mouth every 4 (four) hours as needed for pain. 10/17/12   Hoy Morn, MD  potassium chloride SA (K-DUR,KLOR-CON) 20 MEQ tablet Take 1 tablet (20 mEq total) by mouth daily. 05/16/12   Dorena Cookey, MD  salicylic acid 6 % gel Apply 1 application topically as needed. For feet    Historical Provider, MD  Salicylic Acid-Cleanser 6 % (CREAM) KIT Apply topically.      Historical Provider, MD    Family History  Family History  Problem Relation Age of Onset  . Heart disease Mother   . Hypertension Mother   . Heart attack Mother 45       MULTIPLES OVER TIME  . Melanoma Daughter 60       METASTATIC  . Breast cancer Sister   . Hypertension Father   . Hypertension Sister     Social History  Social History   Social History  . Marital status: Married    Spouse name: N/A  . Number of children: 2  . Years of education: N/A   Occupational History  . RN-retired Retired   Social History Main Topics  . Smoking status: Never Smoker  . Smokeless tobacco: Never Used  . Alcohol use No     Comment: rarely  . Drug use: No  . Sexual activity: Not on file   Other Topics Concern  . Not on file   Social History Narrative   ** Merged History Encounter **         Review of Systems General:  No chills, fever, night sweats or weight changes.  Cardiovascular:  No chest pain, dyspnea on exertion,  + lower exetrmities edema, orthopnea, palpitations, paroxysmal nocturnal dyspnea. Dermatological: No rash, lesions/masses Respiratory: No cough, dyspnea Urologic: No hematuria,  dysuria Abdominal:   No nausea, vomiting, diarrhea, bright red blood per rectum, melena, or hematemesis Neurologic:  No visual changes, wkns, changes in mental status. All other  systems reviewed and are otherwise negative except as noted above.  Physical Exam  Blood pressure 124/74, heart rate 64 weight 146 pounds. General: Pleasant, NAD Psych: Normal affect. Neuro: Alert and oriented X 3. Moves all extremities spontaneously. HEENT: Normal  Neck: Supple without bruits or JVD. Lungs:  Resp regular and unlabored, CTA. Heart: RRR no s3, s4, or murmurs. Abdomen: Soft, non-tender, non-distended, BS + x 4.  Extremities: No clubbing, cyanosis or edema. DP/PT/Radials 2+ and equal bilaterally.  Accessory Clinical Findings  ECG - shows sinus rhythm negative T waves in the inferior and anterolateral leads suggestive of ischemia this findings are new when compared to the prior EKG from last year. This was personally reviewed.   Lipid Panel     Component Value Date/Time   CHOL 171 11/07/2016 0955   TRIG 86.0 11/07/2016 0955   HDL 56.50 11/07/2016 0955   CHOLHDL 3 11/07/2016 0955   VLDL 17.2 11/07/2016 0955   LDLCALC 97 11/07/2016 0955   TTE 04/28/2013  Left ventricle: The cavity size was normal. Wall thickness was normal. The estimated ejection fraction was 60%. Wall motion was normal; there were no regional wall motion abnormalities.  Aortic valve: Structurally normal valve. Cusp separation was normal. Doppler: Transvalvular velocity was within the normal range. There was no stenosis. No regurgitation.  Aorta: Aortic root: The aortic root was normal in size.  Mitral valve: Structurally normal valve. Leaflet separation was normal. Doppler: Transvalvular velocity was within the normal range. There was no evidence for stenosis. No regurgitation.  Left atrium: The atrium was normal in size.  Right ventricle: The cavity size was normal. Systolic function was normal.  Pulmonic valve: The valve appears to be grossly normal. Doppler: No significant regurgitation.  Tricuspid valve: Structurally normal valve. Leaflet separation was normal. Doppler:  Transvalvular velocity was within the normal range. No regurgitation.  Right atrium: The atrium was normal in size.  Pericardium: There was no pericardial effusion.    Assessment & Plan  1. CAD, S/P PCI RCA in 2010, now stable, continue ASA 81 mg QD, atorvastatin 80 mg QD, omega 3 acids, no BB (asthma), she has new exertional chest pain and fatigue, her EKG shows new negative T waves in inferior and anterolateral leads, we will schedule the patient for left cardiac catheterization this Friday, October 5 with Dr. Angelena Form.  2. Hyperlipidemia - lipids at goal on atorvastatin 80 mg daily. It's tolerated well he will continue.  3. Hypertension - controlled   4. New diagnosis of diabetes in 2016 - HbA1c 7.4%cannot tolerate oral diabetes meds, diet and exercise only, hemoglobin and improved to 6.7%.   5. H/o hypothyroidism on Synthroid, TSH normal  6. Lower extremity edema - chronic diastolic CHF - she is well compensated now.   We will obtain BMP, CBC and INR prior to the Friday.  Ena Dawley, MD 04/18/2017, 5:16 PM

## 2017-04-19 ENCOUNTER — Telehealth: Payer: Self-pay

## 2017-04-19 LAB — CBC WITH DIFFERENTIAL/PLATELET
Basophils Absolute: 0 10*3/uL (ref 0.0–0.2)
Basos: 0 %
EOS (ABSOLUTE): 0.1 10*3/uL (ref 0.0–0.4)
Eos: 2 %
Hematocrit: 43.2 % (ref 34.0–46.6)
Hemoglobin: 14.5 g/dL (ref 11.1–15.9)
Immature Grans (Abs): 0 10*3/uL (ref 0.0–0.1)
Immature Granulocytes: 0 %
Lymphocytes Absolute: 2.2 10*3/uL (ref 0.7–3.1)
Lymphs: 37 %
MCH: 30.4 pg (ref 26.6–33.0)
MCHC: 33.6 g/dL (ref 31.5–35.7)
MCV: 91 fL (ref 79–97)
Monocytes Absolute: 0.4 10*3/uL (ref 0.1–0.9)
Monocytes: 8 %
Neutrophils Absolute: 3.1 10*3/uL (ref 1.4–7.0)
Neutrophils: 53 %
Platelets: 264 10*3/uL (ref 150–379)
RBC: 4.77 x10E6/uL (ref 3.77–5.28)
RDW: 13.1 % (ref 12.3–15.4)
WBC: 5.8 10*3/uL (ref 3.4–10.8)

## 2017-04-19 LAB — PROTIME-INR
INR: 0.9 (ref 0.8–1.2)
Prothrombin Time: 9.8 s (ref 9.1–12.0)

## 2017-04-19 LAB — BASIC METABOLIC PANEL
BUN/Creatinine Ratio: 24 (ref 12–28)
BUN: 21 mg/dL (ref 8–27)
CO2: 23 mmol/L (ref 20–29)
Calcium: 10.4 mg/dL — ABNORMAL HIGH (ref 8.7–10.3)
Chloride: 101 mmol/L (ref 96–106)
Creatinine, Ser: 0.87 mg/dL (ref 0.57–1.00)
GFR calc Af Amer: 77 mL/min/{1.73_m2} (ref >59)
GFR calc non Af Amer: 67 mL/min/{1.73_m2} (ref >59)
Glucose: 126 mg/dL — ABNORMAL HIGH (ref 65–99)
Potassium: 4.2 mmol/L (ref 3.5–5.2)
Sodium: 140 mmol/L (ref 134–144)

## 2017-04-19 NOTE — Telephone Encounter (Signed)
Patient contacted pre-catheterization at [facility] scheduled for:  04/20/2017 @ 1330 Verified arrival time and place:  [facility] @ 1130 Confirmed AM meds to be taken pre-cath with sip of water: Take ASA-carvedilol Hold lasix and invokana Confirmed patient has responsible person to drive home post procedure and observe patient for 24 hours:  yes Addl concerns:  none

## 2017-04-20 ENCOUNTER — Ambulatory Visit (HOSPITAL_COMMUNITY)
Admission: RE | Admit: 2017-04-20 | Discharge: 2017-04-20 | Disposition: A | Discharge status: Home / Self Care | Payer: Medicare Other | Source: Ambulatory Visit | Attending: Cardiovascular Disease | Admitting: Cardiovascular Disease

## 2017-04-20 ENCOUNTER — Ambulatory Visit (HOSPITAL_COMMUNITY)
Admission: RE | Disposition: A | Discharge status: Home / Self Care | Payer: Self-pay | Source: Ambulatory Visit | Attending: Cardiovascular Disease

## 2017-04-20 DIAGNOSIS — K219 Gastro-esophageal reflux disease without esophagitis: Secondary | ICD-10-CM | POA: Diagnosis not present

## 2017-04-20 DIAGNOSIS — Z79899 Other long term (current) drug therapy: Secondary | ICD-10-CM | POA: Insufficient documentation

## 2017-04-20 DIAGNOSIS — I25118 Atherosclerotic heart disease of native coronary artery with other forms of angina pectoris: Secondary | ICD-10-CM | POA: Diagnosis not present

## 2017-04-20 DIAGNOSIS — Z7982 Long term (current) use of aspirin: Secondary | ICD-10-CM | POA: Diagnosis not present

## 2017-04-20 DIAGNOSIS — Z955 Presence of coronary angioplasty implant and graft: Secondary | ICD-10-CM | POA: Diagnosis not present

## 2017-04-20 DIAGNOSIS — E785 Hyperlipidemia, unspecified: Secondary | ICD-10-CM | POA: Insufficient documentation

## 2017-04-20 DIAGNOSIS — I25110 Atherosclerotic heart disease of native coronary artery with unstable angina pectoris: Secondary | ICD-10-CM | POA: Insufficient documentation

## 2017-04-20 DIAGNOSIS — I2510 Atherosclerotic heart disease of native coronary artery without angina pectoris: Secondary | ICD-10-CM

## 2017-04-20 DIAGNOSIS — I5032 Chronic diastolic (congestive) heart failure: Secondary | ICD-10-CM | POA: Insufficient documentation

## 2017-04-20 DIAGNOSIS — E119 Type 2 diabetes mellitus without complications: Secondary | ICD-10-CM | POA: Insufficient documentation

## 2017-04-20 DIAGNOSIS — Z85820 Personal history of malignant melanoma of skin: Secondary | ICD-10-CM | POA: Insufficient documentation

## 2017-04-20 DIAGNOSIS — I252 Old myocardial infarction: Secondary | ICD-10-CM | POA: Insufficient documentation

## 2017-04-20 DIAGNOSIS — M1990 Unspecified osteoarthritis, unspecified site: Secondary | ICD-10-CM | POA: Diagnosis not present

## 2017-04-20 DIAGNOSIS — Z882 Allergy status to sulfonamides status: Secondary | ICD-10-CM | POA: Diagnosis not present

## 2017-04-20 DIAGNOSIS — E039 Hypothyroidism, unspecified: Secondary | ICD-10-CM | POA: Diagnosis not present

## 2017-04-20 DIAGNOSIS — I110 Hypertensive heart disease with heart failure: Secondary | ICD-10-CM | POA: Insufficient documentation

## 2017-04-20 HISTORY — PX: LEFT HEART CATH AND CORONARY ANGIOGRAPHY: CATH118249

## 2017-04-20 LAB — GLUCOSE, CAPILLARY: GLUCOSE-CAPILLARY: 117 mg/dL — AB (ref 65–99)

## 2017-04-20 SURGERY — LEFT HEART CATH AND CORONARY ANGIOGRAPHY
Anesthesia: LOCAL

## 2017-04-20 MED ORDER — SODIUM CHLORIDE 0.9% FLUSH: 3.0000 mL | Freq: Two times a day (BID) | INTRAVENOUS | Status: DC

## 2017-04-20 MED ORDER — HEPARIN SODIUM (PORCINE) 1000 UNIT/ML IJ SOLN
INTRAMUSCULAR | Status: DC | PRN
  Administered 2017-04-20: 3500 [IU] via INTRAVENOUS

## 2017-04-20 MED ORDER — SODIUM CHLORIDE 0.9 % IV SOLN
INTRAVENOUS | Status: AC
  Administered 2017-04-20: 15:00:00 via INTRAVENOUS

## 2017-04-20 MED ORDER — SODIUM CHLORIDE 0.9% FLUSH: 3.0000 mL | INTRAVENOUS | Status: DC | PRN

## 2017-04-20 MED ORDER — IOPAMIDOL (ISOVUE-370) INJECTION 76%
INTRAVENOUS | Status: DC | PRN
  Administered 2017-04-20: 70 mL via INTRA_ARTERIAL

## 2017-04-20 MED ORDER — HEPARIN (PORCINE) IN NACL 2-0.9 UNIT/ML-% IJ SOLN
INTRAMUSCULAR | Status: AC
  Filled 2017-04-20: qty 1000

## 2017-04-20 MED ORDER — VERAPAMIL HCL 2.5 MG/ML IV SOLN
INTRAVENOUS | Status: AC
  Filled 2017-04-20: qty 2

## 2017-04-20 MED ORDER — IOPAMIDOL (ISOVUE-370) INJECTION 76%
INTRAVENOUS | Status: AC
  Filled 2017-04-20: qty 100

## 2017-04-20 MED ORDER — HEPARIN (PORCINE) IN NACL 2-0.9 UNIT/ML-% IJ SOLN
INTRAMUSCULAR | Status: DC | PRN
  Administered 2017-04-20: 10 mL via INTRA_ARTERIAL

## 2017-04-20 MED ORDER — DIAZEPAM 5 MG PO TABS
ORAL_TABLET | ORAL | Status: AC
  Filled 2017-04-20: qty 1

## 2017-04-20 MED ORDER — MIDAZOLAM HCL 2 MG/2ML IJ SOLN
INTRAMUSCULAR | Status: DC | PRN
  Administered 2017-04-20 (×3): 1 mg via INTRAVENOUS

## 2017-04-20 MED ORDER — MIDAZOLAM HCL 2 MG/2ML IJ SOLN
INTRAMUSCULAR | Status: AC
  Filled 2017-04-20: qty 2

## 2017-04-20 MED ORDER — LIDOCAINE HCL (PF) 1 % IJ SOLN
INTRAMUSCULAR | Status: DC | PRN
  Administered 2017-04-20: 2 mL via INTRADERMAL

## 2017-04-20 MED ORDER — ASPIRIN 81 MG PO CHEW: 81.0000 mg | CHEWABLE_TABLET | ORAL | Status: DC

## 2017-04-20 MED ORDER — HEPARIN (PORCINE) IN NACL 2-0.9 UNIT/ML-% IJ SOLN
INTRAMUSCULAR | Status: AC | PRN
  Administered 2017-04-20: 1000 mL

## 2017-04-20 MED ORDER — SODIUM CHLORIDE 0.9 % WEIGHT BASED INFUSION
3.0000 mL/kg/h | INTRAVENOUS | Status: AC
  Administered 2017-04-20: 3 mL/kg/h via INTRAVENOUS

## 2017-04-20 MED ORDER — SODIUM CHLORIDE 0.9 % WEIGHT BASED INFUSION: 1.0000 mL/kg/h | INTRAVENOUS | Status: DC

## 2017-04-20 MED ORDER — SODIUM CHLORIDE 0.9 % IV SOLN: 250.0000 mL | INTRAVENOUS | Status: DC | PRN

## 2017-04-20 MED ORDER — DIAZEPAM 5 MG PO TABS
5.0000 mg | ORAL_TABLET | Freq: Once | ORAL | Status: AC
  Administered 2017-04-20: 5 mg via ORAL

## 2017-04-20 MED ORDER — FENTANYL CITRATE (PF) 100 MCG/2ML IJ SOLN
INTRAMUSCULAR | Status: AC
  Filled 2017-04-20: qty 2

## 2017-04-20 MED ORDER — HEPARIN SODIUM (PORCINE) 1000 UNIT/ML IJ SOLN
INTRAMUSCULAR | Status: AC
  Filled 2017-04-20: qty 1

## 2017-04-20 MED ORDER — LIDOCAINE HCL 2 % IJ SOLN
INTRAMUSCULAR | Status: AC
  Filled 2017-04-20: qty 10

## 2017-04-20 MED ORDER — FENTANYL CITRATE (PF) 100 MCG/2ML IJ SOLN
INTRAMUSCULAR | Status: DC | PRN
Start: 2017-04-20 — End: 2017-04-20
  Administered 2017-04-20: 25 ug via INTRAVENOUS
  Administered 2017-04-20: 50 ug via INTRAVENOUS

## 2017-04-20 SURGICAL SUPPLY — 10 items
CATH INFINITI 5FR MULTPACK ANG (CATHETERS) ×1 IMPLANT
DEVICE RAD COMP TR BAND LRG (VASCULAR PRODUCTS) ×1 IMPLANT
GLIDESHEATH SLEND SS 6F .021 (SHEATH) ×1 IMPLANT
GUIDEWIRE INQWIRE 1.5J.035X260 (WIRE) IMPLANT
INQWIRE 1.5J .035X260CM (WIRE) ×2
KIT HEART LEFT (KITS) ×2 IMPLANT
PACK CARDIAC CATHETERIZATION (CUSTOM PROCEDURE TRAY) ×2 IMPLANT
SYR MEDRAD MARK V 150ML (SYRINGE) ×2 IMPLANT
TRANSDUCER W/STOPCOCK (MISCELLANEOUS) ×2 IMPLANT
TUBING CIL FLEX 10 FLL-RA (TUBING) ×2 IMPLANT

## 2017-04-20 NOTE — H&P (View-Only) (Signed)
Patient ID: Rebecca [name], female   DOB: 05/16/1946, 70 y.o.   MRN: [id number]    Patient ID: Rebecca [name], female   DOB: 11/30/1945, 70 y.o.   MRN: [id number]  Patient Name: Rebecca [name] Date of Encounter: 04/18/2017  Primary Care Provider:  Nafziger, Cory, NP Primary Cardiologist:  Jaquae Rieves  Patient Profile  Chief complain: Chest pain  Problem List   Past Medical History:  Diagnosis Date  . Asthma    extrinsic  . CAD (coronary artery disease)   . Chest pain, atypical   . Diabetes (HCC)   . Edema   . Fatigue   . Fatigue   . GERD (gastroesophageal reflux disease)   . Hyperlipidemia   . Hypertension    familiar  . Hypothyroidism   . IBS (irritable bowel syndrome)    hx  . Keratosis, seborrheic   . Labyrinthitis   . MI (myocardial infarction) (HCC)    age 53  . Osteoarthritis   . Skin cancer    Melanoma    Past Surgical History:  Procedure Laterality Date  . Breast Biopsy     dense breast tissue  . BREAST EXCISIONAL BIOPSY Left 2000   benign  . CHOLECYSTECTOMY    . EUS  04/04/2012   Procedure: UPPER ENDOSCOPIC ULTRASOUND (EUS) LINEAR;  Surgeon: Daniel P Jacobs, MD;  Location: [facility];  Service: Endoscopy;  Laterality: N/A;  radial linear  . KNEE ARTHROPLASTY     total right  02-28-2010 Dr Frank Aluisio, left  . TUBAL LIGATION      Allergies  Allergies  Allergen Reactions  . Sulfa Antibiotics   . Sulfonamide Derivatives     REACTION: Stevens Johnson syndrome    Chief complain: Follow up for CAD  HPI  70-year-old female with h/o CAD, MI in 1992, cath x 3, last time in 2010 with PCI to the RCA. She has been doing well, travelling a lot recently. Denies any chest pain. She has gain about 13 lbs since the last year. She is complaining of lower extremities edema for which she take double dose of lasix with some relief. No palpitations, syncope.  11/18/2015, patient's coming for 1 year follow-up, she remains very active attending as many as 8-10  glasses of local [employer] per week and experiences no depression or shortness of breath. She states that her symptoms haven't changed. However on 2 occasions when she was shopping she experienced sudden onset chest tightness radiating to her jaw that immediately resolved with sublingual nitroglycerin. She has hard time managing her diabetes she was intolerant to glipizide and metformin and her diabetes is labile despite regular diet and exercise. She has worsening lower extremity edema and now takes 40 mg of Lasix daily. No orthopnea or paroxysmal dyspnea. No palpitations or syncope.   04/18/2017 - this is one year follow-up, the patient has enjoyed these year tremendously, she traveled to [place] and her husband singing group from [employer] that performed in that he can. She has noticed at that trip that she got short of breath easily and on one occasion she had to stay in and she felt fondly tired and had to take 3 nitroglycerin that helped with her symptoms. She remains very active she exercises 6 times a week but has noticed that despite that she remains tired and has symptoms of chest pain that she attributed to GERD however nitroglycerin resolved her symptoms. She still able to do all activities of daily living and exercise she just doesn't   have as much energy and continues to have on and off exertional chest pain. On occasion she will also get diaphoretic with exertion. Denies any dizziness or syncope. Minimal lower extremity edema no orthopnea or proximal nocturnal dyspnea.  Home Medications  Prior to Admission medications   Medication Sig Start Date End Date Taking? Authorizing Provider  acetaminophen (TYLENOL) 325 MG tablet Take 650 mg by mouth every 6 (six) hours as needed.      Historical Provider, MD  albuterol (PROVENTIL HFA) 108 (90 BASE) MCG/ACT inhaler Inhale 2 puffs into the lungs every 6 (six) hours as needed for wheezing. 05/16/12   Jeffrey A Todd, MD  albuterol (PROVENTIL  HFA;VENTOLIN HFA) 108 (90 BASE) MCG/ACT inhaler Inhale 2 puffs into the lungs every 6 (six) hours as needed for wheezing.    Historical Provider, MD  albuterol (PROVENTIL) (2.5 MG/3ML) 0.083% nebulizer solution Take 3 mLs (2.5 mg total) by nebulization 4 (four) times daily. 05/16/12   Jeffrey A Todd, MD  aspirin 325 MG tablet Take 325 mg by mouth daily.      Historical Provider, MD  atorvastatin (LIPITOR) 80 MG tablet Take 1 tablet (80 mg total) by mouth daily. 05/16/12   Jeffrey A Todd, MD  beclomethasone (QVAR) 80 MCG/ACT inhaler Inhale 2 puffs into the lungs 2 (two) times daily. 05/16/12   Jeffrey A Todd, MD  beclomethasone (QVAR) 80 MCG/ACT inhaler Inhale 1 puff into the lungs 2 (two) times daily as needed. Shortness of breath    Historical Provider, MD  calcium carbonate (OS-CAL) 600 MG TABS Take 600 mg by mouth 2 (two) times daily with a meal.      Historical Provider, MD  calcium carbonate (OS-CAL) 600 MG TABS Take 600 mg by mouth daily.    Historical Provider, MD  cetirizine (ZYRTEC) 10 MG tablet Take 10 mg by mouth daily.      Historical Provider, MD  cetirizine (ZYRTEC) 10 MG tablet Take 10 mg by mouth daily as needed for allergies.    Historical Provider, MD  Cholecalciferol (VITAMIN D) 2000 UNITS CAPS Take 1 capsule by mouth daily.      Historical Provider, MD  conjugated estrogens (PREMARIN) vaginal cream Place 0.25 Applicatorfuls vaginally every 7 (seven) days. weekly 05/16/12   Jeffrey A Todd, MD  conjugated estrogens (PREMARIN) vaginal cream Place 0.5 g vaginally daily.    Historical Provider, MD  diazepam (VALIUM) 5 MG tablet Take 1 tablet (5 mg total) by mouth every 6 (six) hours as needed for anxiety. 10/17/12   Kevin M Campos, MD  diltiazem (CARDIZEM CD) 180 MG 24 hr capsule Take 1 capsule (180 mg total) by mouth daily. 05/16/12   Jeffrey A Todd, MD  diltiazem (DILACOR XR) 180 MG 24 hr capsule Take 180 mg by mouth daily.    Historical Provider, MD  furosemide (LASIX) 20 MG tablet  Take 1 tablet (20 mg total) by mouth 2 (two) times daily as needed. 05/23/12   Jeffrey A Todd, MD  HYDROcodone-acetaminophen (VICODIN ES) 7.5-750 MG per tablet One half to one tablet 3 times daily when necessary for pain 07/03/12   Jeffrey A Todd, MD  hyoscyamine (LEVSIN SL) 0.125 MG SL tablet Place 1 tablet (0.125 mg total) under the tongue every 4 (four) hours as needed. 05/16/12   Jeffrey A Todd, MD  ibuprofen (ADVIL,MOTRIN) 200 MG tablet Take 600 mg by mouth every 6 (six) hours as needed for pain.    Historical Provider, MD  levothyroxine (SYNTHROID, LEVOTHROID) 50 MCG   tablet Take 1 tablet (50 mcg total) by mouth daily. 05/16/12   Jeffrey A Todd, MD  levothyroxine (SYNTHROID, LEVOTHROID) 50 MCG tablet Take 1 tablet (50 mcg total) by mouth daily before breakfast. 03/24/13   Jeffrey A Todd, MD  Loperamide HCl (IMODIUM A-D PO) Take 1 tablet by mouth as needed.    Historical Provider, MD  Meth-Hyo-M Bl-Benz Acd-Ph Sal (HYOPHEN PO) Take 1 tablet by mouth 2 (two) times daily.    Historical Provider, MD  mometasone (NASONEX) 50 MCG/ACT nasal spray Place 2 sprays into the nose as needed. 05/16/12   Jeffrey A Todd, MD  mometasone (NASONEX) 50 MCG/ACT nasal spray Place 2 sprays into the nose daily as needed. breathing    Historical Provider, MD  montelukast (SINGULAIR) 10 MG tablet Take 1 tablet (10 mg total) by mouth daily. 05/16/12   Jeffrey A Todd, MD  montelukast (SINGULAIR) 10 MG tablet Take 10 mg by mouth as needed.    Historical Provider, MD  Naphazoline-Polyethyl Glycol (EYE DROPS) 0.012-0.2 % SOLN Apply to eye. Artifical eye drops     Historical Provider, MD  nitrofurantoin, macrocrystal-monohydrate, (MACROBID) 100 MG capsule Take 100 mg by mouth 2 (two) times daily.    Historical Provider, MD  nitroGLYCERIN (NITROSTAT) 0.4 MG SL tablet Place 1 tablet (0.4 mg total) under the tongue every 5 (five) minutes as needed. 05/16/12   Jeffrey A Todd, MD  Omega-3 Fatty Acids (FISH OIL) 1200 MG CAPS Take by  mouth.      Historical Provider, MD  omeprazole (PRILOSEC) 20 MG capsule Take 1 capsule (20 mg total) by mouth 2 (two) times daily. 05/16/12   Jeffrey A Todd, MD  oxyCODONE-acetaminophen (PERCOCET/ROXICET) 5-325 MG per tablet Take 1 tablet by mouth every 4 (four) hours as needed for pain. 10/17/12   Kevin M Campos, MD  potassium chloride SA (K-DUR,KLOR-CON) 20 MEQ tablet Take 1 tablet (20 mEq total) by mouth daily. 05/16/12   Jeffrey A Todd, MD  salicylic acid 6 % gel Apply 1 application topically as needed. For feet    Historical Provider, MD  Salicylic Acid-Cleanser 6 % (CREAM) KIT Apply topically.      Historical Provider, MD    Family History  Family History  Problem Relation Age of Onset  . Heart disease Mother   . Hypertension Mother   . Heart attack Mother 45       MULTIPLES OVER TIME  . Melanoma Daughter 29       METASTATIC  . Breast cancer Sister   . Hypertension Father   . Hypertension Sister     Social History  Social History   Social History  . Marital status: Married    Spouse name: N/A  . Number of children: 2  . Years of education: N/A   Occupational History  . RN-retired Retired   Social History Main Topics  . Smoking status: Never Smoker  . Smokeless tobacco: Never Used  . Alcohol use No     Comment: rarely  . Drug use: No  . Sexual activity: Not on file   Other Topics Concern  . Not on file   Social History Narrative   ** Merged History Encounter **         Review of Systems General:  No chills, fever, night sweats or weight changes.  Cardiovascular:  No chest pain, dyspnea on exertion,  + lower exetrmities edema, orthopnea, palpitations, paroxysmal nocturnal dyspnea. Dermatological: No rash, lesions/masses Respiratory: No cough, dyspnea Urologic: No hematuria,   dysuria Abdominal:   No nausea, vomiting, diarrhea, bright red blood per rectum, melena, or hematemesis Neurologic:  No visual changes, wkns, changes in mental status. All other  systems reviewed and are otherwise negative except as noted above.  Physical Exam  Blood pressure 124/74, heart rate 64 weight 146 pounds. General: Pleasant, NAD Psych: Normal affect. Neuro: Alert and oriented X 3. Moves all extremities spontaneously. HEENT: Normal  Neck: Supple without bruits or JVD. Lungs:  Resp regular and unlabored, CTA. Heart: RRR no s3, s4, or murmurs. Abdomen: Soft, non-tender, non-distended, BS + x 4.  Extremities: No clubbing, cyanosis or edema. DP/PT/Radials 2+ and equal bilaterally.  Accessory Clinical Findings  ECG - shows sinus rhythm negative T waves in the inferior and anterolateral leads suggestive of ischemia this findings are new when compared to the prior EKG from last year. This was personally reviewed.   Lipid Panel     Component Value Date/Time   CHOL 171 11/07/2016 0955   TRIG 86.0 11/07/2016 0955   HDL 56.50 11/07/2016 0955   CHOLHDL 3 11/07/2016 0955   VLDL 17.2 11/07/2016 0955   LDLCALC 97 11/07/2016 0955   TTE 04/28/2013  Left ventricle: The cavity size was normal. Wall thickness was normal. The estimated ejection fraction was 60%. Wall motion was normal; there were no regional wall motion abnormalities.  Aortic valve: Structurally normal valve. Cusp separation was normal. Doppler: Transvalvular velocity was within the normal range. There was no stenosis. No regurgitation.  Aorta: Aortic root: The aortic root was normal in size.  Mitral valve: Structurally normal valve. Leaflet separation was normal. Doppler: Transvalvular velocity was within the normal range. There was no evidence for stenosis. No regurgitation.  Left atrium: The atrium was normal in size.  Right ventricle: The cavity size was normal. Systolic function was normal.  Pulmonic valve: The valve appears to be grossly normal. Doppler: No significant regurgitation.  Tricuspid valve: Structurally normal valve. Leaflet separation was normal. Doppler:  Transvalvular velocity was within the normal range. No regurgitation.  Right atrium: The atrium was normal in size.  Pericardium: There was no pericardial effusion.    Assessment & Plan  1. CAD, S/P PCI RCA in 2010, now stable, continue ASA 81 mg QD, atorvastatin 80 mg QD, omega 3 acids, no BB (asthma), she has new exertional chest pain and fatigue, her EKG shows new negative T waves in inferior and anterolateral leads, we will schedule the patient for left cardiac catheterization this Friday, October 5 with Dr. McAlhany.  2. Hyperlipidemia - lipids at goal on atorvastatin 80 mg daily. It's tolerated well he will continue.  3. Hypertension - controlled   4. New diagnosis of diabetes in 2016 - HbA1c 7.4%cannot tolerate oral diabetes meds, diet and exercise only, hemoglobin and improved to 6.7%.   5. H/o hypothyroidism on Synthroid, TSH normal  6. Lower extremity edema - chronic diastolic CHF - she is well compensated now.   We will obtain BMP, CBC and INR prior to the Friday.  Keitha Kolk, MD 04/18/2017, 5:16 PM   

## 2017-04-20 NOTE — Interval H&P Note (Signed)
History and Physical Interval Note:  04/20/2017 1:40 PM  [name]  has presented today for cardiac cath with the diagnosis of CAD/unstable angina. The various methods of treatment have been discussed with the patient and family. After consideration of risks, benefits and other options for treatment, the patient has consented to  Procedure(s): LEFT HEART CATH AND CORONARY ANGIOGRAPHY (N/A) as a surgical intervention .  The patient's history has been reviewed, patient examined, no change in status, stable for surgery.  I have reviewed the patient's chart and labs.  Questions were answered to the patient's satisfaction.    Cath Lab Visit (complete for each Cath Lab visit)  Clinical Evaluation Leading to the Procedure:   ACS: No.  Non-ACS:    Anginal Classification: CCS III  Anti-ischemic medical therapy: Minimal Therapy (1 class of medications)  Non-Invasive Test Results: No non-invasive testing performed  Prior CABG: No previous CABG         Lauree Chandler

## 2017-04-20 NOTE — Discharge Instructions (Signed)

## 2017-04-22 ENCOUNTER — Encounter: Payer: Self-pay | Admitting: Adult Health

## 2017-04-22 ENCOUNTER — Encounter: Payer: Self-pay | Admitting: Cardiology

## 2017-04-23 ENCOUNTER — Other Ambulatory Visit: Payer: Self-pay | Admitting: Adult Health

## 2017-04-23 ENCOUNTER — Encounter (HOSPITAL_COMMUNITY): Payer: Self-pay | Admitting: Cardiovascular Disease

## 2017-04-23 MED ORDER — ALBUTEROL SULFATE HFA 108 (90 BASE) MCG/ACT IN AERS: 2.0000 | INHALATION_SPRAY | Freq: Four times a day (QID) | RESPIRATORY_TRACT | 3 refills | Status: DC | PRN

## 2017-04-23 MED FILL — Lidocaine HCl Local Inj 2%: INTRAMUSCULAR | Qty: 10 | Status: AC

## 2017-06-16 ENCOUNTER — Other Ambulatory Visit: Payer: Self-pay | Admitting: Adult Health

## 2017-06-16 DIAGNOSIS — I2510 Atherosclerotic heart disease of native coronary artery without angina pectoris: Secondary | ICD-10-CM

## 2017-06-19 NOTE — Telephone Encounter (Signed)
Sent to the pharmacy by e-scribe. 

## 2017-06-26 ENCOUNTER — Other Ambulatory Visit: Payer: Self-pay | Admitting: Adult Health

## 2017-06-26 DIAGNOSIS — E119 Type 2 diabetes mellitus without complications: Secondary | ICD-10-CM

## 2017-06-27 ENCOUNTER — Encounter: Payer: Self-pay | Admitting: Adult Health

## 2017-06-27 ENCOUNTER — Ambulatory Visit (INDEPENDENT_AMBULATORY_CARE_PROVIDER_SITE_OTHER): Payer: Medicare Other | Admitting: Adult Health

## 2017-06-27 VITALS — BP 128/60 | Temp 97.5°F | Wt 143.0 lb

## 2017-06-27 DIAGNOSIS — E119 Type 2 diabetes mellitus without complications: Secondary | ICD-10-CM | POA: Diagnosis not present

## 2017-06-27 DIAGNOSIS — R079 Chest pain, unspecified: Secondary | ICD-10-CM

## 2017-06-27 DIAGNOSIS — I2583 Coronary atherosclerosis due to lipid rich plaque: Secondary | ICD-10-CM | POA: Diagnosis not present

## 2017-06-27 DIAGNOSIS — I2510 Atherosclerotic heart disease of native coronary artery without angina pectoris: Secondary | ICD-10-CM

## 2017-06-27 LAB — POCT GLYCOSYLATED HEMOGLOBIN (HGB A1C): Hemoglobin A1C: 6.4

## 2017-06-27 NOTE — Telephone Encounter (Signed)
Sent to the pharmacy by e-scribe. 

## 2017-06-27 NOTE — Progress Notes (Signed)
Subjective:    Patient ID: Rebecca [name], female    DOB: [birth year], 71 y.o.   MRN: [id number]  HPI  71 year old female who  has a past medical history of Asthma, CAD (coronary artery disease), Chest pain, atypical, Diabetes ([place]), Edema, Fatigue, Fatigue, GERD (gastroesophageal reflux disease), Hyperlipidemia, Hypertension, Hypothyroidism, IBS (irritable bowel syndrome), Keratosis, seborrheic, Labyrinthitis, MI (myocardial infarction) ([place]), Osteoarthritis, and Skin cancer.  She presents to the office today for follow up regarding diabetes. She is currently maintained on Invokana 100 mg. Since the last time I saw her she tried a keto diet and went off Invokana for three weeks. She developed diarrhea from the diet and had to go back on invokana. She has been checking her blood sugars at home and has had readings in 80-130.   Unfortunately she has been put in the donut hole with Invokana. She plans to call her insurance company to see what they cover    She also reports that about 14 days ago she fell onto a metal chair causing injury to her right lower rib cage. She has been managing pain with Motrin and Tylenol. She feels as though she may have broken a rib but her pain is gradually becoming better.   Review of Systems See HPI   Past Medical History:  Diagnosis Date  . Asthma    extrinsic  . CAD (coronary artery disease)   . Chest pain, atypical   . Diabetes ([place])   . Edema   . Fatigue   . Fatigue   . GERD (gastroesophageal reflux disease)   . Hyperlipidemia   . Hypertension    familiar  . Hypothyroidism   . IBS (irritable bowel syndrome)    hx  . Keratosis, seborrheic   . Labyrinthitis   . MI (myocardial infarction) ([place])    age 47  . Osteoarthritis   . Skin cancer    Melanoma     Social History   Socioeconomic History  . Marital status: Married    Spouse name: Not on file  . Number of children: 2  . Years of education: Not on file  . Highest education level:  Not on file  Social Needs  . Financial resource strain: Not on file  . Food insecurity - worry: Not on file  . Food insecurity - inability: Not on file  . Transportation needs - medical: Not on file  . Transportation needs - non-medical: Not on file  Occupational History  . Occupation: RN-retired    Employer: RETIRED  Tobacco Use  . Smoking status: Never Smoker  . Smokeless tobacco: Never Used  Substance and Sexual Activity  . Alcohol use: No    Comment: rarely  . Drug use: No  . Sexual activity: Not on file  Other Topics Concern  . Not on file  Social History Narrative   ** Merged History Encounter **        Past Surgical History:  Procedure Laterality Date  . Breast Biopsy     dense breast tissue  . BREAST EXCISIONAL BIOPSY Left 2000   benign  . CHOLECYSTECTOMY    . EUS  04/04/2012   Procedure: UPPER ENDOSCOPIC ULTRASOUND (EUS) LINEAR;  Surgeon: Milus Banister, MD;  Location: [facility];  Service: Endoscopy;  Laterality: N/A;  radial linear  . KNEE ARTHROPLASTY     total right  02-28-2010 Dr Pilar Plate Aluisio, left  . LEFT HEART CATH AND CORONARY ANGIOGRAPHY N/A 04/20/2017   Procedure: LEFT  HEART CATH AND CORONARY ANGIOGRAPHY;  Surgeon: Burnell Blanks, MD;  Location: [facility];  Service: Cardiovascular;  Laterality: N/A;  . TUBAL LIGATION      Family History  Problem Relation Age of Onset  . Heart disease Mother   . Hypertension Mother   . Heart attack Mother 66       MULTIPLES OVER TIME  . Melanoma Daughter 32       METASTATIC  . Breast cancer Sister   . Hypertension Father   . Hypertension Sister     Allergies  Allergen Reactions  . Sulfa Antibiotics   . Sulfonamide Derivatives     REACTION: Kathreen Cosier syndrome    Current Outpatient Medications on File Prior to Visit  Medication Sig Dispense Refill  . ACCU-CHEK FASTCLIX LANCETS MISC CHECK GLUCOSE 3 TIMES DAILY 100 each 3  . acetaminophen (TYLENOL) 325 MG tablet Take 650 mg by  mouth every 6 (six) hours as needed.      Marland Kitchen aspirin EC 81 MG tablet Take 1 tablet (81 mg total) by mouth daily. 90 tablet 3  . atorvastatin (LIPITOR) 80 MG tablet Take 1 tablet (80 mg total) by mouth daily. 90 tablet 3  . calcium carbonate (OS-CAL) 600 MG TABS Take 1,200 mg by mouth daily.     . canagliflozin (INVOKANA) 100 MG TABS tablet Take 1 tablet (100 mg total) by mouth daily before breakfast. 90 tablet 1  . Cholecalciferol (VITAMIN D) 2000 UNITS CAPS Take 1 capsule by mouth daily.      Marland Kitchen conjugated estrogens (PREMARIN) vaginal cream Place 0.5 Applicatorfuls vaginally every 7 (seven) days. 42.5 g 6  . diltiazem (CARDIZEM CD) 180 MG 24 hr capsule TAKE 1 CAPSULE BY MOUTH  DAILY 90 capsule 1  . diphenoxylate-atropine (LOMOTIL) 2.5-0.025 MG per tablet Take 1 tablet by mouth 4 (four) times daily as needed for diarrhea or loose stools. 30 tablet 0  . fluticasone (FLONASE) 50 MCG/ACT nasal spray Place 1 spray into both nostrils 2 (two) times daily as needed for allergies or rhinitis (Seasonal allergies).    . furosemide (LASIX) 20 MG tablet Take 1 tablet (20 mg total) by mouth 2 (two) times daily. 180 tablet 3  . glucose blood (ACCU-CHEK SMARTVIEW) test strip CHECK GLUCOSE THREE TIMES DAILY 100 each 3  . ibuprofen (ADVIL,MOTRIN) 200 MG tablet Take 600 mg by mouth every 6 (six) hours as needed for pain.    Marland Kitchen levothyroxine (SYNTHROID, LEVOTHROID) 50 MCG tablet TAKE 1 TABLET BY MOUTH  DAILY BEFORE BREAKFAST 90 tablet 2  . Naphazoline-Polyethyl Glycol (EYE DROPS) 0.012-0.2 % SOLN Apply to eye as needed. Artifical eye drops    . Omega-3 Fatty Acids (FISH OIL) 1200 MG CAPS Take by mouth.      . potassium chloride SA (K-DUR,KLOR-CON) 20 MEQ tablet Take 1 tablet (20 mEq total) by mouth 2 (two) times daily. 180 tablet 3  . traZODone (DESYREL) 50 MG tablet Take 0.5-1 tablets (25-50 mg total) by mouth at bedtime as needed for sleep. 90 tablet 2  . tiZANidine (ZANAFLEX) 4 MG tablet Take 1 tablet (4 mg total) by  mouth every 6 (six) hours as needed for muscle spasms. (Patient not taking: Reported on 06/27/2017) 30 tablet 3   No current facility-administered medications on file prior to visit.     BP 128/60   Temp (!) 97.5 F (36.4 C) (Oral)   Wt 143 lb (64.9 kg)   BMI 28.88 kg/m  Objective:   Physical Exam  Constitutional: She is oriented to person, place, and time. She appears well-developed and well-nourished. No distress.  Cardiovascular: Normal rate, regular rhythm, normal heart sounds and intact distal pulses. Exam reveals no gallop and no friction rub.  No murmur heard. Pulmonary/Chest: Effort normal and breath sounds normal. No respiratory distress. She has no wheezes. She has no rales. She exhibits tenderness.  Neurological: She is alert and oriented to person, place, and time.  Skin: Skin is warm and dry. No rash noted. She is not diaphoretic. No erythema. No pallor.  Psychiatric: She has a normal mood and affect. Her behavior is normal. Judgment and thought content normal.  Nursing note and vitals reviewed.     Assessment & Plan:  1. Diabetes mellitus without complication ([place]) - POCT A1C- has improved from 7.0 to 6.4  - She will let me know what her insurance company covers.   2. Right-sided chest pain - She does not want a chest xray or additional pain medications   [name], NP

## 2017-07-16 ENCOUNTER — Other Ambulatory Visit: Payer: Self-pay | Admitting: Adult Health

## 2017-07-18 NOTE — Telephone Encounter (Signed)
Sent to the pharmacy by e-scribe. 

## 2017-07-24 ENCOUNTER — Other Ambulatory Visit: Payer: Self-pay | Admitting: Adult Health

## 2017-07-24 DIAGNOSIS — I2510 Atherosclerotic heart disease of native coronary artery without angina pectoris: Secondary | ICD-10-CM

## 2017-07-24 NOTE — Telephone Encounter (Signed)
Tommi Rumps out of the office this week.  Will check with cardiology for refill.

## 2017-08-01 DIAGNOSIS — M25561 Pain in right knee: Secondary | ICD-10-CM | POA: Diagnosis not present

## 2017-08-27 DIAGNOSIS — M25511 Pain in right shoulder: Secondary | ICD-10-CM | POA: Diagnosis not present

## 2017-09-04 ENCOUNTER — Other Ambulatory Visit: Payer: Self-pay

## 2017-09-04 ENCOUNTER — Encounter: Payer: Self-pay | Admitting: Physical Therapy

## 2017-09-04 ENCOUNTER — Ambulatory Visit: Payer: Medicare Other | Attending: Orthopedic Surgery | Admitting: Physical Therapy

## 2017-09-04 DIAGNOSIS — M6281 Muscle weakness (generalized): Secondary | ICD-10-CM | POA: Diagnosis not present

## 2017-09-04 DIAGNOSIS — M25511 Pain in right shoulder: Secondary | ICD-10-CM | POA: Insufficient documentation

## 2017-09-04 DIAGNOSIS — M6289 Other specified disorders of muscle: Secondary | ICD-10-CM | POA: Diagnosis not present

## 2017-09-04 NOTE — Therapy (Signed)
During this treatment session, the therapist was present, participating in and directing the treatment. [place], [place], [address] Phone: [phone]   Fax:  [phone]  Physical Therapy Evaluation  Patient Details  Name: Rebecca [name] MRN: [id number] Date of Birth: April 11, 1946 Referring Provider: Jarvis Morgan   Encounter Date: 09/04/2017  PT End of Session - 09/04/17 1042    Visit Number  1    Date for PT Re-Evaluation  11/02/17    PT Start Time  0945    PT Stop Time  1040    PT Time Calculation (min)  55 min    Activity Tolerance  Patient tolerated treatment well    Behavior During Therapy  [facility] for tasks assessed/performed       Past Medical History:  Diagnosis Date  . Asthma    extrinsic  . CAD (coronary artery disease)   . Chest pain, atypical   . Diabetes ([place])   . Edema   . Fatigue   . Fatigue   . GERD (gastroesophageal reflux disease)   . Hyperlipidemia   . Hypertension    familiar  . Hypothyroidism   . IBS (irritable bowel syndrome)    hx  . Keratosis, seborrheic   . Labyrinthitis   . MI (myocardial infarction) ([place])    age 10  . Osteoarthritis   . Skin cancer    Melanoma     Past Surgical History:  Procedure Laterality Date  . Breast Biopsy     dense breast tissue  . BREAST EXCISIONAL BIOPSY Left 2000   benign  . CHOLECYSTECTOMY    . EUS  04/04/2012   Procedure: UPPER ENDOSCOPIC ULTRASOUND (EUS) LINEAR;  Surgeon: Milus Banister, MD;  [facility];  Service: Endoscopy;  Laterality: N/A;  radial linear  . KNEE ARTHROPLASTY     total right  02-28-2010 Dr Pilar Plate Aluisio, left  . LEFT HEART CATH AND CORONARY ANGIOGRAPHY N/A 04/20/2017   Procedure: LEFT HEART CATH AND CORONARY ANGIOGRAPHY;  Surgeon: Burnell Blanks, MD;  Location: [place] [facility];  Service: Cardiovascular;  Laterality: N/A;  . TUBAL LIGATION      There were no vitals filed for  this visit.   Subjective Assessment - 09/04/17 0951    Subjective  Pt. reports having had a shoulder injury about 3 years ago after lifting a heavy suitcase which resolved over time. Pt. current R shoulder pain is reported to feel similar to what the injury felt like which started during the summer but has progressively gotten worse. When pain increase pt. reports pain can radiate into R cerivcal and down to spine of scapula but has not had this pain since cortisone injection. Pain has radiated down arm a couple of times and pt. has reported N&T every now and then down the back side of the hand but dissipates fast. Pt. went to doctor's two weeks ago for pain and was given a cortisone shot. Dr. chose to wait on ordering imaging to see if PT and cortison injection will help but believes pt. might have a partial tear. Pt. reports thinking cortisone shot has helped, prior to injection pt. was not able to lift arm due to pain but is able to now with some tolerable pain. Pt. reports pain waking her up at night due to transitional movement.     Limitations  Lifting;House hold activities    Patient Stated Goals  being able to go  back to exercise classes without pain or limitation ("exercise class feeds my soul")    Currently in Pain?  Yes    Pain Score  2     Pain Location  Shoulder    Pain Orientation  Right    Pain Descriptors / Indicators  Aching;Sharp    Pain Type  Acute pain    Pain Radiating Towards  if pain has increased a lot pain radiates up into cervical neck and down to spine of scapula, sometimes down arm     Pain Onset  More than a month ago    Pain Frequency  Constant    Aggravating Factors   lifting things up, reaching for objects, pushing doors open increases to 8/10     Pain Relieving Factors  rest, cortisone injection, ice, voltaren (not sure if it's helping or not) decreases to 2-3/10    Effect of Pain on Daily Activities  ADLs doing hair, donning/doffing clothes         [facility] PT  Assessment - 09/04/17 0001      Assessment   Medical Diagnosis  R shoulder pain    Referring Provider  [place]    Hand Dominance  Right    Next MD Visit  next week    Prior Therapy  none      Precautions   Precaution Comments  cardiac stents put in      Balance Screen   Has the patient fallen in the past 6 months  No    Has the patient had a decrease in activity level because of a fear of falling?   No    Is the patient reluctant to leave their home because of a fear of falling?   No      Home Environment   Additional Comments  caring for husband (mostly independent but has to help sometimes with ADLs and getting around)      Prior Function   Level of Independence  Independent    Vocation  Retired    Leisure  exercise classes (yoga, aerobics, stability, dancing) 5-6x/week sometimes doing multiple classes a day      ROM / Strength   AROM / PROM / Strength  AROM;PROM;Strength      AROM   AROM Assessment Site  Shoulder    Right/Left Shoulder  Right    Right Shoulder Flexion  138 Degrees pain started ~85-115 deg but disspated with more flex    Right Shoulder ABduction  150 Degrees pain started ~100-130 deg but dissipated >130deg    Right Shoulder Internal Rotation  65 Degrees    Right Shoulder External Rotation  85 Degrees      PROM   PROM Assessment Site  Shoulder    Right/Left Shoulder  Right    Right Shoulder Flexion  175 Degrees with increase in pain    Right Shoulder ABduction  170 Degrees      Strength   Strength Assessment Site  Shoulder    Right/Left Shoulder  Right    Right Shoulder Flexion  4-/5    Right Shoulder ABduction  4/5 no increase in pain with resistance just movement    Right Shoulder Internal Rotation  4-/5    Right Shoulder External Rotation  4-/5      Palpation   Palpation comment  very TTP supraspinatus tendon, ttp biceps muscle belly/long head of biceps tendon      Special Tests    Special Tests  Rotator Cuff Impingement  Rotator Cuff  Impingment tests  Michel Bickers test;Neer impingement test;Full Can test;Empty Can test      Neer Impingement test    Findings  Positive      Hawkins-Kennedy test   Findings  Negative      Empty Can test   Findings  Positive      Full Can test   Findings  Negative    Comment  just weakness             Objective measurements completed on examination: See above findings.              PT Education - 09/04/17 1041    Education provided  Yes    Education Details  HEP exercises/stretch, RICE    Person(s) Educated  Patient    Methods  Explanation;Demonstration;Tactile cues;Verbal cues;Handout    Comprehension  Verbalized understanding;Returned demonstration       PT Short Term Goals - 09/04/17 1049      PT SHORT TERM GOAL #1   Title  independent with initial HEP    Time  2    Period  Weeks    Status  New        PT Long Term Goals - 09/04/17 1049      PT LONG TERM GOAL #1   Title  decrease pain 50% for ADLs    Time  8    Period  Weeks    Status  New      PT LONG TERM GOAL #2   Title  increase overall shoulder strength 5/5 for functional use     Time  8    Period  Weeks    Status  New      PT LONG TERM GOAL #3   Title  reports attending normal exercise classes regularly without pain or limitation for QOL    Time  8    Period  Weeks    Status  New      PT LONG TERM GOAL #4   Title  reports being able to do hair without pain or limitation    Time  8    Period  Weeks    Status  New             Plan - 09/04/17 1042    Clinical Impression Statement  Pt. has acute R shoulder pain. Pt. has great ROM, limited by pain, but is able to push through it. The pain that occured with ROM occured within 90-130 deg painful arc range. Overall pt. R shoulder strength weaker than L shoulder with most resisted movements ranging from 4-/5-4/5. Pt. tested positive for Neer's and empty can test. Pt. was very TTP supraspinatus tendon, LH biceps tendon and  biceps muscle belly. Pt. likes to be very active and has great motivation to get better and stronger. Pt. really wants to get back to participating regularly in all of her exercise classes because they 'feed her soul.'    Clinical Presentation  Evolving    Clinical Decision Making  Low    Rehab Potential  Good    PT Frequency  2x / week    PT Duration  8 weeks    PT Treatment/Interventions  Cryotherapy;Electrical Stimulation;Iontophoresis 4mg /ml Dexamethasone;Moist Heat;Therapeutic activities;Therapeutic exercise;Patient/family education;Manual techniques;Vasopneumatic Device    PT Next Visit Plan  start scapular stabilization ex    PT Home Exercise Plan  corner stretch, tband rows and shoulder ext    Consulted and Agree with Plan of Care  Patient       Patient will benefit from skilled therapeutic intervention in order to improve the following deficits and impairments:  Pain, Decreased range of motion, Decreased strength, Impaired UE functional use, Increased edema, Impaired tone  Visit Diagnosis: Acute pain of right shoulder  Muscle weakness (generalized)  Muscle tone increased     Problem List Patient Active Problem List   Diagnosis Date Noted  . Coronary artery disease   . Dysuria 02/17/2017  . Diarrhea 02/17/2017  . Vitamin D deficiency 11/07/2016  . Acute on chronic diastolic CHF (congestive heart failure), NYHA class 1 ([place]) 11/18/2015  . Diabetes mellitus without complication ([place]) 16/04/9603  . Reactive airway disease with acute exacerbation 09/10/2014  . Nonspecific (abnormal) findings on radiological and other examination of gastrointestinal tract 04/04/2012  . Insomnia, idiopathic 10/04/2010  . Hyperlipidemia 09/22/2009  . Essential hypertension 09/22/2009  . CAD, NATIVE VESSEL 09/22/2009  . EDEMA 05/19/2009  . Hypothyroidism 02/23/2009  . EXTRINSIC ASTHMA, UNSPECIFIED 02/10/2009  . KERATOSIS, SEBORRHEIC [place] 04/16/2007  . SKIN CANCER, HX OF 02/14/2007  .  Depression 01/24/2007  . MYOCARDIAL INFARCTION, HX OF 01/24/2007  . GERD 01/24/2007  . Osteoarthritis 01/24/2007  . COLONIC POLYPS, HX OF 01/24/2007  . IRRITABLE BOWEL SYNDROME, HX OF 01/24/2007    Juliann Pulse SPT 09/04/2017, 10:53 AM  [address] [place], [place], [address] Phone: [phone]   Fax:  [phone]  Name: Rebecca [name] MRN: [id number] Date of Birth: Jul 29, 1945

## 2017-09-10 ENCOUNTER — Encounter: Payer: Self-pay | Admitting: Physical Therapy

## 2017-09-10 ENCOUNTER — Ambulatory Visit: Payer: Medicare Other | Admitting: Physical Therapy

## 2017-09-10 DIAGNOSIS — M25511 Pain in right shoulder: Secondary | ICD-10-CM

## 2017-09-10 DIAGNOSIS — M6289 Other specified disorders of muscle: Secondary | ICD-10-CM | POA: Diagnosis not present

## 2017-09-10 DIAGNOSIS — M6281 Muscle weakness (generalized): Secondary | ICD-10-CM

## 2017-09-10 NOTE — Therapy (Signed)
During this treatment session, the therapist was present, participating in and directing the treatment. [place], [place], [address] Phone: [phone]   Fax:  [phone]  Physical Therapy Treatment  Patient Details  Name: Rebecca [name] MRN: [id number] Date of Birth: 05-02-1946 Referring Provider: Jarvis Morgan   Encounter Date: 09/10/2017  PT End of Session - 09/10/17 1011    Visit Number  2    Date for PT Re-Evaluation  11/02/17    PT Start Time  0930    PT Stop Time  1017    PT Time Calculation (min)  47 min    Activity Tolerance  Patient tolerated treatment well    Behavior During Therapy  [facility] for tasks assessed/performed       Past Medical History:  Diagnosis Date  . Asthma    extrinsic  . CAD (coronary artery disease)   . Chest pain, atypical   . Diabetes ([place])   . Edema   . Fatigue   . Fatigue   . GERD (gastroesophageal reflux disease)   . Hyperlipidemia   . Hypertension    familiar  . Hypothyroidism   . IBS (irritable bowel syndrome)    hx  . Keratosis, seborrheic   . Labyrinthitis   . MI (myocardial infarction) ([place])    age 72  . Osteoarthritis   . Skin cancer    Melanoma     Past Surgical History:  Procedure Laterality Date  . Breast Biopsy     dense breast tissue  . BREAST EXCISIONAL BIOPSY Left 2000   benign  . CHOLECYSTECTOMY    . EUS  04/04/2012   Procedure: UPPER ENDOSCOPIC ULTRASOUND (EUS) LINEAR;  Surgeon: Milus Banister, MD;  [facility];  Service: Endoscopy;  Laterality: N/A;  radial linear  . KNEE ARTHROPLASTY     total right  02-28-2010 Dr Pilar Plate Aluisio, left  . LEFT HEART CATH AND CORONARY ANGIOGRAPHY N/A 04/20/2017   Procedure: LEFT HEART CATH AND CORONARY ANGIOGRAPHY;  Surgeon: Burnell Blanks, MD;  Location: [place] [facility];  Service: Cardiovascular;  Laterality: N/A;  . TUBAL LIGATION      There were no vitals filed for  this visit.  Subjective Assessment - 09/10/17 0928    Subjective  Pt. reports feeling a lot better than when she came in for the evaluation. Pt. reports being able to do hair without thinking about pain in her shoulder and was able to put dishes away in a cabinet high up for the first time this AM. Pt. reports noticing not having pain with certain household activities that once caused her pain and is becoming more aware of things that she's beginning to be able to do again.    Currently in Pain?  Yes    Pain Score  1                       [facility] Adult PT Treatment/Exercise - 09/10/17 0001      Exercises   Exercises  Shoulder      Shoulder Exercises: Seated   Row  Both;20 reps;Weights    Row Weight (lbs)  15    External Rotation  Right;10 reps on orange ex ball, started to increase pain    External Rotation Weight (lbs)  2    Internal Rotation  Right;Theraband    Theraband Level (Shoulder Internal Rotation)  Level 2 (Red) 3x10, sometimes needing  posture cues      Shoulder Exercises: Sidelying   External Rotation  Right;20 reps;Weights verbal and tactile cues to prevent scap from winging    External Rotation Weight (lbs)  2      Shoulder Exercises: Standing   External Rotation  Both;20 reps;Theraband    Theraband Level (Shoulder External Rotation)  Level 2 (Red)    Extension  Both;20 reps;Theraband    Theraband Level (Shoulder Extension)  Level 2 (Red)    Other Standing Exercises  shrugs 2# 2x10, shrugs with backward motion 2# 2x10 cues for good posture      Shoulder Exercises: ROM/Strengthening   UBE (Upper Arm Bike)  L2x36mins    Other ROM/Strengthening Exercises  lats 15# 2x10, rows 15# 2x10      Shoulder Exercises: Stretch   Other Shoulder Stretches  doorway stretch 3 sets 20 seconds      Modalities   Modalities  Cryotherapy      Cryotherapy   Number Minutes Cryotherapy  10 Minutes    Cryotherapy Location  Shoulder    Type of Cryotherapy  Ice pack              PT Education - 09/10/17 1010    Education provided  Yes    Education Details  posture     Person(s) Educated  Patient    Methods  Explanation;Demonstration;Tactile cues;Verbal cues    Comprehension  Verbalized understanding;Returned demonstration       PT Short Term Goals - 09/10/17 1015      PT SHORT TERM GOAL #1   Title  independent with initial HEP    Time  2    Period  Weeks    Status  On-going        PT Long Term Goals - 09/10/17 1015      PT LONG TERM GOAL #4   Title  reports being able to do hair without pain or limitation    Time  8    Period  Weeks    Status  On-going            Plan - 09/10/17 1012    Clinical Impression Statement  Pt. came in with less pain than previously and reports of being able to do more household things (i.e. putting dishes away in a higher cabinet) and ADLs like doing her hair without noticing any pain recently. Pt. tolerated tx well. Pt. needs verbal and tactile cues throughout for posture to prevent scapular winging but becomes cognizant of posture throughout tx and makes changes herself. Pt. experiences some pain with seated ER on ex ball with light weight so changes position to sidelying. Pt. demonstrated scap winging but with tactile and verbal cues was able to do exercise without pain as experienced in seated position.     Rehab Potential  Good    PT Frequency  2x / week    PT Duration  8 weeks    PT Treatment/Interventions  Cryotherapy;Electrical Stimulation;Iontophoresis 4mg /ml Dexamethasone;Moist Heat;Therapeutic activities;Therapeutic exercise;Patient/family education;Manual techniques;Vasopneumatic Device    Consulted and Agree with Plan of Care  Patient       Patient will benefit from skilled therapeutic intervention in order to improve the following deficits and impairments:  Pain, Decreased range of motion, Decreased strength, Impaired UE functional use, Increased edema, Impaired tone  Visit  Diagnosis: Acute pain of right shoulder  Muscle weakness (generalized)  Muscle tone increased     Problem List Patient Active Problem List   Diagnosis Date  Noted  . Coronary artery disease   . Dysuria 02/17/2017  . Diarrhea 02/17/2017  . Vitamin D deficiency 11/07/2016  . Acute on chronic diastolic CHF (congestive heart failure), NYHA class 1 ([place]) 11/18/2015  . Diabetes mellitus without complication ([place]) 77/41/2878  . Reactive airway disease with acute exacerbation 09/10/2014  . Nonspecific (abnormal) findings on radiological and other examination of gastrointestinal tract 04/04/2012  . Insomnia, idiopathic 10/04/2010  . Hyperlipidemia 09/22/2009  . Essential hypertension 09/22/2009  . CAD, NATIVE VESSEL 09/22/2009  . EDEMA 05/19/2009  . Hypothyroidism 02/23/2009  . EXTRINSIC ASTHMA, UNSPECIFIED 02/10/2009  . KERATOSIS, SEBORRHEIC [place] 04/16/2007  . SKIN CANCER, HX OF 02/14/2007  . Depression 01/24/2007  . MYOCARDIAL INFARCTION, HX OF 01/24/2007  . GERD 01/24/2007  . Osteoarthritis 01/24/2007  . COLONIC POLYPS, HX OF 01/24/2007  . IRRITABLE BOWEL SYNDROME, HX OF 01/24/2007    Juliann Pulse SPT 09/10/2017, 10:19 AM  [address] [place], [place], [address] Phone: [phone]   Fax:  [phone]  Name: ANAI LIPSON MRN: [id number] Date of Birth: August 28, 1945

## 2017-09-12 DIAGNOSIS — M25561 Pain in right knee: Secondary | ICD-10-CM | POA: Diagnosis not present

## 2017-09-12 DIAGNOSIS — Z96653 Presence of artificial knee joint, bilateral: Secondary | ICD-10-CM | POA: Diagnosis not present

## 2017-09-12 DIAGNOSIS — M25862 Other specified joint disorders, left knee: Secondary | ICD-10-CM | POA: Diagnosis not present

## 2017-09-12 DIAGNOSIS — M25562 Pain in left knee: Secondary | ICD-10-CM | POA: Diagnosis not present

## 2017-09-12 HISTORY — PX: KNEE ARTHROPLASTY: SHX992

## 2017-09-13 ENCOUNTER — Ambulatory Visit: Payer: Medicare Other | Admitting: Physical Therapy

## 2017-09-13 DIAGNOSIS — M6281 Muscle weakness (generalized): Secondary | ICD-10-CM

## 2017-09-13 DIAGNOSIS — M25511 Pain in right shoulder: Secondary | ICD-10-CM

## 2017-09-13 DIAGNOSIS — M6289 Other specified disorders of muscle: Secondary | ICD-10-CM | POA: Diagnosis not present

## 2017-09-13 NOTE — Therapy (Signed)
[place], [place], [address] Phone: [phone]   Fax:  [phone]  Physical Therapy Treatment  Patient Details  Name: Rebecca [name] MRN: [id number] Date of Birth: [birth year] Referring Provider: Jarvis Morgan   Encounter Date: 09/13/2017  PT End of Session - 09/13/17 1001    Visit Number  3    Date for PT Re-Evaluation  11/02/17    PT Start Time  0925    PT Stop Time  1012    PT Time Calculation (min)  47 min       Past Medical History:  Diagnosis Date  . Asthma    extrinsic  . CAD (coronary artery disease)   . Chest pain, atypical   . Diabetes ([place])   . Edema   . Fatigue   . Fatigue   . GERD (gastroesophageal reflux disease)   . Hyperlipidemia   . Hypertension    familiar  . Hypothyroidism   . IBS (irritable bowel syndrome)    hx  . Keratosis, seborrheic   . Labyrinthitis   . MI (myocardial infarction) ([place])    age 72  . Osteoarthritis   . Skin cancer    Melanoma     Past Surgical History:  Procedure Laterality Date  . Breast Biopsy     dense breast tissue  . BREAST EXCISIONAL BIOPSY Left 2000   benign  . CHOLECYSTECTOMY    . EUS  04/04/2012   Procedure: UPPER ENDOSCOPIC ULTRASOUND (EUS) LINEAR;  Surgeon: Milus Banister, MD;  [facility];  Service: Endoscopy;  Laterality: N/A;  radial linear  . KNEE ARTHROPLASTY     total right  02-28-2010 Dr Pilar Plate Aluisio, left  . LEFT HEART CATH AND CORONARY ANGIOGRAPHY N/A 04/20/2017   Procedure: LEFT HEART CATH AND CORONARY ANGIOGRAPHY;  Surgeon: Burnell Blanks, MD;  Location: [place] [facility];  Service: Cardiovascular;  Laterality: N/A;  . TUBAL LIGATION      There were no vitals filed for this visit.  Subjective Assessment - 09/13/17 0930    Subjective  very sore after last session for about 1.5 days    Currently in Pain?  Yes    Pain Score  4     Pain Location  Shoulder    Pain Orientation  Right                       [facility] Adult PT Treatment/Exercise - 09/13/17 0001      Shoulder Exercises: Standing   External Rotation  Strengthening;Both;15 reps;Theraband    Theraband Level (Shoulder External Rotation)  Level 2 (Red)    Extension  Strengthening;Both;15 reps;Theraband    Theraband Level (Shoulder Extension)  Level 2 (Red)    Row  Strengthening;Both;15 reps;Theraband    Theraband Level (Shoulder Row)  Level 2 (Red)    Other Standing Exercises  2# PNF and empty can 2 sets 10    Other Standing Exercises  yellow tband rhy stab on wall      Shoulder Exercises: Therapy Ball   Other Therapy Ball Exercises  ball vs wall 5 times CC and CCW      Shoulder Exercises: ROM/Strengthening   UBE (Upper Arm Bike)  L 3 2 fwd/2 back    Other ROM/Strengthening Exercises  rows 20# 2 sets 10      Modalities   Modalities  Iontophoresis      Iontophoresis   Type of Iontophoresis  Dexamethasone    Location  RT ant shld    Dose  1.2cc    Time  31m leave on patch      Manual Therapy   Manual Therapy  Joint mobilization;Soft tissue mobilization    Joint Mobilization  RT shld    Soft tissue mobilization  RT shld               PT Short Term Goals - 09/13/17 1001      PT SHORT TERM GOAL #1   Title  independent with initial HEP    Status  Achieved        PT Long Term Goals - 09/10/17 1015      PT LONG TERM GOAL #4   Title  reports being able to do hair without pain or limitation    Time  8    Period  Weeks    Status  On-going            Plan - 09/13/17 1001    Clinical Impression Statement  pt with ant shld tenderness and pain esp with empty can and PNF combination mvmt. cuing with ex to not elevate shld.pt responded well to jt capsule stretching. started ionto as pain is very local. STG met    PT Treatment/Interventions  Cryotherapy;Electrical Stimulation;Iontophoresis '4mg'$ /ml Dexamethasone;Moist Heat;Therapeutic activities;Therapeutic  exercise;Patient/family education;Manual techniques;Vasopneumatic Device    PT Next Visit Plan  progress shld/scap strength       Patient will benefit from skilled therapeutic intervention in order to improve the following deficits and impairments:  Pain, Decreased range of motion, Decreased strength, Impaired UE functional use, Increased edema, Impaired tone  Visit Diagnosis: Acute pain of right shoulder  Muscle weakness (generalized)     Problem List Patient Active Problem List   Diagnosis Date Noted  . Coronary artery disease   . Dysuria 02/17/2017  . Diarrhea 02/17/2017  . Vitamin D deficiency 11/07/2016  . Acute on chronic diastolic CHF (congestive heart failure), [place]HA [place] 1 ([place] 11/18/2015  . Diabetes mellitus without complication ([place] 067/20/9470 . Reactive airway disease with acute exacerbation 09/10/2014  . Nonspecific (abnormal) findings on radiological and other examination of gastrointestinal tract 04/04/2012  . Insomnia, idiopathic 10/04/2010  . Hyperlipidemia 09/22/2009  . Essential hypertension 09/22/2009  . CAD, NATIVE VESSEL 09/22/2009  . EDEMA 05/19/2009  . Hypothyroidism 02/23/2009  . EXTRINSIC ASTHMA, UNSPECIFIED 02/10/2009  . KERATOSIS, SEBORRHEIC [place]/30/2008  . SKIN CANCER, HX OF 02/14/2007  . Depression 01/24/2007  . MYOCARDIAL INFARCTION, HX OF 01/24/2007  . GERD 01/24/2007  . Osteoarthritis 01/24/2007  . COLONIC POLYPS, HX OF 01/24/2007  . IRRITABLE BOWEL SYNDROME, HX OF 01/24/2007    PAYSEUR,ANGIE PTA 09/13/2017, 10:04 AM  [address] [address]Phone: [phone]  Fax:  [phone] Name: Rebecca CORSOMRN: [id number] of Birth: 1Sep 27, 1947

## 2017-09-21 ENCOUNTER — Encounter: Payer: Self-pay | Admitting: Physical Therapy

## 2017-09-21 ENCOUNTER — Ambulatory Visit: Payer: Medicare Other | Attending: Orthopedic Surgery | Admitting: Physical Therapy

## 2017-09-21 DIAGNOSIS — M25561 Pain in right knee: Secondary | ICD-10-CM | POA: Insufficient documentation

## 2017-09-21 DIAGNOSIS — R262 Difficulty in walking, not elsewhere classified: Secondary | ICD-10-CM | POA: Insufficient documentation

## 2017-09-21 DIAGNOSIS — M6289 Other specified disorders of muscle: Secondary | ICD-10-CM | POA: Diagnosis not present

## 2017-09-21 DIAGNOSIS — M25511 Pain in right shoulder: Secondary | ICD-10-CM | POA: Insufficient documentation

## 2017-09-21 DIAGNOSIS — M25562 Pain in left knee: Secondary | ICD-10-CM | POA: Diagnosis not present

## 2017-09-21 DIAGNOSIS — M6281 Muscle weakness (generalized): Secondary | ICD-10-CM | POA: Diagnosis not present

## 2017-09-21 NOTE — Therapy (Signed)
[place] [address] [place], [place], [address] Phone: [phone]   Fax:  [phone]  Physical Therapy Evaluation  Patient Details  Name: Rebecca [name] MRN: [id number] Date of Birth: 03/03/1946 Referring Provider: Molli Barrows   Encounter Date: 09/21/2017  PT End of Session - 09/21/17 0827    Visit Number  4    Date for PT Re-Evaluation  11/02/17    PT Start Time  0750    PT Stop Time  0835    PT Time Calculation (min)  45 min    Activity Tolerance  Patient tolerated treatment well    Behavior During Therapy  [facility] for tasks assessed/performed       Past Medical History:  Diagnosis Date  . Asthma    extrinsic  . CAD (coronary artery disease)   . Chest pain, atypical   . Diabetes ([place])   . Edema   . Fatigue   . Fatigue   . GERD (gastroesophageal reflux disease)   . Hyperlipidemia   . Hypertension    familiar  . Hypothyroidism   . IBS (irritable bowel syndrome)    hx  . Keratosis, seborrheic   . Labyrinthitis   . MI (myocardial infarction) ([place])    age 38  . Osteoarthritis   . Skin cancer    Melanoma     Past Surgical History:  Procedure Laterality Date  . Breast Biopsy     dense breast tissue  . BREAST EXCISIONAL BIOPSY Left 2000   benign  . CHOLECYSTECTOMY    . EUS  04/04/2012   Procedure: UPPER ENDOSCOPIC ULTRASOUND (EUS) LINEAR;  Surgeon: Milus Banister, MD;  Location: [facility];  Service: Endoscopy;  Laterality: N/A;  radial linear  . KNEE ARTHROPLASTY     total right  02-28-2010 Dr Pilar Plate Aluisio, left  . LEFT HEART CATH AND CORONARY ANGIOGRAPHY N/A 04/20/2017   Procedure: LEFT HEART CATH AND CORONARY ANGIOGRAPHY;  Surgeon: Burnell Blanks, MD;  Location: [facility];  Service: Cardiovascular;  Laterality: N/A;  . TUBAL LIGATION      There were no vitals filed for this visit.   Subjective Assessment - 09/21/17 0756    Subjective  Patient reports that she has had  bilateral knee pain since about Thanksgiving, she reports that it was keeping her awake at night due to pain, she reports that she feels like she changed her gait.  Has a history of bilateral knee replacements about 8 years ago.  X-rays negative.  She reports that she was in about [facility] [employer].  She reports that she may have over done it.    Limitations  Walking    Patient Stated Goals  have less knee pain    Currently in Pain?  Yes    Pain Score  2     Pain Location  Knee    Pain Orientation  Left;Right    Pain Descriptors / Indicators  Pressure    Pain Type  Acute pain    Pain Onset  More than a month ago    Pain Frequency  Constant    Aggravating Factors   walking, standing up to 8/10    Pain Relieving Factors  ibuprofen, rest and ice pain can get down to 2/10    Effect of Pain on Daily Activities  walking, standing and shoping         [facility] PT Assessment - 09/21/17  0001      Assessment   Medical Diagnosis  bilateral knee pain    Referring Provider  Molli Barrows    Onset Date/Surgical Date  08/24/17    Prior Therapy  after TKR's 8 years ago      AROM   AROM Assessment Site  Knee    Right/Left Shoulder  Right;Left    Right/Left Knee  Right;Left    Right Knee Extension  3    Right Knee Flexion  122    Left Knee Extension  4    Left Knee Flexion  113      Strength   Strength Assessment Site  Knee    Right/Left Knee  Right;Left    Right Knee Flexion  4/5    Right Knee Extension  4/5    Left Knee Flexion  4/5    Left Knee Extension  4/5      Flexibility   Soft Tissue Assessment /Muscle Length  -- mild HS and calf tightness      Palpation   Palpation comment  she has lateral tracking patellae bilaterally,  //////////////////////////////////////////////////////////////////////////////////////////////////////////////////////////////////////////////////////////////////////////////////////////////////////////////////////////////////////////////////////////////////////////////////////////////////////////////////////////////////////////////////////////////////////////////////////////////////////////////////////////////////////////////////////////////////////////////////////////////////////////////////////////////////////////////////////////////////////////////////////////////////////////////////////////////////////////////////////////////////////////////////////////////////////////////////////////////////////////////////////////////////////////////////////////////////////////////////////////////////////////////////////////////////////////////////////////////////////////////////////////////////////////////////////////////////////////////////////////////////////////////////////////////////////////////////////////////////////////////////////////////////////////      Ambulation/Gait   Gait Comments  mild antalgic gait on the left             Objective measurements completed on examination: See above findings.                PT Short Term Goals - 09/21/17 0830      PT SHORT TERM GOAL #2   Title  independent with knee HEP    Time  2    Period  Weeks    Status  New        PT Long Term Goals - 09/21/17 0830      PT LONG TERM GOAL #5   Title  go up and down stairs step over step    Time  8    Period  Weeks    Status  New      Additional Long Term Goals   Additional Long Term Goals  Yes      PT LONG TERM GOAL #6   Title  increase knee ROM to full extension    Time  8    Period  Weeks    Status  New             Plan - 09/21/17 7782    Clinical Impression Statement  Patient came with new script for knee pain.  It seems like she has lateral tracking patella with some  crepitus.  She has mild tightness of the ITB, calves and piriformis mms.  The VMO is very weak and late in firing.  She reports great difficulty with stairs and walking.    PT Treatment/Interventions  Cryotherapy;Electrical Stimulation;Iontophoresis 4mg /ml Dexamethasone;Moist Heat;Therapeutic activities;Therapeutic exercise;Patient/family education;Manual techniques;Vasopneumatic Device    PT Next Visit Plan  we will continue to treat the shoulder and add treatment to the knees, could try ionto       Patient will benefit from skilled therapeutic intervention in order to improve the following deficits and impairments:  Pain, Decreased range of motion, Decreased strength, Impaired UE functional use, Increased edema, Impaired tone, Abnormal gait, Decreased balance, Decreased mobility, Impaired flexibility  Visit Diagnosis: Acute pain of right knee - Plan: PT plan of care cert/re-cert  Acute pain of left knee - Plan: PT plan of care cert/re-cert  Difficulty in walking, not elsewhere classified - Plan: PT plan of care cert/re-cert  Problem List Patient Active Problem List   Diagnosis Date Noted  . Coronary artery disease   . Dysuria 02/17/2017  . Diarrhea 02/17/2017  . Vitamin D deficiency 11/07/2016  . Acute on chronic diastolic CHF (congestive heart failure), NYHA class 1 ([place]) 11/18/2015  . Diabetes mellitus without complication ([place]) 32/44/0102  . Reactive airway disease with acute exacerbation 09/10/2014  . Nonspecific (abnormal) findings on radiological and other examination of gastrointestinal tract 04/04/2012  . Insomnia, idiopathic 10/04/2010  . Hyperlipidemia 09/22/2009  . Essential hypertension 09/22/2009  . CAD, NATIVE VESSEL 09/22/2009  . EDEMA 05/19/2009  . Hypothyroidism 02/23/2009  . EXTRINSIC ASTHMA, UNSPECIFIED 02/10/2009  . KERATOSIS, SEBORRHEIC [place] 04/16/2007  . SKIN CANCER, HX OF 02/14/2007  . Depression 01/24/2007  . MYOCARDIAL INFARCTION, HX OF 01/24/2007   . GERD 01/24/2007  . Osteoarthritis 01/24/2007  . COLONIC POLYPS, HX OF 01/24/2007  . IRRITABLE BOWEL SYNDROME, HX OF 01/24/2007    Sumner Boast., PT 09/21/2017, 8:34 AM  [address] [place], [place], [address] Phone: [phone]   Fax:  [phone]  Name: Rebecca [name] MRN: [id number] Date of Birth: 03-Jan-1946

## 2017-09-24 ENCOUNTER — Ambulatory Visit: Payer: Medicare Other | Admitting: Physical Therapy

## 2017-09-24 ENCOUNTER — Encounter: Payer: Self-pay | Admitting: Physical Therapy

## 2017-09-24 DIAGNOSIS — M6281 Muscle weakness (generalized): Secondary | ICD-10-CM | POA: Diagnosis not present

## 2017-09-24 DIAGNOSIS — R262 Difficulty in walking, not elsewhere classified: Secondary | ICD-10-CM

## 2017-09-24 DIAGNOSIS — M6289 Other specified disorders of muscle: Secondary | ICD-10-CM | POA: Diagnosis not present

## 2017-09-24 DIAGNOSIS — M25562 Pain in left knee: Secondary | ICD-10-CM

## 2017-09-24 DIAGNOSIS — M25561 Pain in right knee: Secondary | ICD-10-CM

## 2017-09-24 DIAGNOSIS — M25511 Pain in right shoulder: Secondary | ICD-10-CM | POA: Diagnosis not present

## 2017-09-24 NOTE — Therapy (Signed)
[place] [address] [place] [address] [place], [place], [address] Phone: [phone]   Fax:  [phone]  Physical Therapy Treatment  Patient Details  Name: Rebecca [name] MRN: [id number] Date of Birth: [birth year] Referring Provider: Molli Barrows   Encounter Date: 09/24/2017  PT End of Session - 09/24/17 1018    Visit Number  5    Date for PT Re-Evaluation  11/02/17    PT Start Time  0930    PT Stop Time  1018    PT Time Calculation (min)  48 min    Activity Tolerance  Patient tolerated treatment well    Behavior During Therapy  [facility] for tasks assessed/performed       Past Medical History:  Diagnosis Date  . Asthma    extrinsic  . CAD (coronary artery disease)   . Chest pain, atypical   . Diabetes ([place])   . Edema   . Fatigue   . Fatigue   . GERD (gastroesophageal reflux disease)   . Hyperlipidemia   . Hypertension    familiar  . Hypothyroidism   . IBS (irritable bowel syndrome)    hx  . Keratosis, seborrheic   . Labyrinthitis   . MI (myocardial infarction) ([place])    age 72  . Osteoarthritis   . Skin cancer    Melanoma     Past Surgical History:  Procedure Laterality Date  . Breast Biopsy     dense breast tissue  . BREAST EXCISIONAL BIOPSY Left 2000   benign  . CHOLECYSTECTOMY    . EUS  04/04/2012   Procedure: UPPER ENDOSCOPIC ULTRASOUND (EUS) LINEAR;  Surgeon: Milus Banister, MD;  [facility];  Service: Endoscopy;  Laterality: N/A;  radial linear  . KNEE ARTHROPLASTY     total right  02-28-2010 Dr Pilar Plate Aluisio, left  . LEFT HEART CATH AND CORONARY ANGIOGRAPHY N/A 04/20/2017   Procedure: LEFT HEART CATH AND CORONARY ANGIOGRAPHY;  Surgeon: Burnell Blanks, MD;  Location: [facility];  Service: Cardiovascular;  Laterality: N/A;  . TUBAL LIGATION      There were no vitals filed for this visit.  Subjective Assessment - 09/24/17 0930    Subjective  Patient reports that's he is a  little stiff from pushing the PT a lot.     Currently in Pain?  No/denies "I cant describe it"    Pain Score  0-No pain                      [facility] Adult PT Treatment/Exercise - 09/24/17 0001      Exercises   Exercises  Knee/Hip      Knee/Hip Exercises: Aerobic   Nustep  L5 x 6 min       Knee/Hip Exercises: Machines for Strengthening   Cybex Knee Extension  5lb 2x10 "Pressure"    Cybex Knee Flexion  20lb 2x10     Cybex Leg Press  20lb 2x10      Knee/Hip Exercises: Standing   Lateral Step Up  Both;2 sets;5 reps;Hand Hold: 0;Step Height: 4"      Knee/Hip Exercises: Seated   Sit to Sand  2 sets;10 reps;without UE support 2nd set with ball squeeze      Knee/Hip Exercises: Supine   Straight Leg Raise with External Rotation  Both;2 sets;10 reps      Shoulder Exercises: Standing   External Rotation  Strengthening;Both;15 reps;Theraband    Theraband Level (  Shoulder External Rotation)  Level 2 (Red)    Extension  Strengthening;Both;15 reps;Theraband    Theraband Level (Shoulder Extension)  Level 2 (Red)    Row  Strengthening;Both;15 reps;Theraband    Theraband Level (Shoulder Row)  Level 2 (Red)      Shoulder Exercises: ROM/Strengthening   Other ROM/Strengthening Exercises  rows 25# 2 sets 10; lats 20lb x10       Iontophoresis   Type of Iontophoresis  Dexamethasone    Location  RT ant shld, bilat knees    Dose  1 cc    Time  25mA leave on patch               PT Short Term Goals - 09/21/17 0830      PT SHORT TERM GOAL #2   Title  independent with knee HEP    Time  2    Period  Weeks    Status  New        PT Long Term Goals - 09/21/17 0830      PT LONG TERM GOAL #5   Title  go up and down stairs step over step    Time  8    Period  Weeks    Status  New      Additional Long Term Goals   Additional Long Term Goals  Yes      PT LONG TERM GOAL #6   Title  increase knee ROM to full extension    Time  8    Period  Weeks    Status  New             Plan - 09/24/17 1020    Clinical Impression Statement  Pt with some compensation with sit to stand using RLE more. Reports her shoulder is doing alit better. Noticeable crepitus on R knee with sit to stand. All exercises completed well. VMO is weak abel to get a contraction with SLR with ER    Rehab Potential  Good    PT Frequency  2x / week    PT Duration  8 weeks    PT Treatment/Interventions  Cryotherapy;Electrical Stimulation;Iontophoresis 4mg /ml Dexamethasone;Moist Heat;Therapeutic activities;Therapeutic exercise;Patient/family education;Manual techniques;Vasopneumatic Device    PT Next Visit Plan  we will continue to treat the shoulder and add treatment to the knees, could try ionto       Patient will benefit from skilled therapeutic intervention in order to improve the following deficits and impairments:  Pain, Decreased range of motion, Decreased strength, Impaired UE functional use, Increased edema, Impaired tone, Abnormal gait, Decreased balance, Decreased mobility, Impaired flexibility  Visit Diagnosis: Acute pain of right knee  Acute pain of left knee  Difficulty in walking, not elsewhere classified  Acute pain of right shoulder     Problem List Patient Active Problem List   Diagnosis Date Noted  . Coronary artery disease   . Dysuria 02/17/2017  . Diarrhea 02/17/2017  . Vitamin D deficiency 11/07/2016  . Acute on chronic diastolic CHF (congestive heart failure), NYHA class 1 ([place]) 11/18/2015  . Diabetes mellitus without complication ([place]) 64/40/3474  . Reactive airway disease with acute exacerbation 09/10/2014  . Nonspecific (abnormal) findings on radiological and other examination of gastrointestinal tract 04/04/2012  . Insomnia, idiopathic 10/04/2010  . Hyperlipidemia 09/22/2009  . Essential hypertension 09/22/2009  . CAD, NATIVE VESSEL 09/22/2009  . EDEMA 05/19/2009  . Hypothyroidism 02/23/2009  . EXTRINSIC ASTHMA, UNSPECIFIED 02/10/2009  .  KERATOSIS, SEBORRHEIC [place] 04/16/2007  . SKIN CANCER,  HX OF 02/14/2007  . Depression 01/24/2007  . MYOCARDIAL INFARCTION, HX OF 01/24/2007  . GERD 01/24/2007  . Osteoarthritis 01/24/2007  . COLONIC POLYPS, HX OF 01/24/2007  . IRRITABLE BOWEL SYNDROME, HX OF 01/24/2007    Scot Jun, PTA 09/24/2017, 10:22 AM  [address] [place], [place], [address] Phone: [phone]   Fax:  [phone]  Name: CLAUDIE BRICKHOUSE MRN: [id number] Date of Birth: 06/02/1946

## 2017-09-27 ENCOUNTER — Ambulatory Visit: Payer: Medicare Other | Admitting: Physical Therapy

## 2017-09-27 ENCOUNTER — Encounter: Payer: Self-pay | Admitting: Physical Therapy

## 2017-09-27 DIAGNOSIS — M6289 Other specified disorders of muscle: Secondary | ICD-10-CM | POA: Diagnosis not present

## 2017-09-27 DIAGNOSIS — R262 Difficulty in walking, not elsewhere classified: Secondary | ICD-10-CM | POA: Diagnosis not present

## 2017-09-27 DIAGNOSIS — M25511 Pain in right shoulder: Secondary | ICD-10-CM | POA: Diagnosis not present

## 2017-09-27 DIAGNOSIS — M25562 Pain in left knee: Secondary | ICD-10-CM | POA: Diagnosis not present

## 2017-09-27 DIAGNOSIS — M25561 Pain in right knee: Secondary | ICD-10-CM

## 2017-09-27 DIAGNOSIS — M6281 Muscle weakness (generalized): Secondary | ICD-10-CM | POA: Diagnosis not present

## 2017-09-27 NOTE — Therapy (Signed)
[address] [place], [place], [address] Phone: [phone]   Fax:  [phone]  Physical Therapy Treatment  Patient Details  Name: Rebecca [name] MRN: [id number] Date of Birth: 01-15-1946 Referring Provider: Molli Barrows   Encounter Date: 09/27/2017  PT End of Session - 09/27/17 1020    Visit Number  6    PT Start Time  0930    PT Stop Time  1015    PT Time Calculation (min)  45 min       Past Medical History:  Diagnosis Date  . Asthma    extrinsic  . CAD (coronary artery disease)   . Chest pain, atypical   . Diabetes ([place])   . Edema   . Fatigue   . Fatigue   . GERD (gastroesophageal reflux disease)   . Hyperlipidemia   . Hypertension    familiar  . Hypothyroidism   . IBS (irritable bowel syndrome)    hx  . Keratosis, seborrheic   . Labyrinthitis   . MI (myocardial infarction) ([place])    age 11  . Osteoarthritis   . Skin cancer    Melanoma     Past Surgical History:  Procedure Laterality Date  . Breast Biopsy     dense breast tissue  . BREAST EXCISIONAL BIOPSY Left 2000   benign  . CHOLECYSTECTOMY    . EUS  04/04/2012   Procedure: UPPER ENDOSCOPIC ULTRASOUND (EUS) LINEAR;  Surgeon: Milus Banister, MD;  [facility];  Service: Endoscopy;  Laterality: N/A;  radial linear  . KNEE ARTHROPLASTY     total right  02-28-2010 Dr Pilar Plate Aluisio, left  . LEFT HEART CATH AND CORONARY ANGIOGRAPHY N/A 04/20/2017   Procedure: LEFT HEART CATH AND CORONARY ANGIOGRAPHY;  Surgeon: Burnell Blanks, MD;  Location: [facility];  Service: Cardiovascular;  Laterality: N/A;  . TUBAL LIGATION      There were no vitals filed for this visit.  Subjective Assessment - 09/27/17 0931    Subjective  pt reports that she has been having some knee pain/pressure. Pt reports that her knees has been popping/ feels like scraping.     Currently in Pain?  Yes    Pain Score  4  with movement    Pain Location  Knee    Pain Orientation  Left;Right                      [facility] Adult PT Treatment/Exercise - 09/27/17 0001      Knee/Hip Exercises: Aerobic   Nustep  L5 x 6 min       Knee/Hip Exercises: Supine   Bridges with Ball Squeeze  Both;2 sets;10 reps    Straight Leg Raises  Both;1 set;10 reps 2lb    Other Supine Knee/Hip Exercises  SLR with add x10      Shoulder Exercises: Supine   External Rotation  10 reps;AROM;Right    Internal Rotation  10 reps;Weights;Right    Other Supine Exercises  1lb circles CW/CCW     Other Supine Exercises  serratus punches 1lb 2x10      Iontophoresis   Type of Iontophoresis  Dexamethasone    Location  RT ant shld, bilat knees    Dose  1 cc    Time  71mA leave on patch               PT Short Term Goals - 09/21/17 0830  PT SHORT TERM GOAL #2   Title  independent with knee HEP    Time  2    Period  Weeks    Status  New        PT Long Term Goals - 09/21/17 0830      PT LONG TERM GOAL #5   Title  go up and down stairs step over step    Time  8    Period  Weeks    Status  New      Additional Long Term Goals   Additional Long Term Goals  Yes      PT LONG TERM GOAL #6   Title  increase knee ROM to full extension    Time  8    Period  Weeks    Status  New            Plan - 09/27/17 1021    Clinical Impression Statement  Pt enters clinic reporting increase knee and shoulder pain so interventions were backed down. Pt L knee popping with passive knee and hip flexion in supine. Pt has a hard time relaxing to allow therapist to assess the Jt. Pt reports quad fatigue with supine exercises. She does report going to yoga yesterday after waking up with increase pain. She also does not describe the sensation a pain bit more so pressure in knees.     Rehab Potential  Good    PT Frequency  2x / week    PT Duration  8 weeks    PT Next Visit Plan  assess Tx. we will continue to treat the shoulder and add  treatment to the knees, could try ionto       Patient will benefit from skilled therapeutic intervention in order to improve the following deficits and impairments:  Pain, Decreased range of motion, Decreased strength, Impaired UE functional use, Increased edema, Impaired tone, Abnormal gait, Decreased balance, Decreased mobility, Impaired flexibility  Visit Diagnosis: Acute pain of right knee  Acute pain of right shoulder  Difficulty in walking, not elsewhere classified  Acute pain of left knee     Problem List Patient Active Problem List   Diagnosis Date Noted  . Coronary artery disease   . Dysuria 02/17/2017  . Diarrhea 02/17/2017  . Vitamin D deficiency 11/07/2016  . Acute on chronic diastolic CHF (congestive heart failure), NYHA class 1 ([place]) 11/18/2015  . Diabetes mellitus without complication ([place]) 25/42/7062  . Reactive airway disease with acute exacerbation 09/10/2014  . Nonspecific (abnormal) findings on radiological and other examination of gastrointestinal tract 04/04/2012  . Insomnia, idiopathic 10/04/2010  . Hyperlipidemia 09/22/2009  . Essential hypertension 09/22/2009  . CAD, NATIVE VESSEL 09/22/2009  . EDEMA 05/19/2009  . Hypothyroidism 02/23/2009  . EXTRINSIC ASTHMA, UNSPECIFIED 02/10/2009  . KERATOSIS, SEBORRHEIC [place] 04/16/2007  . SKIN CANCER, HX OF 02/14/2007  . Depression 01/24/2007  . MYOCARDIAL INFARCTION, HX OF 01/24/2007  . GERD 01/24/2007  . Osteoarthritis 01/24/2007  . COLONIC POLYPS, HX OF 01/24/2007  . IRRITABLE BOWEL SYNDROME, HX OF 01/24/2007    Scot Jun, PTA 09/27/2017, 10:26 AM  [place] [address] [place], [place], [address] Phone: [phone]   Fax:  [phone]  Name: Rebecca [name] MRN: [id number] Date of Birth: [birth year]

## 2017-10-01 ENCOUNTER — Encounter: Payer: Medicare Other | Admitting: Physical Therapy

## 2017-10-01 DIAGNOSIS — M25511 Pain in right shoulder: Secondary | ICD-10-CM | POA: Diagnosis not present

## 2017-10-02 ENCOUNTER — Ambulatory Visit: Payer: Medicare Other | Admitting: Physical Therapy

## 2017-10-02 DIAGNOSIS — M25511 Pain in right shoulder: Secondary | ICD-10-CM

## 2017-10-02 DIAGNOSIS — R262 Difficulty in walking, not elsewhere classified: Secondary | ICD-10-CM | POA: Diagnosis not present

## 2017-10-02 DIAGNOSIS — M25562 Pain in left knee: Secondary | ICD-10-CM

## 2017-10-02 DIAGNOSIS — M6281 Muscle weakness (generalized): Secondary | ICD-10-CM | POA: Diagnosis not present

## 2017-10-02 DIAGNOSIS — M6289 Other specified disorders of muscle: Secondary | ICD-10-CM | POA: Diagnosis not present

## 2017-10-02 DIAGNOSIS — M25561 Pain in right knee: Secondary | ICD-10-CM | POA: Diagnosis not present

## 2017-10-02 NOTE — Therapy (Signed)
[address] [place], [place], [address] Phone: [phone]   Fax:  [phone]  Physical Therapy Treatment  Patient Details  Name: Rebecca [name] MRN: [id number] Date of Birth: 1946-05-27 Referring Provider: Molli Barrows   Encounter Date: 10/02/2017  PT End of Session - 10/02/17 1015    Visit Number  7    Date for PT Re-Evaluation  11/02/17    PT Start Time  0925    PT Stop Time  1015    PT Time Calculation (min)  50 min       Past Medical History:  Diagnosis Date  . Asthma    extrinsic  . CAD (coronary artery disease)   . Chest pain, atypical   . Diabetes ([place])   . Edema   . Fatigue   . Fatigue   . GERD (gastroesophageal reflux disease)   . Hyperlipidemia   . Hypertension    familiar  . Hypothyroidism   . IBS (irritable bowel syndrome)    hx  . Keratosis, seborrheic   . Labyrinthitis   . MI (myocardial infarction) ([place])    age 87  . Osteoarthritis   . Skin cancer    Melanoma     Past Surgical History:  Procedure Laterality Date  . Breast Biopsy     dense breast tissue  . BREAST EXCISIONAL BIOPSY Left 2000   benign  . CHOLECYSTECTOMY    . EUS  04/04/2012   Procedure: UPPER ENDOSCOPIC ULTRASOUND (EUS) LINEAR;  Surgeon: Milus Banister, MD;  Location: [facility];  Service: Endoscopy;  Laterality: N/A;  radial linear  . KNEE ARTHROPLASTY     total right  02-28-2010 Dr Pilar Plate Aluisio, left  . LEFT HEART CATH AND CORONARY ANGIOGRAPHY N/A 04/20/2017   Procedure: LEFT HEART CATH AND CORONARY ANGIOGRAPHY;  Surgeon: Burnell Blanks, MD;  Location: [facility];  Service: Cardiovascular;  Laterality: N/A;  . TUBAL LIGATION      There were no vitals filed for this visit.  Subjective Assessment - 10/02/17 0924    Subjective  better tahn last week. saw shld MD yesterday and he was pleased- "hot spots are better"    Currently in Pain?  Yes    Pain Score  4          [facility]  PT Assessment - 10/02/17 0001      AROM   AROM Assessment Site  Shoulder    Right/Left Shoulder  Right    Right Shoulder Flexion  168 Degrees    Right Shoulder ABduction  180 Degrees    Right Shoulder Internal Rotation  70 Degrees                  [facility] Adult PT Treatment/Exercise - 10/02/17 0001      Knee/Hip Exercises: Aerobic   Nustep  L 4 6 min      Knee/Hip Exercises: Standing   Other Standing Knee Exercises  3# hip flex, marching ,abd and HS 15 times BIL      Knee/Hip Exercises: Seated   Long Arc Quad  Strengthening;Both;2 sets;10 reps;Weights    Long Arc Quad Weight  3 lbs.    Ball Squeeze  15 times 3 sec    Marching  Strengthening;Both;15 reps green tband    Abduction/Adduction   Strengthening;Both;2 sets;15 reps green tband      Shoulder Exercises: Standing   External Rotation  Strengthening;Both;15 reps;Theraband    Theraband  Level (Shoulder External Rotation)  Level 2 (Red)    Extension  Strengthening;Both;15 reps;Theraband    Theraband Level (Shoulder Extension)  Level 2 (Red)    Row  Strengthening;Both;15 reps;Theraband    Theraband Level (Shoulder Row)  Level 2 (Red)    Other Standing Exercises  2# PNF and empty can 2 sets 10 2# IR/ER at side 2 sets 10      Iontophoresis   Type of Iontophoresis  Dexamethasone    Location  RT ant shld, bilat knees    Dose  1 cc    Time  46mA leave on patch             PT Education - 10/02/17 1014    Education provided  Yes    Education Details  light weight and muscle isolation to stab joints    Person(s) Educated  Patient    Methods  Explanation    Comprehension  Verbalized understanding       PT Short Term Goals - 09/21/17 0830      PT SHORT TERM GOAL #2   Title  independent with knee HEP    Time  2    Period  Weeks    Status  New        PT Long Term Goals - 09/21/17 0830      PT LONG TERM GOAL #5   Title  go up and down stairs step over step    Time  8    Period  Weeks    Status  New       Additional Long Term Goals   Additional Long Term Goals  Yes      PT LONG TERM GOAL #6   Title  increase knee ROM to full extension    Time  8    Period  Weeks    Status  New            Plan - 10/02/17 1015    Clinical Impression Statement  minimal pain with session today,increased ROM and less pain. explained light wt and muscle isolation to support joints better    PT Treatment/Interventions  Cryotherapy;Electrical Stimulation;Iontophoresis 4mg /ml Dexamethasone;Moist Heat;Therapeutic activities;Therapeutic exercise;Patient/family education;Manual techniques;Vasopneumatic Device    PT Next Visit Plan  progress strength with light weights       Patient will benefit from skilled therapeutic intervention in order to improve the following deficits and impairments:  Pain, Decreased range of motion, Decreased strength, Impaired UE functional use, Increased edema, Impaired tone, Abnormal gait, Decreased balance, Decreased mobility, Impaired flexibility  Visit Diagnosis: Acute pain of right knee  Acute pain of right shoulder  Difficulty in walking, not elsewhere classified  Acute pain of left knee  Muscle weakness (generalized)     Problem List Patient Active Problem List   Diagnosis Date Noted  . Coronary artery disease   . Dysuria 02/17/2017  . Diarrhea 02/17/2017  . Vitamin D deficiency 11/07/2016  . Acute on chronic diastolic CHF (congestive heart failure), NYHA class 1 ([place]) 11/18/2015  . Diabetes mellitus without complication ([place]) 58/52/7782  . Reactive airway disease with acute exacerbation 09/10/2014  . Nonspecific (abnormal) findings on radiological and other examination of gastrointestinal tract 04/04/2012  . Insomnia, idiopathic 10/04/2010  . Hyperlipidemia 09/22/2009  . Essential hypertension 09/22/2009  . CAD, NATIVE VESSEL 09/22/2009  . EDEMA 05/19/2009  . Hypothyroidism 02/23/2009  . EXTRINSIC ASTHMA, UNSPECIFIED 02/10/2009  . KERATOSIS,  SEBORRHEIC [place] 04/16/2007  . SKIN CANCER, HX OF 02/14/2007  .  Depression 01/24/2007  . MYOCARDIAL INFARCTION, HX OF 01/24/2007  . GERD 01/24/2007  . Osteoarthritis 01/24/2007  . COLONIC POLYPS, HX OF 01/24/2007  . IRRITABLE BOWEL SYNDROME, HX OF 01/24/2007    PAYSEUR,ANGIE PTA 10/02/2017, 10:17 AM  [address] [place], [place], [address] Phone: [phone]   Fax:  [phone]  Name: INDIRA SORENSON MRN: [id number] Date of Birth: [birth year]

## 2017-10-04 ENCOUNTER — Encounter: Payer: Self-pay | Admitting: Physical Therapy

## 2017-10-04 ENCOUNTER — Ambulatory Visit: Payer: Medicare Other | Admitting: Physical Therapy

## 2017-10-04 DIAGNOSIS — M25562 Pain in left knee: Secondary | ICD-10-CM

## 2017-10-04 DIAGNOSIS — M25561 Pain in right knee: Secondary | ICD-10-CM | POA: Diagnosis not present

## 2017-10-04 DIAGNOSIS — R262 Difficulty in walking, not elsewhere classified: Secondary | ICD-10-CM | POA: Diagnosis not present

## 2017-10-04 DIAGNOSIS — M6289 Other specified disorders of muscle: Secondary | ICD-10-CM | POA: Diagnosis not present

## 2017-10-04 DIAGNOSIS — M25511 Pain in right shoulder: Secondary | ICD-10-CM | POA: Diagnosis not present

## 2017-10-04 DIAGNOSIS — M6281 Muscle weakness (generalized): Secondary | ICD-10-CM

## 2017-10-04 NOTE — Therapy (Signed)
[address] [place], [place], [address] Phone: [phone]   Fax:  [phone]  Physical Therapy Treatment  Patient Details  Name: Rebecca [name] MRN: [id number] Date of Birth: 25-Jun-1946 Referring Provider: Molli Barrows   Encounter Date: 10/04/2017  PT End of Session - 10/04/17 1236    Visit Number  8    Date for PT Re-Evaluation  11/02/17    PT Start Time  1012    PT Stop Time  1100    PT Time Calculation (min)  48 min    Activity Tolerance  Patient tolerated treatment well    Behavior During Therapy  [facility] for tasks assessed/performed       Past Medical History:  Diagnosis Date  . Asthma    extrinsic  . CAD (coronary artery disease)   . Chest pain, atypical   . Diabetes ([place])   . Edema   . Fatigue   . Fatigue   . GERD (gastroesophageal reflux disease)   . Hyperlipidemia   . Hypertension    familiar  . Hypothyroidism   . IBS (irritable bowel syndrome)    hx  . Keratosis, seborrheic   . Labyrinthitis   . MI (myocardial infarction) ([place])    age 78  . Osteoarthritis   . Skin cancer    Melanoma     Past Surgical History:  Procedure Laterality Date  . Breast Biopsy     dense breast tissue  . BREAST EXCISIONAL BIOPSY Left 2000   benign  . CHOLECYSTECTOMY    . EUS  04/04/2012   Procedure: UPPER ENDOSCOPIC ULTRASOUND (EUS) LINEAR;  Surgeon: Milus Banister, MD;  [facility]: [facility];  Service: Endoscopy;  Laterality: N/A;  radial linear  . KNEE ARTHROPLASTY     total right  02-28-2010 Dr Pilar Plate Aluisio, left  . LEFT HEART CATH AND CORONARY ANGIOGRAPHY N/A 04/20/2017   Procedure: LEFT HEART CATH AND CORONARY ANGIOGRAPHY;  Surgeon: Burnell Blanks, MD;  Location: [facility];  Service: Cardiovascular;  Laterality: N/A;  . TUBAL LIGATION      There were no vitals filed for this visit.  Subjective Assessment - 10/04/17 1013    Subjective  "I feel like I am doing better"   Knees still hurting    Currently in Pain?  Yes    Pain Score  4     Pain Location  Knee    Pain Orientation  Right;Left    Pain Descriptors / Indicators  Burning;Pressure    Pain Frequency  Constant    Aggravating Factors   reports shopping yesterday and pain up to 6/10    Pain Relieving Factors  rest, ice ibuprofen still helping but reports that the pain never goes away                      [facility] Adult PT Treatment/Exercise - 10/04/17 0001      Knee/Hip Exercises: Stretches   Passive Hamstring Stretch  3 reps;20 seconds    Piriformis Stretch  3 reps;20 seconds      Knee/Hip Exercises: Aerobic   Nustep  L 4 and L 5 x 6 min      Knee/Hip Exercises: Standing   Walking with Sports Cord  fwd and backward 40 feet    Other Standing Knee Exercises  3# hip flex, marching ,abd and HS 15 times BIL, then again 10x      Knee/Hip Exercises:  Seated   Long Arc Quad  Strengthening;Both;2 sets;10 reps;Weights    Long Arc Quad Weight  3 lbs.    Ball Squeeze  15 times 3 sec      Knee/Hip Exercises: Supine   Other Supine Knee/Hip Exercises  feet on ball K2C, trunk rotation, bridges and isometric abs      Shoulder Exercises: Seated   External Rotation  Right;10 reps;Theraband    Theraband Level (Shoulder External Rotation)  Level 2 (Red)      Shoulder Exercises: Standing   External Rotation  Strengthening;Both;15 reps;Theraband    Theraband Level (Shoulder External Rotation)  Level 2 (Red)    Extension  Strengthening;Both;15 reps;Theraband    Theraband Level (Shoulder Extension)  Level 2 (Red)    Row  Strengthening;Both;15 reps;Theraband    Theraband Level (Shoulder Row)  Level 2 (Red)               PT Short Term Goals - 09/21/17 0830      PT SHORT TERM GOAL #2   Title  independent with knee HEP    Time  2    Period  Weeks    Status  New        PT Long Term Goals - 10/04/17 1238      PT LONG TERM GOAL #1   Title  decrease pain 50% for ADLs    Status   On-going      PT LONG TERM GOAL #2   Title  increase overall shoulder strength 5/5 for functional use     Status  On-going            Plan - 10/04/17 1236    Clinical Impression Statement  Reported no problems with the exercises, she did have some popping in the knees with exercises.    PT Next Visit Plan  progress strength with light weights    Consulted and Agree with Plan of Care  Patient       Patient will benefit from skilled therapeutic intervention in order to improve the following deficits and impairments:  Pain, Decreased range of motion, Decreased strength, Impaired UE functional use, Increased edema, Impaired tone, Abnormal gait, Decreased balance, Decreased mobility, Impaired flexibility  Visit Diagnosis: Acute pain of right knee  Acute pain of right shoulder  Difficulty in walking, not elsewhere classified  Acute pain of left knee  Muscle weakness (generalized)  Muscle tone increased     Problem List Patient Active Problem List   Diagnosis Date Noted  . Coronary artery disease   . Dysuria 02/17/2017  . Diarrhea 02/17/2017  . Vitamin D deficiency 11/07/2016  . Acute on chronic diastolic CHF (congestive heart failure), NYHA class 1 ([place]) 11/18/2015  . Diabetes mellitus without complication ([place]) 05/10/8526  . Reactive airway disease with acute exacerbation 09/10/2014  . Nonspecific (abnormal) findings on radiological and other examination of gastrointestinal tract 04/04/2012  . Insomnia, idiopathic 10/04/2010  . Hyperlipidemia 09/22/2009  . Essential hypertension 09/22/2009  . CAD, NATIVE VESSEL 09/22/2009  . EDEMA 05/19/2009  . Hypothyroidism 02/23/2009  . EXTRINSIC ASTHMA, UNSPECIFIED 02/10/2009  . KERATOSIS, SEBORRHEIC [place] 04/16/2007  . SKIN CANCER, HX OF 02/14/2007  . Depression 01/24/2007  . MYOCARDIAL INFARCTION, HX OF 01/24/2007  . GERD 01/24/2007  . Osteoarthritis 01/24/2007  . COLONIC POLYPS, HX OF 01/24/2007  . IRRITABLE BOWEL  SYNDROME, HX OF 01/24/2007    Sumner Boast., PT 10/04/2017, 12:39 PM  [address]  [place], [place], [address] Phone: [phone]   Fax:  [phone]  Name: Rebecca [name] MRN: [id number] Date of Birth: 1946/03/28

## 2017-10-08 ENCOUNTER — Ambulatory Visit: Payer: Medicare Other | Admitting: Physical Therapy

## 2017-10-08 DIAGNOSIS — M6281 Muscle weakness (generalized): Secondary | ICD-10-CM | POA: Diagnosis not present

## 2017-10-08 DIAGNOSIS — M6289 Other specified disorders of muscle: Secondary | ICD-10-CM | POA: Diagnosis not present

## 2017-10-08 DIAGNOSIS — M25511 Pain in right shoulder: Secondary | ICD-10-CM | POA: Diagnosis not present

## 2017-10-08 DIAGNOSIS — R262 Difficulty in walking, not elsewhere classified: Secondary | ICD-10-CM

## 2017-10-08 DIAGNOSIS — M25561 Pain in right knee: Secondary | ICD-10-CM | POA: Diagnosis not present

## 2017-10-08 DIAGNOSIS — M25562 Pain in left knee: Secondary | ICD-10-CM

## 2017-10-08 NOTE — Therapy (Signed)
[address] [place], [place], [address] Phone: [phone]   Fax:  [phone]  Physical Therapy Treatment  Patient Details  Name: Rebecca [name] MRN: [id number] Date of Birth: Aug 23, 1945 Referring Provider: Molli Barrows   Encounter Date: 10/08/2017  PT End of Session - 10/08/17 1005    Visit Number  9    Date for PT Re-Evaluation  11/02/17    PT Start Time  0925    PT Stop Time  1010    PT Time Calculation (min)  45 min       Past Medical History:  Diagnosis Date  . Asthma    extrinsic  . CAD (coronary artery disease)   . Chest pain, atypical   . Diabetes ([place])   . Edema   . Fatigue   . Fatigue   . GERD (gastroesophageal reflux disease)   . Hyperlipidemia   . Hypertension    familiar  . Hypothyroidism   . IBS (irritable bowel syndrome)    hx  . Keratosis, seborrheic   . Labyrinthitis   . MI (myocardial infarction) ([place])    age 19  . Osteoarthritis   . Skin cancer    Melanoma     Past Surgical History:  Procedure Laterality Date  . Breast Biopsy     dense breast tissue  . BREAST EXCISIONAL BIOPSY Left 2000   benign  . CHOLECYSTECTOMY    . EUS  04/04/2012   Procedure: UPPER ENDOSCOPIC ULTRASOUND (EUS) LINEAR;  Surgeon: Milus Banister, MD;  Location: [facility];  Service: Endoscopy;  Laterality: N/A;  radial linear  . KNEE ARTHROPLASTY     total right  02-28-2010 Dr Pilar Plate Aluisio, left  . LEFT HEART CATH AND CORONARY ANGIOGRAPHY N/A 04/20/2017   Procedure: LEFT HEART CATH AND CORONARY ANGIOGRAPHY;  Surgeon: Burnell Blanks, MD;  Location: [facility];  Service: Cardiovascular;  Laterality: N/A;  . TUBAL LIGATION      There were no vitals filed for this visit.  Subjective Assessment - 10/08/17 0925    Subjective  50% better overall, very pleased    Currently in Pain?  Yes    Pain Score  2     Pain Location  Knee    Pain Orientation  Right;Left          [facility] PT Assessment - 10/08/17 0001      AROM   Right Shoulder Flexion  180 Degrees    Right Shoulder Internal Rotation  85 Degrees    Right Knee Extension  0    Right Knee Flexion  125    Left Knee Extension  2    Left Knee Flexion  122            No data recorded       [facility] Adult PT Treatment/Exercise - 10/08/17 0001      Knee/Hip Exercises: Aerobic   Nustep  L 5 6 min      Knee/Hip Exercises: Machines for Strengthening   Cybex Knee Extension  5lb 3x  with ball squeeze    Cybex Leg Press  20lb 2x10 left knee not pain but pressure      Knee/Hip Exercises: Standing   Other Standing Knee Exercises  3# hip flex, marching ,abd , ext and HS 15 times BIL      Knee/Hip Exercises: Seated   Long Arc Quad  Strengthening;Both;2 sets;10 reps;Weights some popping but NO pain  Long Arc Quad Weight  3 lbs.    Ball Squeeze  15 times 3 sec    Other Seated Knee/Hip Exercises  3# step up and out 15 each    Marching  Strengthening;Both;15 reps green tband    Abduction/Adduction   Strengthening;Both;2 sets;15 reps green tband      Knee/Hip Exercises: Supine   Straight Leg Raises  Both;1 set;10 reps    Straight Leg Raise with External Rotation  -- in ER    Other Supine Knee/Hip Exercises  feet on ball K2C, trunk rotation, bridges and isometric abs      Shoulder Exercises: Supine   Other Supine Exercises  2# UE ex    Other Supine Exercises  wt ball supine ex UE      Shoulder Exercises: ROM/Strengthening   Other ROM/Strengthening Exercises  row 20# 2 sets 10  focus on posture               PT Short Term Goals - 09/21/17 0830      PT SHORT TERM GOAL #2   Title  independent with knee HEP    Time  2    Period  Weeks    Status  New        PT Long Term Goals - 10/04/17 1238      PT LONG TERM GOAL #1   Title  decrease pain 50% for ADLs    Status  On-going      PT LONG TERM GOAL #2   Title  increase overall shoulder strength 5/5 for functional use      Status  On-going            Plan - 10/08/17 1009    Clinical Impression Statement  excellent ROM in shld and knees. started machines again and pain with knee ext with pain so stopped. some cuing with ex for slow/controlled mvmt. at times still amb with decreased knee flexion in swing phase    PT Treatment/Interventions  Cryotherapy;Electrical Stimulation;Iontophoresis 4mg /ml Dexamethasone;Moist Heat;Therapeutic activities;Therapeutic exercise;Patient/family education;Manual techniques;Vasopneumatic Device    PT Next Visit Plan  progress strength with light weights       Patient will benefit from skilled therapeutic intervention in order to improve the following deficits and impairments:  Pain, Decreased range of motion, Decreased strength, Impaired UE functional use, Increased edema, Impaired tone, Abnormal gait, Decreased balance, Decreased mobility, Impaired flexibility  Visit Diagnosis: Acute pain of right knee  Acute pain of right shoulder  Difficulty in walking, not elsewhere classified  Acute pain of left knee  Muscle weakness (generalized)     Problem List Patient Active Problem List   Diagnosis Date Noted  . Coronary artery disease   . Dysuria 02/17/2017  . Diarrhea 02/17/2017  . Vitamin D deficiency 11/07/2016  . Acute on chronic diastolic CHF (congestive heart failure), NYHA class 1 ([place]) 11/18/2015  . Diabetes mellitus without complication ([place]) 96/10/5407  . Reactive airway disease with acute exacerbation 09/10/2014  . Nonspecific (abnormal) findings on radiological and other examination of gastrointestinal tract 04/04/2012  . Insomnia, idiopathic 10/04/2010  . Hyperlipidemia 09/22/2009  . Essential hypertension 09/22/2009  . CAD, NATIVE VESSEL 09/22/2009  . EDEMA 05/19/2009  . Hypothyroidism 02/23/2009  . EXTRINSIC ASTHMA, UNSPECIFIED 02/10/2009  . KERATOSIS, SEBORRHEIC [place] 04/16/2007  . SKIN CANCER, HX OF 02/14/2007  . Depression 01/24/2007  .  MYOCARDIAL INFARCTION, HX OF 01/24/2007  . GERD 01/24/2007  . Osteoarthritis 01/24/2007  . COLONIC POLYPS, HX OF 01/24/2007  . IRRITABLE  BOWEL SYNDROME, HX OF 01/24/2007    Tarra Pence,ANGIE PTA 10/08/2017, 10:11 AM  [address] [place], [place], [address] Phone: [phone]   Fax:  [phone]  Name: Rebecca [name] MRN: [id number] Date of Birth: [birth year]

## 2017-10-10 DIAGNOSIS — Z96651 Presence of right artificial knee joint: Secondary | ICD-10-CM | POA: Diagnosis not present

## 2017-10-10 DIAGNOSIS — M25862 Other specified joint disorders, left knee: Secondary | ICD-10-CM | POA: Diagnosis not present

## 2017-10-10 DIAGNOSIS — M25869 Other specified joint disorders, unspecified knee: Secondary | ICD-10-CM | POA: Diagnosis not present

## 2017-10-10 DIAGNOSIS — Z96652 Presence of left artificial knee joint: Secondary | ICD-10-CM | POA: Diagnosis not present

## 2017-10-11 ENCOUNTER — Ambulatory Visit: Payer: Medicare Other | Admitting: Physical Therapy

## 2017-10-11 DIAGNOSIS — R262 Difficulty in walking, not elsewhere classified: Secondary | ICD-10-CM | POA: Diagnosis not present

## 2017-10-11 DIAGNOSIS — M25511 Pain in right shoulder: Secondary | ICD-10-CM | POA: Diagnosis not present

## 2017-10-11 DIAGNOSIS — M25562 Pain in left knee: Secondary | ICD-10-CM | POA: Diagnosis not present

## 2017-10-11 DIAGNOSIS — M25561 Pain in right knee: Secondary | ICD-10-CM

## 2017-10-11 DIAGNOSIS — M6289 Other specified disorders of muscle: Secondary | ICD-10-CM | POA: Diagnosis not present

## 2017-10-11 DIAGNOSIS — M6281 Muscle weakness (generalized): Secondary | ICD-10-CM

## 2017-10-11 NOTE — Therapy (Signed)
[address] [place], [place], [address] Phone: [phone]   Fax:  [phone]  Physical Therapy Treatment  Patient Details  Name: Rebecca [name] MRN: [id number] Date of Birth: 1946-07-15 Referring Provider: Molli Barrows   Encounter Date: 10/11/2017  PT End of Session - 10/11/17 1006    Visit Number  10    Date for PT Re-Evaluation  11/02/17    PT Start Time  0925    PT Stop Time  1008    PT Time Calculation (min)  43 min       Past Medical History:  Diagnosis Date  . Asthma    extrinsic  . CAD (coronary artery disease)   . Chest pain, atypical   . Diabetes ([place])   . Edema   . Fatigue   . Fatigue   . GERD (gastroesophageal reflux disease)   . Hyperlipidemia   . Hypertension    familiar  . Hypothyroidism   . IBS (irritable bowel syndrome)    hx  . Keratosis, seborrheic   . Labyrinthitis   . MI (myocardial infarction) ([place])    age 72  . Osteoarthritis   . Skin cancer    Melanoma     Past Surgical History:  Procedure Laterality Date  . Breast Biopsy     dense breast tissue  . BREAST EXCISIONAL BIOPSY Left 2000   benign  . CHOLECYSTECTOMY    . EUS  04/04/2012   Procedure: UPPER ENDOSCOPIC ULTRASOUND (EUS) LINEAR;  Surgeon: Milus Banister, MD;  Location: [facility];  Service: Endoscopy;  Laterality: N/A;  radial linear  . KNEE ARTHROPLASTY     total right  02-28-2010 Dr Pilar Plate Aluisio, left  . LEFT HEART CATH AND CORONARY ANGIOGRAPHY N/A 04/20/2017   Procedure: LEFT HEART CATH AND CORONARY ANGIOGRAPHY;  Surgeon: Burnell Blanks, MD;  Location: [facility];  Service: Cardiovascular;  Laterality: N/A;  . TUBAL LIGATION      There were no vitals filed for this visit.  Subjective Assessment - 10/11/17 0926    Subjective  shld flared ( using it alot more)- aleve and ice. knees better- saw MD ? bakers cyst    Currently in Pain?  Yes    Pain Score  3                  No data recorded       [facility] Adult PT Treatment/Exercise - 10/11/17 0001      Knee/Hip Exercises: Aerobic   Nustep  L 4 6 min      Knee/Hip Exercises: Standing   Other Standing Knee Exercises  3# hip flex, marching ,abd , ext  15 reps      Knee/Hip Exercises: Seated   Long Arc Quad  Strengthening;Both;2 sets;Weights;15 reps    Long Arc Quad Weight  3 lbs.    Ball Squeeze  20 times 3 sec    Marching  Strengthening;Both;2 sets;15 reps;Weights    Marching Weights  3 lbs.    Hamstring Curl  Strengthening;Both;2 sets;10 reps;Weights     Hamstring Weights  3 lbs.    Abduction/Adduction   Strengthening;Both;2 sets;15 reps;Weights    Abd/Adduction Limitations  3      Shoulder Exercises: Standing   Other Standing Exercises  scap stab 15 reps      Manual Therapy   Manual Therapy  Soft tissue mobilization;Joint mobilization;Taping    Joint Mobilization  jt capsule stretch  Soft tissue mobilization  large trigger point RT [place] RT UT             PT Education - 10/11/17 1005    Education provided  Yes    Education Details  decreasing shld compensations    Person(s) Educated  Patient    Methods  Explanation;Demonstration    Comprehension  Verbalized understanding;Returned demonstration       PT Short Term Goals - 09/21/17 0830      PT SHORT TERM GOAL #2   Title  independent with knee HEP    Time  2    Period  Weeks    Status  New        PT Long Term Goals - 10/11/17 5993      PT LONG TERM GOAL #1   Title  decrease pain 50% for ADLs    Status  Partially Met      PT LONG TERM GOAL #2   Title  increase overall shoulder strength 5/5 for functional use     Status  Partially Met      PT LONG TERM GOAL #3   Title  reports attending normal exercise classes regularly without pain or limitation for QOL    Baseline  not resumed yet    Status  On-going      PT LONG TERM GOAL #4   Title  reports being able to do  hair without pain or limitation    Status  Partially Met      PT LONG TERM GOAL #5   Title  go up and down stairs step over step    Status  Partially Met      PT LONG TERM GOAL #6   Title  increase knee ROM to full extension    Baseline  met on RT, 2 on Left    Status  Partially Met            Plan - 10/11/17 1006    Clinical Impression Statement  trigger pts in RT trap that responded well to STW, trial of tape. worked on scap stab and decreasing compensations. progressing with goals    PT Treatment/Interventions  Cryotherapy;Electrical Stimulation;Iontophoresis '4mg'$ /ml Dexamethasone;Moist Heat;Therapeutic activities;Therapeutic exercise;Patient/family education;Manual techniques;Vasopneumatic Device    PT Next Visit Plan  progress strength with light weights       Patient will benefit from skilled therapeutic intervention in order to improve the following deficits and impairments:  Pain, Decreased range of motion, Decreased strength, Impaired UE functional use, Increased edema, Impaired tone, Abnormal gait, Decreased balance, Decreased mobility, Impaired flexibility  Visit Diagnosis: Acute pain of right knee  Acute pain of right shoulder  Difficulty in walking, not elsewhere classified  Acute pain of left knee  Muscle weakness (generalized)     Problem List Patient Active Problem List   Diagnosis Date Noted  . Coronary artery disease   . Dysuria 02/17/2017  . Diarrhea 02/17/2017  . Vitamin D deficiency 11/07/2016  . Acute on chronic diastolic CHF (congestive heart failure), NYHA class 1 ([place]) 11/18/2015  . Diabetes mellitus without complication ([place]) 57/07/7791  . Reactive airway disease with acute exacerbation 09/10/2014  . Nonspecific (abnormal) findings on radiological and other examination of gastrointestinal tract 04/04/2012  . Insomnia, idiopathic 10/04/2010  . Hyperlipidemia 09/22/2009  . Essential hypertension 09/22/2009  . CAD, NATIVE VESSEL 09/22/2009   . EDEMA 05/19/2009  . Hypothyroidism 02/23/2009  . EXTRINSIC ASTHMA, UNSPECIFIED 02/10/2009  . KERATOSIS,  SEBORRHEIC [place] 04/16/2007  . SKIN CANCER, HX OF 02/14/2007  . Depression 01/24/2007  . MYOCARDIAL INFARCTION, HX OF 01/24/2007  . GERD 01/24/2007  . Osteoarthritis 01/24/2007  . COLONIC POLYPS, HX OF 01/24/2007  . IRRITABLE BOWEL SYNDROME, HX OF 01/24/2007    Fernandez Kenley,ANGIE PTA 10/11/2017, 10:07 AM  [address] [place], [place], [address] Phone: [phone]   Fax:  [phone]  Name: Rebecca [name] MRN: [id number] Date of Birth: 21-Nov-1945

## 2017-10-15 ENCOUNTER — Encounter: Payer: Self-pay | Admitting: Physical Therapy

## 2017-10-15 ENCOUNTER — Ambulatory Visit: Payer: Medicare Other | Attending: Orthopedic Surgery | Admitting: Physical Therapy

## 2017-10-15 ENCOUNTER — Ambulatory Visit: Payer: Medicare Other | Admitting: Physical Therapy

## 2017-10-15 DIAGNOSIS — M6289 Other specified disorders of muscle: Secondary | ICD-10-CM | POA: Diagnosis not present

## 2017-10-15 DIAGNOSIS — M6281 Muscle weakness (generalized): Secondary | ICD-10-CM

## 2017-10-15 DIAGNOSIS — M25561 Pain in right knee: Secondary | ICD-10-CM

## 2017-10-15 DIAGNOSIS — R262 Difficulty in walking, not elsewhere classified: Secondary | ICD-10-CM

## 2017-10-15 DIAGNOSIS — M25511 Pain in right shoulder: Secondary | ICD-10-CM

## 2017-10-15 DIAGNOSIS — M25562 Pain in left knee: Secondary | ICD-10-CM

## 2017-10-15 NOTE — Therapy (Signed)
[address] [place], [place], [address] Phone: [phone]   Fax:  [phone]  Physical Therapy Treatment  Patient Details  Name: Rebecca [name] MRN: [id number] Date of Birth: 15-Jul-1946 Referring Provider: Molli Barrows   Encounter Date: 10/15/2017  PT End of Session - 10/15/17 1304    Visit Number  11    Date for PT Re-Evaluation  11/02/17    PT Start Time  1013    PT Stop Time  1111    PT Time Calculation (min)  58 min    Activity Tolerance  Patient tolerated treatment well    Behavior During Therapy  [facility] for tasks assessed/performed       Past Medical History:  Diagnosis Date  . Asthma    extrinsic  . CAD (coronary artery disease)   . Chest pain, atypical   . Diabetes ([place])   . Edema   . Fatigue   . Fatigue   . GERD (gastroesophageal reflux disease)   . Hyperlipidemia   . Hypertension    familiar  . Hypothyroidism   . IBS (irritable bowel syndrome)    hx  . Keratosis, seborrheic   . Labyrinthitis   . MI (myocardial infarction) ([place])    age 8  . Osteoarthritis   . Skin cancer    Melanoma     Past Surgical History:  Procedure Laterality Date  . Breast Biopsy     dense breast tissue  . BREAST EXCISIONAL BIOPSY Left 2000   benign  . CHOLECYSTECTOMY    . EUS  04/04/2012   Procedure: UPPER ENDOSCOPIC ULTRASOUND (EUS) LINEAR;  Surgeon: Milus Banister, MD;  [facility];  Service: Endoscopy;  Laterality: N/A;  radial linear  . KNEE ARTHROPLASTY     total right  02-28-2010 Dr Pilar Plate Aluisio, left  . LEFT HEART CATH AND CORONARY ANGIOGRAPHY N/A 04/20/2017   Procedure: LEFT HEART CATH AND CORONARY ANGIOGRAPHY;  Surgeon: Burnell Blanks, MD;  Location: [facility];  Service: Cardiovascular;  Laterality: N/A;  . TUBAL LIGATION      There were no vitals filed for this visit.  Subjective Assessment - 10/15/17 1013    Subjective  Reports that the upper trap was  pretty sore after the last treatment, reports that the tape may have helped.  C/O some pain in the right lateral upper arm    Currently in Pain?  Yes    Pain Score  4     Pain Location  Shoulder knees    Pain Orientation  Right    Aggravating Factors   sleeping on the right side increases right shoulder pain                       [facility] Adult PT Treatment/Exercise - 10/15/17 0001      Knee/Hip Exercises: Aerobic   Nustep  L 4 6 min      Knee/Hip Exercises: Machines for Strengthening   Cybex Leg Press  20# x 10, no weight both legs going low x10, then single legs no wegiht x10 each      Knee/Hip Exercises: Standing   Walking with Sports Cord  black tband all directions      Knee/Hip Exercises: Seated   Long Arc Quad  Strengthening;Both;2 sets;Weights;15 reps    Long Arc Quad Weight  3 lbs.      Knee/Hip Exercises: Supine   Quad Sets  2  sets;10 reps;Both;Limitations    Quad Sets Limitations  3# with slight ER      Knee/Hip Exercises: Sidelying   Hip ADduction  Both;2 sets;10 reps;Limitations    Hip ADduction Limitations  1.5#      Shoulder Exercises: Standing   External Rotation  Strengthening;Both;15 reps;Theraband    Theraband Level (Shoulder External Rotation)  Level 2 (Red)    Extension  Strengthening;Both;15 reps;Theraband    Theraband Level (Shoulder Extension)  Level 2 (Red)    Row  Strengthening;Both;15 reps;Theraband    Theraband Level (Shoulder Row)  Level 2 (Red)    Other Standing Exercises  overhead carry 4#      Shoulder Exercises: ROM/Strengthening   "W" Arms  15x back to wall    Other ROM/Strengthening Exercises  bck to wall overhead ball press    Other ROM/Strengthening Exercises  wand exercises for end ROM especiall extension to stretch the shoulder      Modalities   Modalities  Moist Heat;Electrical Stimulation      Moist Heat Therapy   Number Minutes Moist Heat  15 Minutes    Moist Heat Location  Shoulder      Electrical Stimulation    Electrical Stimulation Location  right upper trap area    Electrical Stimulation Action  IFC    Electrical Stimulation Parameters  sitting    Electrical Stimulation Goals  Pain      Manual Therapy   Manual Therapy  Passive ROM    Passive ROM  stretch to end range all GH joint motions               PT Short Term Goals - 09/21/17 0830      PT SHORT TERM GOAL #2   Title  independent with knee HEP    Time  2    Period  Weeks    Status  New        PT Long Term Goals - 10/11/17 1610      PT LONG TERM GOAL #1   Title  decrease pain 50% for ADLs    Status  Partially Met      PT LONG TERM GOAL #2   Title  increase overall shoulder strength 5/5 for functional use     Status  Partially Met      PT LONG TERM GOAL #3   Title  reports attending normal exercise classes regularly without pain or limitation for QOL    Baseline  not resumed yet    Status  On-going      PT LONG TERM GOAL #4   Title  reports being able to do hair without pain or limitation    Status  Partially Met      PT LONG TERM GOAL #5   Title  go up and down stairs step over step    Status  Partially Met      PT LONG TERM GOAL #6   Title  increase knee ROM to full extension    Baseline  met on RT, 2 on Left    Status  Partially Met            Plan - 10/15/17 1305    Clinical Impression Statement  Patient did well with all exercises, c/o less pain and popping with the exercises, reports feeling stronger.  Still with spasms in the upper trap.       Patient will benefit from skilled therapeutic intervention in order to improve the following deficits and impairments:  Pain, Decreased range of motion, Decreased strength, Impaired UE functional use, Increased edema, Impaired tone, Abnormal gait, Decreased balance, Decreased mobility, Impaired flexibility  Visit Diagnosis: Acute pain of right knee  Acute pain of right shoulder  Difficulty in walking, not elsewhere classified  Acute pain of  left knee  Muscle weakness (generalized)  Muscle tone increased     Problem List Patient Active Problem List   Diagnosis Date Noted  . Coronary artery disease   . Dysuria 02/17/2017  . Diarrhea 02/17/2017  . Vitamin D deficiency 11/07/2016  . Acute on chronic diastolic CHF (congestive heart failure), NYHA class 1 ([place]) 11/18/2015  . Diabetes mellitus without complication ([place]) 02/27/4817  . Reactive airway disease with acute exacerbation 09/10/2014  . Nonspecific (abnormal) findings on radiological and other examination of gastrointestinal tract 04/04/2012  . Insomnia, idiopathic 10/04/2010  . Hyperlipidemia 09/22/2009  . Essential hypertension 09/22/2009  . CAD, NATIVE VESSEL 09/22/2009  . EDEMA 05/19/2009  . Hypothyroidism 02/23/2009  . EXTRINSIC ASTHMA, UNSPECIFIED 02/10/2009  . KERATOSIS, SEBORRHEIC [place] 04/16/2007  . SKIN CANCER, HX OF 02/14/2007  . Depression 01/24/2007  . MYOCARDIAL INFARCTION, HX OF 01/24/2007  . GERD 01/24/2007  . Osteoarthritis 01/24/2007  . COLONIC POLYPS, HX OF 01/24/2007  . IRRITABLE BOWEL SYNDROME, HX OF 01/24/2007    Sumner Boast., PT 10/15/2017, 1:06 PM  [address] [place], [place], [address] Phone: [phone]   Fax:  [phone]  Name: Rebecca [name] MRN: [id number] Date of Birth: Apr 04, 1946

## 2017-10-17 ENCOUNTER — Encounter: Payer: Self-pay | Admitting: Family Medicine

## 2017-10-18 ENCOUNTER — Ambulatory Visit: Payer: Medicare Other | Admitting: Physical Therapy

## 2017-10-18 DIAGNOSIS — M25562 Pain in left knee: Secondary | ICD-10-CM

## 2017-10-18 DIAGNOSIS — M25511 Pain in right shoulder: Secondary | ICD-10-CM | POA: Diagnosis not present

## 2017-10-18 DIAGNOSIS — M6281 Muscle weakness (generalized): Secondary | ICD-10-CM | POA: Diagnosis not present

## 2017-10-18 DIAGNOSIS — M25561 Pain in right knee: Secondary | ICD-10-CM

## 2017-10-18 DIAGNOSIS — R262 Difficulty in walking, not elsewhere classified: Secondary | ICD-10-CM | POA: Diagnosis not present

## 2017-10-18 DIAGNOSIS — M6289 Other specified disorders of muscle: Secondary | ICD-10-CM | POA: Diagnosis not present

## 2017-10-18 NOTE — Therapy (Signed)
[address] [place], [place], [address] Phone: [phone]   Fax:  [phone]  Physical Therapy Treatment  Patient Details  Name: Rebecca [name] MRN: [id number] Date of Birth: 05/24/1946 Referring Provider: Molli Barrows   Encounter Date: 10/18/2017  PT End of Session - 10/18/17 1008    Visit Number  12    Date for PT Re-Evaluation  11/02/17    PT Start Time  0930    PT Stop Time  1020    PT Time Calculation (min)  50 min       Past Medical History:  Diagnosis Date  . Asthma    extrinsic  . CAD (coronary artery disease)   . Chest pain, atypical   . Diabetes ([place])   . Edema   . Fatigue   . Fatigue   . GERD (gastroesophageal reflux disease)   . Hyperlipidemia   . Hypertension    familiar  . Hypothyroidism   . IBS (irritable bowel syndrome)    hx  . Keratosis, seborrheic   . Labyrinthitis   . MI (myocardial infarction) ([place])    age 39  . Osteoarthritis   . Skin cancer    Melanoma     Past Surgical History:  Procedure Laterality Date  . Breast Biopsy     dense breast tissue  . BREAST EXCISIONAL BIOPSY Left 2000   benign  . CHOLECYSTECTOMY    . EUS  04/04/2012   Procedure: UPPER ENDOSCOPIC ULTRASOUND (EUS) LINEAR;  Surgeon: Milus Banister, MD;  [facility];  Service: Endoscopy;  Laterality: N/A;  radial linear  . KNEE ARTHROPLASTY     total right  02-28-2010 Dr Pilar Plate Aluisio, left  . KNEE ARTHROPLASTY Bilateral 09/12/2017   [place]  . LEFT HEART CATH AND CORONARY ANGIOGRAPHY N/A 04/20/2017   Procedure: LEFT HEART CATH AND CORONARY ANGIOGRAPHY;  Surgeon: Burnell Blanks, MD;  Location: [facility];  Service: Cardiovascular;  Laterality: N/A;  . TUBAL LIGATION      There were no vitals filed for this visit.  Subjective Assessment - 10/18/17 0930    Subjective  RT lat arm increased pain. knees pop but decreased pain    Currently in Pain?  Yes    Pain  Score  5     Pain Location  Shoulder    Pain Orientation  Right                       [facility] Adult PT Treatment/Exercise - 10/18/17 0001      Knee/Hip Exercises: Aerobic   Nustep  L 5 6 min      Knee/Hip Exercises: Standing   Lateral Step Up  Both;2 sets;15 reps;Step Height: 2";Step Height: 6"    Forward Step Up  Both;15 reps;Hand Hold: 2;Step Height: 6"      Knee/Hip Exercises: Seated   Long Arc Quad  Strengthening;Both;2 sets;Weights;15 reps    Long Arc Quad Weight  3 lbs.    Ball Squeeze  20 times 3 sec    Marching  Strengthening;Both;2 sets;15 reps;Weights tband    Hamstring Curl  Strengthening;Both;2 sets;15 reps;Weights    Hamstring Limitations  3    Abduction/Adduction   Strengthening;Both;20 reps tband      Shoulder Exercises: Seated   Other Seated Exercises  3# row,horz abd,ext and ER      Iontophoresis   Type of Iontophoresis  Dexamethasone  Location  RT ant shld over bicep incertion    Dose  1.2 cc    Time  38m leave on patch      Manual Therapy   Manual Therapy  Joint mobilization;Soft tissue mobilization;Passive ROM    Joint Mobilization  jt capsule stretch    Soft tissue mobilization  bicep tendon and incertion    Passive ROM  stretch to end range all GH joint motions               PT Short Term Goals - 09/21/17 0830      PT SHORT TERM GOAL #2   Title  independent with knee HEP    Time  2    Period  Weeks    Status  New        PT Long Term Goals - 10/11/17 04098     PT LONG TERM GOAL #1   Title  decrease pain 50% for ADLs    Status  Partially Met      PT LONG TERM GOAL #2   Title  increase overall shoulder strength 5/5 for functional use     Status  Partially Met      PT LONG TERM GOAL #3   Title  reports attending normal exercise classes regularly without pain or limitation for QOL    Baseline  not resumed yet    Status  On-going      PT LONG TERM GOAL #4   Title  reports being able to do hair without pain or  limitation    Status  Partially Met      PT LONG TERM GOAL #5   Title  go up and down stairs step over step    Status  Partially Met      PT LONG TERM GOAL #6   Title  increase knee ROM to full extension    Baseline  met on RT, 2 on Left    Status  Partially Met            Plan - 10/18/17 1009    Clinical Impression Statement  increased RT lat arm pain with increased tenderness in bicep tendon and muscle belly. fatigue in quads with increased reps    PT Treatment/Interventions  Cryotherapy;Electrical Stimulation;Iontophoresis 438mml Dexamethasone;Moist Heat;Therapeutic activities;Therapeutic exercise;Patient/family education;Manual techniques;Vasopneumatic Device    PT Next Visit Plan  progress strength with light weights       Patient will benefit from skilled therapeutic intervention in order to improve the following deficits and impairments:  Pain, Decreased range of motion, Decreased strength, Impaired UE functional use, Increased edema, Impaired tone, Abnormal gait, Decreased balance, Decreased mobility, Impaired flexibility  Visit Diagnosis: Acute pain of right knee  Acute pain of right shoulder  Difficulty in walking, not elsewhere classified  Acute pain of left knee  Muscle weakness (generalized)     Problem List Patient Active Problem List   Diagnosis Date Noted  . Coronary artery disease   . Dysuria 02/17/2017  . Diarrhea 02/17/2017  . Vitamin D deficiency 11/07/2016  . Acute on chronic diastolic CHF (congestive heart failure), NYHA class 1 ([place]05/10/2015  . Diabetes mellitus without complication ([id number]. Reactive airway disease with acute exacerbation 09/10/2014  . Nonspecific (abnormal) findings on radiological and other examination of gastrointestinal tract 04/04/2012  . Insomnia, idiopathic 10/04/2010  . Hyperlipidemia 09/22/2009  . Essential hypertension 09/22/2009  . CAD, NATIVE VESSEL 09/22/2009  . EDEMA 05/19/2009  .  Hypothyroidism 02/23/2009  .  EXTRINSIC ASTHMA, UNSPECIFIED 02/10/2009  . KERATOSIS, SEBORRHEIC [place] 04/16/2007  . SKIN CANCER, HX OF 02/14/2007  . Depression 01/24/2007  . MYOCARDIAL INFARCTION, HX OF 01/24/2007  . GERD 01/24/2007  . Osteoarthritis 01/24/2007  . COLONIC POLYPS, HX OF 01/24/2007  . IRRITABLE BOWEL SYNDROME, HX OF 01/24/2007    Shawntia Mangal,ANGIE PTA 10/18/2017, 10:10 AM  [address] [place], [place], [address] Phone: [phone]   Fax:  [phone]  Name: Rebecca [name] MRN: [id number] Date of Birth: [birth year]

## 2017-10-22 ENCOUNTER — Encounter: Payer: Self-pay | Admitting: Physical Therapy

## 2017-10-22 ENCOUNTER — Ambulatory Visit: Payer: Medicare Other | Admitting: Physical Therapy

## 2017-10-22 DIAGNOSIS — M6289 Other specified disorders of muscle: Secondary | ICD-10-CM

## 2017-10-22 DIAGNOSIS — M25511 Pain in right shoulder: Secondary | ICD-10-CM

## 2017-10-22 DIAGNOSIS — M6281 Muscle weakness (generalized): Secondary | ICD-10-CM | POA: Diagnosis not present

## 2017-10-22 DIAGNOSIS — M25561 Pain in right knee: Secondary | ICD-10-CM | POA: Diagnosis not present

## 2017-10-22 DIAGNOSIS — M25562 Pain in left knee: Secondary | ICD-10-CM | POA: Diagnosis not present

## 2017-10-22 DIAGNOSIS — R262 Difficulty in walking, not elsewhere classified: Secondary | ICD-10-CM

## 2017-10-22 NOTE — Therapy (Signed)
[address] [place], [place], [address] Phone: [phone]   Fax:  [phone]  Physical Therapy Treatment  Patient Details  Name: BRITTNAY PIGMAN MRN: [id number] Date of Birth: April 04, 1946 Referring Provider: Molli Barrows   Encounter Date: 10/22/2017  PT End of Session - 10/22/17 1108    Visit Number  13    Date for PT Re-Evaluation  11/02/17    PT Start Time  0930    PT Stop Time  1020    PT Time Calculation (min)  50 min    Activity Tolerance  Patient tolerated treatment well    Behavior During Therapy  [facility] for tasks assessed/performed       Past Medical History:  Diagnosis Date  . Asthma    extrinsic  . CAD (coronary artery disease)   . Chest pain, atypical   . Diabetes ([place])   . Edema   . Fatigue   . Fatigue   . GERD (gastroesophageal reflux disease)   . Hyperlipidemia   . Hypertension    familiar  . Hypothyroidism   . IBS (irritable bowel syndrome)    hx  . Keratosis, seborrheic   . Labyrinthitis   . MI (myocardial infarction) ([place])    age 60  . Osteoarthritis   . Skin cancer    Melanoma     Past Surgical History:  Procedure Laterality Date  . Breast Biopsy     dense breast tissue  . BREAST EXCISIONAL BIOPSY Left 2000   benign  . CHOLECYSTECTOMY    . EUS  04/04/2012   Procedure: UPPER ENDOSCOPIC ULTRASOUND (EUS) LINEAR;  Surgeon: Milus Banister, MD;  Location: [facility];  Service: Endoscopy;  Laterality: N/A;  radial linear  . KNEE ARTHROPLASTY     total right  02-28-2010 Dr Pilar Plate Aluisio, left  . KNEE ARTHROPLASTY Bilateral 09/12/2017   Hector Shade  . LEFT HEART CATH AND CORONARY ANGIOGRAPHY N/A 04/20/2017   Procedure: LEFT HEART CATH AND CORONARY ANGIOGRAPHY;  Surgeon: Burnell Blanks, MD;  Location: [place] [facility];  Service: Cardiovascular;  Laterality: N/A;  . TUBAL LIGATION      There were no vitals filed for this visit.  Subjective Assessment -  10/22/17 0927    Subjective  Patient continues to report that the right anterior and lateral shoulder is very tender and sore.  I think that is the spot    Currently in Pain?  Yes    Pain Score  4     Pain Location  Shoulder    Pain Orientation  Right    Pain Descriptors / Indicators  Sharp    Aggravating Factors   reaching up, doing hair really hurts up to 5-6/10                       [facility] Adult PT Treatment/Exercise - 10/22/17 0001      Knee/Hip Exercises: Stretches   Passive Hamstring Stretch  3 reps;20 seconds    Quad Stretch  3 reps;20 seconds;Both    ITB Stretch  Both;3 reps;20 seconds    Piriformis Stretch  Both;3 reps;20 seconds      Knee/Hip Exercises: Aerobic   Nustep  L 5 6 min      Knee/Hip Exercises: Seated   Long Arc Quad  Strengthening;Both;2 sets;Weights;15 reps    Ball Squeeze  20 times 3 sec    Other Seated Knee/Hip Exercises  hip extension  3#    Hamstring Curl  Strengthening;Both;2 sets;15 reps;Weights    Hamstring Limitations  3    Abduction/Adduction   Both;2 sets;10 reps;Weights    Abd/Adduction Weights  3 lbs.      Shoulder Exercises: Seated   External Rotation  Both;20 reps;Theraband    Theraband Level (Shoulder External Rotation)  Level 2 (Red)    Other Seated Exercises  3# row,horz abd,ext and ER 2x10 bilateral, cues for scapular retraction and form      Shoulder Exercises: Standing   Other Standing Exercises  triceps 20#, rows 15# both 2x15      Shoulder Exercises: Stretch   Internal Rotation Stretch  10 seconds    External Rotation Stretch  3 reps;10 seconds    Other Shoulder Stretches  doorway stretch 3 sets 20 seconds      Iontophoresis   Time  had some irritation in the biceps area from the last patch so did not do it today      Manual Therapy   Joint Mobilization  jt capsule stretch    Soft tissue mobilization  to right upper trap, deltoid and teres area               PT Short Term Goals - 09/21/17 0830       PT SHORT TERM GOAL #2   Title  independent with knee HEP    Time  2    Period  Weeks    Status  New        PT Long Term Goals - 10/11/17 4818      PT LONG TERM GOAL #1   Title  decrease pain 50% for ADLs    Status  Partially Met      PT LONG TERM GOAL #2   Title  increase overall shoulder strength 5/5 for functional use     Status  Partially Met      PT LONG TERM GOAL #3   Title  reports attending normal exercise classes regularly without pain or limitation for QOL    Baseline  not resumed yet    Status  On-going      PT LONG TERM GOAL #4   Title  reports being able to do hair without pain or limitation    Status  Partially Met      PT LONG TERM GOAL #5   Title  go up and down stairs step over step    Status  Partially Met      PT LONG TERM GOAL #6   Title  increase knee ROM to full extension    Baseline  met on RT, 2 on Left    Status  Partially Met            Plan - 10/22/17 1108    Clinical Impression Statement  Patient reports overall less popping and pain in the knees, she report that the shoulder is more the problem now, she is tight int he upper trap and the deltoid, seems to have some biceps origin issues, she still had som eirritation from teh dex so we did not do that today    PT Next Visit Plan  progress strength with light weights    Consulted and Agree with Plan of Care  Patient       Patient will benefit from skilled therapeutic intervention in order to improve the following deficits and impairments:  Pain, Decreased range of motion, Decreased strength, Impaired UE functional use, Increased edema, Impaired tone,  Abnormal gait, Decreased balance, Decreased mobility, Impaired flexibility  Visit Diagnosis: Acute pain of right knee  Acute pain of right shoulder  Difficulty in walking, not elsewhere classified  Acute pain of left knee  Muscle weakness (generalized)  Muscle tone increased     Problem List Patient Active Problem List    Diagnosis Date Noted  . Coronary artery disease   . Dysuria 02/17/2017  . Diarrhea 02/17/2017  . Vitamin D deficiency 11/07/2016  . Acute on chronic diastolic CHF (congestive heart failure), NYHA class 1 ([place]) 11/18/2015  . Diabetes mellitus without complication ([place]) 32/44/0102  . Reactive airway disease with acute exacerbation 09/10/2014  . Nonspecific (abnormal) findings on radiological and other examination of gastrointestinal tract 04/04/2012  . Insomnia, idiopathic 10/04/2010  . Hyperlipidemia 09/22/2009  . Essential hypertension 09/22/2009  . CAD, NATIVE VESSEL 09/22/2009  . EDEMA 05/19/2009  . Hypothyroidism 02/23/2009  . EXTRINSIC ASTHMA, UNSPECIFIED 02/10/2009  . KERATOSIS, SEBORRHEIC [place] 04/16/2007  . SKIN CANCER, HX OF 02/14/2007  . Depression 01/24/2007  . MYOCARDIAL INFARCTION, HX OF 01/24/2007  . GERD 01/24/2007  . Osteoarthritis 01/24/2007  . COLONIC POLYPS, HX OF 01/24/2007  . IRRITABLE BOWEL SYNDROME, HX OF 01/24/2007    Sumner Boast., PT 10/22/2017, 11:10 AM  [address] [place], [place], [address] Phone: [phone]   Fax:  [phone]  Name: SAHMYA ARAI MRN: [id number] Date of Birth: Mar 30, 1946

## 2017-10-25 ENCOUNTER — Ambulatory Visit: Payer: Medicare Other | Admitting: Physical Therapy

## 2017-10-25 DIAGNOSIS — M6289 Other specified disorders of muscle: Secondary | ICD-10-CM | POA: Diagnosis not present

## 2017-10-25 DIAGNOSIS — M6281 Muscle weakness (generalized): Secondary | ICD-10-CM

## 2017-10-25 DIAGNOSIS — M25511 Pain in right shoulder: Secondary | ICD-10-CM | POA: Diagnosis not present

## 2017-10-25 DIAGNOSIS — M25562 Pain in left knee: Secondary | ICD-10-CM

## 2017-10-25 DIAGNOSIS — M25561 Pain in right knee: Secondary | ICD-10-CM

## 2017-10-25 DIAGNOSIS — R262 Difficulty in walking, not elsewhere classified: Secondary | ICD-10-CM | POA: Diagnosis not present

## 2017-10-25 NOTE — Therapy (Signed)
[address] [place], [place], [address] Phone: [phone]   Fax:  [phone]  Physical Therapy Treatment  Patient Details  Name: Rebecca [name] MRN: [id number] Date of Birth: August 23, 1945 Referring Provider: Molli Barrows   Encounter Date: 10/25/2017  PT End of Session - 10/25/17 1002    Visit Number  14    Date for PT Re-Evaluation  11/02/17    PT Start Time  0927    PT Stop Time  1020    PT Time Calculation (min)  53 min       Past Medical History:  Diagnosis Date  . Asthma    extrinsic  . CAD (coronary artery disease)   . Chest pain, atypical   . Diabetes ([place])   . Edema   . Fatigue   . Fatigue   . GERD (gastroesophageal reflux disease)   . Hyperlipidemia   . Hypertension    familiar  . Hypothyroidism   . IBS (irritable bowel syndrome)    hx  . Keratosis, seborrheic   . Labyrinthitis   . MI (myocardial infarction) ([place])    age 72  . Osteoarthritis   . Skin cancer    Melanoma     Past Surgical History:  Procedure Laterality Date  . Breast Biopsy     dense breast tissue  . BREAST EXCISIONAL BIOPSY Left 2000   benign  . CHOLECYSTECTOMY    . EUS  04/04/2012   Procedure: UPPER ENDOSCOPIC ULTRASOUND (EUS) LINEAR;  Surgeon: Milus Banister, MD;  [facility];  Service: Endoscopy;  Laterality: N/A;  radial linear  . KNEE ARTHROPLASTY     total right  02-28-2010 Dr Pilar Plate Aluisio, left  . KNEE ARTHROPLASTY Bilateral 09/12/2017   Hector Shade  . LEFT HEART CATH AND CORONARY ANGIOGRAPHY N/A 04/20/2017   Procedure: LEFT HEART CATH AND CORONARY ANGIOGRAPHY;  Surgeon: Burnell Blanks, MD;  Location: [place] [facility];  Service: Cardiovascular;  Laterality: N/A;  . TUBAL LIGATION      There were no vitals filed for this visit.  Subjective Assessment - 10/25/17 0927    Subjective  shld is just a mess, moves around, but knees are 90% better. I warmed up before I came    Currently in Pain?  Yes    Pain Score  4     Pain Location  Shoulder    Pain Orientation  Right                       [facility] Adult PT Treatment/Exercise - 10/25/17 0001      Knee/Hip Exercises: Machines for Strengthening   Cybex Knee Extension  5# with ball squueze 3 sets 5 no pain, in past very painfull    Cybex Knee Flexion  20# 3 sets10    Cybex Leg Press  20# with ball squeeze 2 sets 10      Knee/Hip Exercises: Seated   Long Arc Quad  Strengthening;Both;5 reps blue tband- cuing to achieve full ext and sit upright    Ball Squeeze  20 times 3 sec    Marching  Strengthening;Both;15 reps blue tband    Hamstring Curl  Strengthening;Both;15 reps blue tband    Abduction/Adduction   Strengthening;Both;20 reps blue tband      Moist Heat Therapy   Number Minutes Moist Heat  15 Minutes    Moist Heat Location  Shoulder      Manual  Therapy   Manual Therapy  Passive ROM;Soft tissue mobilization    Soft tissue mobilization  RT UT/rhom and cerv- several large trigger pts    Passive ROM  RT shld - FULL. AROM [facility]       Trigger Point Dry Needling - 10/25/17 0958    Consent Given?  Yes    Education Handout Provided  Yes    Muscles Treated Upper Body  Upper trapezius;Levator scapulae    Upper Trapezius Response  Twitch reponse elicited    Levator Scapulae Response  Twitch response elicited             PT Short Term Goals - 09/21/17 0830      PT SHORT TERM GOAL #2   Title  independent with knee HEP    Time  2    Period  Weeks    Status  New        PT Long Term Goals - 10/25/17 2355      PT LONG TERM GOAL #1   Title  decrease pain 50% for ADLs    Status  Partially Met      PT LONG TERM GOAL #2   Title  increase overall shoulder strength 5/5 for functional use     Status  Partially Met      PT LONG TERM GOAL #3   Title  reports attending normal exercise classes regularly without pain or limitation for QOL    Status  Partially Met      PT LONG TERM  GOAL #4   Title  reports being able to do hair without pain or limitation    Status  Partially Met      PT LONG TERM GOAL #5   Title  go up and down stairs step over step    Status  Partially Met      PT LONG TERM GOAL #6   Title  increase knee ROM to full extension    Status  Achieved            Plan - 10/25/17 0957    Clinical Impression Statement  progressing with goals. needs cuing forcore activation with knee ex and still some pain with TKE. large trigger pts in RT cerv/UT which is most likely d/t UE compensation with pain    PT Treatment/Interventions  Cryotherapy;Electrical Stimulation;Iontophoresis 13m/ml Dexamethasone;Moist Heat;Therapeutic activities;Therapeutic exercise;Patient/family education;Manual techniques;Vasopneumatic Device    PT Next Visit Plan  assess and progress       Patient will benefit from skilled therapeutic intervention in order to improve the following deficits and impairments:  Pain, Decreased range of motion, Decreased strength, Impaired UE functional use, Increased edema, Impaired tone, Abnormal gait, Decreased balance, Decreased mobility, Impaired flexibility  Visit Diagnosis: Acute pain of right knee  Acute pain of right shoulder  Difficulty in walking, not elsewhere classified  Acute pain of left knee  Muscle weakness (generalized)     Problem List Patient Active Problem List   Diagnosis Date Noted  . Coronary artery disease   . Dysuria 02/17/2017  . Diarrhea 02/17/2017  . Vitamin D deficiency 11/07/2016  . Acute on chronic diastolic CHF (congestive heart failure), NYHA class 1 ([place] 11/18/2015  . Diabetes mellitus without complication ([place] 073/22/0254 . Reactive airway disease with acute exacerbation 09/10/2014  . Nonspecific (abnormal) findings on radiological and other examination of gastrointestinal tract 04/04/2012  . Insomnia, idiopathic 10/04/2010  . Hyperlipidemia 09/22/2009  . Essential hypertension 09/22/2009  .  CAD, NATIVE VESSEL 09/22/2009  .  EDEMA 05/19/2009  . Hypothyroidism 02/23/2009  . EXTRINSIC ASTHMA, UNSPECIFIED 02/10/2009  . KERATOSIS, SEBORRHEIC [place] 04/16/2007  . SKIN CANCER, HX OF 02/14/2007  . Depression 01/24/2007  . MYOCARDIAL INFARCTION, HX OF 01/24/2007  . GERD 01/24/2007  . Osteoarthritis 01/24/2007  . COLONIC POLYPS, HX OF 01/24/2007  . IRRITABLE BOWEL SYNDROME, HX OF 01/24/2007    Adalina Dopson,ANGIE  PTA 10/25/2017, 10:03 AM  [address] [place], [place], [address] Phone: [phone]   Fax:  [phone]  Name: Rebecca [name] MRN: [id number] Date of Birth: 02-19-1946

## 2017-10-25 NOTE — Patient Instructions (Signed)

## 2017-10-30 ENCOUNTER — Ambulatory Visit: Payer: Medicare Other | Admitting: Physical Therapy

## 2017-10-30 DIAGNOSIS — M25511 Pain in right shoulder: Secondary | ICD-10-CM | POA: Diagnosis not present

## 2017-10-30 DIAGNOSIS — M25562 Pain in left knee: Secondary | ICD-10-CM

## 2017-10-30 DIAGNOSIS — M6281 Muscle weakness (generalized): Secondary | ICD-10-CM | POA: Diagnosis not present

## 2017-10-30 DIAGNOSIS — R262 Difficulty in walking, not elsewhere classified: Secondary | ICD-10-CM | POA: Diagnosis not present

## 2017-10-30 DIAGNOSIS — M6289 Other specified disorders of muscle: Secondary | ICD-10-CM | POA: Diagnosis not present

## 2017-10-30 DIAGNOSIS — M25561 Pain in right knee: Secondary | ICD-10-CM

## 2017-10-30 NOTE — Therapy (Signed)
[address] [place], [place], [address] Phone: [phone]   Fax:  [phone]  Physical Therapy Treatment  Patient Details  Name: Rebecca [name] MRN: [id number] Date of Birth: Sep 24, 1945 Referring Provider: Molli Barrows   Encounter Date: 10/30/2017  PT End of Session - 10/30/17 1049    Visit Number  15    Date for PT Re-Evaluation  11/02/17    PT Start Time  1012    PT Stop Time  1110    PT Time Calculation (min)  58 min       Past Medical History:  Diagnosis Date  . Asthma    extrinsic  . CAD (coronary artery disease)   . Chest pain, atypical   . Diabetes ([place])   . Edema   . Fatigue   . Fatigue   . GERD (gastroesophageal reflux disease)   . Hyperlipidemia   . Hypertension    familiar  . Hypothyroidism   . IBS (irritable bowel syndrome)    hx  . Keratosis, seborrheic   . Labyrinthitis   . MI (myocardial infarction) ([place])    age 58  . Osteoarthritis   . Skin cancer    Melanoma     Past Surgical History:  Procedure Laterality Date  . Breast Biopsy     dense breast tissue  . BREAST EXCISIONAL BIOPSY Left 2000   benign  . CHOLECYSTECTOMY    . EUS  04/04/2012   Procedure: UPPER ENDOSCOPIC ULTRASOUND (EUS) LINEAR;  Surgeon: Milus Banister, MD;  Location: [facility];  Service: Endoscopy;  Laterality: N/A;  radial linear  . KNEE ARTHROPLASTY     total right  02-28-2010 Dr Pilar Plate Aluisio, left  . KNEE ARTHROPLASTY Bilateral 09/12/2017   H[place] Shade  . LEFT HEART CATH AND CORONARY ANGIOGRAPHY N/A 04/20/2017   Procedure: LEFT HEART CATH AND CORONARY ANGIOGRAPHY;  Surgeon: Burnell Blanks, MD;  Location: [place] [facility];  Service: Cardiovascular;  Laterality: N/A;  . TUBAL LIGATION      There were no vitals filed for this visit.  Subjective Assessment - 10/30/17 1014    Subjective  DN helped alot, RT cerv knot. busy weekend so increased knee pressure    Currently in Pain?   Yes    Pain Score  3                        [facility] Adult PT Treatment/Exercise - 10/30/17 0001      Knee/Hip Exercises: Aerobic   Nustep  L 5 6 min      Knee/Hip Exercises: Machines for Strengthening   Cybex Knee Extension  5# with ball squeeze 2 sets 8 a few pops and pain. PTA lat support on left decreased pain     Cybex Knee Flexion  20# 3 sets10    Cybex Leg Press  20# with ball squeeze 2 sets 10      Knee/Hip Exercises: Seated   Sit to Sand  1 set;10 reps;without UE support ball squeeze      Shoulder Exercises: Seated   Other Seated Exercises  row 15# 2 sets 10 lat pull 15# 2 set s10      Shoulder Exercises: Standing   External Rotation  Strengthening;Both;15 reps;Weights 3#    External Rotation Weight (lbs)  3    Extension  Strengthening;Both;15 reps;Weights    Extension Weight (lbs)  3    Row  Strengthening;Both;15  reps;Weights    Row Weight (lbs)  3      Moist Heat Therapy   Number Minutes Moist Heat  15 Minutes    Moist Heat Location  Shoulder;Cervical      Electrical Stimulation   Electrical Stimulation Location  RT UT and cerv    Electrical Stimulation Action  IFC    Electrical Stimulation Parameters  sitting    Electrical Stimulation Goals  Pain      Manual Therapy   Manual Therapy  Soft tissue mobilization    Manual therapy comments  trigger pt release RT trap/rhom and cerv    Soft tissue mobilization  RT cerv with SNAGS and NAGS with lat flex               PT Short Term Goals - 09/21/17 0830      PT SHORT TERM GOAL #2   Title  independent with knee HEP    Time  2    Period  Weeks    Status  New        PT Long Term Goals - 10/25/17 0956      PT LONG TERM GOAL #1   Title  decrease pain 50% for ADLs    Status  Partially Met      PT LONG TERM GOAL #2   Title  increase overall shoulder strength 5/5 for functional use     Status  Partially Met      PT LONG TERM GOAL #3   Title  reports attending normal exercise classes  regularly without pain or limitation for QOL    Status  Partially Met      PT LONG TERM GOAL #4   Title  reports being able to do hair without pain or limitation    Status  Partially Met      PT LONG TERM GOAL #5   Title  go up and down stairs step over step    Status  Partially Met      PT LONG TERM GOAL #6   Title  increase knee ROM to full extension    Status  Achieved            Plan - 10/30/17 1049    Clinical Impression Statement  tolerating increase ex ,some left kne lat tracking which resolved with overpressure. trigger pts RT cerv and UT - that responded well to depp STW with rtrigger pt release    PT Treatment/Interventions  Cryotherapy;Electrical Stimulation;Iontophoresis 54m/ml Dexamethasone;Moist Heat;Therapeutic activities;Therapeutic exercise;Patient/family education;Manual techniques;Vasopneumatic Device    PT Next Visit Plan  check goals. possible DN       Patient will benefit from skilled therapeutic intervention in order to improve the following deficits and impairments:  Pain, Decreased range of motion, Decreased strength, Impaired UE functional use, Increased edema, Impaired tone, Abnormal gait, Decreased balance, Decreased mobility, Impaired flexibility  Visit Diagnosis: Acute pain of right knee  Acute pain of right shoulder  Difficulty in walking, not elsewhere classified  Acute pain of left knee  Muscle weakness (generalized)     Problem List Patient Active Problem List   Diagnosis Date Noted  . Coronary artery disease   . Dysuria 02/17/2017  . Diarrhea 02/17/2017  . Vitamin D deficiency 11/07/2016  . Acute on chronic diastolic CHF (congestive heart failure), NYHA class 1 ([place] 11/18/2015  . Diabetes mellitus without complication ([place] 073/22/0254 . Reactive airway disease with acute exacerbation 09/10/2014  . Nonspecific (abnormal) findings on radiological and other examination  of gastrointestinal tract 04/04/2012  . Insomnia,  idiopathic 10/04/2010  . Hyperlipidemia 09/22/2009  . Essential hypertension 09/22/2009  . CAD, NATIVE VESSEL 09/22/2009  . EDEMA 05/19/2009  . Hypothyroidism 02/23/2009  . EXTRINSIC ASTHMA, UNSPECIFIED 02/10/2009  . KERATOSIS, SEBORRHEIC [place] 04/16/2007  . SKIN CANCER, HX OF 02/14/2007  . Depression 01/24/2007  . MYOCARDIAL INFARCTION, HX OF 01/24/2007  . GERD 01/24/2007  . Osteoarthritis 01/24/2007  . COLONIC POLYPS, HX OF 01/24/2007  . IRRITABLE BOWEL SYNDROME, HX OF 01/24/2007    Rashel Okeefe,ANGIE PTA 10/30/2017, 10:51 AM  [address] [place], [place], [address] Phone: [phone]   Fax:  [phone]  Name: Rebecca [name] MRN: [id number] Date of Birth: Feb 02, 1946

## 2017-11-01 ENCOUNTER — Ambulatory Visit: Payer: Medicare Other | Admitting: Physical Therapy

## 2017-11-01 DIAGNOSIS — M6289 Other specified disorders of muscle: Secondary | ICD-10-CM | POA: Diagnosis not present

## 2017-11-01 DIAGNOSIS — M25511 Pain in right shoulder: Secondary | ICD-10-CM | POA: Diagnosis not present

## 2017-11-01 DIAGNOSIS — M6281 Muscle weakness (generalized): Secondary | ICD-10-CM | POA: Diagnosis not present

## 2017-11-01 DIAGNOSIS — M25562 Pain in left knee: Secondary | ICD-10-CM

## 2017-11-01 DIAGNOSIS — M25561 Pain in right knee: Secondary | ICD-10-CM | POA: Diagnosis not present

## 2017-11-01 DIAGNOSIS — R262 Difficulty in walking, not elsewhere classified: Secondary | ICD-10-CM

## 2017-11-01 NOTE — Therapy (Signed)
[facility] [address] [place], [place], [address] Phone: [phone]   Fax:  [phone]  Physical Therapy Treatment  Patient Details  Name: Rebecca [name] MRN: [id number] Date of Birth: [birth year] Referring Provider: Molli Barrows   Encounter Date: 11/01/2017  PT End of Session - 11/01/17 1037    Visit Number  16    Date for PT Re-Evaluation  11/02/17    PT Start Time  1010    PT Stop Time  1105    PT Time Calculation (min)  55 min       Past Medical History:  Diagnosis Date  . Asthma    extrinsic  . CAD (coronary artery disease)   . Chest pain, atypical   . Diabetes ([place])   . Edema   . Fatigue   . Fatigue   . GERD (gastroesophageal reflux disease)   . Hyperlipidemia   . Hypertension    familiar  . Hypothyroidism   . IBS (irritable bowel syndrome)    hx  . Keratosis, seborrheic   . Labyrinthitis   . MI (myocardial infarction) ([place])    age 28  . Osteoarthritis   . Skin cancer    Melanoma     Past Surgical History:  Procedure Laterality Date  . Breast Biopsy     dense breast tissue  . BREAST EXCISIONAL BIOPSY Left 2000   benign  . CHOLECYSTECTOMY    . EUS  04/04/2012   Procedure: UPPER ENDOSCOPIC ULTRASOUND (EUS) LINEAR;  Surgeon: Milus Banister, MD;  Location: [facility];  Service: Endoscopy;  Laterality: N/A;  radial linear  . KNEE ARTHROPLASTY     total right  02-28-2010 Dr Pilar Plate Aluisio, left  . KNEE ARTHROPLASTY Bilateral 09/12/2017   Hector Shade  . LEFT HEART CATH AND CORONARY ANGIOGRAPHY N/A 04/20/2017   Procedure: LEFT HEART CATH AND CORONARY ANGIOGRAPHY;  Surgeon: Burnell Blanks, MD;  Location: [facility];  Service: Cardiovascular;  Laterality: N/A;  . TUBAL LIGATION      There were no vitals filed for this visit.  Subjective Assessment - 11/01/17 1009    Subjective  doing more at home and some yoga and maybe with machine knees are moving the wrong direction.  shld still with knots    Currently in Pain?  Yes    Pain Score  4     Pain Orientation  Right                       [facility] Adult PT Treatment/Exercise - 11/01/17 0001      Knee/Hip Exercises: Aerobic   Nustep  L 5 6 min      Knee/Hip Exercises: Standing   Other Standing Knee Exercises  3# HS curl 2 sets 10    Other Standing Knee Exercises  3# SLR with ER 2 sets 10      Knee/Hip Exercises: Seated   Long Arc Quad  Strengthening;Both;2 sets;10 reps 3# ball squeeze    Ball Squeeze  20 times 3 sec    Clamshell with TheraBand  Blue 20 times    Knee/Hip Flexion  hip flex blue tband 15      Moist Heat Therapy   Number Minutes Moist Heat  15 Minutes    Moist Heat Location  Shoulder;Cervical       Trigger Point Dry Needling - 11/01/17 1109    Consent Given?  Yes    Muscles  Treated Upper Body  Upper trapezius;Suboccipitals muscle group;Levator scapulae    Upper Trapezius Response  Twitch reponse elicited;Palpable increased muscle length    SubOccipitals Response  Twitch response elicited    Levator Scapulae Response  Twitch response elicited;Palpable increased muscle length             PT Short Term Goals - 11/01/17 1034      PT SHORT TERM GOAL #2   Title  independent with knee HEP    Status  Achieved        PT Long Term Goals - 11/01/17 1034      PT LONG TERM GOAL #1   Title  decrease pain 50% for ADLs    Baseline  some increased pain this week with increased activty    Status  Partially Met      PT LONG TERM GOAL #2   Title  increase overall shoulder strength 5/5 for functional use     Status  Partially Met      PT LONG TERM GOAL #3   Title  reports attending normal exercise classes regularly without pain or limitation for QOL    Baseline  tried yoga at home, some pain    Status  Partially Met      PT LONG TERM GOAL #4   Title  reports being able to do hair without pain or limitation    Status  Partially Met      PT LONG TERM GOAL #5    Status  Partially Met      PT LONG TERM GOAL #6   Title  increase knee ROM to full extension    Status  Achieved            Plan - 11/01/17 1037    Clinical Impression Statement  slowly progressing pt back to wt machines as she desires to return to gym and some yoga at home but pt reports knees bothering her more- backed down ex today and focus on VMO activation which decreased knee pain. pt continues with TP in trap and cerv that is responding well to DN.    PT Treatment/Interventions  Cryotherapy;Electrical Stimulation;Iontophoresis '4mg'$ /ml Dexamethasone;Moist Heat;Therapeutic activities;Therapeutic exercise;Patient/family education;Manual techniques;Vasopneumatic Device    PT Next Visit Plan  Reassessment done. Focus on VMO, DN and postural strength for shld       Patient will benefit from skilled therapeutic intervention in order to improve the following deficits and impairments:  Pain, Decreased range of motion, Decreased strength, Impaired UE functional use, Increased edema, Impaired tone, Abnormal gait, Decreased balance, Decreased mobility, Impaired flexibility  Visit Diagnosis: Acute pain of right knee - Plan: PT plan of care cert/re-cert  Acute pain of right shoulder - Plan: PT plan of care cert/re-cert  Difficulty in walking, not elsewhere classified - Plan: PT plan of care cert/re-cert  Acute pain of left knee - Plan: PT plan of care cert/re-cert  Muscle weakness (generalized) - Plan: PT plan of care cert/re-cert     Problem List Patient Active Problem List   Diagnosis Date Noted  . Coronary artery disease   . Dysuria 02/17/2017  . Diarrhea 02/17/2017  . Vitamin D deficiency 11/07/2016  . Acute on chronic diastolic CHF (congestive heart failure), NYHA class 1 ([place]) 11/18/2015  . Diabetes mellitus without complication ([place]) 66/12/3014  . Reactive airway disease with acute exacerbation 09/10/2014  . Nonspecific (abnormal) findings on radiological and other  examination of gastrointestinal tract 04/04/2012  . Insomnia, idiopathic 10/04/2010  . Hyperlipidemia  09/22/2009  . Essential hypertension 09/22/2009  . CAD, NATIVE VESSEL 09/22/2009  . EDEMA 05/19/2009  . Hypothyroidism 02/23/2009  . EXTRINSIC ASTHMA, UNSPECIFIED 02/10/2009  . KERATOSIS, SEBORRHEIC [place] 04/16/2007  . SKIN CANCER, HX OF 02/14/2007  . Depression 01/24/2007  . MYOCARDIAL INFARCTION, HX OF 01/24/2007  . GERD 01/24/2007  . Osteoarthritis 01/24/2007  . COLONIC POLYPS, HX OF 01/24/2007  . IRRITABLE BOWEL SYNDROME, HX OF 01/24/2007    Sumner Boast., PT 11/01/2017, 11:14 AM  [facility] [address] [place], [place], [address] Phone: [phone]   Fax:  [phone]  Name: Rebecca [name] MRN: [id number] Date of Birth: 1946-04-18

## 2017-11-02 ENCOUNTER — Other Ambulatory Visit: Payer: Self-pay | Admitting: Adult Health

## 2017-11-02 DIAGNOSIS — Z1231 Encounter for screening mammogram for malignant neoplasm of breast: Secondary | ICD-10-CM

## 2017-11-06 ENCOUNTER — Encounter: Payer: Self-pay | Admitting: Physical Therapy

## 2017-11-06 ENCOUNTER — Ambulatory Visit: Payer: Medicare Other | Admitting: Physical Therapy

## 2017-11-06 DIAGNOSIS — M25562 Pain in left knee: Secondary | ICD-10-CM

## 2017-11-06 DIAGNOSIS — R262 Difficulty in walking, not elsewhere classified: Secondary | ICD-10-CM | POA: Diagnosis not present

## 2017-11-06 DIAGNOSIS — M25511 Pain in right shoulder: Secondary | ICD-10-CM

## 2017-11-06 DIAGNOSIS — M6281 Muscle weakness (generalized): Secondary | ICD-10-CM

## 2017-11-06 DIAGNOSIS — M25561 Pain in right knee: Secondary | ICD-10-CM | POA: Diagnosis not present

## 2017-11-06 DIAGNOSIS — M6289 Other specified disorders of muscle: Secondary | ICD-10-CM | POA: Diagnosis not present

## 2017-11-06 NOTE — Therapy (Signed)
[facility] [address] [place], [place], [address] Phone: [phone]   Fax:  [phone]  Physical Therapy Treatment  Patient Details  Name: Rebecca [name] MRN: [id number] Date of Birth: 09/25/1945 Referring Provider: Molli Barrows   Encounter Date: 11/06/2017  PT End of Session - 11/06/17 1042    Visit Number  17    Date for PT Re-Evaluation  12/01/17    PT Start Time  1010    PT Stop Time  1110    PT Time Calculation (min)  60 min       Past Medical History:  Diagnosis Date  . Asthma    extrinsic  . CAD (coronary artery disease)   . Chest pain, atypical   . Diabetes ([place])   . Edema   . Fatigue   . Fatigue   . GERD (gastroesophageal reflux disease)   . Hyperlipidemia   . Hypertension    familiar  . Hypothyroidism   . IBS (irritable bowel syndrome)    hx  . Keratosis, seborrheic   . Labyrinthitis   . MI (myocardial infarction) ([place])    age 69  . Osteoarthritis   . Skin cancer    Melanoma     Past Surgical History:  Procedure Laterality Date  . Breast Biopsy     dense breast tissue  . BREAST EXCISIONAL BIOPSY Left 2000   benign  . CHOLECYSTECTOMY    . EUS  04/04/2012   Procedure: UPPER ENDOSCOPIC ULTRASOUND (EUS) LINEAR;  Surgeon: Milus Banister, MD;  Location: [facility];  Service: Endoscopy;  Laterality: N/A;  radial linear  . KNEE ARTHROPLASTY     total right  02-28-2010 Dr Pilar Plate Aluisio, left  . KNEE ARTHROPLASTY Bilateral 09/12/2017   Hector Shade  . LEFT HEART CATH AND CORONARY ANGIOGRAPHY N/A 04/20/2017   Procedure: LEFT HEART CATH AND CORONARY ANGIOGRAPHY;  Surgeon: Burnell Blanks, MD;  Location: [facility];  Service: Cardiovascular;  Laterality: N/A;  . TUBAL LIGATION      There were no vitals filed for this visit.  Subjective Assessment - 11/06/17 1009    Subjective  walking more-knees wobbly but less pain. shld still some hot spots    Currently in Pain?   Yes    Pain Score  3     Pain Orientation  Right                       [facility] Adult PT Treatment/Exercise - 11/06/17 0001      Knee/Hip Exercises: Aerobic   Nustep  L 5 6 min      Knee/Hip Exercises: Machines for Strengthening   Cybex Leg Press  20# with ball squeeze 3 sets 10      Knee/Hip Exercises: Standing   Lateral Step Up  Both;10 reps;Hand Hold: 2;Step Height: 6" opp leg abd    Forward Step Up  Both;Hand Hold: 2;10 reps;Step Height: 6";Other (comment) opp leg ext    Wall Squat  2 sets;5 reps;3 seconds ball squeeze    Other Standing Knee Exercises  6 inch add step up 10 each      Shoulder Exercises: Seated   Other Seated Exercises  row 20# 2 sets 15, lat pull 15# 2 sets 15      Shoulder Exercises: Standing   External Rotation  Strengthening;Both;15 reps;Weights    External Rotation Weight (lbs)  3    Extension  Strengthening;Both;15 reps;Weights  Extension Weight (lbs)  3    Other Standing Exercises  3# PNF 10, 3# empty can 10 reps - 2 sets each    Other Standing Exercises  2# wall angels 3 sets 5      Moist Heat Therapy   Number Minutes Moist Heat  15 Minutes    Moist Heat Location  Shoulder;Cervical       Trigger Point Dry Needling - 11/06/17 1111    Consent Given?  Yes    Upper Trapezius Response  Twitch reponse elicited also did lateral deltoid    SubOccipitals Response  Palpable increased muscle length    Levator Scapulae Response  Twitch response elicited             PT Short Term Goals - 11/01/17 1034      PT SHORT TERM GOAL #2   Title  independent with knee HEP    Status  Achieved        PT Long Term Goals - 11/01/17 1034      PT LONG TERM GOAL #1   Title  decrease pain 50% for ADLs    Baseline  some increased pain this week with increased activty    Status  Partially Met      PT LONG TERM GOAL #2   Title  increase overall shoulder strength 5/5 for functional use     Status  Partially Met      PT LONG TERM GOAL #3    Title  reports attending normal exercise classes regularly without pain or limitation for QOL    Baseline  tried yoga at home, some pain    Status  Partially Met      PT LONG TERM GOAL #4   Title  reports being able to do hair without pain or limitation    Status  Partially Met      PT LONG TERM GOAL #5   Status  Partially Met      PT LONG TERM GOAL #6   Title  increase knee ROM to full extension    Status  Achieved            Plan - 11/06/17 1043    Clinical Impression Statement  focus on strength and control of especially VMO and shld strength- cuing for control mvmt and correct muscle activation. pt responding well to VMO strength and DN for shld    PT Treatment/Interventions  Cryotherapy;Electrical Stimulation;Iontophoresis 23m/ml Dexamethasone;Moist Heat;Therapeutic activities;Therapeutic exercise;Patient/family education;Manual techniques;Vasopneumatic Device    PT Next Visit Plan  Focus on VMO, DN and postural strength for shld       Patient will benefit from skilled therapeutic intervention in order to improve the following deficits and impairments:  Pain, Decreased range of motion, Decreased strength, Impaired UE functional use, Increased edema, Impaired tone, Abnormal gait, Decreased balance, Decreased mobility, Impaired flexibility  Visit Diagnosis: Acute pain of right knee  Acute pain of right shoulder  Difficulty in walking, not elsewhere classified  Acute pain of left knee  Muscle weakness (generalized)     Problem List Patient Active Problem List   Diagnosis Date Noted  . Coronary artery disease   . Dysuria 02/17/2017  . Diarrhea 02/17/2017  . Vitamin D deficiency 11/07/2016  . Acute on chronic diastolic CHF (congestive heart failure), [place] 1 [place] 11/18/2015  . Diabetes mellitus without complication ([place] 015/17/6160 . Reactive airway disease with acute exacerbation 09/10/2014  . Nonspecific (abnormal) findings on radiological and other  examination of  gastrointestinal tract 04/04/2012  . Insomnia, idiopathic 10/04/2010  . Hyperlipidemia 09/22/2009  . Essential hypertension 09/22/2009  . CAD, NATIVE VESSEL 09/22/2009  . EDEMA 05/19/2009  . Hypothyroidism 02/23/2009  . EXTRINSIC ASTHMA, UNSPECIFIED 02/10/2009  . KERATOSIS, SEBORRHEIC [place] 04/16/2007  . SKIN CANCER, HX OF 02/14/2007  . Depression 01/24/2007  . MYOCARDIAL INFARCTION, HX OF 01/24/2007  . GERD 01/24/2007  . Osteoarthritis 01/24/2007  . COLONIC POLYPS, HX OF 01/24/2007  . IRRITABLE BOWEL SYNDROME, HX OF 01/24/2007    Sumner Boast., PT 11/06/2017, 11:12 AM  [facility] [address] [place], [place], [address] Phone: [phone]   Fax:  [phone]  Name: Rebecca [name] MRN: [id number] Date of Birth: 1946/02/06

## 2017-11-08 ENCOUNTER — Ambulatory Visit: Payer: Medicare Other | Admitting: Physical Therapy

## 2017-11-09 ENCOUNTER — Encounter: Payer: Self-pay | Admitting: Adult Health

## 2017-11-09 ENCOUNTER — Ambulatory Visit (INDEPENDENT_AMBULATORY_CARE_PROVIDER_SITE_OTHER): Payer: Medicare Other | Admitting: Adult Health

## 2017-11-09 VITALS — BP 118/72 | Temp 97.8°F | Ht <= 58 in | Wt 139.0 lb

## 2017-11-09 DIAGNOSIS — I10 Essential (primary) hypertension: Secondary | ICD-10-CM | POA: Diagnosis not present

## 2017-11-09 DIAGNOSIS — E039 Hypothyroidism, unspecified: Secondary | ICD-10-CM | POA: Diagnosis not present

## 2017-11-09 DIAGNOSIS — I2510 Atherosclerotic heart disease of native coronary artery without angina pectoris: Secondary | ICD-10-CM | POA: Diagnosis not present

## 2017-11-09 DIAGNOSIS — E559 Vitamin D deficiency, unspecified: Secondary | ICD-10-CM | POA: Diagnosis not present

## 2017-11-09 DIAGNOSIS — E119 Type 2 diabetes mellitus without complications: Secondary | ICD-10-CM

## 2017-11-09 DIAGNOSIS — F5101 Primary insomnia: Secondary | ICD-10-CM | POA: Diagnosis not present

## 2017-11-09 DIAGNOSIS — E785 Hyperlipidemia, unspecified: Secondary | ICD-10-CM

## 2017-11-09 DIAGNOSIS — Z780 Asymptomatic menopausal state: Secondary | ICD-10-CM

## 2017-11-09 LAB — TSH: TSH: 1.24 u[IU]/mL (ref 0.35–4.50)

## 2017-11-09 LAB — CBC WITH DIFFERENTIAL/PLATELET
BASOS ABS: 0 10*3/uL (ref 0.0–0.1)
BASOS PCT: 0.7 % (ref 0.0–3.0)
EOS ABS: 0.2 10*3/uL (ref 0.0–0.7)
Eosinophils Relative: 4.1 % (ref 0.0–5.0)
HCT: 43.3 % (ref 36.0–46.0)
Hemoglobin: 14.8 g/dL (ref 12.0–15.0)
LYMPHS PCT: 29.2 % (ref 12.0–46.0)
Lymphs Abs: 1.6 10*3/uL (ref 0.7–4.0)
MCHC: 34.1 g/dL (ref 30.0–36.0)
MCV: 92 fl (ref 78.0–100.0)
MONO ABS: 0.5 10*3/uL (ref 0.1–1.0)
Monocytes Relative: 9.3 % (ref 3.0–12.0)
NEUTROS ABS: 3 10*3/uL (ref 1.4–7.7)
NEUTROS PCT: 56.7 % (ref 43.0–77.0)
PLATELETS: 291 10*3/uL (ref 150.0–400.0)
RBC: 4.71 Mil/uL (ref 3.87–5.11)
RDW: 13.8 % (ref 11.5–15.5)
WBC: 5.3 10*3/uL (ref 4.0–10.5)

## 2017-11-09 LAB — LIPID PANEL
CHOL/HDL RATIO: 4
CHOLESTEROL: 201 mg/dL — AB (ref 0–200)
HDL: 50.7 mg/dL (ref >39.00)
LDL CALC: 126 mg/dL — AB (ref 0–99)
NonHDL: 150.77
TRIGLYCERIDES: 125 mg/dL (ref 0.0–149.0)
VLDL: 25 mg/dL (ref 0.0–40.0)

## 2017-11-09 LAB — BASIC METABOLIC PANEL
BUN: 13 mg/dL (ref 6–23)
CALCIUM: 10.4 mg/dL (ref 8.4–10.5)
CHLORIDE: 100 meq/L (ref 96–112)
CO2: 29 meq/L (ref 19–32)
CREATININE: 0.77 mg/dL (ref 0.40–1.20)
GFR: 77.98 mL/min (ref >60.00)
Glucose, Bld: 106 mg/dL — ABNORMAL HIGH (ref 70–99)
Potassium: 3.7 mEq/L (ref 3.5–5.1)
Sodium: 139 mEq/L (ref 135–145)

## 2017-11-09 LAB — HEPATIC FUNCTION PANEL
ALT: 16 U/L (ref 0–35)
AST: 13 U/L (ref 0–37)
Albumin: 4.2 g/dL (ref 3.5–5.2)
Alkaline Phosphatase: 103 U/L (ref 39–117)
BILIRUBIN DIRECT: 0.1 mg/dL (ref 0.0–0.3)
BILIRUBIN TOTAL: 0.5 mg/dL (ref 0.2–1.2)
Total Protein: 6.4 g/dL (ref 6.0–8.3)

## 2017-11-09 LAB — MICROALBUMIN / CREATININE URINE RATIO
Creatinine,U: 17.3 mg/dL
MICROALB/CREAT RATIO: 4 mg/g (ref 0.0–30.0)
Microalb, Ur: <0.7 mg/dL (ref 0.0–1.9)

## 2017-11-09 LAB — HEMOGLOBIN A1C: HEMOGLOBIN A1C: 7 % — AB (ref 4.6–6.5)

## 2017-11-09 LAB — VITAMIN D 25 HYDROXY (VIT D DEFICIENCY, FRACTURES): VITD: 56.47 ng/mL (ref 30.00–100.00)

## 2017-11-09 NOTE — Progress Notes (Signed)
Subjective:    Patient ID: Rebecca [name], female    DOB: Nov 19, 1945, 72 y.o.   MRN: [id number]  HPI Patient presents for yearly follow up exam. She is a pleasant 72 year old female who  has a past medical history of Asthma, CAD (coronary artery disease), Chest pain, atypical, Diabetes ([place]), Edema, Fatigue, Fatigue, GERD (gastroesophageal reflux disease), Hyperlipidemia, Hypertension, Hypothyroidism, IBS (irritable bowel syndrome), Keratosis, seborrheic, Labyrinthitis, MI (myocardial infarction) ([place]), Osteoarthritis, and Skin cancer.   DM -controlled with Invokana 100 mg daily.  Reports blood per readings between 100-170.  Lab Results  Component Value Date   HGBA1C 6.4 06/27/2017    Hyperlipidemia -takes 80 mg of Lipitor daily- controlled Lab Results  Component Value Date   CHOL 171 11/07/2016   HDL 56.50 11/07/2016   LDLCALC 97 11/07/2016   TRIG 86.0 11/07/2016   CHOLHDL 3 11/07/2016    Hypertension -takes Cardizem 180 mg tab and Lasix 20 mg twice daily BP Readings from Last 3 Encounters:  11/09/17 118/72  06/27/17 128/60  04/20/17 (!) 109/54   Hypothyroidism -takes 50 mcg of Synthroid  Insomnia - Takes Trazodone 50 mg QHS   All immunizations and health maintenance protocols were reviewed with the patient and needed orders were placed.  Appropriate screening laboratory values were ordered for the patient including screening of hyperlipidemia, renal function and hepatic function.  Medication reconciliation,  past medical history, social history, problem list and allergies were reviewed in detail with the patient  Goals were established with regard to weight loss, exercise, and  diet in compliance with medications. She has been diagnosed with patellar clunk syndrome and has been doing PT for this for the last 1.5 months. Reports discomfort is slowly improving. She has not been able to work out as much as she would like.   She has an advanced directive and living  will  Health maintenance items such as colonoscopy, mammogram, dental and vision screens are up-to-date.  She is due for DEXA scan  Review of Systems  Constitutional: Negative.   HENT: Negative.   Eyes: Negative.   Respiratory: Negative.   Cardiovascular: Negative.   Gastrointestinal: Negative.   Endocrine: Negative.   Genitourinary: Negative.   Musculoskeletal: Positive for arthralgias (right knee pain and right shoulder pain ).  Skin: Negative.   Allergic/Immunologic: Negative.   Neurological: Negative.   Hematological: Negative.   Psychiatric/Behavioral: Negative.    Past Medical History:  Diagnosis Date  . Asthma    extrinsic  . CAD (coronary artery disease)   . Chest pain, atypical   . Diabetes ([place])   . Edema   . Fatigue   . Fatigue   . GERD (gastroesophageal reflux disease)   . Hyperlipidemia   . Hypertension    familiar  . Hypothyroidism   . IBS (irritable bowel syndrome)    hx  . Keratosis, seborrheic   . Labyrinthitis   . MI (myocardial infarction) ([place])    age 74  . Osteoarthritis   . Skin cancer    Melanoma     Social History   Socioeconomic History  . Marital status: Married    Spouse name: Not on file  . Number of children: 2  . Years of education: Not on file  . Highest education level: Not on file  Occupational History  . Occupation: RN-retired    Employer: RETIRED  Social Needs  . Financial resource strain: Not on file  . Food insecurity:    Worry: Not on  file    Inability: Not on file  . Transportation needs:    Medical: Not on file    Non-medical: Not on file  Tobacco Use  . Smoking status: Never Smoker  . Smokeless tobacco: Never Used  Substance and Sexual Activity  . Alcohol use: No    Comment: rarely  . Drug use: No  . Sexual activity: Not on file  Lifestyle  . Physical activity:    Days per week: Not on file    Minutes per session: Not on file  . Stress: Not on file  Relationships  . Social connections:    Talks on  phone: Not on file    Gets together: Not on file    Attends religious service: Not on file    Active member of club or organization: Not on file    Attends meetings of clubs or organizations: Not on file    Relationship status: Not on file  . Intimate partner violence:    Fear of current or ex partner: Not on file    Emotionally abused: Not on file    Physically abused: Not on file    Forced sexual activity: Not on file  Other Topics Concern  . Not on file  Social History Narrative   ** Merged History Encounter **        Past Surgical History:  Procedure Laterality Date  . Breast Biopsy     dense breast tissue  . BREAST EXCISIONAL BIOPSY Left 2000   benign  . CHOLECYSTECTOMY    . EUS  04/04/2012   Procedure: UPPER ENDOSCOPIC ULTRASOUND (EUS) LINEAR;  Surgeon: Milus Banister, MD;  Location[facility];  Service: Endoscopy;  Laterality: N/A;  radial linear  . KNEE ARTHROPLASTY     total right  02-28-2010 Dr Pilar Plate Aluisio, left  . KNEE ARTHROPLASTY Bilateral 09/12/2017   Hector Shade  . LEFT HEART CATH AND CORONARY ANGIOGRAPHY N/A 04/20/2017   Procedure: LEFT HEART CATH AND CORONARY ANGIOGRAPHY;  Surgeon: Burnell Blanks, MD;  Location: [facility];  Service: Cardiovascular;  Laterality: N/A;  . TUBAL LIGATION      Family History  Problem Relation Age of Onset  . Heart disease Mother   . Hypertension Mother   . Heart attack Mother 74       MULTIPLES OVER TIME  . Melanoma Daughter 51       METASTATIC  . Breast cancer Sister   . Hypertension Father   . Hypertension Sister     Allergies  Allergen Reactions  . Sulfa Antibiotics   . Sulfonamide Derivatives     REACTION: Kathreen Cosier syndrome    Current Outpatient Medications on File Prior to Visit  Medication Sig Dispense Refill  . ACCU-CHEK FASTCLIX LANCETS MISC CHECK GLUCOSE 3 TIMES DAILY 100 each 3  . ACCU-CHEK FASTCLIX LANCETS MISC USE   TO CHECK GLUCOSE TWICE DAILY 102 each 5  . acetaminophen  (TYLENOL) 325 MG tablet Take 650 mg by mouth every 6 (six) hours as needed.      Marland Kitchen aspirin EC 81 MG tablet Take 1 tablet (81 mg total) by mouth daily. 90 tablet 3  . atorvastatin (LIPITOR) 80 MG tablet Take 1 tablet (80 mg total) by mouth daily. 90 tablet 3  . calcium carbonate (OS-CAL) 600 MG TABS Take 1,200 mg by mouth daily.     . Cholecalciferol (VITAMIN D) 2000 UNITS CAPS Take 1 capsule by mouth daily.      Marland Kitchen  conjugated estrogens (PREMARIN) vaginal cream Place 0.5 Applicatorfuls vaginally every 7 (seven) days. 42.5 g 6  . diltiazem (CARDIZEM CD) 180 MG 24 hr capsule TAKE 1 CAPSULE BY MOUTH  DAILY 90 capsule 1  . diphenoxylate-atropine (LOMOTIL) 2.5-0.025 MG per tablet Take 1 tablet by mouth 4 (four) times daily as needed for diarrhea or loose stools. 30 tablet 0  . fluticasone (FLONASE) 50 MCG/ACT nasal spray Place 1 spray into both nostrils 2 (two) times daily as needed for allergies or rhinitis (Seasonal allergies).    . furosemide (LASIX) 20 MG tablet Take 1 tablet (20 mg total) by mouth 2 (two) times daily. 180 tablet 3  . glucose blood (ACCU-CHEK SMARTVIEW) test strip CHECK GLUCOSE THREE TIMES DAILY 100 each 3  . ibuprofen (ADVIL,MOTRIN) 200 MG tablet Take 600 mg by mouth every 6 (six) hours as needed for pain.    . INVOKANA 100 MG TABS tablet TAKE 1 TABLET BY MOUTH  DAILY BEFORE BREAKFAST 90 tablet 1  . levothyroxine (SYNTHROID, LEVOTHROID) 50 MCG tablet TAKE 1 TABLET BY MOUTH  DAILY BEFORE BREAKFAST 90 tablet 2  . Naphazoline-Polyethyl Glycol (EYE DROPS) 0.012-0.2 % SOLN Apply to eye as needed. Artifical eye drops    . nitroGLYCERIN (NITROSTAT) 0.4 MG SL tablet DISSOLVE 1 TABLET UNDER THE TONGUE EVERY 5 MINUTES AS  NEEDED FOR CHEST PAIN. MAX  3 DOSES IN 15 MINUTES. CALL 911 IF CHEST PAIN PERSISTS 100 tablet 6  . Omega-3 Fatty Acids (FISH OIL) 1200 MG CAPS Take by mouth.      . potassium chloride SA (K-DUR,KLOR-CON) 20 MEQ tablet Take 1 tablet (20 mEq total) by mouth 2 (two) times daily.  180 tablet 3  . tiZANidine (ZANAFLEX) 4 MG tablet Take 1 tablet (4 mg total) by mouth every 6 (six) hours as needed for muscle spasms. 30 tablet 3  . traZODone (DESYREL) 50 MG tablet Take 0.5-1 tablets (25-50 mg total) by mouth at bedtime as needed for sleep. 90 tablet 2  . diclofenac sodium (VOLTAREN) 1 % GEL diclofenac 1 % topical gel  APPLY 2 GRAM TO THE AFFECTED AREA(S) BY TOPICAL ROUTE 4 TIMES PER DAY     No current facility-administered medications on file prior to visit.     BP 118/72   Temp 97.8 F (36.6 C) (Oral)   Ht 4\' 10"  (1.473 m) Comment: WITHOUT SHOES  Wt 139 lb (63 kg)   BMI 29.05 kg/m       Objective:   Physical Exam  Constitutional: She is oriented to person, place, and time. She appears well-developed and well-nourished. No distress.  HENT:  Head: Normocephalic and atraumatic.  Right Ear: External ear normal.  Left Ear: External ear normal.  Nose: Nose normal.  Mouth/Throat: Oropharynx is clear and moist. No oropharyngeal exudate.  Eyes: Pupils are equal, round, and reactive to light. Conjunctivae and EOM are normal. Right eye exhibits no discharge. Left eye exhibits no discharge. No scleral icterus.  Neck: Normal range of motion. Neck supple. No JVD present. No tracheal deviation present. No thyromegaly present.  Cardiovascular: Normal rate, regular rhythm, normal heart sounds and intact distal pulses. Exam reveals no gallop and no friction rub.  No murmur heard. Pulmonary/Chest: Effort normal and breath sounds normal. No stridor. No respiratory distress. She has no wheezes. She has no rales. She exhibits no tenderness.  Abdominal: Soft. Bowel sounds are normal. She exhibits no distension and no mass. There is no tenderness. There is no rebound and no guarding.  Genitourinary:  Genitourinary Comments: Deferred    Musculoskeletal: Normal range of motion. She exhibits tenderness. She exhibits no edema or deformity.  Lymphadenopathy:    She has no cervical  adenopathy.  Neurological: She is alert and oriented to person, place, and time. She has normal reflexes. She displays normal reflexes. No cranial nerve deficit. She exhibits normal muscle tone. Coordination normal.  Skin: Skin is warm and dry. No rash noted. She is not diaphoretic. No erythema. No pallor.  Psychiatric: She has a normal mood and affect. Her behavior is normal. Judgment and thought content normal.  Nursing note and vitals reviewed.     Assessment & Plan:  1. Diabetes mellitus without complication ([place]) - BS have been elevated more over the last few months. Consider different agent but will wait to make that determination once A1c has returned  - Basic metabolic panel - CBC with Differential/Platelet - Hemoglobin A1c - Hepatic function panel - Lipid panel - TSH - Microalbumin / creatinine urine ratio  2. Essential hypertension - Well controlled, no change in medications - Basic metabolic panel - CBC with Differential/Platelet - Hemoglobin A1c - Hepatic function panel - Lipid panel - TSH - Microalbumin / creatinine urine ratio  3. Hypothyroidism, unspecified type - Consider increase in synthroid  - Basic metabolic panel - CBC with Differential/Platelet - Hemoglobin A1c - Hepatic function panel - Lipid panel - TSH  4. Hyperlipidemia, unspecified hyperlipidemia type - Consider increase in statin  - Basic metabolic panel - CBC with Differential/Platelet - Hemoglobin A1c - Hepatic function panel - Lipid panel - TSH  5. Vitamin D deficiency  - Vitamin D, 25-hydroxy - DG Bone Density; Future  6. Insomnia, idiopathic - Continue Trazodone   7. Post-menopausal  - DG Bone Density; Future   [name], AGNP

## 2017-11-12 ENCOUNTER — Other Ambulatory Visit: Payer: Self-pay | Admitting: Adult Health

## 2017-11-12 DIAGNOSIS — E559 Vitamin D deficiency, unspecified: Secondary | ICD-10-CM

## 2017-11-12 DIAGNOSIS — E2839 Other primary ovarian failure: Secondary | ICD-10-CM

## 2017-11-13 ENCOUNTER — Ambulatory Visit: Payer: Medicare Other | Admitting: Physical Therapy

## 2017-11-13 DIAGNOSIS — M6289 Other specified disorders of muscle: Secondary | ICD-10-CM | POA: Diagnosis not present

## 2017-11-13 DIAGNOSIS — M25561 Pain in right knee: Secondary | ICD-10-CM | POA: Diagnosis not present

## 2017-11-13 DIAGNOSIS — M6281 Muscle weakness (generalized): Secondary | ICD-10-CM

## 2017-11-13 DIAGNOSIS — M25562 Pain in left knee: Secondary | ICD-10-CM | POA: Diagnosis not present

## 2017-11-13 DIAGNOSIS — M25511 Pain in right shoulder: Secondary | ICD-10-CM

## 2017-11-13 DIAGNOSIS — R262 Difficulty in walking, not elsewhere classified: Secondary | ICD-10-CM | POA: Diagnosis not present

## 2017-11-13 NOTE — Therapy (Signed)
[address] [place], [place], [address] Phone: [phone]   Fax:  [phone]  Physical Therapy Treatment  Patient Details  Name: Rebecca [name] MRN: [id number] Date of Birth: 1945-08-23 Referring Provider: Molli Barrows   Encounter Date: 11/13/2017  PT End of Session - 11/13/17 1058    Visit Number  18    Date for PT Re-Evaluation  12/01/17    PT Start Time  1010    PT Stop Time  1050    PT Time Calculation (min)  40 min       Past Medical History:  Diagnosis Date  . Asthma    extrinsic  . CAD (coronary artery disease)   . Chest pain, atypical   . Diabetes ([place])   . Edema   . Fatigue   . Fatigue   . GERD (gastroesophageal reflux disease)   . Hyperlipidemia   . Hypertension    familiar  . Hypothyroidism   . IBS (irritable bowel syndrome)    hx  . Keratosis, seborrheic   . Labyrinthitis   . MI (myocardial infarction) ([place])    age 72  . Osteoarthritis   . Skin cancer    Melanoma     Past Surgical History:  Procedure Laterality Date  . Breast Biopsy     dense breast tissue  . BREAST EXCISIONAL BIOPSY Left 2000   benign  . CHOLECYSTECTOMY    . EUS  04/04/2012   Procedure: UPPER ENDOSCOPIC ULTRASOUND (EUS) LINEAR;  Surgeon: Milus Banister, MD;  [facility];  Service: Endoscopy;  Laterality: N/A;  radial linear  . KNEE ARTHROPLASTY     total right  02-28-2010 Dr Pilar Plate Aluisio, left  . KNEE ARTHROPLASTY Bilateral 09/12/2017   Hector Shade  . LEFT HEART CATH AND CORONARY ANGIOGRAPHY N/A 04/20/2017   Procedure: LEFT HEART CATH AND CORONARY ANGIOGRAPHY;  Surgeon: Burnell Blanks, MD;  Location: [facility];  Service: Cardiovascular;  Laterality: N/A;  . TUBAL LIGATION      There were no vitals filed for this visit.  Subjective Assessment - 11/13/17 1011    Subjective  knees are good, shld deep ache- seeing Collins fo rshld this afternoon. Already warmed up at  home    Currently in Pain?  Yes    Pain Score  4     Pain Location  Shoulder    Pain Orientation  Right    Pain Descriptors / Indicators  Stabbing                       [facility] Adult PT Treatment/Exercise - 11/13/17 0001      Knee/Hip Exercises: Machines for Strengthening   Cybex Knee Extension  5# with ball squeeze 8 stopped d/t catch and pain    Cybex Knee Flexion  20# 3 sets10    Cybex Leg Press  20# with ball squeeze 3 sets 10 calf raises 2 sets 15      Knee/Hip Exercises: Standing   Lateral Step Up  Both;15 reps;Hand Hold: 2;Step Height: 6"    Forward Step Up  Both;15 reps;Step Height: 6"    Other Standing Knee Exercises  TKE with ball 15 each      Shoulder Exercises: Standing   External Rotation  Strengthening;Both;20 reps;Theraband    Theraband Level (Shoulder External Rotation)  Level 2 (Red)    Flexion  Strengthening;Both;15 reps;Weights    Shoulder Flexion Weight (lbs)  3    ABduction  Strengthening;Both;12 reps;Weights    Shoulder ABduction Weight (lbs)  3    Extension  Strengthening;Both;15 reps;Weights    Extension Weight (lbs)  3    Other Standing Exercises  red tband PNF 2 sets 10    Other Standing Exercises  CW and CCW ball on wall 5 each      Shoulder Exercises: ROM/Strengthening   Other ROM/Strengthening Exercises  seated row 25# 3 sets 10               PT Short Term Goals - 11/01/17 1034      PT SHORT TERM GOAL #2   Title  independent with knee HEP    Status  Achieved        PT Long Term Goals - 11/01/17 1034      PT LONG TERM GOAL #1   Title  decrease pain 50% for ADLs    Baseline  some increased pain this week with increased activty    Status  Partially Met      PT LONG TERM GOAL #2   Title  increase overall shoulder strength 5/5 for functional use     Status  Partially Met      PT LONG TERM GOAL #3   Title  reports attending normal exercise classes regularly without pain or limitation for QOL    Baseline  tried  yoga at home, some pain    Status  Partially Met      PT LONG TERM GOAL #4   Title  reports being able to do hair without pain or limitation    Status  Partially Met      PT LONG TERM GOAL #5   Status  Partially Met      PT LONG TERM GOAL #6   Title  increase knee ROM to full extension    Status  Achieved            Plan - 11/13/17 1058    Clinical Impression Statement  pt seeing MD for shld today and probable MRI and knee MD thursday . Knees much better but still popping and decreased VMO ( discussed barce for yoga and ex class to stab and prevent compensation). Pt has gotten very good at self awareness and mod posture with ex     PT Treatment/Interventions  Cryotherapy;Electrical Stimulation;Iontophoresis '4mg'$ /ml Dexamethasone;Moist Heat;Therapeutic activities;Therapeutic exercise;Patient/family education;Manual techniques;Vasopneumatic Device    PT Next Visit Plan  assses after MD appts and progress from there       Patient will benefit from skilled therapeutic intervention in order to improve the following deficits and impairments:  Pain, Decreased range of motion, Decreased strength, Impaired UE functional use, Increased edema, Impaired tone, Abnormal gait, Decreased balance, Decreased mobility, Impaired flexibility  Visit Diagnosis: Acute pain of right knee  Acute pain of right shoulder  Difficulty in walking, not elsewhere classified  Acute pain of left knee  Muscle weakness (generalized)     Problem List Patient Active Problem List   Diagnosis Date Noted  . Coronary artery disease   . Dysuria 02/17/2017  . Diarrhea 02/17/2017  . Vitamin D deficiency 11/07/2016  . Acute on chronic diastolic CHF (congestive heart failure), NYHA class 1 ([place]) 11/18/2015  . Diabetes mellitus without complication ([place]) 82/50/5397  . Reactive airway disease with acute exacerbation 09/10/2014  . Nonspecific (abnormal) findings on radiological and other examination of  gastrointestinal tract 04/04/2012  . Insomnia, idiopathic 10/04/2010  . Hyperlipidemia 09/22/2009  .  Essential hypertension 09/22/2009  . CAD, NATIVE VESSEL 09/22/2009  . EDEMA 05/19/2009  . Hypothyroidism 02/23/2009  . EXTRINSIC ASTHMA, UNSPECIFIED 02/10/2009  . KERATOSIS, SEBORRHEIC [place] 04/16/2007  . SKIN CANCER, HX OF 02/14/2007  . Depression 01/24/2007  . MYOCARDIAL INFARCTION, HX OF 01/24/2007  . GERD 01/24/2007  . Osteoarthritis 01/24/2007  . COLONIC POLYPS, HX OF 01/24/2007  . IRRITABLE BOWEL SYNDROME, HX OF 01/24/2007    Janiaya Ryser,ANGIE PTA 11/13/2017, 11:01 AM  [address] [place], [place], [address] Phone: [phone]   Fax:  [phone]  Name: Rebecca [name] MRN: [id number] Date of Birth: 26-Jan-1946

## 2017-11-15 ENCOUNTER — Ambulatory Visit: Payer: Medicare Other

## 2017-11-15 DIAGNOSIS — M170 Bilateral primary osteoarthritis of knee: Secondary | ICD-10-CM | POA: Diagnosis not present

## 2017-11-16 ENCOUNTER — Other Ambulatory Visit: Payer: Self-pay | Admitting: Adult Health

## 2017-11-16 DIAGNOSIS — I2510 Atherosclerotic heart disease of native coronary artery without angina pectoris: Secondary | ICD-10-CM

## 2017-11-16 DIAGNOSIS — E119 Type 2 diabetes mellitus without complications: Secondary | ICD-10-CM

## 2017-11-16 DIAGNOSIS — G4700 Insomnia, unspecified: Secondary | ICD-10-CM

## 2017-11-16 DIAGNOSIS — Z760 Encounter for issue of repeat prescription: Secondary | ICD-10-CM

## 2017-11-16 MED ORDER — GLUCOSE BLOOD VI STRP: ORAL_STRIP | 3 refills | Status: DC

## 2017-11-16 MED ORDER — CANAGLIFLOZIN 100 MG PO TABS: ORAL_TABLET | ORAL | 1 refills | Status: DC

## 2017-11-16 MED ORDER — FUROSEMIDE 20 MG PO TABS: 20.0000 mg | ORAL_TABLET | Freq: Two times a day (BID) | ORAL | 3 refills | Status: DC

## 2017-11-16 MED ORDER — ATORVASTATIN CALCIUM 80 MG PO TABS: 80.0000 mg | ORAL_TABLET | Freq: Every day | ORAL | 3 refills | Status: DC

## 2017-11-16 MED ORDER — TRAZODONE HCL 50 MG PO TABS: 25.0000 mg | ORAL_TABLET | Freq: Every evening | ORAL | 1 refills | Status: DC | PRN

## 2017-11-16 MED ORDER — ESTROGENS, CONJUGATED 0.625 MG/GM VA CREA: 1.0000 g | TOPICAL_CREAM | VAGINAL | 6 refills | Status: DC

## 2017-11-16 MED ORDER — LEVOTHYROXINE SODIUM 50 MCG PO TABS: ORAL_TABLET | ORAL | 3 refills | Status: DC

## 2017-11-16 MED ORDER — DILTIAZEM HCL ER COATED BEADS 180 MG PO CP24: 180.0000 mg | ORAL_CAPSULE | Freq: Every day | ORAL | 3 refills | Status: DC

## 2017-11-16 MED ORDER — POTASSIUM CHLORIDE CRYS ER 20 MEQ PO TBCR: 20.0000 meq | EXTENDED_RELEASE_TABLET | Freq: Two times a day (BID) | ORAL | 3 refills | Status: DC

## 2017-11-19 DIAGNOSIS — M25511 Pain in right shoulder: Secondary | ICD-10-CM | POA: Diagnosis not present

## 2017-11-20 ENCOUNTER — Ambulatory Visit: Payer: Medicare Other | Attending: Orthopedic Surgery | Admitting: Physical Therapy

## 2017-11-20 DIAGNOSIS — M25511 Pain in right shoulder: Secondary | ICD-10-CM

## 2017-11-20 DIAGNOSIS — R262 Difficulty in walking, not elsewhere classified: Secondary | ICD-10-CM | POA: Diagnosis not present

## 2017-11-20 DIAGNOSIS — M25562 Pain in left knee: Secondary | ICD-10-CM | POA: Insufficient documentation

## 2017-11-20 DIAGNOSIS — M25561 Pain in right knee: Secondary | ICD-10-CM | POA: Diagnosis not present

## 2017-11-20 DIAGNOSIS — M6281 Muscle weakness (generalized): Secondary | ICD-10-CM | POA: Diagnosis not present

## 2017-11-20 NOTE — Therapy (Signed)
[address] [place], [place], [address] Phone: [phone]   Fax:  [phone]  Physical Therapy Treatment  Patient Details  Name: Rebecca [name] MRN: [id number] Date of Birth: Jul 13, 1946 Referring Provider: Molli Barrows   Encounter Date: 11/20/2017  PT End of Session - 11/20/17 1050    Visit Number  19    Date for PT Re-Evaluation  12/01/17    PT Start Time  6237    PT Stop Time  1050    PT Time Calculation (min)  35 min       Past Medical History:  Diagnosis Date  . Asthma    extrinsic  . CAD (coronary artery disease)   . Chest pain, atypical   . Diabetes ([place])   . Edema   . Fatigue   . Fatigue   . GERD (gastroesophageal reflux disease)   . Hyperlipidemia   . Hypertension    familiar  . Hypothyroidism   . IBS (irritable bowel syndrome)    hx  . Keratosis, seborrheic   . Labyrinthitis   . MI (myocardial infarction) ([place])    age 37  . Osteoarthritis   . Skin cancer    Melanoma     Past Surgical History:  Procedure Laterality Date  . Breast Biopsy     dense breast tissue  . BREAST EXCISIONAL BIOPSY Left 2000   benign  . CHOLECYSTECTOMY    . EUS  04/04/2012   Procedure: UPPER ENDOSCOPIC ULTRASOUND (EUS) LINEAR;  Surgeon: Milus Banister, MD;  [facility];  Service: Endoscopy;  Laterality: N/A;  radial linear  . KNEE ARTHROPLASTY     total right  02-28-2010 Dr Pilar Plate Aluisio, left  . KNEE ARTHROPLASTY Bilateral 09/12/2017   Hector Shade  . LEFT HEART CATH AND CORONARY ANGIOGRAPHY N/A 04/20/2017   Procedure: LEFT HEART CATH AND CORONARY ANGIOGRAPHY;  Surgeon: Burnell Blanks, MD;  Location: [facility];  Service: Cardiovascular;  Laterality: N/A;  . TUBAL LIGATION      There were no vitals filed for this visit.  Subjective Assessment - 11/20/17 1013    Subjective  knee MD pleased and had MRI last night and MD feel sthere is a tear    Currently in Pain?   Yes    Pain Score  3                        [facility] Adult PT Treatment/Exercise - 11/20/17 0001      Knee/Hip Exercises: Machines for Strengthening   Cybex Knee Extension  5# 2 sets 8 with VMO ball squeeze some pain and popping    Cybex Knee Flexion  20# 3 sets10    Cybex Leg Press  30# with ball squeeze 3 sets 10      Knee/Hip Exercises: Standing   Walking with Sports Cord  40# fwd/back and SW    Other Standing Knee Exercises  TKE with green tband 2 sets 10      Knee/Hip Exercises: Seated   Sit to Sand  without UE support;15 reps ball squeeze      Shoulder Exercises: Standing   Other Standing Exercises  red tband - reviewed scap stab for HEP      Shoulder Exercises: ROM/Strengthening   Other ROM/Strengthening Exercises  seated row 25#  2 sets 15               PT Short  Term Goals - 11/01/17 1034      PT SHORT TERM GOAL #2   Title  independent with knee HEP    Status  Achieved        PT Long Term Goals - 11/01/17 1034      PT LONG TERM GOAL #1   Title  decrease pain 50% for ADLs    Baseline  some increased pain this week with increased activty    Status  Partially Met      PT LONG TERM GOAL #2   Title  increase overall shoulder strength 5/5 for functional use     Status  Partially Met      PT LONG TERM GOAL #3   Title  reports attending normal exercise classes regularly without pain or limitation for QOL    Baseline  tried yoga at home, some pain    Status  Partially Met      PT LONG TERM GOAL #4   Title  reports being able to do hair without pain or limitation    Status  Partially Met      PT LONG TERM GOAL #5   Status  Partially Met      PT LONG TERM GOAL #6   Title  increase knee ROM to full extension    Status  Achieved            Plan - 11/20/17 1051    Clinical Impression Statement  reviewed scap stab HEP for RTC. minimal pooping with VMO stab with ball.    PT Treatment/Interventions  Cryotherapy;Electrical  Stimulation;Iontophoresis '4mg'$ /ml Dexamethasone;Moist Heat;Therapeutic activities;Therapeutic exercise;Patient/family education;Manual techniques;Vasopneumatic Device    PT Next Visit Plan  pending MRI       Patient will benefit from skilled therapeutic intervention in order to improve the following deficits and impairments:  Pain, Decreased range of motion, Decreased strength, Impaired UE functional use, Increased edema, Impaired tone, Abnormal gait, Decreased balance, Decreased mobility, Impaired flexibility  Visit Diagnosis: Acute pain of right knee  Acute pain of right shoulder  Difficulty in walking, not elsewhere classified  Acute pain of left knee  Muscle weakness (generalized)     Problem List Patient Active Problem List   Diagnosis Date Noted  . Coronary artery disease   . Dysuria 02/17/2017  . Diarrhea 02/17/2017  . Vitamin D deficiency 11/07/2016  . Acute on chronic diastolic CHF (congestive heart failure), NYHA class 1 ([place]) 11/18/2015  . Diabetes mellitus without complication ([place]) 03/47/4259  . Reactive airway disease with acute exacerbation 09/10/2014  . Nonspecific (abnormal) findings on radiological and other examination of gastrointestinal tract 04/04/2012  . Insomnia, idiopathic 10/04/2010  . Hyperlipidemia 09/22/2009  . Essential hypertension 09/22/2009  . CAD, NATIVE VESSEL 09/22/2009  . EDEMA 05/19/2009  . Hypothyroidism 02/23/2009  . EXTRINSIC ASTHMA, UNSPECIFIED 02/10/2009  . KERATOSIS, SEBORRHEIC [place] 04/16/2007  . SKIN CANCER, HX OF 02/14/2007  . Depression 01/24/2007  . MYOCARDIAL INFARCTION, HX OF 01/24/2007  . GERD 01/24/2007  . Osteoarthritis 01/24/2007  . COLONIC POLYPS, HX OF 01/24/2007  . IRRITABLE BOWEL SYNDROME, HX OF 01/24/2007    Makaylee Spielberg,ANGIE PTA 11/20/2017, 10:53 AM  [address] [place], [place], [address] Phone: [phone]   Fax:  [phone]  Name:  Rebecca [name] MRN: [id number] Date of Birth: Feb 02, 1946

## 2017-11-22 ENCOUNTER — Ambulatory Visit: Payer: Medicare Other | Admitting: Physical Therapy

## 2017-11-22 DIAGNOSIS — M25511 Pain in right shoulder: Secondary | ICD-10-CM | POA: Diagnosis not present

## 2017-11-22 DIAGNOSIS — M6281 Muscle weakness (generalized): Secondary | ICD-10-CM | POA: Diagnosis not present

## 2017-11-22 DIAGNOSIS — R262 Difficulty in walking, not elsewhere classified: Secondary | ICD-10-CM | POA: Diagnosis not present

## 2017-11-22 DIAGNOSIS — M25562 Pain in left knee: Secondary | ICD-10-CM | POA: Diagnosis not present

## 2017-11-22 DIAGNOSIS — M25561 Pain in right knee: Secondary | ICD-10-CM | POA: Diagnosis not present

## 2017-11-22 NOTE — Therapy (Signed)
[address] [place], [place], [address] Phone: [phone]   Fax:  [phone]  Physical Therapy Treatment  Patient Details  Name: Rebecca [name] MRN: [id number] Date of Birth: Sep 11, 1945 Referring Provider: Molli Barrows   Encounter Date: 11/22/2017  PT End of Session - 11/22/17 1046    Visit Number  20    Date for PT Re-Evaluation  12/01/17    PT Start Time  7824    PT Stop Time  1055    PT Time Calculation (min)  40 min       Past Medical History:  Diagnosis Date  . Asthma    extrinsic  . CAD (coronary artery disease)   . Chest pain, atypical   . Diabetes ([place])   . Edema   . Fatigue   . Fatigue   . GERD (gastroesophageal reflux disease)   . Hyperlipidemia   . Hypertension    familiar  . Hypothyroidism   . IBS (irritable bowel syndrome)    hx  . Keratosis, seborrheic   . Labyrinthitis   . MI (myocardial infarction) ([place])    age 14  . Osteoarthritis   . Skin cancer    Melanoma     Past Surgical History:  Procedure Laterality Date  . Breast Biopsy     dense breast tissue  . BREAST EXCISIONAL BIOPSY Left 2000   benign  . CHOLECYSTECTOMY    . EUS  04/04/2012   Procedure: UPPER ENDOSCOPIC ULTRASOUND (EUS) LINEAR;  Surgeon: Milus Banister, MD;  [facility];  Service: Endoscopy;  Laterality: N/A;  radial linear  . KNEE ARTHROPLASTY     total right  02-28-2010 Dr Pilar Plate Aluisio, left  . KNEE ARTHROPLASTY Bilateral 09/12/2017   Hector Shade  . LEFT HEART CATH AND CORONARY ANGIOGRAPHY N/A 04/20/2017   Procedure: LEFT HEART CATH AND CORONARY ANGIOGRAPHY;  Surgeon: Burnell Blanks, MD;  Location: [facility];  Service: Cardiovascular;  Laterality: N/A;  . TUBAL LIGATION      There were no vitals filed for this visit.  Subjective Assessment - 11/22/17 1019    Subjective  brought in knee braces for stab- can you help me use    Currently in Pain?  No/denies                        [facility] Adult PT Treatment/Exercise - 11/22/17 0001      Knee/Hip Exercises: Machines for Strengthening   Cybex Knee Extension  5# 2 sets 10 with brace on    Cybex Knee Flexion  20# 3 sets10 with brace on    Cybex Leg Press  30#  3 sets 10  braces on      Knee/Hip Exercises: Standing   Lateral Step Up  Both;15 reps;Hand Hold: 2;Step Height: 6" braces on    Forward Step Up  Both;15 reps;Step Height: 6" braces on      Knee/Hip Exercises: Seated   Sit to Sand  without UE support;10 reps on airex . 10 off airex      Shoulder Exercises: Standing   Other Standing Exercises  Rt shld isometrics all direction 10 times      Shoulder Exercises: ROM/Strengthening   UBE (Upper Arm Bike)  L 3 2 fwd/2 back    Other ROM/Strengthening Exercises  3# seated row,ext and horz abd 15 2 sets  PT Short Term Goals - 11/01/17 1034      PT SHORT TERM GOAL #2   Title  independent with knee HEP    Status  Achieved        PT Long Term Goals - 11/22/17 1041      PT LONG TERM GOAL #1   Title  decrease pain 50% for ADLs    Status  Partially Met      PT LONG TERM GOAL #2   Title  increase overall shoulder strength 5/5 for functional use     Status  Partially Met      PT LONG TERM GOAL #3   Title  reports attending normal exercise classes regularly without pain or limitation for QOL    Status  Partially Met      PT LONG TERM GOAL #4   Title  reports being able to do hair without pain or limitation    Status  Partially Met      PT LONG TERM GOAL #5   Title  go up and down stairs step over step    Status  Partially Met            Plan - 11/22/17 1046    Clinical Impression Statement  progressing with goals. minimal to no knee pain/popping with ex with braces on. added shld isometrics with cuing    PT Treatment/Interventions  Cryotherapy;Electrical Stimulation;Iontophoresis 35m/ml Dexamethasone;Moist Heat;Therapeutic  activities;Therapeutic exercise;Patient/family education;Manual techniques;Vasopneumatic Device    PT Next Visit Plan  HOLD after next week pending MRI       Patient will benefit from skilled therapeutic intervention in order to improve the following deficits and impairments:  Pain, Decreased range of motion, Decreased strength, Impaired UE functional use, Increased edema, Impaired tone, Abnormal gait, Decreased balance, Decreased mobility, Impaired flexibility  Visit Diagnosis: Acute pain of right knee  Acute pain of right shoulder  Difficulty in walking, not elsewhere classified  Acute pain of left knee  Muscle weakness (generalized)     Problem List Patient Active Problem List   Diagnosis Date Noted  . Coronary artery disease   . Dysuria 02/17/2017  . Diarrhea 02/17/2017  . Vitamin D deficiency 11/07/2016  . Acute on chronic diastolic CHF (congestive heart failure), NYHA class 1 ([place] 11/18/2015  . Diabetes mellitus without complication ([place] 015/37/9432 . Reactive airway disease with acute exacerbation 09/10/2014  . Nonspecific (abnormal) findings on radiological and other examination of gastrointestinal tract 04/04/2012  . Insomnia, idiopathic 10/04/2010  . Hyperlipidemia 09/22/2009  . Essential hypertension 09/22/2009  . CAD, NATIVE VESSEL 09/22/2009  . EDEMA 05/19/2009  . Hypothyroidism 02/23/2009  . EXTRINSIC ASTHMA, UNSPECIFIED 02/10/2009  . KERATOSIS, SEBORRHEIC [place]09/30/2008  . SKIN CANCER, HX OF 02/14/2007  . Depression 01/24/2007  . MYOCARDIAL INFARCTION, HX OF 01/24/2007  . GERD 01/24/2007  . Osteoarthritis 01/24/2007  . COLONIC POLYPS, HX OF 01/24/2007  . IRRITABLE BOWEL SYNDROME, HX OF 01/24/2007    Rosi Secrist,ANGIE PTA 11/22/2017, 10:49 AM  [address] [address]Phone: [phone]  Fax:  [phone] Name: Rebecca BETTERMRN: [id number] of Birth:  1November 18, 1947

## 2017-11-27 ENCOUNTER — Ambulatory Visit: Payer: Medicare Other | Admitting: Physical Therapy

## 2017-11-27 DIAGNOSIS — M25511 Pain in right shoulder: Secondary | ICD-10-CM | POA: Diagnosis not present

## 2017-11-27 DIAGNOSIS — M25562 Pain in left knee: Secondary | ICD-10-CM | POA: Diagnosis not present

## 2017-11-27 DIAGNOSIS — M6281 Muscle weakness (generalized): Secondary | ICD-10-CM | POA: Diagnosis not present

## 2017-11-27 DIAGNOSIS — M25561 Pain in right knee: Secondary | ICD-10-CM

## 2017-11-27 DIAGNOSIS — R262 Difficulty in walking, not elsewhere classified: Secondary | ICD-10-CM | POA: Diagnosis not present

## 2017-11-27 NOTE — Therapy (Signed)
[address] [place], [place], [address] Phone: [phone]   Fax:  [phone]  Physical Therapy Treatment  Patient Details  Name: Rebecca [name] MRN: [id number] Date of Birth: August 05, 1945 Referring Provider: Molli Barrows   Encounter Date: 11/27/2017  PT End of Session - 11/27/17 1045    Visit Number  21    Date for PT Re-Evaluation  12/01/17    PT Start Time  6962    PT Stop Time  1055    PT Time Calculation (min)  40 min       Past Medical History:  Diagnosis Date  . Asthma    extrinsic  . CAD (coronary artery disease)   . Chest pain, atypical   . Diabetes ([place])   . Edema   . Fatigue   . Fatigue   . GERD (gastroesophageal reflux disease)   . Hyperlipidemia   . Hypertension    familiar  . Hypothyroidism   . IBS (irritable bowel syndrome)    hx  . Keratosis, seborrheic   . Labyrinthitis   . MI (myocardial infarction) ([place])    age 48  . Osteoarthritis   . Skin cancer    Melanoma     Past Surgical History:  Procedure Laterality Date  . Breast Biopsy     dense breast tissue  . BREAST EXCISIONAL BIOPSY Left 2000   benign  . CHOLECYSTECTOMY    . EUS  04/04/2012   Procedure: UPPER ENDOSCOPIC ULTRASOUND (EUS) LINEAR;  Surgeon: Milus Banister, MD;  [facility];  Service: Endoscopy;  Laterality: N/A;  radial linear  . KNEE ARTHROPLASTY     total right  02-28-2010 Dr Pilar Plate Aluisio, left  . KNEE ARTHROPLASTY Bilateral 09/12/2017   Hector Shade  . LEFT HEART CATH AND CORONARY ANGIOGRAPHY N/A 04/20/2017   Procedure: LEFT HEART CATH AND CORONARY ANGIOGRAPHY;  Surgeon: Burnell Blanks, MD;  Location: [facility];  Service: Cardiovascular;  Laterality: N/A;  . TUBAL LIGATION      There were no vitals filed for this visit.  Subjective Assessment - 11/27/17 1020    Subjective  have not tried class at gym yet. overall better- trouble sleeping d/t shld pain    Currently  in Pain?  No/denies                       [facility] Adult PT Treatment/Exercise - 11/27/17 0001      Knee/Hip Exercises: Aerobic   Nustep  L 5 6 min      Knee/Hip Exercises: Standing   Lateral Step Up  Both;15 reps;Hand Hold: 2;Step Height: 6" opp leg abd    Forward Step Up  Both;15 reps;Step Height: 6" opp leg ext    Other Standing Knee Exercises  3# hip ext, abd , flex with ER 15      Knee/Hip Exercises: Seated   Long Arc Quad  Strengthening;Both;15 reps;Weights ball squeeze    Ball Squeeze  20 times 3 sec    Marching  Strengthening;Both;20 reps;Weights with abd step up    [employer]  3 lbs.      Shoulder Exercises: Standing   External Rotation  Strengthening;Both;20 reps;Theraband      Shoulder Exercises: ROM/Strengthening   Other ROM/Strengthening Exercises  3# seated row,ext and horz abd 15 2 sets               PT Short Term Goals -  11/01/17 1034      PT SHORT TERM GOAL #2   Title  independent with knee HEP    Status  Achieved        PT Long Term Goals - 11/22/17 1041      PT LONG TERM GOAL #1   Title  decrease pain 50% for ADLs    Status  Partially Met      PT LONG TERM GOAL #2   Title  increase overall shoulder strength 5/5 for functional use     Status  Partially Met      PT LONG TERM GOAL #3   Title  reports attending normal exercise classes regularly without pain or limitation for QOL    Status  Partially Met      PT LONG TERM GOAL #4   Title  reports being able to do hair without pain or limitation    Status  Partially Met      PT LONG TERM GOAL #5   Title  go up and down stairs step over step    Status  Partially Met            Plan - 11/27/17 1045    Clinical Impression Statement  minimal to no shld pain with ex and no LE popping. cuing for slow control LE mvmt    PT Treatment/Interventions  Cryotherapy;Electrical Stimulation;Iontophoresis '4mg'$ /ml Dexamethasone;Moist Heat;Therapeutic activities;Therapeutic  exercise;Patient/family education;Manual techniques;Vasopneumatic Device    PT Next Visit Plan  HOLD after next visit       Patient will benefit from skilled therapeutic intervention in order to improve the following deficits and impairments:  Pain, Decreased range of motion, Decreased strength, Impaired UE functional use, Increased edema, Impaired tone, Abnormal gait, Decreased balance, Decreased mobility, Impaired flexibility  Visit Diagnosis: Acute pain of right knee  Difficulty in walking, not elsewhere classified  Acute pain of right shoulder  Acute pain of left knee  Muscle weakness (generalized)     Problem List Patient Active Problem List   Diagnosis Date Noted  . Coronary artery disease   . Dysuria 02/17/2017  . Diarrhea 02/17/2017  . Vitamin D deficiency 11/07/2016  . Acute on chronic diastolic CHF (congestive heart failure), NYHA class 1 ([place]) 11/18/2015  . Diabetes mellitus without complication ([place]) 54/65/0354  . Reactive airway disease with acute exacerbation 09/10/2014  . Nonspecific (abnormal) findings on radiological and other examination of gastrointestinal tract 04/04/2012  . Insomnia, idiopathic 10/04/2010  . Hyperlipidemia 09/22/2009  . Essential hypertension 09/22/2009  . CAD, NATIVE VESSEL 09/22/2009  . EDEMA 05/19/2009  . Hypothyroidism 02/23/2009  . EXTRINSIC ASTHMA, UNSPECIFIED 02/10/2009  . KERATOSIS, SEBORRHEIC [place] 04/16/2007  . SKIN CANCER, HX OF 02/14/2007  . Depression 01/24/2007  . MYOCARDIAL INFARCTION, HX OF 01/24/2007  . GERD 01/24/2007  . Osteoarthritis 01/24/2007  . COLONIC POLYPS, HX OF 01/24/2007  . IRRITABLE BOWEL SYNDROME, HX OF 01/24/2007    Jessyca Sloan,ANGIE PTA 11/27/2017, 10:47 AM  [address] [place], [place], [address] Phone: [phone]   Fax:  [phone]  Name: Rebecca [name] MRN: [id number] Date of Birth: 04/05/1946

## 2017-11-29 ENCOUNTER — Ambulatory Visit: Payer: Medicare Other | Admitting: Physical Therapy

## 2017-11-29 DIAGNOSIS — R262 Difficulty in walking, not elsewhere classified: Secondary | ICD-10-CM

## 2017-11-29 DIAGNOSIS — M6281 Muscle weakness (generalized): Secondary | ICD-10-CM

## 2017-11-29 DIAGNOSIS — M25562 Pain in left knee: Secondary | ICD-10-CM | POA: Diagnosis not present

## 2017-11-29 DIAGNOSIS — M25561 Pain in right knee: Secondary | ICD-10-CM

## 2017-11-29 DIAGNOSIS — M25511 Pain in right shoulder: Secondary | ICD-10-CM | POA: Diagnosis not present

## 2017-11-29 NOTE — Therapy (Addendum)
[address] [place], [place], [address] Phone: [phone]   Fax:  [phone]  Physical Therapy Treatment  Patient Details  Name: Rebecca [name] MRN: [id number] Date of Birth: [birth year] Referring Provider: Molli Barrows   Encounter Date: 11/29/2017  PT End of Session - 11/29/17 1042    Visit Number  22    Date for PT Re-Evaluation  12/01/17    PT Start Time  8338    PT Stop Time  1050    PT Time Calculation (min)  35 min       Past Medical History:  Diagnosis Date  . Asthma    extrinsic  . CAD (coronary artery disease)   . Chest pain, atypical   . Diabetes ([place])   . Edema   . Fatigue   . Fatigue   . GERD (gastroesophageal reflux disease)   . Hyperlipidemia   . Hypertension    familiar  . Hypothyroidism   . IBS (irritable bowel syndrome)    hx  . Keratosis, seborrheic   . Labyrinthitis   . MI (myocardial infarction) ([place])    age 89  . Osteoarthritis   . Skin cancer    Melanoma     Past Surgical History:  Procedure Laterality Date  . Breast Biopsy     dense breast tissue  . BREAST EXCISIONAL BIOPSY Left 2000   benign  . CHOLECYSTECTOMY    . EUS  04/04/2012   Procedure: UPPER ENDOSCOPIC ULTRASOUND (EUS) LINEAR;  Surgeon: Milus Banister, MD;  [facility];  Service: Endoscopy;  Laterality: N/A;  radial linear  . KNEE ARTHROPLASTY     total right  02-28-2010 Dr Pilar Plate Aluisio, left  . KNEE ARTHROPLASTY Bilateral 09/12/2017   Hector Shade  . LEFT HEART CATH AND CORONARY ANGIOGRAPHY N/A 04/20/2017   Procedure: LEFT HEART CATH AND CORONARY ANGIOGRAPHY;  Surgeon: Burnell Blanks, MD;  Location: [facility];  Service: Cardiovascular;  Laterality: N/A;  . TUBAL LIGATION      There were no vitals filed for this visit.  Subjective Assessment - 11/29/17 1018    Subjective  doing really well and doing most of what I want    Currently in Pain?  No/denies                        [facility] Adult PT Treatment/Exercise - 11/29/17 0001      Knee/Hip Exercises: Aerobic   Elliptical  2 fwd/2 back      Knee/Hip Exercises: Machines for Strengthening   Cybex Knee Extension  5# 2 sets 10 ball squeeze    Cybex Knee Flexion  20# 3 sets10    Cybex Leg Press  40# 2 sets 10 with ball squeeze      Knee/Hip Exercises: Standing   Walking with Sports Cord  40# fwd/back and SW    Other Standing Knee Exercises  STS with UE 3#       Shoulder Exercises: Standing   Other Standing Exercises  pulleys 3 way 5# 15 each      Shoulder Exercises: ROM/Strengthening   UBE (Upper Arm Bike)  L 3 2 fwd/2 back standing               PT Short Term Goals - 11/01/17 1034      PT SHORT TERM GOAL #2   Title  independent with knee HEP    Status  Achieved        PT Long Term Goals - 11/29/17 1043      PT LONG TERM GOAL #1   Title  decrease pain 50% for ADLs    Status  Achieved      PT LONG TERM GOAL #2   Title  increase overall shoulder strength 5/5 for functional use     Status  Partially Met      PT LONG TERM GOAL #3   Title  reports attending normal exercise classes regularly without pain or limitation for QOL    Status  Partially Met      PT LONG TERM GOAL #4   Title  reports being able to do hair without pain or limitation    Status  Partially Met      PT LONG TERM GOAL #5   Title  go up and down stairs step over step    Status  Partially Met            Plan - 11/29/17 1044    Clinical Impression Statement  most goals met, still some shld weakness and pain . pt has not resumed all classes    PT Treatment/Interventions  Cryotherapy;Electrical Stimulation;Iontophoresis 60m/ml Dexamethasone;Moist Heat;Therapeutic activities;Therapeutic exercise;Patient/family education;Manual techniques;Vasopneumatic Device    PT Next Visit Plan  HOLD- pending MRI and resume gym classes       Patient will benefit from skilled therapeutic  intervention in order to improve the following deficits and impairments:  Pain, Decreased range of motion, Decreased strength, Impaired UE functional use, Increased edema, Impaired tone, Abnormal gait, Decreased balance, Decreased mobility, Impaired flexibility  Visit Diagnosis: Acute pain of right knee  Difficulty in walking, not elsewhere classified  Acute pain of right shoulder  Acute pain of left knee  Muscle weakness (generalized)     Problem List Patient Active Problem List   Diagnosis Date Noted  . Coronary artery disease   . Dysuria 02/17/2017  . Diarrhea 02/17/2017  . Vitamin D deficiency 11/07/2016  . Acute on chronic diastolic CHF (congestive heart failure), NYHA class 1 ([place] 11/18/2015  . Diabetes mellitus without complication ([place] 067/25/5001 . Reactive airway disease with acute exacerbation 09/10/2014  . Nonspecific (abnormal) findings on radiological and other examination of gastrointestinal tract 04/04/2012  . Insomnia, idiopathic 10/04/2010  . Hyperlipidemia 09/22/2009  . Essential hypertension 09/22/2009  . CAD, NATIVE VESSEL 09/22/2009  . EDEMA 05/19/2009  . Hypothyroidism 02/23/2009  . EXTRINSIC ASTHMA, UNSPECIFIED 02/10/2009  . KERATOSIS, SEBORRHEIC [place]/30/2008  . SKIN CANCER, HX OF 02/14/2007  . Depression 01/24/2007  . MYOCARDIAL INFARCTION, HX OF 01/24/2007  . GERD 01/24/2007  . Osteoarthritis 01/24/2007  . COLONIC POLYPS, HX OF 01/24/2007  . IRRITABLE BOWEL SYNDROME, HX OF 01/24/2007  PHYSICAL THERAPY DISCHARGE SUMMARY   Plan: Patient agrees to discharge.  Patient goals were partially met. Patient is being discharged due to being pleased with the current functional level.  ?????       [name],Rebecca PTA 11/29/2017, 10:45 AM  [address] [address]Phone: [phone]  Fax:  [phone] Name: Rebecca COPPAMRN: [id number] of Birth:  104/08/1945

## 2017-12-03 DIAGNOSIS — S43431A Superior glenoid labrum lesion of right shoulder, initial encounter: Secondary | ICD-10-CM | POA: Diagnosis not present

## 2017-12-03 DIAGNOSIS — M75121 Complete rotator cuff tear or rupture of right shoulder, not specified as traumatic: Secondary | ICD-10-CM | POA: Diagnosis not present

## 2017-12-03 DIAGNOSIS — M19011 Primary osteoarthritis, right shoulder: Secondary | ICD-10-CM | POA: Diagnosis not present

## 2017-12-03 DIAGNOSIS — M7501 Adhesive capsulitis of right shoulder: Secondary | ICD-10-CM | POA: Diagnosis not present

## 2017-12-07 ENCOUNTER — Ambulatory Visit: Payer: Medicare Other

## 2017-12-17 ENCOUNTER — Other Ambulatory Visit: Payer: Self-pay | Admitting: Adult Health

## 2017-12-17 ENCOUNTER — Telehealth: Payer: Self-pay | Admitting: Adult Health

## 2017-12-17 MED ORDER — GLUCOSE BLOOD VI STRP: ORAL_STRIP | 3 refills | Status: DC

## 2017-12-17 NOTE — Telephone Encounter (Signed)
TC to patient. Stated accu-check lancets and test strips must always be sent to her [facility] according to Medicare. New refill on test strips sent to [facility] as requested.

## 2017-12-17 NOTE — Telephone Encounter (Signed)
Copied from [place] [id number]. Topic: Quick Communication - Rx Refill/Question >> Dec 17, 2017 12:01 PM Nils Flack, Marland Kitchen wrote: Medication: accu check test strips   Has the patient contacted their pharmacy? Yes.   (Agent: If no, request that the patient contact the pharmacy for the refill.) (Agent: If yes, when and what did the pharmacy advise?)  Preferred Pharmacy (with phone number or street name): [email or web address]    Agent: Please be advised that RX refills may take up to 3 business days. We ask that you follow-up with your pharmacy.

## 2017-12-18 NOTE — Telephone Encounter (Signed)
Sent to the pharmacy by e-scribe. 

## 2017-12-20 ENCOUNTER — Ambulatory Visit
Admission: RE | Admit: 2017-12-20 | Discharge: 2017-12-20 | Disposition: A | Discharge status: Home / Self Care | Payer: Medicare Other | Source: Ambulatory Visit | Attending: Adult Health | Admitting: Adult Health

## 2017-12-20 DIAGNOSIS — Z780 Asymptomatic menopausal state: Secondary | ICD-10-CM | POA: Diagnosis not present

## 2017-12-20 DIAGNOSIS — Z1231 Encounter for screening mammogram for malignant neoplasm of breast: Secondary | ICD-10-CM | POA: Diagnosis not present

## 2017-12-20 DIAGNOSIS — E559 Vitamin D deficiency, unspecified: Secondary | ICD-10-CM

## 2017-12-20 DIAGNOSIS — M8588 Other specified disorders of bone density and structure, other site: Secondary | ICD-10-CM | POA: Diagnosis not present

## 2017-12-20 DIAGNOSIS — M810 Age-related osteoporosis without current pathological fracture: Secondary | ICD-10-CM | POA: Diagnosis not present

## 2017-12-20 DIAGNOSIS — E2839 Other primary ovarian failure: Secondary | ICD-10-CM

## 2017-12-25 ENCOUNTER — Encounter: Payer: Self-pay | Admitting: *Deleted

## 2017-12-25 DIAGNOSIS — M810 Age-related osteoporosis without current pathological fracture: Secondary | ICD-10-CM | POA: Insufficient documentation

## 2018-01-11 DIAGNOSIS — E119 Type 2 diabetes mellitus without complications: Secondary | ICD-10-CM | POA: Diagnosis not present

## 2018-01-11 DIAGNOSIS — H0100A Unspecified blepharitis right eye, upper and lower eyelids: Secondary | ICD-10-CM | POA: Diagnosis not present

## 2018-01-11 DIAGNOSIS — H524 Presbyopia: Secondary | ICD-10-CM | POA: Diagnosis not present

## 2018-01-11 DIAGNOSIS — Z961 Presence of intraocular lens: Secondary | ICD-10-CM | POA: Diagnosis not present

## 2018-01-14 ENCOUNTER — Encounter: Payer: Self-pay | Admitting: Cardiology

## 2018-01-14 DIAGNOSIS — S43431A Superior glenoid labrum lesion of right shoulder, initial encounter: Secondary | ICD-10-CM | POA: Diagnosis not present

## 2018-01-14 DIAGNOSIS — M75121 Complete rotator cuff tear or rupture of right shoulder, not specified as traumatic: Secondary | ICD-10-CM | POA: Diagnosis not present

## 2018-01-14 DIAGNOSIS — M19011 Primary osteoarthritis, right shoulder: Secondary | ICD-10-CM | POA: Diagnosis not present

## 2018-01-15 ENCOUNTER — Telehealth: Payer: Self-pay | Admitting: *Deleted

## 2018-01-15 NOTE — Telephone Encounter (Signed)
Information has been sent for insurance verification.

## 2018-01-15 NOTE — Telephone Encounter (Signed)
-----   Message from Hulda Humphrey, [place] sent at 12/25/2017  4:34 PM EDT ----- Regarding: Rebecca [name], this pt would like to start Prolia injections.  Please take necessary steps to see if she qualifies.  Thanks

## 2018-01-28 ENCOUNTER — Ambulatory Visit (INDEPENDENT_AMBULATORY_CARE_PROVIDER_SITE_OTHER): Payer: Medicare Other | Admitting: Cardiology

## 2018-01-28 ENCOUNTER — Encounter: Payer: Self-pay | Admitting: Cardiology

## 2018-01-28 VITALS — BP 128/70 | HR 77 | Ht <= 58 in | Wt 143.0 lb

## 2018-01-28 DIAGNOSIS — E785 Hyperlipidemia, unspecified: Secondary | ICD-10-CM | POA: Diagnosis not present

## 2018-01-28 DIAGNOSIS — I10 Essential (primary) hypertension: Secondary | ICD-10-CM

## 2018-01-28 DIAGNOSIS — I2510 Atherosclerotic heart disease of native coronary artery without angina pectoris: Secondary | ICD-10-CM | POA: Diagnosis not present

## 2018-01-28 DIAGNOSIS — Z01810 Encounter for preprocedural cardiovascular examination: Secondary | ICD-10-CM

## 2018-01-28 NOTE — Patient Instructions (Signed)
Medication Instructions:  None  Labwork: None  Testing/Procedures: None  Follow-Up: Your physician wants you to follow-up in: 6 Month with Dr. [name]. You will receive a reminder letter in the mail two months in advance. If you don't receive a letter, please call our office to schedule the follow-up appointment.    If you need a refill on your cardiac medications before your next appointment, please call your pharmacy.

## 2018-01-28 NOTE — Progress Notes (Signed)
Patient ID: Rebecca [name], female   DOB: 1945-10-14, 72 y.o.   MRN: [id number]    Patient ID: Rebecca [name], female   DOB: Dec 02, 1945, 72 y.o.   MRN: [id number]  Patient Name: Rebecca [name] Date of Encounter: 01/28/2018  Primary Care Provider:  [name], NP Primary Cardiologist:  Ena Dawley  Patient Profile  Chief complain: Chest pain  Problem List   Past Medical History:  Diagnosis Date  . Asthma    extrinsic  . CAD (coronary artery disease)   . Chest pain, atypical   . Diabetes ([place])   . Edema   . Fatigue   . Fatigue   . GERD (gastroesophageal reflux disease)   . Hyperlipidemia   . Hypertension    familiar  . Hypothyroidism   . IBS (irritable bowel syndrome)    hx  . Keratosis, seborrheic   . Labyrinthitis   . MI (myocardial infarction) ([place])    age 86  . Osteoarthritis   . Skin cancer    Melanoma    Past Surgical History:  Procedure Laterality Date  . Breast Biopsy     dense breast tissue  . BREAST EXCISIONAL BIOPSY Left 2000   benign  . CHOLECYSTECTOMY    . EUS  04/04/2012   Procedure: UPPER ENDOSCOPIC ULTRASOUND (EUS) LINEAR;  Surgeon: Milus Banister, MD;  Location: [facility];  Service: Endoscopy;  Laterality: N/A;  radial linear  . KNEE ARTHROPLASTY     total right  02-28-2010 Dr Pilar Plate Aluisio, left  . KNEE ARTHROPLASTY Bilateral 09/12/2017   Hector Shade  . LEFT HEART CATH AND CORONARY ANGIOGRAPHY N/A 04/20/2017   Procedure: LEFT HEART CATH AND CORONARY ANGIOGRAPHY;  Surgeon: Burnell Blanks, MD;  Location: [facility];  Service: Cardiovascular;  Laterality: N/A;  . TUBAL LIGATION      Allergies  Allergies  Allergen Reactions  . Sulfa Antibiotics   . Sulfonamide Derivatives     REACTION: Kathreen Cosier syndrome    Chief complain: Follow up for CAD  HPI  72 year old female with h/o CAD, MI in 1992, cath x 3, last time in 2010 with PCI to the RCA. She has been doing well, travelling a lot recently. Denies any  chest pain. She has gain about 13 lbs since the last year. She is complaining of lower extremities edema for which she take double dose of lasix with some relief. No palpitations, syncope.  11/18/2015, patient's coming for 1 year follow-up, she remains very active attending as many as 8-10 glasses of local [employer] per week and experiences no depression or shortness of breath. She states that her symptoms haven't changed. However on 2 occasions when she was shopping she experienced sudden onset chest tightness radiating to her jaw that immediately resolved with sublingual nitroglycerin. She has hard time managing her diabetes she was intolerant to glipizide and metformin and her diabetes is labile despite regular diet and exercise. She has worsening lower extremity edema and now takes 40 mg of Lasix daily. No orthopnea or paroxysmal dyspnea. No palpitations or syncope.   04/18/2017 - this is one year follow-up, the patient has enjoyed these year tremendously, she traveled to [place] and her husband singing group from [facility] that performed in that he can. She has noticed at that trip that she got short of breath easily and on one occasion she had to stay in and she felt fondly tired and had to take 3 nitroglycerin that helped with her symptoms.  She remains very active she exercises 6 times a week but has noticed that despite that she remains tired and has symptoms of chest pain that she attributed to GERD however nitroglycerin resolved her symptoms. She still able to do all activities of daily living and exercise she just doesn't have as much energy and continues to have on and off exertional chest pain. On occasion she will also get diaphoretic with exertion. Denies any dizziness or syncope. Minimal lower extremity edema no orthopnea or proximal nocturnal dyspnea.  January 28, 2018 -the patient is coming after 9 months, she has been doing great, she has been walking and going to gym without any episodes  of chest pain or shortness of breath.  She has been compliant with her medications with no side effects.  Her biggest problem right now is chronic right shoulder pain for which she will require reconstructive surgery by Dr. Theda Sers in the near future.  She brings preoperative evaluation for with her.  Home Medications  Prior to Admission medications   Medication Sig Start Date End Date Taking? Authorizing Provider  acetaminophen (TYLENOL) 325 MG tablet Take 650 mg by mouth every 6 (six) hours as needed.      Historical Provider, MD  albuterol (PROVENTIL HFA) 108 (90 BASE) MCG/ACT inhaler Inhale 2 puffs into the lungs every 6 (six) hours as needed for wheezing. 05/16/12   Dorena Cookey, MD  albuterol (PROVENTIL HFA;VENTOLIN HFA) 108 (90 BASE) MCG/ACT inhaler Inhale 2 puffs into the lungs every 6 (six) hours as needed for wheezing.    Historical Provider, MD  albuterol (PROVENTIL) (2.5 MG/3ML) 0.083% nebulizer solution Take 3 mLs (2.5 mg total) by nebulization 4 (four) times daily. 05/16/12   Dorena Cookey, MD  aspirin 325 MG tablet Take 325 mg by mouth daily.      Historical Provider, MD  atorvastatin (LIPITOR) 80 MG tablet Take 1 tablet (80 mg total) by mouth daily. 05/16/12   Dorena Cookey, MD  beclomethasone (QVAR) 80 MCG/ACT inhaler Inhale 2 puffs into the lungs 2 (two) times daily. 05/16/12   Dorena Cookey, MD  beclomethasone (QVAR) 80 MCG/ACT inhaler Inhale 1 puff into the lungs 2 (two) times daily as needed. Shortness of breath    Historical Provider, MD  calcium carbonate (OS-CAL) 600 MG TABS Take 600 mg by mouth 2 (two) times daily with a meal.      Historical Provider, MD  calcium carbonate (OS-CAL) 600 MG TABS Take 600 mg by mouth daily.    Historical Provider, MD  cetirizine (ZYRTEC) 10 MG tablet Take 10 mg by mouth daily.      Historical Provider, MD  cetirizine (ZYRTEC) 10 MG tablet Take 10 mg by mouth daily as needed for allergies.    Historical Provider, MD  Cholecalciferol  (VITAMIN D) 2000 UNITS CAPS Take 1 capsule by mouth daily.      Historical Provider, MD  conjugated estrogens (PREMARIN) vaginal cream Place 2.70 Applicatorfuls vaginally every 7 (seven) days. weekly 05/16/12   Dorena Cookey, MD  conjugated estrogens (PREMARIN) vaginal cream Place 0.5 g vaginally daily.    Historical Provider, MD  diazepam (VALIUM) 5 MG tablet Take 1 tablet (5 mg total) by mouth every 6 (six) hours as needed for anxiety. 10/17/12   Hoy Morn, MD  diltiazem (CARDIZEM CD) 180 MG 24 hr capsule Take 1 capsule (180 mg total) by mouth daily. 05/16/12   Dorena Cookey, MD  diltiazem (DILACOR XR) 180 MG 24  hr capsule Take 180 mg by mouth daily.    Historical Provider, MD  furosemide (LASIX) 20 MG tablet Take 1 tablet (20 mg total) by mouth 2 (two) times daily as needed. 05/23/12   Dorena Cookey, MD  HYDROcodone-acetaminophen (VICODIN ES) 7.5-750 MG per tablet One half to one tablet 3 times daily when necessary for pain 07/03/12   Dorena Cookey, MD  hyoscyamine (LEVSIN SL) 0.125 MG SL tablet Place 1 tablet (0.125 mg total) under the tongue every 4 (four) hours as needed. 05/16/12   Dorena Cookey, MD  ibuprofen (ADVIL,MOTRIN) 200 MG tablet Take 600 mg by mouth every 6 (six) hours as needed for pain.    Historical Provider, MD  levothyroxine (SYNTHROID, LEVOTHROID) 50 MCG tablet Take 1 tablet (50 mcg total) by mouth daily. 05/16/12   Dorena Cookey, MD  levothyroxine (SYNTHROID, LEVOTHROID) 50 MCG tablet Take 1 tablet (50 mcg total) by mouth daily before breakfast. 03/24/13   Dorena Cookey, MD  Loperamide HCl (IMODIUM A-D PO) Take 1 tablet by mouth as needed.    Historical Provider, MD  Meth-Hyo-M Bl-Benz Acd-Ph Sal (HYOPHEN PO) Take 1 tablet by mouth 2 (two) times daily.    Historical Provider, MD  mometasone (NASONEX) 50 MCG/ACT nasal spray Place 2 sprays into the nose as needed. 05/16/12   Dorena Cookey, MD  mometasone (NASONEX) 50 MCG/ACT nasal spray Place 2 sprays into the nose daily  as needed. breathing    Historical Provider, MD  montelukast (SINGULAIR) 10 MG tablet Take 1 tablet (10 mg total) by mouth daily. 05/16/12   Dorena Cookey, MD  montelukast (SINGULAIR) 10 MG tablet Take 10 mg by mouth as needed.    Historical Provider, MD  Naphazoline-Polyethyl Glycol (EYE DROPS) 0.012-0.2 % SOLN Apply to eye. Artifical eye drops     Historical Provider, MD  nitrofurantoin, macrocrystal-monohydrate, (MACROBID) 100 MG capsule Take 100 mg by mouth 2 (two) times daily.    Historical Provider, MD  nitroGLYCERIN (NITROSTAT) 0.4 MG SL tablet Place 1 tablet (0.4 mg total) under the tongue every 5 (five) minutes as needed. 05/16/12   Dorena Cookey, MD  Omega-3 Fatty Acids (FISH OIL) 1200 MG CAPS Take by mouth.      Historical Provider, MD  omeprazole (PRILOSEC) 20 MG capsule Take 1 capsule (20 mg total) by mouth 2 (two) times daily. 05/16/12   Dorena Cookey, MD  oxyCODONE-acetaminophen (PERCOCET/ROXICET) 5-325 MG per tablet Take 1 tablet by mouth every 4 (four) hours as needed for pain. 10/17/12   Hoy Morn, MD  potassium chloride SA (K-DUR,KLOR-CON) 20 MEQ tablet Take 1 tablet (20 mEq total) by mouth daily. 05/16/12   Dorena Cookey, MD  salicylic acid 6 % gel Apply 1 application topically as needed. For feet    Historical Provider, MD  Salicylic Acid-Cleanser 6 % (CREAM) KIT Apply topically.      Historical Provider, MD    Family History  Family History  Problem Relation Age of Onset  . Heart disease Mother   . Hypertension Mother   . Heart attack Mother 35       MULTIPLES OVER TIME  . Melanoma Daughter 84       METASTATIC  . Breast cancer Sister   . Hypertension Father   . Hypertension Sister     Social History  Social History   Socioeconomic History  . Marital status: Married    Spouse name: Not on file  . Number  of children: 2  . Years of education: Not on file  . Highest education level: Not on file  Occupational History  . Occupation: RN-retired     Employer: RETIRED  Social Needs  . Financial resource strain: Not on file  . Food insecurity:    Worry: Not on file    Inability: Not on file  . Transportation needs:    Medical: Not on file    Non-medical: Not on file  Tobacco Use  . Smoking status: Never Smoker  . Smokeless tobacco: Never Used  Substance and Sexual Activity  . Alcohol use: No    Comment: rarely  . Drug use: No  . Sexual activity: Not on file  Lifestyle  . Physical activity:    Days per week: Not on file    Minutes per session: Not on file  . Stress: Not on file  Relationships  . Social connections:    Talks on phone: Not on file    Gets together: Not on file    Attends religious service: Not on file    Active member of club or organization: Not on file    Attends meetings of clubs or organizations: Not on file    Relationship status: Not on file  . Intimate partner violence:    Fear of current or ex partner: Not on file    Emotionally abused: Not on file    Physically abused: Not on file    Forced sexual activity: Not on file  Other Topics Concern  . Not on file  Social History Narrative   ** Merged History Encounter **         Review of Systems General:  No chills, fever, night sweats or weight changes.  Cardiovascular:  No chest pain, dyspnea on exertion,  + lower exetrmities edema, orthopnea, palpitations, paroxysmal nocturnal dyspnea. Dermatological: No rash, lesions/masses Respiratory: No cough, dyspnea Urologic: No hematuria, dysuria Abdominal:   No nausea, vomiting, diarrhea, bright red blood per rectum, melena, or hematemesis Neurologic:  No visual changes, wkns, changes in mental status. All other systems reviewed and are otherwise negative except as noted above.  Physical Exam  Blood pressure 128/70, heart rate 76 bpm, O2 sats 96% on room air, weight 143 pounds   psych: Normal affect. Neuro: Alert and oriented X 3. Moves all extremities spontaneously. HEENT: Normal  Neck: Supple  without bruits or JVD. Lungs:  Resp regular and unlabored, CTA. Heart: RRR no s3, s4, or murmurs. Abdomen: Soft, non-tender, non-distended, BS + x 4.  Extremities: No clubbing, cyanosis or edema. DP/PT/Radials 2+ and equal bilaterally.  Accessory Clinical Findings  ECG -sinus rhythm, prior inferior infarct, age undetermined, unchanged from prior. This was personally reviewed.   Lipid Panel     Component Value Date/Time   CHOL 201 (H) 11/09/2017 0952   TRIG 125.0 11/09/2017 0952   HDL 50.70 11/09/2017 0952   CHOLHDL 4 11/09/2017 0952   VLDL 25.0 11/09/2017 0952   LDLCALC 126 (H) 11/09/2017 0952   TTE 04/28/2013  Left ventricle: The cavity size was normal. Wall thickness was normal. The estimated ejection fraction was 60%. Wall motion was normal; there were no regional wall motion abnormalities.   Left cardiac catheterization: 04/20/2017   Prox RCA to Mid RCA lesion, 0 %stenosed.  Prox RCA lesion, 40 %stenosed.  Ost LAD to Prox LAD lesion, 20 %stenosed.  Prox LAD to Mid LAD lesion, 30 %stenosed.  The left ventricular systolic function is normal.  LV end diastolic pressure  is normal.  The left ventricular ejection fraction is greater than 65% by visual estimate.  There is no mitral valve regurgitation.   1. Single vessel CAD with patent stent RCA. The RCA stent has mild to moderate restenosis. This does not appear to be flow limiting.  2. Mild disease LAD 3. Normal LV systolic function  Recommendations: Continue medical management of CAD.     Assessment & Plan  1. CAD, S/P PCI RCA in 2010, now stable, continue ASA 81 mg QD, atorvastatin 80 mg QD, omega 3 acids, no BB (asthma). She underwent cardiac catheterization in October 2018 with findings of mild nonobstructive coronary artery disease normal LVEF, medical management was recommended, in fact she is very active and doing well.  2. Hyperlipidemia - lipids at goal on atorvastatin 80 mg daily. It's tolerated  well he will continue.  3. Hypertension - controlled   4. Lower extremity edema - chronic diastolic CHF -well controlled by low-dose of Lasix.  5.  Preoperative evaluation for shoulder surgery. Preop evaluation for shoulder surgery - no contraindication, advised to hold ASA 1 week prior to surgery.  Follow up in 6 months.   Ena Dawley, MD 01/28/2018, 11:17 AM

## 2018-01-30 NOTE — Telephone Encounter (Signed)
Left message on machine for patient to schedule a Prolia injection.  Insurance should cover costs.

## 2018-02-01 NOTE — Telephone Encounter (Signed)
Patient returned call I was going to schedule the Prolia injection but was not 100% if anything further needed in the Process.  Will send to [facility] as Juluis Rainier

## 2018-02-01 NOTE — Telephone Encounter (Signed)
Prolia injection scheduled. °

## 2018-02-07 ENCOUNTER — Encounter: Payer: Self-pay | Admitting: Adult Health

## 2018-02-07 ENCOUNTER — Ambulatory Visit (INDEPENDENT_AMBULATORY_CARE_PROVIDER_SITE_OTHER): Payer: Medicare Other | Admitting: Adult Health

## 2018-02-07 VITALS — BP 150/60 | Temp 97.9°F | Wt 142.0 lb

## 2018-02-07 DIAGNOSIS — I2510 Atherosclerotic heart disease of native coronary artery without angina pectoris: Secondary | ICD-10-CM | POA: Diagnosis not present

## 2018-02-07 DIAGNOSIS — E119 Type 2 diabetes mellitus without complications: Secondary | ICD-10-CM | POA: Diagnosis not present

## 2018-02-07 LAB — POCT GLYCOSYLATED HEMOGLOBIN (HGB A1C): HbA1c, POC (controlled diabetic range): 6.5 % (ref 0.0–7.0)

## 2018-02-07 MED ORDER — ACCU-CHEK NANO SMARTVIEW W/DEVICE KIT: PACK | 0 refills | Status: DC

## 2018-02-07 NOTE — Progress Notes (Signed)
Subjective:    Patient ID: Rebecca [name], female    DOB: 1946/06/14, 72 y.o.   MRN: [id number]  HPI  72 year old female who  has a past medical history of Asthma, CAD (coronary artery disease), Chest pain, atypical, Diabetes ([place]), Edema, Fatigue, Fatigue, GERD (gastroesophageal reflux disease), Hyperlipidemia, Hypertension, Hypothyroidism, IBS (irritable bowel syndrome), Keratosis, seborrheic, Labyrinthitis, MI (myocardial infarction) ([place]), Osteoarthritis, and Skin cancer.  She presents to the office today for three month follow up regarding diabetes. She is currently controlled with Invokana 100 mg daily. She monitors her blood sugar at home and has reading between 100-200's/ She reports that she has not changed her diet and what she is eating does not correspond to her blood sugar readings at home.   She has not been able to be as active as she likes due to right shoulder pain for which she is having surgery for in less than a month   Her last A1c was 7.0 in April 2019  Review of Systems See HPI   Past Medical History:  Diagnosis Date  . Asthma    extrinsic  . CAD (coronary artery disease)   . Chest pain, atypical   . Diabetes ([place])   . Edema   . Fatigue   . Fatigue   . GERD (gastroesophageal reflux disease)   . Hyperlipidemia   . Hypertension    familiar  . Hypothyroidism   . IBS (irritable bowel syndrome)    hx  . Keratosis, seborrheic   . Labyrinthitis   . MI (myocardial infarction) ([place])    age 33  . Osteoarthritis   . Skin cancer    Melanoma     Social History   Socioeconomic History  . Marital status: Married    Spouse name: Not on file  . Number of children: 2  . Years of education: Not on file  . Highest education level: Not on file  Occupational History  . Occupation: RN-retired    Employer: RETIRED  Social Needs  . Financial resource strain: Not on file  . Food insecurity:    Worry: Not on file    Inability: Not on file  . Transportation  needs:    Medical: Not on file    Non-medical: Not on file  Tobacco Use  . Smoking status: Never Smoker  . Smokeless tobacco: Never Used  Substance and Sexual Activity  . Alcohol use: No    Comment: rarely  . Drug use: No  . Sexual activity: Not on file  Lifestyle  . Physical activity:    Days per week: Not on file    Minutes per session: Not on file  . Stress: Not on file  Relationships  . Social connections:    Talks on phone: Not on file    Gets together: Not on file    Attends religious service: Not on file    Active member of club or organization: Not on file    Attends meetings of clubs or organizations: Not on file    Relationship status: Not on file  . Intimate partner violence:    Fear of current or ex partner: Not on file    Emotionally abused: Not on file    Physically abused: Not on file    Forced sexual activity: Not on file  Other Topics Concern  . Not on file  Social History Narrative   ** Merged History Encounter **        Past Surgical History:  Procedure Laterality Date  . Breast Biopsy     dense breast tissue  . BREAST EXCISIONAL BIOPSY Left 2000   benign  . CHOLECYSTECTOMY    . EUS  04/04/2012   Procedure: UPPER ENDOSCOPIC ULTRASOUND (EUS) LINEAR;  Surgeon: Milus Banister, MD;  Location: [facility];  Service: Endoscopy;  Laterality: N/A;  radial linear  . KNEE ARTHROPLASTY     total right  02-28-2010 Dr Pilar Plate Aluisio, left  . KNEE ARTHROPLASTY Bilateral 09/12/2017   Hector Shade  . LEFT HEART CATH AND CORONARY ANGIOGRAPHY N/A 04/20/2017   Procedure: LEFT HEART CATH AND CORONARY ANGIOGRAPHY;  Surgeon: Burnell Blanks, MD;  Location: [facility];  Service: Cardiovascular;  Laterality: N/A;  . TUBAL LIGATION      Family History  Problem Relation Age of Onset  . Heart disease Mother   . Hypertension Mother   . Heart attack Mother 45       MULTIPLES OVER TIME  . Melanoma Daughter 79       METASTATIC  . Breast cancer Sister     . Hypertension Father   . Hypertension Sister     Allergies  Allergen Reactions  . Sulfa Antibiotics   . Sulfonamide Derivatives     REACTION: Kathreen Cosier syndrome    Current Outpatient Medications on File Prior to Visit  Medication Sig Dispense Refill  . ACCU-CHEK FASTCLIX LANCETS MISC USE   TO CHECK GLUCOSE TWICE DAILY 102 each 5  . ACCU-CHEK SMARTVIEW test strip USE  STRIP TO CHECK GLUCOSE THREE TIMES DAILY 300 each 3  . acetaminophen (TYLENOL) 325 MG tablet Take 650 mg by mouth every 6 (six) hours as needed.      Marland Kitchen aspirin EC 81 MG tablet Take 1 tablet (81 mg total) by mouth daily. 90 tablet 3  . atorvastatin (LIPITOR) 80 MG tablet Take 1 tablet (80 mg total) by mouth daily. 90 tablet 3  . calcium carbonate (OS-CAL) 600 MG TABS Take 1,200 mg by mouth daily.     . canagliflozin (INVOKANA) 100 MG TABS tablet TAKE 1 TABLET BY MOUTH  DAILY BEFORE BREAKFAST 90 tablet 1  . Cholecalciferol (VITAMIN D) 2000 UNITS CAPS Take 1 capsule by mouth daily.      Marland Kitchen conjugated estrogens (PREMARIN) vaginal cream Place 0.5 Applicatorfuls vaginally every 7 (seven) days. 42.5 g 6  . diltiazem (CARDIZEM CD) 180 MG 24 hr capsule Take 1 capsule (180 mg total) by mouth daily. 90 capsule 3  . furosemide (LASIX) 20 MG tablet Take 1 tablet (20 mg total) by mouth 2 (two) times daily. 180 tablet 3  . levothyroxine (SYNTHROID, LEVOTHROID) 50 MCG tablet TAKE 1 TABLET BY MOUTH  DAILY BEFORE BREAKFAST 90 tablet 3  . Naphazoline-Polyethyl Glycol (EYE DROPS) 0.012-0.2 % SOLN Apply to eye as needed. Artifical eye drops    . nitroGLYCERIN (NITROSTAT) 0.4 MG SL tablet DISSOLVE 1 TABLET UNDER THE TONGUE EVERY 5 MINUTES AS  NEEDED FOR CHEST PAIN. MAX  3 DOSES IN 15 MINUTES. CALL 911 IF CHEST PAIN PERSISTS 100 tablet 6  . Omega-3 Fatty Acids (FISH OIL) 1200 MG CAPS Take by mouth.      . potassium chloride SA (K-DUR,KLOR-CON) 20 MEQ tablet Take 1 tablet (20 mEq total) by mouth 2 (two) times daily. 180 tablet 3  .  traZODone (DESYREL) 50 MG tablet Take 0.5-1 tablets (25-50 mg total) by mouth at bedtime as needed for sleep. 90 tablet 1   No current facility-administered medications on  file prior to visit.     There were no vitals taken for this visit.      Objective:   Physical Exam  Constitutional: She is oriented to person, place, and time. She appears well-developed and well-nourished. No distress.  Eyes: Pupils are equal, round, and reactive to light. Conjunctivae and EOM are normal.  Neck: Normal range of motion. Neck supple.  Cardiovascular: Normal rate, regular rhythm and normal heart sounds.  Pulmonary/Chest: Effort normal and breath sounds normal.  Musculoskeletal: She exhibits tenderness (right shoulder ).  Neurological: She is alert and oriented to person, place, and time.  Skin: Skin is warm. She is not diaphoretic.  Psychiatric: She has a normal mood and affect. Her behavior is normal. Judgment and thought content normal.  Nursing note and vitals reviewed.     Assessment & Plan:  1. Diabetes mellitus without complication ([place]) - POCT A1C - 6.5  - Has improved  - Prescription given for new meter.    * She is due for her first prolia injection today. Due to small risk of increased infection I am going to have her wait until after her shoulder surgery to start tis medication. She is ok with this plan   [name], NP

## 2018-02-08 ENCOUNTER — Encounter: Payer: Self-pay | Admitting: Adult Health

## 2018-02-08 DIAGNOSIS — E119 Type 2 diabetes mellitus without complications: Secondary | ICD-10-CM

## 2018-02-12 MED ORDER — ACCU-CHEK GUIDE VI STRP: ORAL_STRIP | 3 refills | Status: DC

## 2018-02-28 DIAGNOSIS — S43431A Superior glenoid labrum lesion of right shoulder, initial encounter: Secondary | ICD-10-CM | POA: Diagnosis not present

## 2018-02-28 DIAGNOSIS — M7521 Bicipital tendinitis, right shoulder: Secondary | ICD-10-CM | POA: Diagnosis not present

## 2018-02-28 DIAGNOSIS — G8918 Other acute postprocedural pain: Secondary | ICD-10-CM | POA: Diagnosis not present

## 2018-02-28 DIAGNOSIS — X58XXXA Exposure to other specified factors, initial encounter: Secondary | ICD-10-CM | POA: Diagnosis not present

## 2018-02-28 DIAGNOSIS — Y999 Unspecified external cause status: Secondary | ICD-10-CM | POA: Diagnosis not present

## 2018-02-28 DIAGNOSIS — S46011A Strain of muscle(s) and tendon(s) of the rotator cuff of right shoulder, initial encounter: Secondary | ICD-10-CM | POA: Diagnosis not present

## 2018-02-28 DIAGNOSIS — S46191A Other injury of muscle, fascia and tendon of long head of biceps, right arm, initial encounter: Secondary | ICD-10-CM | POA: Diagnosis not present

## 2018-02-28 DIAGNOSIS — M19011 Primary osteoarthritis, right shoulder: Secondary | ICD-10-CM | POA: Diagnosis not present

## 2018-02-28 DIAGNOSIS — M75121 Complete rotator cuff tear or rupture of right shoulder, not specified as traumatic: Secondary | ICD-10-CM | POA: Diagnosis not present

## 2018-02-28 DIAGNOSIS — M7551 Bursitis of right shoulder: Secondary | ICD-10-CM | POA: Diagnosis not present

## 2018-02-28 DIAGNOSIS — M7541 Impingement syndrome of right shoulder: Secondary | ICD-10-CM | POA: Diagnosis not present

## 2018-03-01 DIAGNOSIS — Z9889 Other specified postprocedural states: Secondary | ICD-10-CM

## 2018-03-01 HISTORY — DX: Other specified postprocedural states: Z98.890

## 2018-03-06 ENCOUNTER — Ambulatory Visit: Payer: Medicare Other | Attending: Specialist | Admitting: Physical Therapy

## 2018-03-06 ENCOUNTER — Encounter: Payer: Self-pay | Admitting: Physical Therapy

## 2018-03-06 DIAGNOSIS — M25611 Stiffness of right shoulder, not elsewhere classified: Secondary | ICD-10-CM | POA: Diagnosis not present

## 2018-03-06 DIAGNOSIS — R600 Localized edema: Secondary | ICD-10-CM | POA: Insufficient documentation

## 2018-03-06 DIAGNOSIS — M25511 Pain in right shoulder: Secondary | ICD-10-CM

## 2018-03-06 DIAGNOSIS — Z4789 Encounter for other orthopedic aftercare: Secondary | ICD-10-CM | POA: Diagnosis not present

## 2018-03-06 NOTE — Therapy (Signed)
[address] [place], [place], [address] Phone: [phone]   Fax:  [phone]  Physical Therapy Evaluation  Patient Details  Name: Rebecca [name] MRN: [id number] Date of Birth: March 04, 1946 Referring Provider: Jomarie Longs   Encounter Date: 03/06/2018  PT End of Session - 03/06/18 1621    Visit Number  1    Date for PT Re-Evaluation  05/06/18    PT Start Time  1350    PT Stop Time  1449    PT Time Calculation (min)  59 min    Activity Tolerance  Patient tolerated treatment well    Behavior During Therapy  [facility] for tasks assessed/performed       Past Medical History:  Diagnosis Date  . Asthma    extrinsic  . CAD (coronary artery disease)   . Chest pain, atypical   . Diabetes ([place])   . Edema   . Fatigue   . Fatigue   . GERD (gastroesophageal reflux disease)   . Hyperlipidemia   . Hypertension    familiar  . Hypothyroidism   . IBS (irritable bowel syndrome)    hx  . Keratosis, seborrheic   . Labyrinthitis   . MI (myocardial infarction) ([place])    age 72  . Osteoarthritis   . Skin cancer    Melanoma     Past Surgical History:  Procedure Laterality Date  . Breast Biopsy     dense breast tissue  . BREAST EXCISIONAL BIOPSY Left 2000   benign  . CHOLECYSTECTOMY    . EUS  04/04/2012   Procedure: UPPER ENDOSCOPIC ULTRASOUND (EUS) LINEAR;  Surgeon: Milus Banister, MD;  Location: [facility];  Service: Endoscopy;  Laterality: N/A;  radial linear  . KNEE ARTHROPLASTY     total right  02-28-2010 Dr Pilar Plate Aluisio, left  . KNEE ARTHROPLASTY Bilateral 09/12/2017   Hector Shade  . LEFT HEART CATH AND CORONARY ANGIOGRAPHY N/A 04/20/2017   Procedure: LEFT HEART CATH AND CORONARY ANGIOGRAPHY;  Surgeon: Burnell Blanks, MD;  Location: [place] [facility];  Service: Cardiovascular;  Laterality: N/A;  . TUBAL LIGATION      There were no vitals filed for this visit.   Subjective Assessment -  03/06/18 1400    Subjective  Patient underwent a right RC repair, DCR/SAD and biceps tenotomy on 02/28/18.  She reports that she had been having pain for about a year.  She reports that she has been doing well since the surgery but recently is having ulnar nerve inflammation with pain from elbow to the pinkine finger    Limitations  Lifting;House hold activities    Patient Stated Goals  have less pain, better motions, do hair and dress without difficulty    Currently in Pain?  Yes    Pain Score  6     Pain Location  Shoulder    Pain Orientation  Right    Pain Descriptors / Indicators  Aching;Jabbing;Shooting    Pain Type  Acute pain;Surgical pain    Pain Radiating Towards  some pain in the elbow to the little finger    Pain Onset  In the past 7 days    Pain Frequency  Constant    Aggravating Factors   any motions pain can be 8/10    Pain Relieving Factors  rest, ice, sling, OTC and pain meds can decreased the pain to 3/10    Effect of Pain on Daily Activities  difficulty with all ADL's dressing, doing hair         [facility] PT Assessment - 03/06/18 0001      Assessment   Medical Diagnosis  s/p right shoulder RC repair with SAD/DCR and biceps tenotomy    Referring Provider  RA Collins    Onset Date/Surgical Date  02/28/18    Hand Dominance  Right    Prior Therapy  for knee pain      Precautions   Precautions  None      Balance Screen   Has the patient fallen in the past 6 months  No    Has the patient had a decrease in activity level because of a fear of falling?   No    Is the patient reluctant to leave their home because of a fear of falling?   No      Home Environment   Additional Comments  caring for husband (mostly independent but has to help sometimes with ADLs and getting around), some light gardening and housework      Prior Function   Level of Independence  Independent    Vocation  Retired    Leisure  exercise classes (yoga, aerobics, stability, dancing) 5-6x/week  sometimes doing multiple classes a day      Observation/Other Assessments-Edema    Edema  --   swelling and bruising in the right shoulder and to the elbow     Posture/Postural Control   Posture Comments  in sling, fwd head, rounded shoulders, gaurded      ROM / Strength   AROM / PROM / Strength  PROM;Strength      AROM   AROM Assessment Site  Shoulder    Right/Left Shoulder  Right    Right Shoulder Flexion  75 Degrees    Right Shoulder ABduction  60 Degrees    Right Shoulder Internal Rotation  40 Degrees   elbow at side   Right Shoulder External Rotation  10 Degrees   elbow at side     Palpation   Palpation comment  swelling and bruising present around the right shoulder, small bandages in place, tender around the shoulder, mild warmth                Objective measurements completed on examination: See above findings.      [place] Adult PT Treatment/Exercise - 03/06/18 0001      Modalities   Modalities  Vasopneumatic      Vasopneumatic   Number Minutes Vasopneumatic   15 minutes    Vasopnuematic Location   Shoulder    Vasopneumatic Pressure  Medium    Vasopneumatic Temperature   38               PT Short Term Goals - 03/06/18 1655      PT SHORT TERM GOAL #1   Title  independent with initial HEP    Time  2    Period  Weeks    Status  New        PT Long Term Goals - 03/06/18 1656      PT LONG TERM GOAL #1   Title  decrease pain 50% for ADLs    Time  8    Period  Weeks    Status  New      PT LONG TERM GOAL #2   Title  increase AROM of the right shoulder to 140 degrees flexion    Time  8    Period  Weeks    Status  New      PT LONG TERM GOAL #3   Title  increase AROM of right shoulder ER to 70 degrees    Time  8    Period  Weeks    Status  New      PT LONG TERM GOAL #4   Title  reports being able to do hair and dress without pain or limitation    Time  8    Period  Weeks    Status  New      PT LONG TERM GOAL #5   Title  put  3# on head high shelf    Time  8    Period  Weeks    Status  New             Plan - 03/06/18 1621    Clinical Impression Statement  Patient reportsa that she has had right shoulder pain for about a year, she underwent right RC repair with SAD/DCR and biceps tenotomy on 02/28/18.  She reports that she had had little pain, has been wearing the sling.  Reports recently starting to have some ulnar nerve pain.  She has good motin to be only 6 days out of surgery, she was able to allow PROM and relax, some cues needed due to gaurding    Clinical Presentation  Evolving    Clinical Decision Making  Low    Rehab Potential  Good    PT Frequency  3x / week    PT Duration  8 weeks    PT Treatment/Interventions  Cryotherapy;Electrical Stimulation;Iontophoresis 4mg /ml Dexamethasone;Moist Heat;Therapeutic activities;Therapeutic exercise;Patient/family education;Manual techniques;Vasopneumatic Device;Passive range of motion    PT Next Visit Plan  Start RC repair protocol, PROM     Consulted and Agree with Plan of Care  Patient       Patient will benefit from skilled therapeutic intervention in order to improve the following deficits and impairments:  Increased edema, Decreased activity tolerance, Decreased strength, Impaired UE functional use, Pain, Increased muscle spasms, Decreased range of motion, Postural dysfunction  Visit Diagnosis: Acute pain of right shoulder - Plan: PT plan of care cert/re-cert  Stiffness of right shoulder, not elsewhere classified - Plan: PT plan of care cert/re-cert  Localized edema - Plan: PT plan of care cert/re-cert     Problem List Patient Active Problem List   Diagnosis Date Noted  . Osteoporosis 12/25/2017  . Coronary artery disease   . Dysuria 02/17/2017  . Diarrhea 02/17/2017  . Vitamin D deficiency 11/07/2016  . Acute on chronic diastolic CHF (congestive heart failure), NYHA class 1 ([place]) 11/18/2015  . Diabetes mellitus without complication ([place])  56/31/4970  . Reactive airway disease with acute exacerbation 09/10/2014  . Nonspecific (abnormal) findings on radiological and other examination of gastrointestinal tract 04/04/2012  . Insomnia, idiopathic 10/04/2010  . Hyperlipidemia 09/22/2009  . Essential hypertension 09/22/2009  . CAD, NATIVE VESSEL 09/22/2009  . EDEMA 05/19/2009  . Hypothyroidism 02/23/2009  . EXTRINSIC ASTHMA, UNSPECIFIED 02/10/2009  . KERATOSIS, SEBORRHEIC [place] 04/16/2007  . SKIN CANCER, HX OF 02/14/2007  . Depression 01/24/2007  . MYOCARDIAL INFARCTION, HX OF 01/24/2007  . GERD 01/24/2007  . Osteoarthritis 01/24/2007  . COLONIC POLYPS, HX OF 01/24/2007  . IRRITABLE BOWEL SYNDROME, HX OF 01/24/2007    Sumner Boast., PT 03/06/2018, 5:15 PM  [address] [place], [place], [address] Phone: [phone]   Fax:  [phone]  Name: Rebecca [name] MRN: [id number] Date of Birth: [birth year]

## 2018-03-06 NOTE — Patient Instructions (Signed)
Access Code: [email or web address]  URL: [email or web address]  Date: 03/06/2018  Prepared by: Lum Babe   Exercises  Standing Elbow Flexion Extension AROM - 10 reps - 3 sets - 2 hold - 1x daily - 7x weekly  Seated Shoulder Flexion Towel Slide at Table Top - 10 reps - 3 sets - 3 hold - 2x daily - 7x weekly  Seated Shoulder External Rotation PROM on Table - 10 reps - 3 sets - 3 hold - 2x daily - 7x weekly  Seated Scapular Retraction - 10 reps - 1 sets - 3 hold - 2x daily - 7x weekly  Seated Shoulder Shrugs - 10 reps - 2 sets - 2 hold - 2x daily - 7x weekly

## 2018-03-07 ENCOUNTER — Ambulatory Visit (INDEPENDENT_AMBULATORY_CARE_PROVIDER_SITE_OTHER): Payer: Medicare Other | Admitting: Family Medicine

## 2018-03-07 ENCOUNTER — Encounter: Payer: Self-pay | Admitting: Family Medicine

## 2018-03-07 VITALS — BP 138/70 | HR 64 | Temp 97.8°F

## 2018-03-07 DIAGNOSIS — I2510 Atherosclerotic heart disease of native coronary artery without angina pectoris: Secondary | ICD-10-CM | POA: Diagnosis not present

## 2018-03-07 DIAGNOSIS — J40 Bronchitis, not specified as acute or chronic: Secondary | ICD-10-CM | POA: Diagnosis not present

## 2018-03-07 MED ORDER — BENZONATATE 100 MG PO CAPS: 100.0000 mg | ORAL_CAPSULE | Freq: Two times a day (BID) | ORAL | 0 refills | Status: DC | PRN

## 2018-03-07 MED ORDER — ALBUTEROL SULFATE HFA 108 (90 BASE) MCG/ACT IN AERS: 2.0000 | INHALATION_SPRAY | Freq: Four times a day (QID) | RESPIRATORY_TRACT | 2 refills | Status: DC | PRN

## 2018-03-07 MED ORDER — AZITHROMYCIN 250 MG PO TABS: ORAL_TABLET | ORAL | 0 refills | Status: DC

## 2018-03-07 NOTE — Patient Instructions (Signed)

## 2018-03-07 NOTE — Progress Notes (Signed)
Subjective:    Patient ID: [name], female    DOB: 11-19-1945, 72 y.o.   MRN: [id number]  No chief complaint on file.   HPI Patient was seen today for acute concern.  Pt endorses cough, rhinorrhea, headache x4 days.  Initially had sore throat which is improving.  Pt denies fever.  Of note pt recently had right shoulder surgery 1 week ago.  She was given a 4 day course of a cephalosporin s/p the procedure.  Sick contacts include pt's husband who was sick on the day pt had surgery.  Pt has not tried much for her symptoms 2/2 her history of diabetes.  Pt notes a h/o reactive airway disease/asthma for which she has been asymptomatic x a few yrs.  Past Medical History:  Diagnosis Date  . Asthma    extrinsic  . CAD (coronary artery disease)   . Chest pain, atypical   . Diabetes ([place])   . Edema   . Fatigue   . Fatigue   . GERD (gastroesophageal reflux disease)   . Hyperlipidemia   . Hypertension    familiar  . Hypothyroidism   . IBS (irritable bowel syndrome)    hx  . Keratosis, seborrheic   . Labyrinthitis   . MI (myocardial infarction) ([place])    age 42  . Osteoarthritis   . Skin cancer    Melanoma     Allergies  Allergen Reactions  . Sulfa Antibiotics   . Sulfonamide Derivatives     REACTION: Kathreen Cosier syndrome    ROS General: Denies fever, chills, night sweats, changes in weight, changes in appetite HEENT: Denies ear pain, changes in vision   + rhinorrhea, sore throat, HA CV: Denies CP, palpitations, SOB, orthopnea Pulm: Denies SOB, wheezing  +productive cough GI: Denies abdominal pain, nausea, vomiting, diarrhea, constipation GU: Denies dysuria, hematuria, frequency, vaginal discharge Msk: Denies muscle cramps, joint pains Neuro: Denies weakness, numbness, tingling Skin: Denies rashes, bruising Psych: Denies depression, anxiety, hallucinations     Objective:    Blood pressure 138/70, pulse 64, temperature 97.8 F (36.6 C), temperature source Oral,  SpO2 97 %.   Gen. Pleasant, well-nourished, in no distress, normal affect.  Wearing a sling on right arm with multiple ice packs in place. HEENT: /AT, face symmetric, no scleral icterus, PERRLA, nares patent without drainage, pharynx without erythema or exudate. TMs normal b/l.  No cervical lymphadenopathy. Lungs: Productive sounding cough, no accessory muscle use, CTAB, no wheezes or rales Cardiovascular: RRR, no m/r/g, no peripheral edema Neuro:  A&Ox3, CN II-XII intact, normal gait  Wt Readings from Last 3 Encounters:  02/07/18 142 lb (64.4 kg)  01/28/18 143 lb (64.9 kg)  11/09/17 139 lb (63 kg)    Lab Results  Component Value Date   WBC 5.3 11/09/2017   HGB 14.8 11/09/2017   HCT 43.3 11/09/2017   PLT 291.0 11/09/2017   GLUCOSE 106 (H) 11/09/2017   CHOL 201 (H) 11/09/2017   TRIG 125.0 11/09/2017   HDL 50.70 11/09/2017   LDLCALC 126 (H) 11/09/2017   ALT 16 11/09/2017   AST 13 11/09/2017   NA 139 11/09/2017   K 3.7 11/09/2017   CL 100 11/09/2017   CREATININE 0.77 11/09/2017   BUN 13 11/09/2017   CO2 29 11/09/2017   TSH 1.24 11/09/2017   INR 0.9 04/18/2017   HGBA1C 6.5 02/07/2018   MICROALBUR <0.7 11/09/2017    Assessment/Plan:  Bronchitis  -discussed likely 2/2 viral cause.  Given recent surgery pneumonia  a concern, though lungs CTAB on exam.  Pt given a wait and see rx for worsening symptoms.  - Plan: albuterol (PROVENTIL HFA;VENTOLIN HFA) 108 (90 Base) MCG/ACT inhaler, benzonatate (TESSALON) 100 MG capsule, azithromycin (ZITHROMAX) 250 MG tablet -given RTC or ED precautions. F/u prn.  Grier Mitts, MD

## 2018-03-08 ENCOUNTER — Encounter: Payer: Self-pay | Admitting: Physical Therapy

## 2018-03-08 ENCOUNTER — Ambulatory Visit: Payer: Medicare Other | Admitting: Physical Therapy

## 2018-03-08 DIAGNOSIS — M25611 Stiffness of right shoulder, not elsewhere classified: Secondary | ICD-10-CM | POA: Diagnosis not present

## 2018-03-08 DIAGNOSIS — M25511 Pain in right shoulder: Secondary | ICD-10-CM

## 2018-03-08 DIAGNOSIS — R600 Localized edema: Secondary | ICD-10-CM

## 2018-03-08 NOTE — Therapy (Signed)
[place], [place], [address] Phone: [phone]   Fax:  [phone]  Physical Therapy Treatment  Patient Details  Name: Rebecca [name] MRN: [id number] Date of Birth: Apr 08, 1946 Referring Provider: Jomarie Longs   Encounter Date: 03/08/2018  PT End of Session - 03/08/18 1136    Visit Number  2    Date for PT Re-Evaluation  05/06/18    PT Start Time  1100    PT Stop Time  1145    PT Time Calculation (min)  45 min       Past Medical History:  Diagnosis Date  . Asthma    extrinsic  . CAD (coronary artery disease)   . Chest pain, atypical   . Diabetes ([place])   . Edema   . Fatigue   . Fatigue   . GERD (gastroesophageal reflux disease)   . Hyperlipidemia   . Hypertension    familiar  . Hypothyroidism   . IBS (irritable bowel syndrome)    hx  . Keratosis, seborrheic   . Labyrinthitis   . MI (myocardial infarction) ([place])    age 72  . Osteoarthritis   . Skin cancer    Melanoma     Past Surgical History:  Procedure Laterality Date  . Breast Biopsy     dense breast tissue  . BREAST EXCISIONAL BIOPSY Left 2000   benign  . CHOLECYSTECTOMY    . EUS  04/04/2012   Procedure: UPPER ENDOSCOPIC ULTRASOUND (EUS) LINEAR;  Surgeon: Milus Banister, MD;  [facility];  Service: Endoscopy;  Laterality: N/A;  radial linear  . KNEE ARTHROPLASTY     total right  02-28-2010 Dr Pilar Plate Aluisio, left  . KNEE ARTHROPLASTY Bilateral 09/12/2017   Hector Shade  . LEFT HEART CATH AND CORONARY ANGIOGRAPHY N/A 04/20/2017   Procedure: LEFT HEART CATH AND CORONARY ANGIOGRAPHY;  Surgeon: Burnell Blanks, MD;  Location: [facility];  Service: Cardiovascular;  Laterality: N/A;  . TUBAL LIGATION      There were no vitals filed for this visit.  Subjective Assessment - 03/08/18 1134    Subjective  doing pretty well other than add pad is bothering unlnar nerve    Currently in Pain?  Yes    Pain Score  5     Pain Location  Shoulder    Pain Orientation  Right                       [facility] Adult PT Treatment/Exercise - 03/08/18 0001      Modalities   Modalities  Vasopneumatic      Vasopneumatic   Number Minutes Vasopneumatic   15 minutes    Vasopnuematic Location   Shoulder    Vasopneumatic Pressure  Medium    Vasopneumatic Temperature   38      Manual Therapy   Manual Therapy  Passive ROM    Soft tissue mobilization  gentle STW and distraction     Passive ROM  RT shld supine all directions               PT Short Term Goals - 03/06/18 1655      PT SHORT TERM GOAL #1   Title  independent with initial HEP    Time  2    Period  Weeks    Status  New        PT Long Term Goals - 03/06/18  1656      PT LONG TERM GOAL #1   Title  decrease pain 50% for ADLs    Time  8    Period  Weeks    Status  New      PT LONG TERM GOAL #2   Title  increase AROM of the right shoulder to 140 degrees flexion    Time  8    Period  Weeks    Status  New      PT LONG TERM GOAL #3   Title  increase AROM of right shoulder ER to 70 degrees    Time  8    Period  Weeks    Status  New      PT LONG TERM GOAL #4   Title  reports being able to do hair and dress without pain or limitation    Time  8    Period  Weeks    Status  New      PT LONG TERM GOAL #5   Title  put 3# on head high shelf    Time  8    Period  Weeks    Status  New            Plan - 03/08/18 1136    Clinical Impression Statement  verb and tactile cuing to relax with ROM. once relaxed tolerated well.    PT Treatment/Interventions  Cryotherapy;Electrical Stimulation;Iontophoresis 4mg /ml Dexamethasone;Moist Heat;Therapeutic activities;Therapeutic exercise;Patient/family education;Manual techniques;Vasopneumatic Device;Passive range of motion    PT Next Visit Plan  Start RC repair protocol, PROM        Patient will benefit from skilled therapeutic intervention in order to improve  the following deficits and impairments:  Increased edema, Decreased activity tolerance, Decreased strength, Impaired UE functional use, Pain, Increased muscle spasms, Decreased range of motion, Postural dysfunction  Visit Diagnosis: Acute pain of right shoulder  Stiffness of right shoulder, not elsewhere classified  Localized edema     Problem List Patient Active Problem List   Diagnosis Date Noted  . Osteoporosis 12/25/2017  . Coronary artery disease   . Dysuria 02/17/2017  . Diarrhea 02/17/2017  . Vitamin D deficiency 11/07/2016  . Acute on chronic diastolic CHF (congestive heart failure), NYHA class 1 ([place]) 11/18/2015  . Diabetes mellitus without complication ([place]) 75/44/9201  . Reactive airway disease with acute exacerbation 09/10/2014  . Nonspecific (abnormal) findings on radiological and other examination of gastrointestinal tract 04/04/2012  . Insomnia, idiopathic 10/04/2010  . Hyperlipidemia 09/22/2009  . Essential hypertension 09/22/2009  . CAD, NATIVE VESSEL 09/22/2009  . EDEMA 05/19/2009  . Hypothyroidism 02/23/2009  . EXTRINSIC ASTHMA, UNSPECIFIED 02/10/2009  . KERATOSIS, SEBORRHEIC [place] 04/16/2007  . SKIN CANCER, HX OF 02/14/2007  . Depression 01/24/2007  . MYOCARDIAL INFARCTION, HX OF 01/24/2007  . GERD 01/24/2007  . Osteoarthritis 01/24/2007  . COLONIC POLYPS, HX OF 01/24/2007  . IRRITABLE BOWEL SYNDROME, HX OF 01/24/2007    Beverlee Wilmarth,ANGIE PTA 03/08/2018, 11:37 AM  [address] [place], [place], [address] Phone: [phone]   Fax:  [phone]  Name: Rebecca [name] MRN: [id number] Date of Birth: Jul 25, 1945

## 2018-03-12 ENCOUNTER — Encounter: Payer: Self-pay | Admitting: Physical Therapy

## 2018-03-12 ENCOUNTER — Ambulatory Visit: Payer: Medicare Other | Admitting: Physical Therapy

## 2018-03-12 DIAGNOSIS — M25511 Pain in right shoulder: Secondary | ICD-10-CM

## 2018-03-12 DIAGNOSIS — M25611 Stiffness of right shoulder, not elsewhere classified: Secondary | ICD-10-CM

## 2018-03-12 DIAGNOSIS — R600 Localized edema: Secondary | ICD-10-CM

## 2018-03-12 NOTE — Therapy (Signed)
[address] [place], [place], [address] Phone: [phone]   Fax:  [phone]  Physical Therapy Treatment  Patient Details  Name: Rebecca [name] MRN: [id number] Date of Birth: [birth year] Referring Provider: Jomarie Longs   Encounter Date: 03/12/2018  PT End of Session - 03/12/18 1350    Visit Number  3    Date for PT Re-Evaluation  05/06/18    PT Start Time  1310    PT Stop Time  1405    PT Time Calculation (min)  55 min       Past Medical History:  Diagnosis Date  . Asthma    extrinsic  . CAD (coronary artery disease)   . Chest pain, atypical   . Diabetes ([place])   . Edema   . Fatigue   . Fatigue   . GERD (gastroesophageal reflux disease)   . Hyperlipidemia   . Hypertension    familiar  . Hypothyroidism   . IBS (irritable bowel syndrome)    hx  . Keratosis, seborrheic   . Labyrinthitis   . MI (myocardial infarction) ([place])    age 83  . Osteoarthritis   . Skin cancer    Melanoma     Past Surgical History:  Procedure Laterality Date  . Breast Biopsy     dense breast tissue  . BREAST EXCISIONAL BIOPSY Left 2000   benign  . CHOLECYSTECTOMY    . EUS  04/04/2012   Procedure: UPPER ENDOSCOPIC ULTRASOUND (EUS) LINEAR;  Surgeon: Milus Banister, MD;  [facility];  Service: Endoscopy;  Laterality: N/A;  radial linear  . KNEE ARTHROPLASTY     total right  02-28-2010 Dr Pilar Plate Aluisio, left  . KNEE ARTHROPLASTY Bilateral 09/12/2017   Hector Shade  . LEFT HEART CATH AND CORONARY ANGIOGRAPHY N/A 04/20/2017   Procedure: LEFT HEART CATH AND CORONARY ANGIOGRAPHY;  Surgeon: Burnell Blanks, MD;  Location: [facility];  Service: Cardiovascular;  Laterality: N/A;  . TUBAL LIGATION      There were no vitals filed for this visit.  Subjective Assessment - 03/12/18 1313    Subjective  sore like I excpected after last sesssion. distal ulnar nerve still an issue    Currently in  Pain?  Yes    Pain Score  5     Pain Location  Shoulder    Pain Orientation  Right                       [facility] Adult PT Treatment/Exercise - 03/12/18 0001      Exercises   Exercises  Shoulder      Shoulder Exercises: Standing   Other Standing Exercises  pulleys flex,abd and rotation 15 each - PTA assist   ball rolling on mat for ROM   Other Standing Exercises  pendulem ex      Modalities   Modalities  Vasopneumatic      Vasopneumatic   Number Minutes Vasopneumatic   15 minutes    Vasopnuematic Location   Shoulder    Vasopneumatic Pressure  Medium    Vasopneumatic Temperature   38      Manual Therapy   Manual Therapy  Passive ROM    Soft tissue mobilization  gentle STW and distraction     Passive ROM  RT shld supine all directions             PT Education - 03/12/18 1350  Education provided  Yes    Education Details  pendulum ex    Person(s) Educated  Patient    Methods  Explanation;Demonstration    Comprehension  Verbalized understanding;Returned demonstration       PT Short Term Goals - 03/06/18 1655      PT SHORT TERM GOAL #1   Title  independent with initial HEP    Time  2    Period  Weeks    Status  New        PT Long Term Goals - 03/06/18 1656      PT LONG TERM GOAL #1   Title  decrease pain 50% for ADLs    Time  8    Period  Weeks    Status  New      PT LONG TERM GOAL #2   Title  increase AROM of the right shoulder to 140 degrees flexion    Time  8    Period  Weeks    Status  New      PT LONG TERM GOAL #3   Title  increase AROM of right shoulder ER to 70 degrees    Time  8    Period  Weeks    Status  New      PT LONG TERM GOAL #4   Title  reports being able to do hair and dress without pain or limitation    Time  8    Period  Weeks    Status  New      PT LONG TERM GOAL #5   Title  put 3# on head high shelf    Time  8    Period  Weeks    Status  New            Plan - 03/12/18 1351    Clinical  Impression Statement  started pendulum ex and gentle PROM with pulleys and bal rolling , bith passive with assistance and toelrated well. cuing to relax    PT Treatment/Interventions  Cryotherapy;Electrical Stimulation;Iontophoresis 4mg /ml Dexamethasone;Moist Heat;Therapeutic activities;Therapeutic exercise;Patient/family education;Manual techniques;Vasopneumatic Device;Passive range of motion    PT Next Visit Plan  Start RC repair protocol, PROM        Patient will benefit from skilled therapeutic intervention in order to improve the following deficits and impairments:  Increased edema, Decreased activity tolerance, Decreased strength, Impaired UE functional use, Pain, Increased muscle spasms, Decreased range of motion, Postural dysfunction  Visit Diagnosis: Acute pain of right shoulder  Stiffness of right shoulder, not elsewhere classified  Localized edema     Problem List Patient Active Problem List   Diagnosis Date Noted  . Osteoporosis 12/25/2017  . Coronary artery disease   . Dysuria 02/17/2017  . Diarrhea 02/17/2017  . Vitamin D deficiency 11/07/2016  . Acute on chronic diastolic CHF (congestive heart failure), NYHA class 1 ([place]) 11/18/2015  . Diabetes mellitus without complication ([place]) 01/75/1025  . Reactive airway disease with acute exacerbation 09/10/2014  . Nonspecific (abnormal) findings on radiological and other examination of gastrointestinal tract 04/04/2012  . Insomnia, idiopathic 10/04/2010  . Hyperlipidemia 09/22/2009  . Essential hypertension 09/22/2009  . CAD, NATIVE VESSEL 09/22/2009  . EDEMA 05/19/2009  . Hypothyroidism 02/23/2009  . EXTRINSIC ASTHMA, UNSPECIFIED 02/10/2009  . KERATOSIS, SEBORRHEIC [place] 04/16/2007  . SKIN CANCER, HX OF 02/14/2007  . Depression 01/24/2007  . MYOCARDIAL INFARCTION, HX OF 01/24/2007  . GERD 01/24/2007  . Osteoarthritis 01/24/2007  . COLONIC POLYPS, HX OF 01/24/2007  . IRRITABLE  BOWEL SYNDROME, HX OF 01/24/2007     Deondrick Searls,ANGIE PTA 03/12/2018, 1:52 PM  [address] [place], [place], [address] Phone: [phone]   Fax:  [phone]  Name: NAEVIA UNTERREINER MRN: [id number] Date of Birth: 1946-06-14

## 2018-03-14 ENCOUNTER — Encounter: Payer: Self-pay | Admitting: Family Medicine

## 2018-03-14 ENCOUNTER — Ambulatory Visit (INDEPENDENT_AMBULATORY_CARE_PROVIDER_SITE_OTHER): Payer: Medicare Other | Admitting: Family Medicine

## 2018-03-14 ENCOUNTER — Ambulatory Visit: Payer: Medicare Other | Admitting: Physical Therapy

## 2018-03-14 VITALS — BP 120/80 | HR 66 | Temp 98.1°F | Ht <= 58 in

## 2018-03-14 DIAGNOSIS — R059 Cough, unspecified: Secondary | ICD-10-CM

## 2018-03-14 DIAGNOSIS — K219 Gastro-esophageal reflux disease without esophagitis: Secondary | ICD-10-CM | POA: Diagnosis not present

## 2018-03-14 DIAGNOSIS — J181 Lobar pneumonia, unspecified organism: Principal | ICD-10-CM

## 2018-03-14 DIAGNOSIS — R05 Cough: Secondary | ICD-10-CM

## 2018-03-14 DIAGNOSIS — J989 Respiratory disorder, unspecified: Secondary | ICD-10-CM | POA: Diagnosis not present

## 2018-03-14 DIAGNOSIS — J40 Bronchitis, not specified as acute or chronic: Secondary | ICD-10-CM | POA: Diagnosis not present

## 2018-03-14 DIAGNOSIS — I2510 Atherosclerotic heart disease of native coronary artery without angina pectoris: Secondary | ICD-10-CM

## 2018-03-14 DIAGNOSIS — J189 Pneumonia, unspecified organism: Secondary | ICD-10-CM

## 2018-03-14 DIAGNOSIS — J4521 Mild intermittent asthma with (acute) exacerbation: Secondary | ICD-10-CM | POA: Diagnosis not present

## 2018-03-14 MED ORDER — BENZONATATE 100 MG PO CAPS: 100.0000 mg | ORAL_CAPSULE | Freq: Two times a day (BID) | ORAL | 0 refills | Status: DC | PRN

## 2018-03-14 MED ORDER — FLUTICASONE PROPIONATE HFA 44 MCG/ACT IN AERO: 2.0000 | INHALATION_SPRAY | Freq: Two times a day (BID) | RESPIRATORY_TRACT | 0 refills | Status: DC

## 2018-03-14 NOTE — Patient Instructions (Addendum)
BEFORE YOU LEAVE: -please let her know it looks like there were already refills on the albuterol - let us know if any issue with refill -follow up: in about 5-7 days  Start the flovent 2 puffs twice daily  Continue tessalon and albuterol as needed  See if improves with stopping ibuprofen for a few days  Could try afrin for a few days - don't use longer then 3 days  I hope you are feeling better soon! Seek care sooner if your symptoms worsen, new concerns arise or you are not improving with treatment.

## 2018-03-14 NOTE — Progress Notes (Signed)
HPI:  Using dictation device. Unfortunately this device frequently misinterprets words/phrases.   Acute visit for respiratory illness: -started: 8/18 (11 days ago) with cough, rhinorrhea, headaches, sore throat - saw Dr. Volanda Napoleon (given prn alb and zpack); incidentally took cephalosporin 4 days for R shoulder procedure, husband similar symptoms -hx RAD -symptoms today: Persistent cough, she recently had shoulder surgery and coughing aggravates the shoulder, mucus has become thinner and clear -denies:fever, SOB, NVD, tooth pain, wheezing, hemoptysis -has tried: Took the Z-Pak, about to finish it, using albuterol as needed which helps and Tessalon which helps, requests refill on the Tessalon -sick contacts/travel/risks: no reported flu, strep or tick exposure -Hx of: Reactive airway disease, reports this is happened in the past required prednisone, but she does not want to take an oral steroid if possible.   ROS: See pertinent positives and negatives per HPI.  Past Medical History:  Diagnosis Date  . Asthma    extrinsic  . CAD (coronary artery disease)   . Chest pain, atypical   . Diabetes ([place])   . Edema   . Fatigue   . Fatigue   . GERD (gastroesophageal reflux disease)   . Hyperlipidemia   . Hypertension    familiar  . Hypothyroidism   . IBS (irritable bowel syndrome)    hx  . Keratosis, seborrheic   . Labyrinthitis   . MI (myocardial infarction) ([place])    age 22  . Osteoarthritis   . Skin cancer    Melanoma     Past Surgical History:  Procedure Laterality Date  . Breast Biopsy     dense breast tissue  . BREAST EXCISIONAL BIOPSY Left 2000   benign  . CHOLECYSTECTOMY    . EUS  04/04/2012   Procedure: UPPER ENDOSCOPIC ULTRASOUND (EUS) LINEAR;  Surgeon: Milus Banister, MD;  Location: [facility];  Service: Endoscopy;  Laterality: N/A;  radial linear  . KNEE ARTHROPLASTY     total right  02-28-2010 Dr Pilar Plate Aluisio, left  . KNEE ARTHROPLASTY Bilateral 09/12/2017   Hector Shade  . LEFT HEART CATH AND CORONARY ANGIOGRAPHY N/A 04/20/2017   Procedure: LEFT HEART CATH AND CORONARY ANGIOGRAPHY;  Surgeon: Burnell Blanks, MD;  Location: [facility];  Service: Cardiovascular;  Laterality: N/A;  . TUBAL LIGATION      Family History  Problem Relation Age of Onset  . Heart disease Mother   . Hypertension Mother   . Heart attack Mother 6       MULTIPLES OVER TIME  . Melanoma Daughter 30       METASTATIC  . Breast cancer Sister   . Hypertension Father   . Hypertension Sister     Social History   Socioeconomic History  . Marital status: Married    Spouse name: Not on file  . Number of children: 2  . Years of education: Not on file  . Highest education level: Not on file  Occupational History  . Occupation: RN-retired    Employer: RETIRED  Social Needs  . Financial resource strain: Not on file  . Food insecurity:    Worry: Not on file    Inability: Not on file  . Transportation needs:    Medical: Not on file    Non-medical: Not on file  Tobacco Use  . Smoking status: Never Smoker  . Smokeless tobacco: Never Used  Substance and Sexual Activity  . Alcohol use: No    Comment: rarely  . Drug use: No  .  Sexual activity: Not on file  Lifestyle  . Physical activity:    Days per week: Not on file    Minutes per session: Not on file  . Stress: Not on file  Relationships  . Social connections:    Talks on phone: Not on file    Gets together: Not on file    Attends religious service: Not on file    Active member of club or organization: Not on file    Attends meetings of clubs or organizations: Not on file    Relationship status: Not on file  Other Topics Concern  . Not on file  Social History Narrative   ** Merged History Encounter **         Current Outpatient Medications:  .  ACCU-CHEK FASTCLIX LANCETS MISC, USE   TO CHECK GLUCOSE TWICE DAILY, Disp: 102 each, Rfl: 5 .  ACCU-CHEK GUIDE test strip, Use to test blood  glucose twice daily, Disp: 200 each, Rfl: 3 .  acetaminophen (TYLENOL) 325 MG tablet, Take 650 mg by mouth every 6 (six) hours as needed.  , Disp: , Rfl:  .  albuterol (PROVENTIL HFA;VENTOLIN HFA) 108 (90 Base) MCG/ACT inhaler, Inhale 2 puffs into the lungs every 6 (six) hours as needed for wheezing or shortness of breath., Disp: 1 Inhaler, Rfl: 2 .  aspirin EC 81 MG tablet, Take 1 tablet (81 mg total) by mouth daily., Disp: 90 tablet, Rfl: 3 .  atorvastatin (LIPITOR) 80 MG tablet, Take 1 tablet (80 mg total) by mouth daily., Disp: 90 tablet, Rfl: 3 .  azithromycin (ZITHROMAX) 250 MG tablet, Take 2 pills on day 1.  Then take 1 pill daily on days 2 through 5., Disp: 6 tablet, Rfl: 0 .  benzonatate (TESSALON) 100 MG capsule, Take 1 capsule (100 mg total) by mouth 2 (two) times daily as needed for cough., Disp: 20 capsule, Rfl: 0 .  calcium carbonate (OS-CAL) 600 MG TABS, Take 1,200 mg by mouth daily. , Disp: , Rfl:  .  canagliflozin (INVOKANA) 100 MG TABS tablet, TAKE 1 TABLET BY MOUTH  DAILY BEFORE BREAKFAST, Disp: 90 tablet, Rfl: 1 .  Cholecalciferol (VITAMIN D) 2000 UNITS CAPS, Take 1 capsule by mouth daily.  , Disp: , Rfl:  .  conjugated estrogens (PREMARIN) vaginal cream, Place 0.5 Applicatorfuls vaginally every 7 (seven) days., Disp: 42.5 g, Rfl: 6 .  diltiazem (CARDIZEM CD) 180 MG 24 hr capsule, Take 1 capsule (180 mg total) by mouth daily., Disp: 90 capsule, Rfl: 3 .  furosemide (LASIX) 20 MG tablet, Take 1 tablet (20 mg total) by mouth 2 (two) times daily., Disp: 180 tablet, Rfl: 3 .  levothyroxine (SYNTHROID, LEVOTHROID) 50 MCG tablet, TAKE 1 TABLET BY MOUTH  DAILY BEFORE BREAKFAST, Disp: 90 tablet, Rfl: 3 .  Naphazoline-Polyethyl Glycol (EYE DROPS) 0.012-0.2 % SOLN, Apply to eye as needed. Artifical eye drops, Disp: , Rfl:  .  nitroGLYCERIN (NITROSTAT) 0.4 MG SL tablet, DISSOLVE 1 TABLET UNDER THE TONGUE EVERY 5 MINUTES AS  NEEDED FOR CHEST PAIN. MAX  3 DOSES IN 15 MINUTES. CALL 911 IF CHEST  PAIN PERSISTS, Disp: 100 tablet, Rfl: 6 .  Omega-3 Fatty Acids (FISH OIL) 1200 MG CAPS, Take by mouth.  , Disp: , Rfl:  .  OVER THE COUNTER MEDICATION, CBD OIL - SHOULDER PAIN, Disp: , Rfl:  .  potassium chloride SA (K-DUR,KLOR-CON) 20 MEQ tablet, Take 1 tablet (20 mEq total) by mouth 2 (two) times daily., Disp: 180 tablet, Rfl: 3 .  traZODone (DESYREL) 50 MG tablet, Take 0.5-1 tablets (25-50 mg total) by mouth at bedtime as needed for sleep., Disp: 90 tablet, Rfl: 1 .  fluticasone (FLOVENT HFA) 44 MCG/ACT inhaler, Inhale 2 puffs into the lungs 2 (two) times daily., Disp: 1 Inhaler, Rfl: 0  EXAM:  Vitals:   03/14/18 1042  BP: 120/80  Pulse: 66  Temp: 98.1 F (36.7 C)  SpO2: 97%    Body mass index is 29.68 kg/m.  GENERAL: vitals reviewed and listed above, alert, oriented, appears well hydrated and in no acute distress  HEENT: atraumatic, conjunttiva clear, no obvious abnormalities on inspection of external nose and ears, normal appearance of ear canals and TMs, clear nasal congestion, mild post oropharyngeal erythema with PND, no tonsillar edema or exudate, no sinus TTP  NECK: no obvious masses on inspection  LUNGS: clear to auscultation bilaterally, no wheezes, rales or rhonchi, good air movement  CV: HRRR, no peripheral edema  MS: moves all extremities without noticeable abnormality  PSYCH: pleasant and cooperative, no obvious depression or anxiety  ASSESSMENT AND PLAN:  Discussed the following assessment and plan:  Cough  Respiratory illness  Bronchitis - Plan: benzonatate (TESSALON) 100 MG capsule  Gastroesophageal reflux disease, esophagitis presence not specified  Mild intermittent reactive airway disease with acute exacerbation  -We discussed potential etiologies, with VURI aggravating RAD being most likely.  She also has a history of acid reflux and had some issues with this that were mild on all the medications, so she has started an acid reducer.  She has  some postnasal drip on exam, this could be contributing as well.  We discussed treatment side effects, likely course, antibiotic misuse, transmission, and signs of developing a serious illness. -She has taken a cephalosporin and Z-Pak, and this does not seem bacterial from her symptoms, did offer chest x-ray today, but she declined -She does not want to take an oral steroid, so we will try an inhaled steroid for a few weeks along with as needed albuterol and a refill on the Tessalon since this seems to help -NSAIDs can sometimes aggravate this in some people, so she will try a few days off of this -Also continue the acid reducer and could try Afrin for a few days for postnasal drip to see if this helps, did discuss risks and contraindications and proper use, and to only use short-term -Since she is having ongoing issues and opted not to do a chest x-ray today, did advise a follow-up next week to recheck -of course, we advised to return or notify a doctor sooner  if symptoms worsen  or new concerns arise.    Patient Instructions  BEFORE YOU LEAVE: -please let her know it looks like there were already refills on the albuterol - let us know if any issue with refill -follow up: in about 5-7 days  Start the flovent 2 puffs twice daily  Continue tessalon and albuterol as needed  See if improves with stopping ibuprofen for a few days  Could try afrin for a few days - don't use longer then 3 days  I hope you are feeling better soon! Seek care sooner if your symptoms worsen, new concerns arise or you are not improving with treatment.      Rebecca Kern, DO

## 2018-03-14 NOTE — Addendum Note (Signed)
Addended by: Agnes Lawrence on: 03/14/2018 11:29 AM   Modules accepted: Orders

## 2018-03-19 ENCOUNTER — Encounter: Payer: Self-pay | Admitting: Family Medicine

## 2018-03-19 ENCOUNTER — Ambulatory Visit: Payer: Medicare Other | Attending: Orthopedic Surgery | Admitting: Physical Therapy

## 2018-03-19 DIAGNOSIS — M25511 Pain in right shoulder: Secondary | ICD-10-CM | POA: Diagnosis not present

## 2018-03-19 DIAGNOSIS — R600 Localized edema: Secondary | ICD-10-CM | POA: Diagnosis not present

## 2018-03-19 DIAGNOSIS — M25611 Stiffness of right shoulder, not elsewhere classified: Secondary | ICD-10-CM | POA: Diagnosis not present

## 2018-03-19 NOTE — Therapy (Signed)
[place], [place], [address] Phone: [phone]   Fax:  [phone]  Physical Therapy Treatment  Patient Details  Name: Rebecca [name] MRN: [id number] Date of Birth: March 22, 1946 Referring Provider: Jomarie Longs   Encounter Date: 03/19/2018  PT End of Session - 03/19/18 1439    Visit Number  4    Date for PT Re-Evaluation  05/06/18    PT Start Time  1400    PT Stop Time  1450    PT Time Calculation (min)  50 min       Past Medical History:  Diagnosis Date  . Asthma    extrinsic  . CAD (coronary artery disease)   . Chest pain, atypical   . Diabetes ([place])   . Edema   . Fatigue   . Fatigue   . GERD (gastroesophageal reflux disease)   . Hyperlipidemia   . Hypertension    familiar  . Hypothyroidism   . IBS (irritable bowel syndrome)    hx  . Keratosis, seborrheic   . Labyrinthitis   . MI (myocardial infarction) ([place])    age 48  . Osteoarthritis   . S/P arthroscopy of right shoulder 03/01/2018  . Skin cancer    Melanoma     Past Surgical History:  Procedure Laterality Date  . Breast Biopsy     dense breast tissue  . BREAST EXCISIONAL BIOPSY Left 2000   benign  . CHOLECYSTECTOMY    . EUS  04/04/2012   Procedure: UPPER ENDOSCOPIC ULTRASOUND (EUS) LINEAR;  Surgeon: Milus Banister, MD;  Location: [facility];  Service: Endoscopy;  Laterality: N/A;  radial linear  . KNEE ARTHROPLASTY     total right  02-28-2010 Dr Pilar Plate Aluisio, left  . KNEE ARTHROPLASTY Bilateral 09/12/2017   Hector Shade  . LEFT HEART CATH AND CORONARY ANGIOGRAPHY N/A 04/20/2017   Procedure: LEFT HEART CATH AND CORONARY ANGIOGRAPHY;  Surgeon: Burnell Blanks, MD;  Location: [facility];  Service: Cardiovascular;  Laterality: N/A;  . TUBAL LIGATION      There were no vitals filed for this visit.  Subjective Assessment - 03/19/18 1434    Subjective  I hated to cancel last week but I was so sick     Currently in Pain?  Yes    Pain Score  4     Pain Location  Shoulder    Pain Orientation  Right                       [facility] Adult PT Treatment/Exercise - 03/19/18 0001      Shoulder Exercises: Standing   Other Standing Exercises  pulleys flex,abd and IR/ER rotation 15 each - PTA assist      Vasopneumatic   Number Minutes Vasopneumatic   15 minutes    Vasopnuematic Location   Shoulder    Vasopneumatic Pressure  Medium    Vasopneumatic Temperature   38      Manual Therapy   Manual Therapy  Passive ROM    Soft tissue mobilization  gentle STW and distraction     Passive ROM  RT shld supine all directions               PT Short Term Goals - 03/06/18 1655      PT SHORT TERM GOAL #1   Title  independent with initial HEP    Time  2  Period  Weeks    Status  New        PT Long Term Goals - 03/06/18 1656      PT LONG TERM GOAL #1   Title  decrease pain 50% for ADLs    Time  8    Period  Weeks    Status  New      PT LONG TERM GOAL #2   Title  increase AROM of the right shoulder to 140 degrees flexion    Time  8    Period  Weeks    Status  New      PT LONG TERM GOAL #3   Title  increase AROM of right shoulder ER to 70 degrees    Time  8    Period  Weeks    Status  New      PT LONG TERM GOAL #4   Title  reports being able to do hair and dress without pain or limitation    Time  8    Period  Weeks    Status  New      PT LONG TERM GOAL #5   Title  put 3# on head high shelf    Time  8    Period  Weeks    Status  New            Plan - 03/19/18 1439    Clinical Impression Statement  verb and tactile cuing to relax with PROM and cautioned not to use UE. following with PROM for 6 weeks, week 3/6    PT Treatment/Interventions  Cryotherapy;Electrical Stimulation;Iontophoresis 4mg /ml Dexamethasone;Moist Heat;Therapeutic activities;Therapeutic exercise;Patient/family education;Manual techniques;Vasopneumatic Device;Passive range of  motion    PT Next Visit Plan  Start RC repair protocol, PROM check ROM and goals       Patient will benefit from skilled therapeutic intervention in order to improve the following deficits and impairments:  Increased edema, Decreased activity tolerance, Decreased strength, Impaired UE functional use, Pain, Increased muscle spasms, Decreased range of motion, Postural dysfunction  Visit Diagnosis: Acute pain of right shoulder  Stiffness of right shoulder, not elsewhere classified  Localized edema     Problem List Patient Active Problem List   Diagnosis Date Noted  . Osteoporosis 12/25/2017  . Coronary artery disease   . Dysuria 02/17/2017  . Diarrhea 02/17/2017  . Vitamin D deficiency 11/07/2016  . Acute on chronic diastolic CHF (congestive heart failure), NYHA class 1 ([place]) 11/18/2015  . Diabetes mellitus without complication ([place]) 85/27/7824  . Reactive airway disease with acute exacerbation 09/10/2014  . Nonspecific (abnormal) findings on radiological and other examination of gastrointestinal tract 04/04/2012  . Insomnia, idiopathic 10/04/2010  . Hyperlipidemia 09/22/2009  . Essential hypertension 09/22/2009  . CAD, NATIVE VESSEL 09/22/2009  . EDEMA 05/19/2009  . Hypothyroidism 02/23/2009  . EXTRINSIC ASTHMA, UNSPECIFIED 02/10/2009  . KERATOSIS, SEBORRHEIC [place] 04/16/2007  . SKIN CANCER, HX OF 02/14/2007  . Depression 01/24/2007  . MYOCARDIAL INFARCTION, HX OF 01/24/2007  . GERD 01/24/2007  . Osteoarthritis 01/24/2007  . COLONIC POLYPS, HX OF 01/24/2007  . IRRITABLE BOWEL SYNDROME, HX OF 01/24/2007    PAYSEUR,ANGIE PTA 03/19/2018, 2:42 PM  [place] [address] [place], [place], [address] Phone: [phone]   Fax:  [phone]  Name: Rebecca [name] MRN: [id number] Date of Birth: May 16, 1946

## 2018-03-21 ENCOUNTER — Encounter: Payer: Self-pay | Admitting: Adult Health

## 2018-03-21 ENCOUNTER — Ambulatory Visit (INDEPENDENT_AMBULATORY_CARE_PROVIDER_SITE_OTHER): Payer: Medicare Other | Admitting: Adult Health

## 2018-03-21 ENCOUNTER — Ambulatory Visit (INDEPENDENT_AMBULATORY_CARE_PROVIDER_SITE_OTHER): Payer: Medicare Other

## 2018-03-21 VITALS — BP 140/66 | Wt 141.0 lb

## 2018-03-21 DIAGNOSIS — I2510 Atherosclerotic heart disease of native coronary artery without angina pectoris: Secondary | ICD-10-CM | POA: Diagnosis not present

## 2018-03-21 DIAGNOSIS — J069 Acute upper respiratory infection, unspecified: Secondary | ICD-10-CM

## 2018-03-21 DIAGNOSIS — B9789 Other viral agents as the cause of diseases classified elsewhere: Secondary | ICD-10-CM

## 2018-03-21 DIAGNOSIS — R05 Cough: Secondary | ICD-10-CM | POA: Diagnosis not present

## 2018-03-21 MED ORDER — DOXYCYCLINE HYCLATE 100 MG PO CAPS: 100.0000 mg | ORAL_CAPSULE | Freq: Two times a day (BID) | ORAL | 0 refills | Status: DC

## 2018-03-21 MED ORDER — BENZONATATE 200 MG PO CAPS: 200.0000 mg | ORAL_CAPSULE | Freq: Three times a day (TID) | ORAL | 1 refills | Status: DC | PRN

## 2018-03-21 MED ORDER — HYDROCODONE-HOMATROPINE 5-1.5 MG/5ML PO SYRP: 5.0000 mL | ORAL_SOLUTION | Freq: Three times a day (TID) | ORAL | 0 refills | Status: DC | PRN

## 2018-03-21 NOTE — Telephone Encounter (Signed)
Updated patient on her recent chest x-ray, chest x-ray favors pneumonia.  Will send in doxycycline course

## 2018-03-21 NOTE — Progress Notes (Signed)
Subjective:    Patient ID: Rebecca [name], female    DOB: March 25, 1946, 72 y.o.   MRN: [id number]  HPI 72 year old female who  has a past medical history of Asthma, CAD (coronary artery disease), Chest pain, atypical, Diabetes ([place]), Edema, Fatigue, Fatigue, GERD (gastroesophageal reflux disease), Hyperlipidemia, Hypertension, Hypothyroidism, IBS (irritable bowel syndrome), Keratosis, seborrheic, Labyrinthitis, MI (myocardial infarction) ([place]), Osteoarthritis, S/P arthroscopy of right shoulder (03/01/2018), and Skin cancer.  Acute visit for URI like symptoms. Started on 03/03/2018 with cough, rhinorrhea, headaches and sore throat. She was seen by Dr. Volanda Napoleon on 03/07/2018 and given PRN alb and z pack. She had finished the z pack and was using alb PRN.   She was then seen by Dr. Maudie Mercury on 8/29 as she continued to have a persistent cough, and recently had shoulder surgery - the cough was aggravating the shoulder. As this time mucus had become thinner and clear. Chest xray was offered but refused. Tessalon pearls were renewed.   Today she presents with continued productive cough, thin and clear, rhinorrhea, and fatigue. Reports that she is starting to feel better.   Has history of reactive airway disease.     Review of Systems See HPI   Past Medical History:  Diagnosis Date  . Asthma    extrinsic  . CAD (coronary artery disease)   . Chest pain, atypical   . Diabetes ([place])   . Edema   . Fatigue   . Fatigue   . GERD (gastroesophageal reflux disease)   . Hyperlipidemia   . Hypertension    familiar  . Hypothyroidism   . IBS (irritable bowel syndrome)    hx  . Keratosis, seborrheic   . Labyrinthitis   . MI (myocardial infarction) ([place])    age 35  . Osteoarthritis   . S/P arthroscopy of right shoulder 03/01/2018  . Skin cancer    Melanoma     Social History   Socioeconomic History  . Marital status: Married    Spouse name: Not on file  . Number of children: 2  . Years of  education: Not on file  . Highest education level: Not on file  Occupational History  . Occupation: RN-retired    Employer: RETIRED  Social Needs  . Financial resource strain: Not on file  . Food insecurity:    Worry: Not on file    Inability: Not on file  . Transportation needs:    Medical: Not on file    Non-medical: Not on file  Tobacco Use  . Smoking status: Never Smoker  . Smokeless tobacco: Never Used  Substance and Sexual Activity  . Alcohol use: No    Comment: rarely  . Drug use: No  . Sexual activity: Not on file  Lifestyle  . Physical activity:    Days per week: Not on file    Minutes per session: Not on file  . Stress: Not on file  Relationships  . Social connections:    Talks on phone: Not on file    Gets together: Not on file    Attends religious service: Not on file    Active member of club or organization: Not on file    Attends meetings of clubs or organizations: Not on file    Relationship status: Not on file  . Intimate partner violence:    Fear of current or ex partner: Not on file    Emotionally abused: Not on file    Physically abused: Not on  file    Forced sexual activity: Not on file  Other Topics Concern  . Not on file  Social History Narrative   ** Merged History Encounter **        Past Surgical History:  Procedure Laterality Date  . Breast Biopsy     dense breast tissue  . BREAST EXCISIONAL BIOPSY Left 2000   benign  . CHOLECYSTECTOMY    . EUS  04/04/2012   Procedure: UPPER ENDOSCOPIC ULTRASOUND (EUS) LINEAR;  Surgeon: Milus Banister, MD;  [facility];  Service: Endoscopy;  Laterality: N/A;  radial linear  . KNEE ARTHROPLASTY     total right  02-28-2010 Dr Pilar Plate Aluisio, left  . KNEE ARTHROPLASTY Bilateral 09/12/2017   Hector Shade  . LEFT HEART CATH AND CORONARY ANGIOGRAPHY N/A 04/20/2017   Procedure: LEFT HEART CATH AND CORONARY ANGIOGRAPHY;  Surgeon: Burnell Blanks, MD;  Location: [facility];  Service:  Cardiovascular;  Laterality: N/A;  . TUBAL LIGATION      Family History  Problem Relation Age of Onset  . Heart disease Mother   . Hypertension Mother   . Heart attack Mother 70       MULTIPLES OVER TIME  . Melanoma Daughter 66       METASTATIC  . Breast cancer Sister   . Hypertension Father   . Hypertension Sister     Allergies  Allergen Reactions  . Sulfa Antibiotics   . Sulfonamide Derivatives     REACTION: Kathreen Cosier syndrome    Current Outpatient Medications on File Prior to Visit  Medication Sig Dispense Refill  . ACCU-CHEK FASTCLIX LANCETS MISC USE   TO CHECK GLUCOSE TWICE DAILY 102 each 5  . ACCU-CHEK GUIDE test strip Use to test blood glucose twice daily 200 each 3  . acetaminophen (TYLENOL) 325 MG tablet Take 650 mg by mouth every 6 (six) hours as needed.      Marland Kitchen albuterol (PROVENTIL HFA;VENTOLIN HFA) 108 (90 Base) MCG/ACT inhaler Inhale 2 puffs into the lungs every 6 (six) hours as needed for wheezing or shortness of breath. 1 Inhaler 2  . aspirin EC 81 MG tablet Take 1 tablet (81 mg total) by mouth daily. 90 tablet 3  . atorvastatin (LIPITOR) 80 MG tablet Take 1 tablet (80 mg total) by mouth daily. 90 tablet 3  . calcium carbonate (OS-CAL) 600 MG TABS Take 1,200 mg by mouth daily.     . canagliflozin (INVOKANA) 100 MG TABS tablet TAKE 1 TABLET BY MOUTH  DAILY BEFORE BREAKFAST 90 tablet 1  . Cholecalciferol (VITAMIN D) 2000 UNITS CAPS Take 1 capsule by mouth daily.      Marland Kitchen conjugated estrogens (PREMARIN) vaginal cream Place 0.5 Applicatorfuls vaginally every 7 (seven) days. 42.5 g 6  . diltiazem (CARDIZEM CD) 180 MG 24 hr capsule Take 1 capsule (180 mg total) by mouth daily. 90 capsule 3  . fluticasone (FLOVENT HFA) 44 MCG/ACT inhaler Inhale 2 puffs into the lungs 2 (two) times daily. 1 Inhaler 0  . furosemide (LASIX) 20 MG tablet Take 1 tablet (20 mg total) by mouth 2 (two) times daily. 180 tablet 3  . levothyroxine (SYNTHROID, LEVOTHROID) 50 MCG tablet TAKE 1  TABLET BY MOUTH  DAILY BEFORE BREAKFAST 90 tablet 3  . Naphazoline-Polyethyl Glycol (EYE DROPS) 0.012-0.2 % SOLN Apply to eye as needed. Artifical eye drops    . nitroGLYCERIN (NITROSTAT) 0.4 MG SL tablet DISSOLVE 1 TABLET UNDER THE TONGUE EVERY 5 MINUTES AS  NEEDED  FOR CHEST PAIN. MAX  3 DOSES IN 15 MINUTES. CALL 911 IF CHEST PAIN PERSISTS 100 tablet 6  . Omega-3 Fatty Acids (FISH OIL) 1200 MG CAPS Take by mouth.      . potassium chloride SA (K-DUR,KLOR-CON) 20 MEQ tablet Take 1 tablet (20 mEq total) by mouth 2 (two) times daily. 180 tablet 3  . traZODone (DESYREL) 50 MG tablet Take 0.5-1 tablets (25-50 mg total) by mouth at bedtime as needed for sleep. 90 tablet 1  . OVER THE COUNTER MEDICATION CBD OIL - SHOULDER PAIN     No current facility-administered medications on file prior to visit.     BP 140/66   Wt 141 lb (64 kg) Comment: Pt reported  BMI 29.47 kg/m       Objective:   Physical Exam  Constitutional: She is oriented to person, place, and time. She appears well-developed and well-nourished. No distress.  Cardiovascular: Normal rate, regular rhythm, normal heart sounds and intact distal pulses.  Pulmonary/Chest: Effort normal and breath sounds normal. No stridor. No respiratory distress. She has no wheezes. She has no rales. She exhibits no tenderness.  Dry cough in the office    Neurological: She is alert and oriented to person, place, and time.  Skin: Skin is warm and dry. She is not diaphoretic.  Psychiatric: She has a normal mood and affect. Her behavior is normal. Thought content normal.  Nursing note and vitals reviewed.     Assessment & Plan:  1. Viral URI with cough - Wil lget chest xray to r/o PNA. Stronger dosage of tessalon pearls and add Hycodan.  - benzonatate (TESSALON) 200 MG capsule; Take 1 capsule (200 mg total) by mouth 3 (three) times daily as needed for cough.  Dispense: 30 capsule; Refill: 1 - DG Chest 2 View; Future - HYDROcodone-homatropine (HYCODAN)  5-1.5 MG/5ML syrup; Take 5 mLs by mouth every 8 (eight) hours as needed for cough.  Dispense: 120 mL; Refill: 0 - She does not want oral prednisone at this time   [name], NP

## 2018-03-22 ENCOUNTER — Encounter: Payer: Self-pay | Admitting: Physical Therapy

## 2018-03-22 ENCOUNTER — Ambulatory Visit: Payer: Medicare Other | Admitting: Physical Therapy

## 2018-03-22 DIAGNOSIS — R600 Localized edema: Secondary | ICD-10-CM

## 2018-03-22 DIAGNOSIS — M25611 Stiffness of right shoulder, not elsewhere classified: Secondary | ICD-10-CM

## 2018-03-22 DIAGNOSIS — M25511 Pain in right shoulder: Secondary | ICD-10-CM

## 2018-03-22 NOTE — Therapy (Signed)
[address] [place], [place], [address] Phone: [phone]   Fax:  [phone]  Physical Therapy Treatment  Patient Details  Name: Rebecca [name] MRN: [id number] Date of Birth: 1946/06/20 Referring Provider: Jomarie Longs   Encounter Date: 03/22/2018  PT End of Session - 03/22/18 1127    Visit Number  5    Date for PT Re-Evaluation  05/06/18    PT Start Time  1057    PT Stop Time  1140    PT Time Calculation (min)  43 min       Past Medical History:  Diagnosis Date  . Asthma    extrinsic  . CAD (coronary artery disease)   . Chest pain, atypical   . Diabetes ([place])   . Edema   . Fatigue   . Fatigue   . GERD (gastroesophageal reflux disease)   . Hyperlipidemia   . Hypertension    familiar  . Hypothyroidism   . IBS (irritable bowel syndrome)    hx  . Keratosis, seborrheic   . Labyrinthitis   . MI (myocardial infarction) ([place])    age 25  . Osteoarthritis   . S/P arthroscopy of right shoulder 03/01/2018  . Skin cancer    Melanoma     Past Surgical History:  Procedure Laterality Date  . Breast Biopsy     dense breast tissue  . BREAST EXCISIONAL BIOPSY Left 2000   benign  . CHOLECYSTECTOMY    . EUS  04/04/2012   Procedure: UPPER ENDOSCOPIC ULTRASOUND (EUS) LINEAR;  Surgeon: Milus Banister, MD;  [facility];  Service: Endoscopy;  Laterality: N/A;  radial linear  . KNEE ARTHROPLASTY     total right  02-28-2010 Dr Pilar Plate Aluisio, left  . KNEE ARTHROPLASTY Bilateral 09/12/2017   Hector Shade  . LEFT HEART CATH AND CORONARY ANGIOGRAPHY N/A 04/20/2017   Procedure: LEFT HEART CATH AND CORONARY ANGIOGRAPHY;  Surgeon: Burnell Blanks, MD;  Location: [facility];  Service: Cardiovascular;  Laterality: N/A;  . TUBAL LIGATION      There were no vitals filed for this visit.  Subjective Assessment - 03/22/18 1122    Subjective  I have pneomonia- so that is why I have been  feling so bad    Currently in Pain?  Yes    Pain Score  3     Pain Location  Shoulder    Pain Orientation  Right         [facility] PT Assessment - 03/22/18 0001      PROM   PROM Assessment Site  Shoulder    Right/Left Shoulder  Right    Right Shoulder Flexion  175 Degrees    Right Shoulder ABduction  175 Degrees    Right Shoulder Internal Rotation  80 Degrees    Right Shoulder External Rotation  80 Degrees                   [facility] Adult PT Treatment/Exercise - 03/22/18 0001      Modalities   Modalities  Vasopneumatic      Vasopneumatic   Number Minutes Vasopneumatic   15 minutes    Vasopnuematic Location   Shoulder    Vasopneumatic Pressure  Medium    Vasopneumatic Temperature   35      Manual Therapy   Manual Therapy  Passive ROM    Soft tissue mobilization  gentle STW and distraction  Passive ROM  RT shld supine all directions   4 sets 12 all directions              PT Short Term Goals - 03/22/18 1125      PT SHORT TERM GOAL #1   Title  independent with initial HEP    Status  Achieved        PT Long Term Goals - 03/22/18 1126      PT LONG TERM GOAL #2   Title  increase AROM of the right shoulder to 140 degrees flexion    Status  On-going      PT LONG TERM GOAL #3   Title  increase AROM of right shoulder ER to 70 degrees    Status  On-going      PT LONG TERM GOAL #4   Title  reports being able to do hair and dress without pain or limitation    Status  On-going      PT LONG TERM GOAL #5   Title  put 3# on head high shelf    Status  On-going            Plan - 03/22/18 1127    Clinical Impression Statement  pt starts out stiff with PROM but with cuing and reps ROM increases and sh eis doing very well. staying with PROM only as per prot.    PT Treatment/Interventions  Cryotherapy;Electrical Stimulation;Iontophoresis 4mg /ml Dexamethasone;Moist Heat;Therapeutic activities;Therapeutic exercise;Patient/family education;Manual  techniques;Vasopneumatic Device;Passive range of motion    PT Next Visit Plan  write MD note       Patient will benefit from skilled therapeutic intervention in order to improve the following deficits and impairments:  Increased edema, Decreased activity tolerance, Decreased strength, Impaired UE functional use, Pain, Increased muscle spasms, Decreased range of motion, Postural dysfunction  Visit Diagnosis: Acute pain of right shoulder  Stiffness of right shoulder, not elsewhere classified  Localized edema     Problem List Patient Active Problem List   Diagnosis Date Noted  . Osteoporosis 12/25/2017  . Coronary artery disease   . Dysuria 02/17/2017  . Diarrhea 02/17/2017  . Vitamin D deficiency 11/07/2016  . Acute on chronic diastolic CHF (congestive heart failure), NYHA class 1 ([place]) 11/18/2015  . Diabetes mellitus without complication ([place]) 31/51/7616  . Reactive airway disease with acute exacerbation 09/10/2014  . Nonspecific (abnormal) findings on radiological and other examination of gastrointestinal tract 04/04/2012  . Insomnia, idiopathic 10/04/2010  . Hyperlipidemia 09/22/2009  . Essential hypertension 09/22/2009  . CAD, NATIVE VESSEL 09/22/2009  . EDEMA 05/19/2009  . Hypothyroidism 02/23/2009  . EXTRINSIC ASTHMA, UNSPECIFIED 02/10/2009  . KERATOSIS, SEBORRHEIC [place] 04/16/2007  . SKIN CANCER, HX OF 02/14/2007  . Depression 01/24/2007  . MYOCARDIAL INFARCTION, HX OF 01/24/2007  . GERD 01/24/2007  . Osteoarthritis 01/24/2007  . COLONIC POLYPS, HX OF 01/24/2007  . IRRITABLE BOWEL SYNDROME, HX OF 01/24/2007    Lief Palmatier,ANGIE PTA 03/22/2018, 11:28 AM  [address] [place], [place], [address] Phone: [phone]   Fax:  [phone]  Name: Rebecca [name] MRN: [id number] Date of Birth: 17-Sep-1945

## 2018-03-26 ENCOUNTER — Ambulatory Visit: Payer: Medicare Other | Admitting: Physical Therapy

## 2018-03-26 DIAGNOSIS — M25511 Pain in right shoulder: Secondary | ICD-10-CM

## 2018-03-26 DIAGNOSIS — M25611 Stiffness of right shoulder, not elsewhere classified: Secondary | ICD-10-CM | POA: Diagnosis not present

## 2018-03-26 DIAGNOSIS — R600 Localized edema: Secondary | ICD-10-CM

## 2018-03-26 NOTE — Therapy (Signed)
[address] [place], [place], [address] Phone: [phone]   Fax:  [phone]  Physical Therapy Treatment  Patient Details  Name: Rebecca [name] MRN: [id number] Date of Birth: 10-17-1945 Referring Provider: Jomarie Longs   Encounter Date: 03/26/2018  PT End of Session - 03/26/18 1252    Visit Number  6    PT Start Time  3235    PT Stop Time  1310    PT Time Calculation (min)  46 min       Past Medical History:  Diagnosis Date  . Asthma    extrinsic  . CAD (coronary artery disease)   . Chest pain, atypical   . Diabetes ([place])   . Edema   . Fatigue   . Fatigue   . GERD (gastroesophageal reflux disease)   . Hyperlipidemia   . Hypertension    familiar  . Hypothyroidism   . IBS (irritable bowel syndrome)    hx  . Keratosis, seborrheic   . Labyrinthitis   . MI (myocardial infarction) ([place])    age 21  . Osteoarthritis   . S/P arthroscopy of right shoulder 03/01/2018  . Skin cancer    Melanoma     Past Surgical History:  Procedure Laterality Date  . Breast Biopsy     dense breast tissue  . BREAST EXCISIONAL BIOPSY Left 2000   benign  . CHOLECYSTECTOMY    . EUS  04/04/2012   Procedure: UPPER ENDOSCOPIC ULTRASOUND (EUS) LINEAR;  Surgeon: Milus Banister, MD;  [facility];  Service: Endoscopy;  Laterality: N/A;  radial linear  . KNEE ARTHROPLASTY     total right  02-28-2010 Dr Pilar Plate Aluisio, left  . KNEE ARTHROPLASTY Bilateral 09/12/2017   Hector Shade  . LEFT HEART CATH AND CORONARY ANGIOGRAPHY N/A 04/20/2017   Procedure: LEFT HEART CATH AND CORONARY ANGIOGRAPHY;  Surgeon: Burnell Blanks, MD;  Location: [facility];  Service: Cardiovascular;  Laterality: N/A;  . TUBAL LIGATION      There were no vitals filed for this visit.  Subjective Assessment - 03/26/18 1251    Subjective  still not feeling well, coughing alot    Currently in Pain?  Yes    Pain Score  3      Pain Location  Shoulder    Pain Orientation  Right         [facility] PT Assessment - 03/26/18 0001      AROM   AROM Assessment Site  --    Right/Left Shoulder  --      PROM   PROM Assessment Site  Shoulder    Right/Left Shoulder  Right   supine   Right Shoulder Flexion  175 Degrees    Right Shoulder ABduction  177 Degrees    Right Shoulder Internal Rotation  80 Degrees    Right Shoulder External Rotation  80 Degrees                   [facility] Adult PT Treatment/Exercise - 03/26/18 0001      Vasopneumatic   Number Minutes Vasopneumatic   15 minutes    Vasopnuematic Location   Shoulder    Vasopneumatic Pressure  Medium    Vasopneumatic Temperature   35      Manual Therapy   Manual Therapy  Passive ROM    Manual therapy comments  cuing to relax needed today. ant humeral head with IR  Soft tissue mobilization  gentle STW and distraction     Passive ROM  RT shld supine all directions               PT Short Term Goals - 03/26/18 1252      PT SHORT TERM GOAL #1   Title  independent with initial HEP    Status  Achieved        PT Long Term Goals - 03/26/18 1253      PT LONG TERM GOAL #1   Title  decrease pain 50% for ADLs    Status  On-going      PT LONG TERM GOAL #2   Title  increase AROM of the right shoulder to 140 degrees flexion    Status  On-going      PT LONG TERM GOAL #3   Title  increase AROM of right shoulder ER to 70 degrees    Status  On-going      PT LONG TERM GOAL #4   Title  reports being able to do hair and dress without pain or limitation    Status  On-going      PT LONG TERM GOAL #5   Title  put 3# on head high shelf    Status  On-going            Plan - 03/26/18 1253    Clinical Impression Statement  pt is 4 weeks out RTC repain so follow protocol and PROM only. excellent PROM and progressing above taregt for timeframe    PT Treatment/Interventions  Cryotherapy;Electrical Stimulation;Iontophoresis 4mg /ml  Dexamethasone;Moist Heat;Therapeutic activities;Therapeutic exercise;Patient/family education;Manual techniques;Vasopneumatic Device;Passive range of motion    PT Next Visit Plan  4 weeks s/p RTC. WHEN CAN WE START AROM and PREs?       Patient will benefit from skilled therapeutic intervention in order to improve the following deficits and impairments:  Increased edema, Decreased activity tolerance, Decreased strength, Impaired UE functional use, Pain, Increased muscle spasms, Decreased range of motion, Postural dysfunction  Visit Diagnosis: Acute pain of right shoulder  Stiffness of right shoulder, not elsewhere classified  Localized edema     Problem List Patient Active Problem List   Diagnosis Date Noted  . Osteoporosis 12/25/2017  . Coronary artery disease   . Dysuria 02/17/2017  . Diarrhea 02/17/2017  . Vitamin D deficiency 11/07/2016  . Acute on chronic diastolic CHF (congestive heart failure), NYHA class 1 ([place]) 11/18/2015  . Diabetes mellitus without complication ([place]) 60/63/0160  . Reactive airway disease with acute exacerbation 09/10/2014  . Nonspecific (abnormal) findings on radiological and other examination of gastrointestinal tract 04/04/2012  . Insomnia, idiopathic 10/04/2010  . Hyperlipidemia 09/22/2009  . Essential hypertension 09/22/2009  . CAD, NATIVE VESSEL 09/22/2009  . EDEMA 05/19/2009  . Hypothyroidism 02/23/2009  . EXTRINSIC ASTHMA, UNSPECIFIED 02/10/2009  . KERATOSIS, SEBORRHEIC [place] 04/16/2007  . SKIN CANCER, HX OF 02/14/2007  . Depression 01/24/2007  . MYOCARDIAL INFARCTION, HX OF 01/24/2007  . GERD 01/24/2007  . Osteoarthritis 01/24/2007  . COLONIC POLYPS, HX OF 01/24/2007  . IRRITABLE BOWEL SYNDROME, HX OF 01/24/2007   Thank You:) PAYSEUR,ANGIE PTA 03/26/2018, 12:55 PM  [address] [place] [place], [place], [address] Phone: [phone]   Fax:  [phone]  Name: Rebecca [name] MRN: [id number] Date of Birth: 1945/08/03

## 2018-03-28 ENCOUNTER — Ambulatory Visit: Payer: Medicare Other | Admitting: Physical Therapy

## 2018-03-28 DIAGNOSIS — R600 Localized edema: Secondary | ICD-10-CM | POA: Diagnosis not present

## 2018-03-28 DIAGNOSIS — M25611 Stiffness of right shoulder, not elsewhere classified: Secondary | ICD-10-CM | POA: Diagnosis not present

## 2018-03-28 DIAGNOSIS — M25511 Pain in right shoulder: Secondary | ICD-10-CM | POA: Diagnosis not present

## 2018-03-28 NOTE — Therapy (Signed)
[address] [place], [place], [address] Phone: [phone]   Fax:  [phone]  Physical Therapy Treatment  Patient Details  Name: Rebecca [name] MRN: [id number] Date of Birth: 1946/06/12 Referring Provider: Jomarie Longs   Encounter Date: 03/28/2018  PT End of Session - 03/28/18 1225    Visit Number  7    Date for PT Re-Evaluation  05/06/18    PT Start Time  1140    PT Stop Time  1230    PT Time Calculation (min)  50 min       Past Medical History:  Diagnosis Date  . Asthma    extrinsic  . CAD (coronary artery disease)   . Chest pain, atypical   . Diabetes ([place])   . Edema   . Fatigue   . Fatigue   . GERD (gastroesophageal reflux disease)   . Hyperlipidemia   . Hypertension    familiar  . Hypothyroidism   . IBS (irritable bowel syndrome)    hx  . Keratosis, seborrheic   . Labyrinthitis   . MI (myocardial infarction) ([place])    age 31  . Osteoarthritis   . S/P arthroscopy of right shoulder 03/01/2018  . Skin cancer    Melanoma     Past Surgical History:  Procedure Laterality Date  . Breast Biopsy     dense breast tissue  . BREAST EXCISIONAL BIOPSY Left 2000   benign  . CHOLECYSTECTOMY    . EUS  04/04/2012   Procedure: UPPER ENDOSCOPIC ULTRASOUND (EUS) LINEAR;  Surgeon: Milus Banister, MD;  [facility];  Service: Endoscopy;  Laterality: N/A;  radial linear  . KNEE ARTHROPLASTY     total right  02-28-2010 Dr Pilar Plate Aluisio, left  . KNEE ARTHROPLASTY Bilateral 09/12/2017   Hector Shade  . LEFT HEART CATH AND CORONARY ANGIOGRAPHY N/A 04/20/2017   Procedure: LEFT HEART CATH AND CORONARY ANGIOGRAPHY;  Surgeon: Burnell Blanks, MD;  Location: [facility];  Service: Cardiovascular;  Laterality: N/A;  . TUBAL LIGATION      There were no vitals filed for this visit.  Subjective Assessment - 03/28/18 1142    Subjective  MD pleased . NO bicep load and only PROM 4 more  weeks untill sees MD again    Currently in Pain?  Yes    Pain Score  3     Pain Location  Shoulder    Pain Orientation  Right                       [facility] Adult PT Treatment/Exercise - 03/28/18 0001      Exercises   Exercises  Shoulder      Shoulder Exercises: Standing   Other Standing Exercises  pulleys flex,abd and IR/ER rotation 15 each       Vasopneumatic   Number Minutes Vasopneumatic   15 minutes    Vasopnuematic Location   Shoulder    Vasopneumatic Pressure  Medium    Vasopneumatic Temperature   35      Manual Therapy   Manual Therapy  Passive ROM    Soft tissue mobilization  gentle STW and distraction     Passive ROM  RT shld supine all directions               PT Short Term Goals - 03/26/18 1252      PT SHORT TERM GOAL #1   Title  independent with initial HEP    Status  Achieved        PT Long Term Goals - 03/26/18 1253      PT LONG TERM GOAL #1   Title  decrease pain 50% for ADLs    Status  On-going      PT LONG TERM GOAL #2   Title  increase AROM of the right shoulder to 140 degrees flexion    Status  On-going      PT LONG TERM GOAL #3   Title  increase AROM of right shoulder ER to 70 degrees    Status  On-going      PT LONG TERM GOAL #4   Title  reports being able to do hair and dress without pain or limitation    Status  On-going      PT LONG TERM GOAL #5   Title  put 3# on head high shelf    Status  On-going            Plan - 03/28/18 1225    Clinical Impression Statement  pt tolerated therapy well, distratcion needed with flex and abd    PT Treatment/Interventions  Cryotherapy;Electrical Stimulation;Iontophoresis 4mg /ml Dexamethasone;Moist Heat;Therapeutic activities;Therapeutic exercise;Patient/family education;Manual techniques;Vasopneumatic Device;Passive range of motion    PT Next Visit Plan  4 more weeks PROM       Patient will benefit from skilled therapeutic intervention in order to improve the  following deficits and impairments:  Increased edema, Decreased activity tolerance, Decreased strength, Impaired UE functional use, Pain, Increased muscle spasms, Decreased range of motion, Postural dysfunction  Visit Diagnosis: Stiffness of right shoulder, not elsewhere classified  Acute pain of right shoulder  Localized edema     Problem List Patient Active Problem List   Diagnosis Date Noted  . Osteoporosis 12/25/2017  . Coronary artery disease   . Dysuria 02/17/2017  . Diarrhea 02/17/2017  . Vitamin D deficiency 11/07/2016  . Acute on chronic diastolic CHF (congestive heart failure), NYHA class 1 ([place]) 11/18/2015  . Diabetes mellitus without complication ([place]) 70/96/2836  . Reactive airway disease with acute exacerbation 09/10/2014  . Nonspecific (abnormal) findings on radiological and other examination of gastrointestinal tract 04/04/2012  . Insomnia, idiopathic 10/04/2010  . Hyperlipidemia 09/22/2009  . Essential hypertension 09/22/2009  . CAD, NATIVE VESSEL 09/22/2009  . EDEMA 05/19/2009  . Hypothyroidism 02/23/2009  . EXTRINSIC ASTHMA, UNSPECIFIED 02/10/2009  . KERATOSIS, SEBORRHEIC [place] 04/16/2007  . SKIN CANCER, HX OF 02/14/2007  . Depression 01/24/2007  . MYOCARDIAL INFARCTION, HX OF 01/24/2007  . GERD 01/24/2007  . Osteoarthritis 01/24/2007  . COLONIC POLYPS, HX OF 01/24/2007  . IRRITABLE BOWEL SYNDROME, HX OF 01/24/2007    PAYSEUR,ANGIE PTA 03/28/2018, 12:26 PM  [place], [place], [address] Phone: [phone]   Fax:  [phone]  Name: Rebecca [name] MRN: [id number] Date of Birth: [birth year]

## 2018-03-29 ENCOUNTER — Encounter: Payer: Medicare Other | Admitting: Physical Therapy

## 2018-04-02 ENCOUNTER — Ambulatory Visit: Payer: Medicare Other | Admitting: Physical Therapy

## 2018-04-02 DIAGNOSIS — M25511 Pain in right shoulder: Secondary | ICD-10-CM

## 2018-04-02 DIAGNOSIS — R600 Localized edema: Secondary | ICD-10-CM

## 2018-04-02 DIAGNOSIS — M25611 Stiffness of right shoulder, not elsewhere classified: Secondary | ICD-10-CM | POA: Diagnosis not present

## 2018-04-02 NOTE — Therapy (Signed)
[address] [place], [place], [address] Phone: [phone]   Fax:  [phone]  Physical Therapy Treatment  Patient Details  Name: Rebecca [name] MRN: [id number] Date of Birth: 1945/09/21 Referring Provider: Jomarie Longs   Encounter Date: 04/02/2018  PT End of Session - 04/02/18 1131    Visit Number  8    Date for PT Re-Evaluation  05/06/18    PT Start Time  8366    PT Stop Time  1145    PT Time Calculation (min)  54 min       Past Medical History:  Diagnosis Date  . Asthma    extrinsic  . CAD (coronary artery disease)   . Chest pain, atypical   . Diabetes ([place])   . Edema   . Fatigue   . Fatigue   . GERD (gastroesophageal reflux disease)   . Hyperlipidemia   . Hypertension    familiar  . Hypothyroidism   . IBS (irritable bowel syndrome)    hx  . Keratosis, seborrheic   . Labyrinthitis   . MI (myocardial infarction) ([place])    age 72  . Osteoarthritis   . S/P arthroscopy of right shoulder 03/01/2018  . Skin cancer    Melanoma     Past Surgical History:  Procedure Laterality Date  . Breast Biopsy     dense breast tissue  . BREAST EXCISIONAL BIOPSY Left 2000   benign  . CHOLECYSTECTOMY    . EUS  04/04/2012   Procedure: UPPER ENDOSCOPIC ULTRASOUND (EUS) LINEAR;  Surgeon: Milus Banister, MD;  [facility];  Service: Endoscopy;  Laterality: N/A;  radial linear  . KNEE ARTHROPLASTY     total right  02-28-2010 Dr Pilar Plate Aluisio, left  . KNEE ARTHROPLASTY Bilateral 09/12/2017   Hector Shade  . LEFT HEART CATH AND CORONARY ANGIOGRAPHY N/A 04/20/2017   Procedure: LEFT HEART CATH AND CORONARY ANGIOGRAPHY;  Surgeon: Burnell Blanks, MD;  Location: [facility];  Service: Cardiovascular;  Laterality: N/A;  . TUBAL LIGATION      There were no vitals filed for this visit.  Subjective Assessment - 04/02/18 1128    Subjective  alittle more sore-slept wrong last night     Currently in Pain?  Yes    Pain Score  4     Pain Location  Shoulder    Pain Orientation  Right                       [facility] Adult PT Treatment/Exercise - 04/02/18 0001      Shoulder Exercises: Standing   Other Standing Exercises  pulleys flex,abd and IR/ER rotation 20 each     Other Standing Exercises  IR stretch at desl      Vasopneumatic   Number Minutes Vasopneumatic   15 minutes    Vasopnuematic Location   Shoulder    Vasopneumatic Pressure  Medium    Vasopneumatic Temperature   35      Manual Therapy   Manual Therapy  Passive ROM    Passive ROM  RT shld supine all directions             PT Education - 04/02/18 1131    Education provided  Yes    Education Details  reviewed HEP    Person(s) Educated  Patient    Methods  Explanation;Demonstration    Comprehension  Verbalized understanding  PT Short Term Goals - 03/26/18 1252      PT SHORT TERM GOAL #1   Title  independent with initial HEP    Status  Achieved        PT Long Term Goals - 03/26/18 1253      PT LONG TERM GOAL #1   Title  decrease pain 50% for ADLs    Status  On-going      PT LONG TERM GOAL #2   Title  increase AROM of the right shoulder to 140 degrees flexion    Status  On-going      PT LONG TERM GOAL #3   Title  increase AROM of right shoulder ER to 70 degrees    Status  On-going      PT LONG TERM GOAL #4   Title  reports being able to do hair and dress without pain or limitation    Status  On-going      PT LONG TERM GOAL #5   Title  put 3# on head high shelf    Status  On-going            Plan - 04/02/18 1133    Clinical Impression Statement  cuing initially to relax but then tolerates PROM well and has great PROM.    PT Treatment/Interventions  Cryotherapy;Electrical Stimulation;Iontophoresis 4mg /ml Dexamethasone;Moist Heat;Therapeutic activities;Therapeutic exercise;Patient/family education;Manual techniques;Vasopneumatic Device;Passive range of  motion    PT Next Visit Plan  4 more weeks PROM       Patient will benefit from skilled therapeutic intervention in order to improve the following deficits and impairments:  Increased edema, Decreased activity tolerance, Decreased strength, Impaired UE functional use, Pain, Increased muscle spasms, Decreased range of motion, Postural dysfunction  Visit Diagnosis: Stiffness of right shoulder, not elsewhere classified  Acute pain of right shoulder  Localized edema     Problem List Patient Active Problem List   Diagnosis Date Noted  . Osteoporosis 12/25/2017  . Coronary artery disease   . Dysuria 02/17/2017  . Diarrhea 02/17/2017  . Vitamin D deficiency 11/07/2016  . Acute on chronic diastolic CHF (congestive heart failure), NYHA class 1 ([place]) 11/18/2015  . Diabetes mellitus without complication ([place]) 33/54/5625  . Reactive airway disease with acute exacerbation 09/10/2014  . Nonspecific (abnormal) findings on radiological and other examination of gastrointestinal tract 04/04/2012  . Insomnia, idiopathic 10/04/2010  . Hyperlipidemia 09/22/2009  . Essential hypertension 09/22/2009  . CAD, NATIVE VESSEL 09/22/2009  . EDEMA 05/19/2009  . Hypothyroidism 02/23/2009  . EXTRINSIC ASTHMA, UNSPECIFIED 02/10/2009  . KERATOSIS, SEBORRHEIC [place] 04/16/2007  . SKIN CANCER, HX OF 02/14/2007  . Depression 01/24/2007  . MYOCARDIAL INFARCTION, HX OF 01/24/2007  . GERD 01/24/2007  . Osteoarthritis 01/24/2007  . COLONIC POLYPS, HX OF 01/24/2007  . IRRITABLE BOWEL SYNDROME, HX OF 01/24/2007    Dalten Ambrosino,ANGIE PTA 04/02/2018, 11:34 AM  [address] [place], [place], [address] Phone: [phone]   Fax:  [phone]  Name: DARRIS CARACHURE MRN: [id number] Date of Birth: 06/04/1946

## 2018-04-05 ENCOUNTER — Ambulatory Visit: Payer: Medicare Other | Admitting: Physical Therapy

## 2018-04-05 DIAGNOSIS — M25511 Pain in right shoulder: Secondary | ICD-10-CM | POA: Diagnosis not present

## 2018-04-05 DIAGNOSIS — M25611 Stiffness of right shoulder, not elsewhere classified: Secondary | ICD-10-CM | POA: Diagnosis not present

## 2018-04-05 DIAGNOSIS — Z23 Encounter for immunization: Secondary | ICD-10-CM | POA: Diagnosis not present

## 2018-04-05 DIAGNOSIS — R600 Localized edema: Secondary | ICD-10-CM

## 2018-04-05 NOTE — Therapy (Signed)
[address] [place], [place], [address] Phone: [phone]   Fax:  [phone]  Physical Therapy Treatment  Patient Details  Name: Rebecca [name] MRN: [id number] Date of Birth: [birth year] Referring Provider: Jomarie Longs   Encounter Date: 04/05/2018  PT End of Session - 04/05/18 1132    Visit Number  9    Date for PT Re-Evaluation  05/06/18    PT Start Time  1100    PT Stop Time  1155    PT Time Calculation (min)  55 min       Past Medical History:  Diagnosis Date  . Asthma    extrinsic  . CAD (coronary artery disease)   . Chest pain, atypical   . Diabetes ([place])   . Edema   . Fatigue   . Fatigue   . GERD (gastroesophageal reflux disease)   . Hyperlipidemia   . Hypertension    familiar  . Hypothyroidism   . IBS (irritable bowel syndrome)    hx  . Keratosis, seborrheic   . Labyrinthitis   . MI (myocardial infarction) ([place])    age 89  . Osteoarthritis   . S/P arthroscopy of right shoulder 03/01/2018  . Skin cancer    Melanoma     Past Surgical History:  Procedure Laterality Date  . Breast Biopsy     dense breast tissue  . BREAST EXCISIONAL BIOPSY Left 2000   benign  . CHOLECYSTECTOMY    . EUS  04/04/2012   Procedure: UPPER ENDOSCOPIC ULTRASOUND (EUS) LINEAR;  Surgeon: Milus Banister, MD;  [facility]: [facility];  Service: Endoscopy;  Laterality: N/A;  radial linear  . KNEE ARTHROPLASTY     total right  02-28-2010 Dr Pilar Plate Aluisio, left  . KNEE ARTHROPLASTY Bilateral 09/12/2017   Hector Shade  . LEFT HEART CATH AND CORONARY ANGIOGRAPHY N/A 04/20/2017   Procedure: LEFT HEART CATH AND CORONARY ANGIOGRAPHY;  Surgeon: Burnell Blanks, MD;  Location: [facility];  Service: Cardiovascular;  Laterality: N/A;  . TUBAL LIGATION      There were no vitals filed for this visit.  Subjective Assessment - 04/05/18 1059    Subjective  wearing on me, abit more pain. maybe weather.  taking no meds    Currently in Pain?  Yes    Pain Score  5     Pain Location  Shoulder    Pain Orientation  Right         [facility] PT Assessment - 04/05/18 0001      PROM   Right Shoulder Flexion  175 Degrees    Right Shoulder ABduction  177 Degrees    Right Shoulder Internal Rotation  81 Degrees    Right Shoulder External Rotation  80 Degrees                   [facility] Adult PT Treatment/Exercise - 04/05/18 0001      Exercises   Exercises  Shoulder      Shoulder Exercises: Standing   Other Standing Exercises  pulleys flex,abd,ext and IR/ER rotation 20 each       Electrical Stimulation   Electrical Stimulation Location  RT shld    Electrical Stimulation Action  IFC    Electrical Stimulation Parameters  sitting    Electrical Stimulation Goals  Pain      Vasopneumatic   Number Minutes Vasopneumatic   15 minutes    Vasopnuematic Location  Shoulder    Vasopneumatic Pressure  Medium    Vasopneumatic Temperature   35      Manual Therapy   Manual Therapy  Passive ROM    Joint Mobilization  gentle    Soft tissue mobilization  pect    Passive ROM  RT shld supine all directions               PT Short Term Goals - 03/26/18 1252      PT SHORT TERM GOAL #1   Title  independent with initial HEP    Status  Achieved        PT Long Term Goals - 03/26/18 1253      PT LONG TERM GOAL #1   Title  decrease pain 50% for ADLs    Status  On-going      PT LONG TERM GOAL #2   Title  increase AROM of the right shoulder to 140 degrees flexion    Status  On-going      PT LONG TERM GOAL #3   Title  increase AROM of right shoulder ER to 70 degrees    Status  On-going      PT LONG TERM GOAL #4   Title  reports being able to do hair and dress without pain or limitation    Status  On-going      PT LONG TERM GOAL #5   Title  put 3# on head high shelf    Status  On-going            Plan - 04/05/18 1132    Clinical Impression Statement  PROM remains very  good, slow to get there as she is tight and guarded at start but loosens. following prot of PROM so limited goal progress. cautioned pt about over use as she is in alittle  more pain- added estim    PT Treatment/Interventions  Cryotherapy;Electrical Stimulation;Iontophoresis 4mg /ml Dexamethasone;Moist Heat;Therapeutic activities;Therapeutic exercise;Patient/family education;Manual techniques;Vasopneumatic Device;Passive range of motion    PT Next Visit Plan  3 more weeks PROM       Patient will benefit from skilled therapeutic intervention in order to improve the following deficits and impairments:  Increased edema, Decreased activity tolerance, Decreased strength, Impaired UE functional use, Pain, Increased muscle spasms, Decreased range of motion, Postural dysfunction  Visit Diagnosis: Stiffness of right shoulder, not elsewhere classified  Acute pain of right shoulder  Localized edema     Problem List Patient Active Problem List   Diagnosis Date Noted  . Osteoporosis 12/25/2017  . Coronary artery disease   . Dysuria 02/17/2017  . Diarrhea 02/17/2017  . Vitamin D deficiency 11/07/2016  . Acute on chronic diastolic CHF (congestive heart failure), NYHA class 1 ([place]) 11/18/2015  . Diabetes mellitus without complication ([place]) 91/63/8466  . Reactive airway disease with acute exacerbation 09/10/2014  . Nonspecific (abnormal) findings on radiological and other examination of gastrointestinal tract 04/04/2012  . Insomnia, idiopathic 10/04/2010  . Hyperlipidemia 09/22/2009  . Essential hypertension 09/22/2009  . CAD, NATIVE VESSEL 09/22/2009  . EDEMA 05/19/2009  . Hypothyroidism 02/23/2009  . EXTRINSIC ASTHMA, UNSPECIFIED 02/10/2009  . KERATOSIS, SEBORRHEIC [place] 04/16/2007  . SKIN CANCER, HX OF 02/14/2007  . Depression 01/24/2007  . MYOCARDIAL INFARCTION, HX OF 01/24/2007  . GERD 01/24/2007  . Osteoarthritis 01/24/2007  . COLONIC POLYPS, HX OF 01/24/2007  . IRRITABLE BOWEL  SYNDROME, HX OF 01/24/2007    Joah Patlan,ANGIE PTA 04/05/2018, 11:35 AM  [facility] [id number]  [facility] [place], [place], [address] Phone: [phone]   Fax:  [phone]  Name: Rebecca [name] MRN: [id number] Date of Birth: 1946-04-29

## 2018-04-09 ENCOUNTER — Ambulatory Visit: Payer: Medicare Other | Admitting: Physical Therapy

## 2018-04-09 DIAGNOSIS — M25511 Pain in right shoulder: Secondary | ICD-10-CM | POA: Diagnosis not present

## 2018-04-09 DIAGNOSIS — M25611 Stiffness of right shoulder, not elsewhere classified: Secondary | ICD-10-CM | POA: Diagnosis not present

## 2018-04-09 DIAGNOSIS — R600 Localized edema: Secondary | ICD-10-CM | POA: Diagnosis not present

## 2018-04-09 NOTE — Therapy (Signed)
[address] [place], [place], [address] Phone: [phone]   Fax:  [phone]  Physical Therapy Treatment  Patient Details  Name: Rebecca [name] MRN: [id number] Date of Birth: 21-May-1946 Referring Provider: Jomarie Longs   Encounter Date: 04/09/2018  PT End of Session - 04/09/18 1105    Visit Number  10    Date for PT Re-Evaluation  05/06/18    PT Start Time  1100    PT Stop Time  1150    PT Time Calculation (min)  50 min       Past Medical History:  Diagnosis Date  . Asthma    extrinsic  . CAD (coronary artery disease)   . Chest pain, atypical   . Diabetes ([place])   . Edema   . Fatigue   . Fatigue   . GERD (gastroesophageal reflux disease)   . Hyperlipidemia   . Hypertension    familiar  . Hypothyroidism   . IBS (irritable bowel syndrome)    hx  . Keratosis, seborrheic   . Labyrinthitis   . MI (myocardial infarction) ([place])    age 30  . Osteoarthritis   . S/P arthroscopy of right shoulder 03/01/2018  . Skin cancer    Melanoma     Past Surgical History:  Procedure Laterality Date  . Breast Biopsy     dense breast tissue  . BREAST EXCISIONAL BIOPSY Left 2000   benign  . CHOLECYSTECTOMY    . EUS  04/04/2012   Procedure: UPPER ENDOSCOPIC ULTRASOUND (EUS) LINEAR;  Surgeon: Milus Banister, MD;  [facility];  Service: Endoscopy;  Laterality: N/A;  radial linear  . KNEE ARTHROPLASTY     total right  02-28-2010 Dr Pilar Plate Aluisio, left  . KNEE ARTHROPLASTY Bilateral 09/12/2017   Hector Shade  . LEFT HEART CATH AND CORONARY ANGIOGRAPHY N/A 04/20/2017   Procedure: LEFT HEART CATH AND CORONARY ANGIOGRAPHY;  Surgeon: Burnell Blanks, MD;  Location: [facility];  Service: Cardiovascular;  Laterality: N/A;  . TUBAL LIGATION      There were no vitals filed for this visit.  Subjective Assessment - 04/09/18 1056    Subjective  1 bad day over weekend but okay. not sure  estim helped- ice is great    Currently in Pain?  Yes    Pain Score  4     Pain Location  Shoulder    Pain Orientation  Right                       [facility] Adult PT Treatment/Exercise - 04/09/18 0001      Shoulder Exercises: Standing   Other Standing Exercises  pulleys flex,abd,ext and IR/ER rotation 20 each    bicep curl,shruggs, backward rolls 15 each   Other Standing Exercises  ball rolling fwd/back, circles 20 each Left hand controlling mvmt      Vasopneumatic   Number Minutes Vasopneumatic   15 minutes    Vasopnuematic Location   Shoulder    Vasopneumatic Pressure  Medium    Vasopneumatic Temperature   35      Manual Therapy   Manual Therapy  Passive ROM    Joint Mobilization  gentle    Soft tissue mobilization  pect    Passive ROM  RT shld supine all directions               PT Short Term Goals - 03/26/18  [place] #1   Title  independent with initial HEP    Status  Achieved        PT Long Term Goals - 03/26/18 1253      PT LONG TERM GOAL #1   Title  decrease pain 50% for ADLs    Status  On-going      PT LONG TERM GOAL #2   Title  increase AROM of the right shoulder to 140 degrees flexion    Status  On-going      PT LONG TERM GOAL #3   Title  increase AROM of right shoulder ER to 70 degrees    Status  On-going      PT LONG TERM GOAL #4   Title  reports being able to do hair and dress without pain or limitation    Status  On-going      PT LONG TERM GOAL #5   Title  put 3# on head high shelf    Status  On-going            Plan - 04/09/18 1106    Clinical Impression Statement  increased mvmt with PROM and does better when in control- but PROM after relaxing did well    PT Treatment/Interventions  Cryotherapy;Electrical Stimulation;Iontophoresis 4mg /ml Dexamethasone;Moist Heat;Therapeutic activities;Therapeutic exercise;Patient/family education;Manual techniques;Vasopneumatic Device;Passive range of motion     PT Next Visit Plan  2 more weeks PROM       Patient will benefit from skilled therapeutic intervention in order to improve the following deficits and impairments:  Increased edema, Decreased activity tolerance, Decreased strength, Impaired UE functional use, Pain, Increased muscle spasms, Decreased range of motion, Postural dysfunction  Visit Diagnosis: Stiffness of right shoulder, not elsewhere classified  Acute pain of right shoulder  Localized edema     Problem List Patient Active Problem List   Diagnosis Date Noted  . Osteoporosis 12/25/2017  . Coronary artery disease   . Dysuria 02/17/2017  . Diarrhea 02/17/2017  . Vitamin D deficiency 11/07/2016  . Acute on chronic diastolic CHF (congestive heart failure), NYHA class 1 ([place]) 11/18/2015  . Diabetes mellitus without complication ([place]) 44/09/4740  . Reactive airway disease with acute exacerbation 09/10/2014  . Nonspecific (abnormal) findings on radiological and other examination of gastrointestinal tract 04/04/2012  . Insomnia, idiopathic 10/04/2010  . Hyperlipidemia 09/22/2009  . Essential hypertension 09/22/2009  . CAD, NATIVE VESSEL 09/22/2009  . EDEMA 05/19/2009  . Hypothyroidism 02/23/2009  . EXTRINSIC ASTHMA, UNSPECIFIED 02/10/2009  . KERATOSIS, SEBORRHEIC [place] 04/16/2007  . SKIN CANCER, HX OF 02/14/2007  . Depression 01/24/2007  . MYOCARDIAL INFARCTION, HX OF 01/24/2007  . GERD 01/24/2007  . Osteoarthritis 01/24/2007  . COLONIC POLYPS, HX OF 01/24/2007  . IRRITABLE BOWEL SYNDROME, HX OF 01/24/2007    Valisha Heslin,ANGIE PTA 04/09/2018, 11:08 AM  [address] [place], [place], [address] Phone: [phone]   Fax:  [phone]  Name: Rebecca [name] MRN: [id number] Date of Birth: Apr 04, 1946

## 2018-04-12 ENCOUNTER — Ambulatory Visit: Payer: Medicare Other | Admitting: Physical Therapy

## 2018-04-12 DIAGNOSIS — M25511 Pain in right shoulder: Secondary | ICD-10-CM | POA: Diagnosis not present

## 2018-04-12 DIAGNOSIS — R600 Localized edema: Secondary | ICD-10-CM | POA: Diagnosis not present

## 2018-04-12 DIAGNOSIS — M25611 Stiffness of right shoulder, not elsewhere classified: Secondary | ICD-10-CM

## 2018-04-12 NOTE — Therapy (Signed)
[address] [place], [place], [address] Phone: [phone]   Fax:  [phone]  Physical Therapy Treatment  Patient Details  Name: Rebecca [name] MRN: [id number] Date of Birth: 1945-12-29 Referring Provider (PT): RA Theda Sers   Encounter Date: 04/12/2018  PT End of Session - 04/12/18 1131    Visit Number  11    Date for PT Re-Evaluation  05/06/18    PT Start Time  1100    PT Stop Time  1148    PT Time Calculation (min)  48 min       Past Medical History:  Diagnosis Date  . Asthma    extrinsic  . CAD (coronary artery disease)   . Chest pain, atypical   . Diabetes ([place])   . Edema   . Fatigue   . Fatigue   . GERD (gastroesophageal reflux disease)   . Hyperlipidemia   . Hypertension    familiar  . Hypothyroidism   . IBS (irritable bowel syndrome)    hx  . Keratosis, seborrheic   . Labyrinthitis   . MI (myocardial infarction) ([place])    age 18  . Osteoarthritis   . S/P arthroscopy of right shoulder 03/01/2018  . Skin cancer    Melanoma     Past Surgical History:  Procedure Laterality Date  . Breast Biopsy     dense breast tissue  . BREAST EXCISIONAL BIOPSY Left 2000   benign  . CHOLECYSTECTOMY    . EUS  04/04/2012   Procedure: UPPER ENDOSCOPIC ULTRASOUND (EUS) LINEAR;  Surgeon: Milus Banister, MD;  [facility];  Service: Endoscopy;  Laterality: N/A;  radial linear  . KNEE ARTHROPLASTY     total right  02-28-2010 Dr Pilar Plate Aluisio, left  . KNEE ARTHROPLASTY Bilateral 09/12/2017   Hector Shade  . LEFT HEART CATH AND CORONARY ANGIOGRAPHY N/A 04/20/2017   Procedure: LEFT HEART CATH AND CORONARY ANGIOGRAPHY;  Surgeon: Burnell Blanks, MD;  Location: [facility];  Service: Cardiovascular;  Laterality: N/A;  . TUBAL LIGATION      There were no vitals filed for this visit.  Subjective Assessment - 04/12/18 1059    Subjective  very sore after last session. I have been  more mindful of not over using arm    Currently in Pain?  Yes    Pain Score  5     Pain Location  Shoulder    Pain Orientation  Right                       [facility] Adult PT Treatment/Exercise - 04/12/18 0001      Shoulder Exercises: Standing   Other Standing Exercises  pulleys flex,abd,ext and IR/ER rotation 20 each    PTA assist to increase ROM and decrease compensations     Vasopneumatic   Number Minutes Vasopneumatic   15 minutes    Vasopnuematic Location   Shoulder    Vasopneumatic Pressure  Medium    Vasopneumatic Temperature   35      Manual Therapy   Manual Therapy  Passive ROM    Joint Mobilization  gentle    Soft tissue mobilization  pect    Passive ROM  RT shld supine all directions               PT Short Term Goals - 03/26/18 1252      PT SHORT TERM GOAL #1  Title  independent with initial HEP    Status  Achieved        PT Long Term Goals - 03/26/18 1253      PT LONG TERM GOAL #1   Title  decrease pain 50% for ADLs    Status  On-going      PT LONG TERM GOAL #2   Title  increase AROM of the right shoulder to 140 degrees flexion    Status  On-going      PT LONG TERM GOAL #3   Title  increase AROM of right shoulder ER to 70 degrees    Status  On-going      PT LONG TERM GOAL #4   Title  reports being able to do hair and dress without pain or limitation    Status  On-going      PT LONG TERM GOAL #5   Title  put 3# on head high shelf    Status  On-going            Plan - 04/12/18 1131    Clinical Impression Statement  increaed resistance today to PROm, increased tactile and VCing to relax    PT Treatment/Interventions  Cryotherapy;Electrical Stimulation;Iontophoresis 4mg /ml Dexamethasone;Moist Heat;Therapeutic activities;Therapeutic exercise;Patient/family education;Manual techniques;Vasopneumatic Device;Passive range of motion    PT Next Visit Plan  2 more weeks PROM       Patient will benefit from skilled  therapeutic intervention in order to improve the following deficits and impairments:  Increased edema, Decreased activity tolerance, Decreased strength, Impaired UE functional use, Pain, Increased muscle spasms, Decreased range of motion, Postural dysfunction  Visit Diagnosis: Stiffness of right shoulder, not elsewhere classified  Acute pain of right shoulder  Localized edema     Problem List Patient Active Problem List   Diagnosis Date Noted  . Osteoporosis 12/25/2017  . Coronary artery disease   . Dysuria 02/17/2017  . Diarrhea 02/17/2017  . Vitamin D deficiency 11/07/2016  . Acute on chronic diastolic CHF (congestive heart failure), NYHA class 1 ([place]) 11/18/2015  . Diabetes mellitus without complication ([place]) 76/16/0737  . Reactive airway disease with acute exacerbation 09/10/2014  . Nonspecific (abnormal) findings on radiological and other examination of gastrointestinal tract 04/04/2012  . Insomnia, idiopathic 10/04/2010  . Hyperlipidemia 09/22/2009  . Essential hypertension 09/22/2009  . CAD, NATIVE VESSEL 09/22/2009  . EDEMA 05/19/2009  . Hypothyroidism 02/23/2009  . EXTRINSIC ASTHMA, UNSPECIFIED 02/10/2009  . KERATOSIS, SEBORRHEIC [place] 04/16/2007  . SKIN CANCER, HX OF 02/14/2007  . Depression 01/24/2007  . MYOCARDIAL INFARCTION, HX OF 01/24/2007  . GERD 01/24/2007  . Osteoarthritis 01/24/2007  . COLONIC POLYPS, HX OF 01/24/2007  . IRRITABLE BOWEL SYNDROME, HX OF 01/24/2007    Burlene Montecalvo,ANGIE  PTA 04/12/2018, 11:33 AM  [address] [place], [place], [address] Phone: [phone]   Fax:  [phone]  Name: Rebecca [name] MRN: [id number] Date of Birth: March 16, 1946

## 2018-04-16 ENCOUNTER — Ambulatory Visit: Payer: Medicare Other | Attending: Orthopedic Surgery | Admitting: Physical Therapy

## 2018-04-16 DIAGNOSIS — M25511 Pain in right shoulder: Secondary | ICD-10-CM | POA: Insufficient documentation

## 2018-04-16 DIAGNOSIS — M25611 Stiffness of right shoulder, not elsewhere classified: Secondary | ICD-10-CM | POA: Insufficient documentation

## 2018-04-16 DIAGNOSIS — R600 Localized edema: Secondary | ICD-10-CM | POA: Insufficient documentation

## 2018-04-16 NOTE — Therapy (Signed)
[address] [place], [place], [address] Phone: [phone]   Fax:  [phone]  Physical Therapy Treatment  Patient Details  Name: Rebecca [name] DOBLER MRN: [id number] Date of Birth: 04-12-1946 Referring Provider (PT): RA Theda Sers   Encounter Date: 04/16/2018  PT End of Session - 04/16/18 1549    Visit Number  12    Date for PT Re-Evaluation  05/06/18    PT Start Time  5361    PT Stop Time  1610    PT Time Calculation (min)  47 min       Past Medical History:  Diagnosis Date  . Asthma    extrinsic  . CAD (coronary artery disease)   . Chest pain, atypical   . Diabetes ([place])   . Edema   . Fatigue   . Fatigue   . GERD (gastroesophageal reflux disease)   . Hyperlipidemia   . Hypertension    familiar  . Hypothyroidism   . IBS (irritable bowel syndrome)    hx  . Keratosis, seborrheic   . Labyrinthitis   . MI (myocardial infarction) ([place])    age 37  . Osteoarthritis   . S/P arthroscopy of right shoulder 03/01/2018  . Skin cancer    Melanoma     Past Surgical History:  Procedure Laterality Date  . Breast Biopsy     dense breast tissue  . BREAST EXCISIONAL BIOPSY Left 2000   benign  . CHOLECYSTECTOMY    . EUS  04/04/2012   Procedure: UPPER ENDOSCOPIC ULTRASOUND (EUS) LINEAR;  Surgeon: Milus Banister, MD;  [facility];  Service: Endoscopy;  Laterality: N/A;  radial linear  . KNEE ARTHROPLASTY     total right  02-28-2010 Dr Pilar Plate Aluisio, left  . KNEE ARTHROPLASTY Bilateral 09/12/2017   Hector Shade  . LEFT HEART CATH AND CORONARY ANGIOGRAPHY N/A 04/20/2017   Procedure: LEFT HEART CATH AND CORONARY ANGIOGRAPHY;  Surgeon: Burnell Blanks, MD;  Location: [facility];  Service: Cardiovascular;  Laterality: N/A;  . TUBAL LIGATION      There were no vitals filed for this visit.  Subjective Assessment - 04/16/18 1547    Subjective  very sore again since last session and I  have been watching so I don't overdo at home    Currently in Pain?  Yes    Pain Score  5     Pain Location  Shoulder    Pain Orientation  Right                       [facility] Adult PT Treatment/Exercise - 04/16/18 0001      Vasopneumatic   Number Minutes Vasopneumatic   15 minutes    Vasopnuematic Location   Shoulder    Vasopneumatic Pressure  Medium    Vasopneumatic Temperature   35      Manual Therapy   Manual Therapy  Passive ROM    Manual therapy comments  cuing to relax needed today. verb and tactile   ROM progressively got better with each set    Passive ROM  RT shld supine all directions               PT Short Term Goals - 03/26/18 1252      PT SHORT TERM GOAL #1   Title  independent with initial HEP    Status  Achieved        PT  Long Term Goals - 03/26/18 1253      PT LONG TERM GOAL #1   Title  decrease pain 50% for ADLs    Status  On-going      PT LONG TERM GOAL #2   Title  increase AROM of the right shoulder to 140 degrees flexion    Status  On-going      PT LONG TERM GOAL #3   Title  increase AROM of right shoulder ER to 70 degrees    Status  On-going      PT LONG TERM GOAL #4   Title  reports being able to do hair and dress without pain or limitation    Status  On-going      PT LONG TERM GOAL #5   Title  put 3# on head high shelf    Status  On-going            Plan - 04/16/18 1549    Clinical Impression Statement  discussed with pt need to stretch and pendulum at home and cautioned against use unless elbow at side and pt VU. discussed with pt might would benefit from taking her muscle relaxers. pt progressively relaxerd and had increased ROM and we progressed    PT Treatment/Interventions  Cryotherapy;Electrical Stimulation;Iontophoresis 4mg /ml Dexamethasone;Moist Heat;Therapeutic activities;Therapeutic exercise;Patient/family education;Manual techniques;Vasopneumatic Device;Passive range of motion    PT Next Visit Plan   2 more weeks PROM       Patient will benefit from skilled therapeutic intervention in order to improve the following deficits and impairments:  Increased edema, Decreased activity tolerance, Decreased strength, Impaired UE functional use, Pain, Increased muscle spasms, Decreased range of motion, Postural dysfunction  Visit Diagnosis: Stiffness of right shoulder, not elsewhere classified  Acute pain of right shoulder  Localized edema     Problem List Patient Active Problem List   Diagnosis Date Noted  . Osteoporosis 12/25/2017  . Coronary artery disease   . Dysuria 02/17/2017  . Diarrhea 02/17/2017  . Vitamin D deficiency 11/07/2016  . Acute on chronic diastolic CHF (congestive heart failure), NYHA class 1 ([place]) 11/18/2015  . Diabetes mellitus without complication ([place]) 16/04/9603  . Reactive airway disease with acute exacerbation 09/10/2014  . Nonspecific (abnormal) findings on radiological and other examination of gastrointestinal tract 04/04/2012  . Insomnia, idiopathic 10/04/2010  . Hyperlipidemia 09/22/2009  . Essential hypertension 09/22/2009  . CAD, NATIVE VESSEL 09/22/2009  . EDEMA 05/19/2009  . Hypothyroidism 02/23/2009  . EXTRINSIC ASTHMA, UNSPECIFIED 02/10/2009  . KERATOSIS, SEBORRHEIC [place] 04/16/2007  . SKIN CANCER, HX OF 02/14/2007  . Depression 01/24/2007  . MYOCARDIAL INFARCTION, HX OF 01/24/2007  . GERD 01/24/2007  . Osteoarthritis 01/24/2007  . COLONIC POLYPS, HX OF 01/24/2007  . IRRITABLE BOWEL SYNDROME, HX OF 01/24/2007    PAYSEUR,ANGIE PTA 04/16/2018, 3:52 PM  [address] [place], [place], [address] Phone: [phone]   Fax:  [phone]  Name: DAVAN HARK MRN: [id number] Date of Birth: 08/06/1945

## 2018-04-18 ENCOUNTER — Ambulatory Visit: Payer: Medicare Other | Admitting: Physical Therapy

## 2018-04-18 DIAGNOSIS — M25611 Stiffness of right shoulder, not elsewhere classified: Secondary | ICD-10-CM | POA: Diagnosis not present

## 2018-04-18 DIAGNOSIS — M25511 Pain in right shoulder: Secondary | ICD-10-CM

## 2018-04-18 DIAGNOSIS — R600 Localized edema: Secondary | ICD-10-CM

## 2018-04-18 NOTE — Therapy (Signed)
[facility] [address] [place], [place], [address] Phone: [phone]   Fax:  [phone]  Physical Therapy Treatment  Patient Details  Name: Rebecca [name] MRN: [id number] Date of Birth: 13-Aug-1945 Referring Provider (PT): RA Theda Sers   Encounter Date: 04/18/2018  PT End of Session - 04/18/18 1104    Visit Number  13    Date for PT Re-Evaluation  05/06/18    PT Start Time  1050    PT Stop Time  1140    PT Time Calculation (min)  50 min       Past Medical History:  Diagnosis Date  . Asthma    extrinsic  . CAD (coronary artery disease)   . Chest pain, atypical   . Diabetes ([place])   . Edema   . Fatigue   . Fatigue   . GERD (gastroesophageal reflux disease)   . Hyperlipidemia   . Hypertension    familiar  . Hypothyroidism   . IBS (irritable bowel syndrome)    hx  . Keratosis, seborrheic   . Labyrinthitis   . MI (myocardial infarction) ([place])    age 81  . Osteoarthritis   . S/P arthroscopy of right shoulder 03/01/2018  . Skin cancer    Melanoma     Past Surgical History:  Procedure Laterality Date  . Breast Biopsy     dense breast tissue  . BREAST EXCISIONAL BIOPSY Left 2000   benign  . CHOLECYSTECTOMY    . EUS  04/04/2012   Procedure: UPPER ENDOSCOPIC ULTRASOUND (EUS) LINEAR;  Surgeon: Milus Banister, MD;  [facility];  Service: Endoscopy;  Laterality: N/A;  radial linear  . KNEE ARTHROPLASTY     total right  02-28-2010 Dr Pilar Plate Aluisio, left  . KNEE ARTHROPLASTY Bilateral 09/12/2017   Hector Shade  . LEFT HEART CATH AND CORONARY ANGIOGRAPHY N/A 04/20/2017   Procedure: LEFT HEART CATH AND CORONARY ANGIOGRAPHY;  Surgeon: Burnell Blanks, MD;  Location: [facility];  Service: Cardiovascular;  Laterality: N/A;  . TUBAL LIGATION      There were no vitals filed for this visit.  Subjective Assessment - 04/18/18 1101    Subjective  definately better    Currently in Pain?   Yes    Pain Score  3     Pain Location  Shoulder    Pain Orientation  Right                       [facility] Adult PT Treatment/Exercise - 04/18/18 0001      Shoulder Exercises: Standing   Other Standing Exercises  finger ladder 8 times flex and abd    Other Standing Exercises  cane ex 10 reps each way      Shoulder Exercises: ROM/Strengthening   UBE (Upper Arm Bike)  L 1 2 min fwd/ 2 back - Left UE doing most of work)      Vasopneumatic   Number Minutes Vasopneumatic   15 minutes    Vasopnuematic Location   Shoulder    Vasopneumatic Pressure  Medium    Vasopneumatic Temperature   35      Manual Therapy   Manual Therapy  Passive ROM    Passive ROM  RT shld supine all directions               PT Short Term Goals - 03/26/18 1252      PT SHORT TERM  GOAL #1   Title  independent with initial HEP    Status  Achieved        PT Long Term Goals - 04/18/18 1105      PT LONG TERM GOAL #1   Title  decrease pain 50% for ADLs    Status  On-going      PT LONG TERM GOAL #2   Title  increase AROM of the right shoulder to 140 degrees flexion    Status  On-going      PT LONG TERM GOAL #3   Title  increase AROM of right shoulder ER to 70 degrees    Status  On-going      PT LONG TERM GOAL #4   Title  reports being able to do hair and dress without pain or limitation    Status  On-going      PT LONG TERM GOAL #5   Title  put 3# on head high shelf    Status  On-going            Plan - 04/18/18 1105    Clinical Impression Statement  porgressing towards goals but limited by protocol. started gentle AA today, left hand doing most of work    PT Treatment/Interventions  Cryotherapy;Electrical Stimulation;Iontophoresis 4mg /ml Dexamethasone;Moist Heat;Therapeutic activities;Therapeutic exercise;Patient/family education;Manual techniques;Vasopneumatic Device;Passive range of motion    PT Next Visit Plan  1 more weeks PROM- MD 10/9       Patient will benefit  from skilled therapeutic intervention in order to improve the following deficits and impairments:  Increased edema, Decreased activity tolerance, Decreased strength, Impaired UE functional use, Pain, Increased muscle spasms, Decreased range of motion, Postural dysfunction  Visit Diagnosis: Stiffness of right shoulder, not elsewhere classified  Acute pain of right shoulder  Localized edema     Problem List Patient Active Problem List   Diagnosis Date Noted  . Osteoporosis 12/25/2017  . Coronary artery disease   . Dysuria 02/17/2017  . Diarrhea 02/17/2017  . Vitamin D deficiency 11/07/2016  . Acute on chronic diastolic CHF (congestive heart failure), NYHA class 1 ([place]) 11/18/2015  . Diabetes mellitus without complication ([place]) 98/33/8250  . Reactive airway disease with acute exacerbation 09/10/2014  . Nonspecific (abnormal) findings on radiological and other examination of gastrointestinal tract 04/04/2012  . Insomnia, idiopathic 10/04/2010  . Hyperlipidemia 09/22/2009  . Essential hypertension 09/22/2009  . CAD, NATIVE VESSEL 09/22/2009  . EDEMA 05/19/2009  . Hypothyroidism 02/23/2009  . EXTRINSIC ASTHMA, UNSPECIFIED 02/10/2009  . KERATOSIS, SEBORRHEIC [place] 04/16/2007  . SKIN CANCER, HX OF 02/14/2007  . Depression 01/24/2007  . MYOCARDIAL INFARCTION, HX OF 01/24/2007  . GERD 01/24/2007  . Osteoarthritis 01/24/2007  . COLONIC POLYPS, HX OF 01/24/2007  . IRRITABLE BOWEL SYNDROME, HX OF 01/24/2007    Mason Dibiasio,ANGIE PTA 04/18/2018, 11:08 AM  [address] [place], [place], [address] Phone: [phone]   Fax:  [phone]  Name: Rebecca [name] MRN: [id number] Date of Birth: 06-22-1946

## 2018-04-23 ENCOUNTER — Ambulatory Visit: Payer: Medicare Other | Admitting: Physical Therapy

## 2018-04-23 DIAGNOSIS — M25611 Stiffness of right shoulder, not elsewhere classified: Secondary | ICD-10-CM

## 2018-04-23 DIAGNOSIS — R600 Localized edema: Secondary | ICD-10-CM

## 2018-04-23 DIAGNOSIS — M25511 Pain in right shoulder: Secondary | ICD-10-CM

## 2018-04-23 NOTE — Therapy (Signed)
[facility] [address] [place], [place], [address] Phone: [phone]   Fax:  [phone]  Physical Therapy Treatment  Patient Details  Name: Rebecca [name] MRN: [id number] Date of Birth: 07-25-1945 Referring Provider (PT): RA Theda Sers   Encounter Date: 04/23/2018  PT End of Session - 04/23/18 1123    Visit Number  14    Date for PT Re-Evaluation  05/06/18    PT Start Time  4268    PT Stop Time  1140    PT Time Calculation (min)  49 min       Past Medical History:  Diagnosis Date  . Asthma    extrinsic  . CAD (coronary artery disease)   . Chest pain, atypical   . Diabetes ([place])   . Edema   . Fatigue   . Fatigue   . GERD (gastroesophageal reflux disease)   . Hyperlipidemia   . Hypertension    familiar  . Hypothyroidism   . IBS (irritable bowel syndrome)    hx  . Keratosis, seborrheic   . Labyrinthitis   . MI (myocardial infarction) ([place])    age 27  . Osteoarthritis   . S/P arthroscopy of right shoulder 03/01/2018  . Skin cancer    Melanoma     Past Surgical History:  Procedure Laterality Date  . Breast Biopsy     dense breast tissue  . BREAST EXCISIONAL BIOPSY Left 2000   benign  . CHOLECYSTECTOMY    . EUS  04/04/2012   Procedure: UPPER ENDOSCOPIC ULTRASOUND (EUS) LINEAR;  Surgeon: Milus Banister, MD;  [facility];  Service: Endoscopy;  Laterality: N/A;  radial linear  . KNEE ARTHROPLASTY     total right  02-28-2010 Dr Pilar Plate Aluisio, left  . KNEE ARTHROPLASTY Bilateral 09/12/2017   Hector Shade  . LEFT HEART CATH AND CORONARY ANGIOGRAPHY N/A 04/20/2017   Procedure: LEFT HEART CATH AND CORONARY ANGIOGRAPHY;  Surgeon: Burnell Blanks, MD;  Location: [facility];  Service: Cardiovascular;  Laterality: N/A;  . TUBAL LIGATION      There were no vitals filed for this visit.  Subjective Assessment - 04/23/18 1051    Subjective  seeing MD tomorrow    Currently in Pain?   Yes    Pain Score  3     Pain Location  Shoulder    Pain Orientation  Right         [facility] PT Assessment - 04/23/18 0001      AROM   AROM Assessment Site  Shoulder    Right/Left Shoulder  Right   supine   Right Shoulder Flexion  165 Degrees    Right Shoulder ABduction  170 Degrees    Right Shoulder Internal Rotation  75 Degrees    Right Shoulder External Rotation  55 Degrees      PROM   PROM Assessment Site  Shoulder    Right/Left Shoulder  Right   SUPINE   Right Shoulder Flexion  175 Degrees    Right Shoulder ABduction  178 Degrees    Right Shoulder Internal Rotation  85 Degrees    Right Shoulder External Rotation  80 Degrees                   [facility] Adult PT Treatment/Exercise - 04/23/18 0001      Shoulder Exercises: Standing   Other Standing Exercises  finger ladder 8 times flex and abd  Other Standing Exercises  cane ex 10 reps each way      Shoulder Exercises: ROM/Strengthening   UBE (Upper Arm Bike)  L 1 2 min fwd/ 2 back - Left UE doing most of work)      Vasopneumatic   Number Minutes Vasopneumatic   15 minutes    Vasopnuematic Location   Shoulder    Vasopneumatic Pressure  Medium    Vasopneumatic Temperature   35      Manual Therapy   Manual Therapy  Passive ROM    Passive ROM  RT shld supine all directions               PT Short Term Goals - 03/26/18 1252      PT SHORT TERM GOAL #1   Title  independent with initial HEP    Status  Achieved        PT Long Term Goals - 04/18/18 1105      PT LONG TERM GOAL #1   Title  decrease pain 50% for ADLs    Status  On-going      PT LONG TERM GOAL #2   Title  increase AROM of the right shoulder to 140 degrees flexion    Status  On-going      PT LONG TERM GOAL #3   Title  increase AROM of right shoulder ER to 70 degrees    Status  On-going      PT LONG TERM GOAL #4   Title  reports being able to do hair and dress without pain or limitation    Status  On-going      PT LONG  TERM GOAL #5   Title  put 3# on head high shelf    Status  On-going            Plan - 04/23/18 1123    Clinical Impression Statement  progressing per protocol, continued with GENTLE PREs . excellent [place] dPROM supine    PT Treatment/Interventions  Cryotherapy;Electrical Stimulation;Iontophoresis 4mg /ml Dexamethasone;Moist Heat;Therapeutic activities;Therapeutic exercise;Patient/family education;Manual techniques;Vasopneumatic Device;Passive range of motion    PT Next Visit Plan  Progress AA to Active to PREs- any limitations?????        Patient will benefit from skilled therapeutic intervention in order to improve the following deficits and impairments:  Increased edema, Decreased activity tolerance, Decreased strength, Impaired UE functional use, Pain, Increased muscle spasms, Decreased range of motion, Postural dysfunction  Visit Diagnosis: Stiffness of right shoulder, not elsewhere classified  Acute pain of right shoulder  Localized edema     Problem List Patient Active Problem List   Diagnosis Date Noted  . Osteoporosis 12/25/2017  . Coronary artery disease   . Dysuria 02/17/2017  . Diarrhea 02/17/2017  . Vitamin D deficiency 11/07/2016  . Acute on chronic diastolic CHF (congestive heart failure), NYHA class 1 ([place]) 11/18/2015  . Diabetes mellitus without complication ([place]) 80/99/8338  . Reactive airway disease with acute exacerbation 09/10/2014  . Nonspecific (abnormal) findings on radiological and other examination of gastrointestinal tract 04/04/2012  . Insomnia, idiopathic 10/04/2010  . Hyperlipidemia 09/22/2009  . Essential hypertension 09/22/2009  . CAD, NATIVE VESSEL 09/22/2009  . EDEMA 05/19/2009  . Hypothyroidism 02/23/2009  . EXTRINSIC ASTHMA, UNSPECIFIED 02/10/2009  . KERATOSIS, SEBORRHEIC [place] 04/16/2007  . SKIN CANCER, HX OF 02/14/2007  . Depression 01/24/2007  . MYOCARDIAL INFARCTION, HX OF 01/24/2007  . GERD 01/24/2007  . Osteoarthritis  01/24/2007  . COLONIC POLYPS, HX OF 01/24/2007  . IRRITABLE BOWEL  SYNDROME, HX OF 01/24/2007    PAYSEUR,ANGIE PTA 04/23/2018, 11:25 AM  [facility] [address] [place], [place], [address] Phone: [phone]   Fax:  [phone]  Name: Rebecca [name] MRN: [id number] Date of Birth: 01-05-1946

## 2018-04-25 ENCOUNTER — Ambulatory Visit: Payer: Medicare Other | Admitting: Physical Therapy

## 2018-04-25 DIAGNOSIS — R600 Localized edema: Secondary | ICD-10-CM | POA: Diagnosis not present

## 2018-04-25 DIAGNOSIS — M25511 Pain in right shoulder: Secondary | ICD-10-CM | POA: Diagnosis not present

## 2018-04-25 DIAGNOSIS — M25611 Stiffness of right shoulder, not elsewhere classified: Secondary | ICD-10-CM

## 2018-04-25 NOTE — Therapy (Signed)
[facility] [address] [place], [place], [address] Phone: [phone]   Fax:  [phone]  Physical Therapy Treatment  Patient Details  Name: Rebecca [name] MRN: [id number] Date of Birth: February 27, 1946 Referring Provider (PT): RA Theda Sers   Encounter Date: 04/25/2018  PT End of Session - 04/25/18 1125    Visit Number  15    Date for PT Re-Evaluation  05/06/18    PT Start Time  6761    PT Stop Time  1140    PT Time Calculation (min)  47 min       Past Medical History:  Diagnosis Date  . Asthma    extrinsic  . CAD (coronary artery disease)   . Chest pain, atypical   . Diabetes ([place])   . Edema   . Fatigue   . Fatigue   . GERD (gastroesophageal reflux disease)   . Hyperlipidemia   . Hypertension    familiar  . Hypothyroidism   . IBS (irritable bowel syndrome)    hx  . Keratosis, seborrheic   . Labyrinthitis   . MI (myocardial infarction) ([place])    age 44  . Osteoarthritis   . S/P arthroscopy of right shoulder 03/01/2018  . Skin cancer    Melanoma     Past Surgical History:  Procedure Laterality Date  . Breast Biopsy     dense breast tissue  . BREAST EXCISIONAL BIOPSY Left 2000   benign  . CHOLECYSTECTOMY    . EUS  04/04/2012   Procedure: UPPER ENDOSCOPIC ULTRASOUND (EUS) LINEAR;  Surgeon: Milus Banister, MD;  [facility];  Service: Endoscopy;  Laterality: N/A;  radial linear  . KNEE ARTHROPLASTY     total right  02-28-2010 Dr Pilar Plate Aluisio, left  . KNEE ARTHROPLASTY Bilateral 09/12/2017   Hector Shade  . LEFT HEART CATH AND CORONARY ANGIOGRAPHY N/A 04/20/2017   Procedure: LEFT HEART CATH AND CORONARY ANGIOGRAPHY;  Surgeon: Burnell Blanks, MD;  Location: [facility];  Service: Cardiovascular;  Laterality: N/A;  . TUBAL LIGATION      There were no vitals filed for this visit.  Subjective Assessment - 04/25/18 1056    Subjective  MD very pleased- progress as tolerated.  MD f/u in 6 weeks    Currently in Pain?  Yes    Pain Score  2     Pain Location  Shoulder    Pain Orientation  Right    Pain Descriptors / Indicators  Aching;Stabbing         [facility] PT Assessment - 04/25/18 0001      AROM   AROM Assessment Site  Shoulder    Right/Left Shoulder  Right   STANDING   Right Shoulder Flexion  168 Degrees    Right Shoulder ABduction  141 Degrees    Right Shoulder Internal Rotation  70 Degrees    Right Shoulder External Rotation  60 Degrees                   [facility] Adult PT Treatment/Exercise - 04/25/18 0001      Exercises   Exercises  Shoulder;Neck      Shoulder Exercises: Standing   External Rotation  Strengthening;Right;10 reps;Theraband    Theraband Level (Shoulder External Rotation)  Level 1 (Yellow)    Internal Rotation  Strengthening;Right;12 reps;Theraband    Theraband Level (Shoulder Internal Rotation)  Level 1 (Yellow)    Flexion  Strengthening;10 reps;Theraband;Both  Theraband Level (Shoulder Flexion)  Level 1 (Yellow)    Flexion Limitations  Act flexion -various angles 2 sets 10    ABduction  Strengthening;10 reps;Theraband;Both    Theraband Level (Shoulder ABduction)  Level 1 (Yellow)    Extension  Strengthening;10 reps;Theraband;Both    Theraband Level (Shoulder Extension)  Level 1 (Yellow)    Row  Strengthening;Both;10 reps;Theraband    Theraband Level (Shoulder Row)  Level 1 (Yellow)    Other Standing Exercises  finger ladder 8 times flex and abd   ecc lowering   Other Standing Exercises  cane ex 10 reps each way 2#   AROM cabinet reaching 10 each     Shoulder Exercises: ROM/Strengthening   UBE (Upper Arm Bike)  L 3 3 fwd/3back               PT Short Term Goals - 03/26/18 1252      PT SHORT TERM GOAL #1   Title  independent with initial HEP    Status  Achieved        PT Long Term Goals - 04/18/18 1105      PT LONG TERM GOAL #1   Title  decrease pain 50% for ADLs    Status  On-going      PT  LONG TERM GOAL #2   Title  increase AROM of the right shoulder to 140 degrees flexion    Status  On-going      PT LONG TERM GOAL #3   Title  increase AROM of right shoulder ER to 70 degrees    Status  On-going      PT LONG TERM GOAL #4   Title  reports being able to do hair and dress without pain or limitation    Status  On-going      PT LONG TERM GOAL #5   Title  put 3# on head high shelf    Status  On-going            Plan - 04/25/18 1128    Clinical Impression Statement  initaited initial AROM and scap PREs - pt tolerated extremely well. pt with some pain with abd ( kept ROM below 90 for abd)    PT Treatment/Interventions  Cryotherapy;Electrical Stimulation;Iontophoresis 4mg /ml Dexamethasone;Moist Heat;Therapeutic activities;Therapeutic exercise;Patient/family education;Manual techniques;Vasopneumatic Device;Passive range of motion    PT Next Visit Plan  Progress AA to Active to PREs       Patient will benefit from skilled therapeutic intervention in order to improve the following deficits and impairments:  Increased edema, Decreased activity tolerance, Decreased strength, Impaired UE functional use, Pain, Increased muscle spasms, Decreased range of motion, Postural dysfunction  Visit Diagnosis: Stiffness of right shoulder, not elsewhere classified  Acute pain of right shoulder  Localized edema     Problem List Patient Active Problem List   Diagnosis Date Noted  . Osteoporosis 12/25/2017  . Coronary artery disease   . Dysuria 02/17/2017  . Diarrhea 02/17/2017  . Vitamin D deficiency 11/07/2016  . Acute on chronic diastolic CHF (congestive heart failure), NYHA class 1 ([place]) 11/18/2015  . Diabetes mellitus without complication ([place]) 70/35/0093  . Reactive airway disease with acute exacerbation 09/10/2014  . Nonspecific (abnormal) findings on radiological and other examination of gastrointestinal tract 04/04/2012  . Insomnia, idiopathic 10/04/2010  .  Hyperlipidemia 09/22/2009  . Essential hypertension 09/22/2009  . CAD, NATIVE VESSEL 09/22/2009  . EDEMA 05/19/2009  . Hypothyroidism 02/23/2009  . EXTRINSIC ASTHMA, UNSPECIFIED 02/10/2009  . KERATOSIS, SEBORRHEIC [place]  04/16/2007  . SKIN CANCER, HX OF 02/14/2007  . Depression 01/24/2007  . MYOCARDIAL INFARCTION, HX OF 01/24/2007  . GERD 01/24/2007  . Osteoarthritis 01/24/2007  . COLONIC POLYPS, HX OF 01/24/2007  . IRRITABLE BOWEL SYNDROME, HX OF 01/24/2007    PAYSEUR,ANGIE PTA 04/25/2018, 11:31 AM  [address] [place], [place], [address] Phone: [phone]   Fax:  [phone]  Name: Rebecca [name] MRN: [id number] Date of Birth: January 04, 1946

## 2018-04-30 ENCOUNTER — Ambulatory Visit: Payer: Medicare Other | Admitting: Physical Therapy

## 2018-04-30 DIAGNOSIS — M25511 Pain in right shoulder: Secondary | ICD-10-CM | POA: Diagnosis not present

## 2018-04-30 DIAGNOSIS — R600 Localized edema: Secondary | ICD-10-CM

## 2018-04-30 DIAGNOSIS — M25611 Stiffness of right shoulder, not elsewhere classified: Secondary | ICD-10-CM | POA: Diagnosis not present

## 2018-04-30 NOTE — Therapy (Signed)
[address] [place], [place], [address] Phone: [phone]   Fax:  [phone]  Physical Therapy Treatment  Patient Details  Name: Rebecca [name] MRN: [id number] Date of Birth: [birth year] Referring Provider (PT): RA Theda Sers   Encounter Date: 04/30/2018  PT End of Session - 04/30/18 1221    Visit Number  16    Date for PT Re-Evaluation  05/06/18    PT Start Time  3790    PT Stop Time  2409    PT Time Calculation (min)  50 min       Past Medical History:  Diagnosis Date  . Asthma    extrinsic  . CAD (coronary artery disease)   . Chest pain, atypical   . Diabetes ([place])   . Edema   . Fatigue   . Fatigue   . GERD (gastroesophageal reflux disease)   . Hyperlipidemia   . Hypertension    familiar  . Hypothyroidism   . IBS (irritable bowel syndrome)    hx  . Keratosis, seborrheic   . Labyrinthitis   . MI (myocardial infarction) ([place])    age 72  . Osteoarthritis   . S/P arthroscopy of right shoulder 03/01/2018  . Skin cancer    Melanoma     Past Surgical History:  Procedure Laterality Date  . Breast Biopsy     dense breast tissue  . BREAST EXCISIONAL BIOPSY Left 2000   benign  . CHOLECYSTECTOMY    . EUS  04/04/2012   Procedure: UPPER ENDOSCOPIC ULTRASOUND (EUS) LINEAR;  Surgeon: Milus Banister, MD;  [facility];  Service: Endoscopy;  Laterality: N/A;  radial linear  . KNEE ARTHROPLASTY     total right  02-28-2010 Dr Pilar Plate Aluisio, left  . KNEE ARTHROPLASTY Bilateral 09/12/2017   Hector Shade  . LEFT HEART CATH AND CORONARY ANGIOGRAPHY N/A 04/20/2017   Procedure: LEFT HEART CATH AND CORONARY ANGIOGRAPHY;  Surgeon: Burnell Blanks, MD;  Location: [facility];  Service: Cardiovascular;  Laterality: N/A;  . TUBAL LIGATION      There were no vitals filed for this visit.  Subjective Assessment - 04/30/18 1150    Subjective  hurt yesterday, achey today. okay after  exercise. using arm alot more    Currently in Pain?  Yes    Pain Score  2     Pain Location  Shoulder    Pain Orientation  Right                       [facility] Adult PT Treatment/Exercise - 04/30/18 0001      Shoulder Exercises: Standing   External Rotation  Strengthening;Right;Theraband;15 reps    Theraband Level (Shoulder External Rotation)  Level 1 (Yellow)    Internal Rotation  Strengthening;Right;Theraband;15 reps    Theraband Level (Shoulder Internal Rotation)  Level 1 (Yellow)    Extension  Strengthening;Theraband;Both;15 reps    Theraband Level (Shoulder Extension)  Level 1 (Yellow)    Row  Strengthening;Both;Theraband;15 reps    Theraband Level (Shoulder Row)  Level 1 (Yellow)    Other Standing Exercises  finger ladder 8 times flex and abd   ecc lowering. IR/ER 10   Other Standing Exercises  cane ex 15 reps each way 2#   2# 1 level cabinet flex and abd 10x, 0# 10 2 level no wt     Shoulder Exercises: ROM/Strengthening   UBE (Upper Arm Bike)  L 3 3 fwd/3back      Vasopneumatic   Number Minutes Vasopneumatic   15 minutes    Vasopnuematic Location   Shoulder    Vasopneumatic Pressure  Medium    Vasopneumatic Temperature   35      Manual Therapy   Manual Therapy  Joint mobilization;Passive ROM    Joint Mobilization  capsule stretch    Passive ROM  RT shld seated all directions             PT Education - 04/30/18 1221    Education provided  Yes    Education Details  scap stab yellow tband    Person(s) Educated  Patient    Methods  Explanation;Demonstration;Handout    Comprehension  Returned demonstration       PT Short Term Goals - 03/26/18 1252      PT SHORT TERM GOAL #1   Title  independent with initial HEP    Status  Achieved        PT Long Term Goals - 04/18/18 1105      PT LONG TERM GOAL #1   Title  decrease pain 50% for ADLs    Status  On-going      PT LONG TERM GOAL #2   Title  increase AROM of the right shoulder to 140  degrees flexion    Status  On-going      PT LONG TERM GOAL #3   Title  increase AROM of right shoulder ER to 70 degrees    Status  On-going      PT LONG TERM GOAL #4   Title  reports being able to do hair and dress without pain or limitation    Status  On-going      PT LONG TERM GOAL #5   Title  put 3# on head high shelf    Status  On-going            Plan - 04/30/18 1221    Clinical Impression Statement  progressing slowly AROM and PREs and pt is tolerating well    PT Treatment/Interventions  Cryotherapy;Electrical Stimulation;Iontophoresis 4mg /ml Dexamethasone;Moist Heat;Therapeutic activities;Therapeutic exercise;Patient/family education;Manual techniques;Vasopneumatic Device;Passive range of motion    PT Next Visit Plan  Progress AA to Active to PREs       Patient will benefit from skilled therapeutic intervention in order to improve the following deficits and impairments:  Increased edema, Decreased activity tolerance, Decreased strength, Impaired UE functional use, Pain, Increased muscle spasms, Decreased range of motion, Postural dysfunction  Visit Diagnosis: Stiffness of right shoulder, not elsewhere classified  Acute pain of right shoulder  Localized edema     Problem List Patient Active Problem List   Diagnosis Date Noted  . Osteoporosis 12/25/2017  . Coronary artery disease   . Dysuria 02/17/2017  . Diarrhea 02/17/2017  . Vitamin D deficiency 11/07/2016  . Acute on chronic diastolic CHF (congestive heart failure), NYHA class 1 ([place]) 11/18/2015  . Diabetes mellitus without complication ([place]) 40/98/1191  . Reactive airway disease with acute exacerbation 09/10/2014  . Nonspecific (abnormal) findings on radiological and other examination of gastrointestinal tract 04/04/2012  . Insomnia, idiopathic 10/04/2010  . Hyperlipidemia 09/22/2009  . Essential hypertension 09/22/2009  . CAD, NATIVE VESSEL 09/22/2009  . EDEMA 05/19/2009  . Hypothyroidism  02/23/2009  . EXTRINSIC ASTHMA, UNSPECIFIED 02/10/2009  . KERATOSIS, SEBORRHEIC [place] 04/16/2007  . SKIN CANCER, HX OF 02/14/2007  . Depression 01/24/2007  . MYOCARDIAL INFARCTION, HX OF 01/24/2007  . GERD 01/24/2007  .  Osteoarthritis 01/24/2007  . COLONIC POLYPS, HX OF 01/24/2007  . IRRITABLE BOWEL SYNDROME, HX OF 01/24/2007    Mikyah Alamo,ANGIE PTA 04/30/2018, 12:22 PM  [address] [place], [place], [address] Phone: [phone]   Fax:  [phone]  Name: NAMRATA DANGLER MRN: [id number] Date of Birth: [birth year]

## 2018-05-02 ENCOUNTER — Ambulatory Visit: Payer: Medicare Other | Admitting: Physical Therapy

## 2018-05-02 DIAGNOSIS — M25511 Pain in right shoulder: Secondary | ICD-10-CM

## 2018-05-02 DIAGNOSIS — M25611 Stiffness of right shoulder, not elsewhere classified: Secondary | ICD-10-CM

## 2018-05-02 DIAGNOSIS — R600 Localized edema: Secondary | ICD-10-CM | POA: Diagnosis not present

## 2018-05-02 NOTE — Therapy (Signed)
[address] [place], [place], [address] Phone: [phone]   Fax:  [phone]  Physical Therapy Treatment  Patient Details  Name: Rebecca [name] MRN: [id number] Date of Birth: Apr 09, 1946 Referring Provider (PT): RA Theda Sers   Encounter Date: 05/02/2018  PT End of Session - 05/02/18 1523    Visit Number  17    Date for PT Re-Evaluation  05/06/18    PT Start Time  1430    PT Stop Time  1530    PT Time Calculation (min)  60 min       Past Medical History:  Diagnosis Date  . Asthma    extrinsic  . CAD (coronary artery disease)   . Chest pain, atypical   . Diabetes ([place])   . Edema   . Fatigue   . Fatigue   . GERD (gastroesophageal reflux disease)   . Hyperlipidemia   . Hypertension    familiar  . Hypothyroidism   . IBS (irritable bowel syndrome)    hx  . Keratosis, seborrheic   . Labyrinthitis   . MI (myocardial infarction) ([place])    age 72  . Osteoarthritis   . S/P arthroscopy of right shoulder 03/01/2018  . Skin cancer    Melanoma     Past Surgical History:  Procedure Laterality Date  . Breast Biopsy     dense breast tissue  . BREAST EXCISIONAL BIOPSY Left 2000   benign  . CHOLECYSTECTOMY    . EUS  04/04/2012   Procedure: UPPER ENDOSCOPIC ULTRASOUND (EUS) LINEAR;  Surgeon: Milus Banister, MD;  [facility];  Service: Endoscopy;  Laterality: N/A;  radial linear  . KNEE ARTHROPLASTY     total right  02-28-2010 Dr Pilar Plate Aluisio, left  . KNEE ARTHROPLASTY Bilateral 09/12/2017   Hector Shade  . LEFT HEART CATH AND CORONARY ANGIOGRAPHY N/A 04/20/2017   Procedure: LEFT HEART CATH AND CORONARY ANGIOGRAPHY;  Surgeon: Burnell Blanks, MD;  Location: [facility];  Service: Cardiovascular;  Laterality: N/A;  . TUBAL LIGATION      There were no vitals filed for this visit.  Subjective Assessment - 05/02/18 1455    Subjective  very sore-appropriate soreness    Currently in Pain?  Yes    Pain Score  4          [facility] PT Assessment - 05/02/18 0001      AROM   AROM Assessment Site  Shoulder    Right/Left Shoulder  Right   standing   Right Shoulder Flexion  170 Degrees    Right Shoulder ABduction  148 Degrees    Right Shoulder Internal Rotation  72 Degrees    Right Shoulder External Rotation  61 Degrees      PROM   PROM Assessment Site  --    Right/Left Shoulder  --                   [facility] Adult PT Treatment/Exercise - 05/02/18 0001      Exercises   Exercises  Shoulder;Neck      Shoulder Exercises: Sidelying   External Rotation  Strengthening;Right;10 reps;Weights    External Rotation Weight (lbs)  2    ABduction  Strengthening;Right;10 reps;Weights    ABduction Weight (lbs)  2      Shoulder Exercises: Standing   External Rotation  Strengthening;Right;10 reps;Weights    External Rotation Weight (lbs)  2    Flexion  Strengthening;Right;10 reps;Weights    Shoulder Flexion Weight (lbs)  2    Flexion Limitations  Act flexion -various angles 2 sets 10   1.5#   Other Standing Exercises  finger ladder 8 times flex and abd   1.5# ecc lowering   Other Standing Exercises  cane ex 15 reps each way 3#   wall angels 10 times     Shoulder Exercises: ROM/Strengthening   UBE (Upper Arm Bike)  L 3 3 fwd/3back      Vasopneumatic   Number Minutes Vasopneumatic   15 minutes    Vasopnuematic Location   Shoulder    Vasopneumatic Pressure  Medium    Vasopneumatic Temperature   35      Manual Therapy   Passive ROM  Rt shld supine all directions               PT Short Term Goals - 03/26/18 1252      PT SHORT TERM GOAL #1   Title  independent with initial HEP    Status  Achieved        PT Long Term Goals - 04/18/18 1105      PT LONG TERM GOAL #1   Title  decrease pain 50% for ADLs    Status  On-going      PT LONG TERM GOAL #2   Title  increase AROM of the right shoulder to 140 degrees flexion    Status   On-going      PT LONG TERM GOAL #3   Title  increase AROM of right shoulder ER to 70 degrees    Status  On-going      PT LONG TERM GOAL #4   Title  reports being able to do hair and dress without pain or limitation    Status  On-going      PT LONG TERM GOAL #5   Title  put 3# on head high shelf    Status  On-going            Plan - 05/02/18 1523    Clinical Impression Statement  pt makeing progress with AROm and func strength- progressing slowly into PREs. progressing towards goals    PT Treatment/Interventions  Cryotherapy;Electrical Stimulation;Iontophoresis 4mg /ml Dexamethasone;Moist Heat;Therapeutic activities;Therapeutic exercise;Patient/family education;Manual techniques;Vasopneumatic Device;Passive range of motion    PT Next Visit Plan  Progress Active ROM to PREs       Patient will benefit from skilled therapeutic intervention in order to improve the following deficits and impairments:  Increased edema, Decreased activity tolerance, Decreased strength, Impaired UE functional use, Pain, Increased muscle spasms, Decreased range of motion, Postural dysfunction  Visit Diagnosis: Stiffness of right shoulder, not elsewhere classified  Acute pain of right shoulder  Localized edema     Problem List Patient Active Problem List   Diagnosis Date Noted  . Osteoporosis 12/25/2017  . Coronary artery disease   . Dysuria 02/17/2017  . Diarrhea 02/17/2017  . Vitamin D deficiency 11/07/2016  . Acute on chronic diastolic CHF (congestive heart failure), NYHA class 1 ([place]) 11/18/2015  . Diabetes mellitus without complication ([place]) 29/79/8921  . Reactive airway disease with acute exacerbation 09/10/2014  . Nonspecific (abnormal) findings on radiological and other examination of gastrointestinal tract 04/04/2012  . Insomnia, idiopathic 10/04/2010  . Hyperlipidemia 09/22/2009  . Essential hypertension 09/22/2009  . CAD, NATIVE VESSEL 09/22/2009  . EDEMA 05/19/2009  .  Hypothyroidism 02/23/2009  . EXTRINSIC ASTHMA, UNSPECIFIED 02/10/2009  . KERATOSIS, SEBORRHEIC [place] 04/16/2007  . SKIN  CANCER, HX OF 02/14/2007  . Depression 01/24/2007  . MYOCARDIAL INFARCTION, HX OF 01/24/2007  . GERD 01/24/2007  . Osteoarthritis 01/24/2007  . COLONIC POLYPS, HX OF 01/24/2007  . IRRITABLE BOWEL SYNDROME, HX OF 01/24/2007    PAYSEUR,ANGIE PTA 05/02/2018, 3:25 PM  [address] [place], [place], [address] Phone: [phone]   Fax:  [phone]  Name: SHONDA MANDARINO MRN: [id number] Date of Birth: [birth year]

## 2018-05-07 ENCOUNTER — Other Ambulatory Visit: Payer: Self-pay | Admitting: Family Medicine

## 2018-05-07 ENCOUNTER — Ambulatory Visit: Payer: Medicare Other | Admitting: Physical Therapy

## 2018-05-07 DIAGNOSIS — M25511 Pain in right shoulder: Secondary | ICD-10-CM

## 2018-05-07 DIAGNOSIS — R600 Localized edema: Secondary | ICD-10-CM | POA: Diagnosis not present

## 2018-05-07 DIAGNOSIS — M25611 Stiffness of right shoulder, not elsewhere classified: Secondary | ICD-10-CM | POA: Diagnosis not present

## 2018-05-07 NOTE — Therapy (Signed)
[address] [place], [place], [address] Phone: [phone]   Fax:  [phone]  Physical Therapy Treatment  Patient Details  Name: Rebecca [name] MRN: [id number] Date of Birth: [birth year] Referring Provider (PT): RA Theda Sers   Encounter Date: 05/07/2018  PT End of Session - 05/07/18 1514    Visit Number  18    Date for PT Re-Evaluation  05/06/18    PT Start Time  1430    PT Stop Time  1530    PT Time Calculation (min)  60 min       Past Medical History:  Diagnosis Date  . Asthma    extrinsic  . CAD (coronary artery disease)   . Chest pain, atypical   . Diabetes ([place])   . Edema   . Fatigue   . Fatigue   . GERD (gastroesophageal reflux disease)   . Hyperlipidemia   . Hypertension    familiar  . Hypothyroidism   . IBS (irritable bowel syndrome)    hx  . Keratosis, seborrheic   . Labyrinthitis   . MI (myocardial infarction) ([place])    age 62  . Osteoarthritis   . S/P arthroscopy of right shoulder 03/01/2018  . Skin cancer    Melanoma     Past Surgical History:  Procedure Laterality Date  . Breast Biopsy     dense breast tissue  . BREAST EXCISIONAL BIOPSY Left 2000   benign  . CHOLECYSTECTOMY    . EUS  04/04/2012   Procedure: UPPER ENDOSCOPIC ULTRASOUND (EUS) LINEAR;  Surgeon: Milus Banister, MD;  [facility];  Service: Endoscopy;  Laterality: N/A;  radial linear  . KNEE ARTHROPLASTY     total right  02-28-2010 Dr Pilar Plate Aluisio, left  . KNEE ARTHROPLASTY Bilateral 09/12/2017   Hector Shade  . LEFT HEART CATH AND CORONARY ANGIOGRAPHY N/A 04/20/2017   Procedure: LEFT HEART CATH AND CORONARY ANGIOGRAPHY;  Surgeon: Burnell Blanks, MD;  Location: [facility];  Service: Cardiovascular;  Laterality: N/A;  . TUBAL LIGATION      There were no vitals filed for this visit.  Subjective Assessment - 05/07/18 1435    Subjective  doing really well    Currently in Pain?   Yes    Pain Score  2     Pain Location  Shoulder    Pain Orientation  Right                       [facility] Adult PT Treatment/Exercise - 05/07/18 0001      Shoulder Exercises: Sidelying   External Rotation  Strengthening;Right;Weights;20 reps    External Rotation Weight (lbs)  3    ABduction  Strengthening;Right;Weights;20 reps    ABduction Weight (lbs)  3      Shoulder Exercises: Standing   Other Standing Exercises  ball vs wall 5 CW and CCW   red tband scap stab on wall   Other Standing Exercises  cane ex 15 reps each way 3#   2 # standing free wt 10 each,3# bicep curl     Shoulder Exercises: ROM/Strengthening   UBE (Upper Arm Bike)  L 4 3 fwd/3back      Vasopneumatic   Number Minutes Vasopneumatic   15 minutes    Vasopnuematic Location   Shoulder    Vasopneumatic Pressure  Medium    Vasopneumatic Temperature   35      Manual  Therapy   Passive ROM  Rt shld supine all directions               PT Short Term Goals - 03/26/18 1252      PT SHORT TERM GOAL #1   Title  independent with initial HEP    Status  Achieved        PT Long Term Goals - 04/18/18 1105      PT LONG TERM GOAL #1   Title  decrease pain 50% for ADLs    Status  On-going      PT LONG TERM GOAL #2   Title  increase AROM of the right shoulder to 140 degrees flexion    Status  On-going      PT LONG TERM GOAL #3   Title  increase AROM of right shoulder ER to 70 degrees    Status  On-going      PT LONG TERM GOAL #4   Title  reports being able to do hair and dress without pain or limitation    Status  On-going      PT LONG TERM GOAL #5   Title  put 3# on head high shelf    Status  On-going            Plan - 05/07/18 1515    Clinical Impression Statement  pt progressing very well with in protocol. func weakness is biggest issue    PT Treatment/Interventions  Cryotherapy;Electrical Stimulation;Iontophoresis 4mg /ml Dexamethasone;Moist Heat;Therapeutic  activities;Therapeutic exercise;Patient/family education;Manual techniques;Vasopneumatic Device;Passive range of motion    PT Next Visit Plan  Progress Active ROM to PREs       Patient will benefit from skilled therapeutic intervention in order to improve the following deficits and impairments:  Increased edema, Decreased activity tolerance, Decreased strength, Impaired UE functional use, Pain, Increased muscle spasms, Decreased range of motion, Postural dysfunction  Visit Diagnosis: Stiffness of right shoulder, not elsewhere classified  Localized edema  Acute pain of right shoulder     Problem List Patient Active Problem List   Diagnosis Date Noted  . Osteoporosis 12/25/2017  . Coronary artery disease   . Dysuria 02/17/2017  . Diarrhea 02/17/2017  . Vitamin D deficiency 11/07/2016  . Acute on chronic diastolic CHF (congestive heart failure), NYHA class 1 ([place]) 11/18/2015  . Diabetes mellitus without complication ([place]) 46/27/0350  . Reactive airway disease with acute exacerbation 09/10/2014  . Nonspecific (abnormal) findings on radiological and other examination of gastrointestinal tract 04/04/2012  . Insomnia, idiopathic 10/04/2010  . Hyperlipidemia 09/22/2009  . Essential hypertension 09/22/2009  . CAD, NATIVE VESSEL 09/22/2009  . EDEMA 05/19/2009  . Hypothyroidism 02/23/2009  . EXTRINSIC ASTHMA, UNSPECIFIED 02/10/2009  . KERATOSIS, SEBORRHEIC [place] 04/16/2007  . SKIN CANCER, HX OF 02/14/2007  . Depression 01/24/2007  . MYOCARDIAL INFARCTION, HX OF 01/24/2007  . GERD 01/24/2007  . Osteoarthritis 01/24/2007  . COLONIC POLYPS, HX OF 01/24/2007  . IRRITABLE BOWEL SYNDROME, HX OF 01/24/2007    Chantay Whitelock,ANGIE PTA 05/07/2018, 3:16 PM  [address] [place], [place], [address] Phone: [phone]   Fax:  [phone]  Name: ORION VANDERVORT MRN: [id number] Date of Birth: 12-Dec-1945

## 2018-05-09 ENCOUNTER — Ambulatory Visit: Payer: Medicare Other | Admitting: Physical Therapy

## 2018-05-09 DIAGNOSIS — M25511 Pain in right shoulder: Secondary | ICD-10-CM

## 2018-05-09 DIAGNOSIS — R600 Localized edema: Secondary | ICD-10-CM

## 2018-05-09 DIAGNOSIS — M25611 Stiffness of right shoulder, not elsewhere classified: Secondary | ICD-10-CM | POA: Diagnosis not present

## 2018-05-09 NOTE — Therapy (Signed)
[address] [place], [place], [address] Phone: [phone]   Fax:  [phone]  Physical Therapy Treatment  Patient Details  Name: Rebecca [name] MRN: [id number] Date of Birth: 10/28/1945 Referring Provider (PT): RA Theda Sers   Encounter Date: 05/09/2018  PT End of Session - 05/09/18 1218    Visit Number  19    Date for PT Re-Evaluation  06/07/18    PT Start Time  1135    PT Stop Time  1230    PT Time Calculation (min)  55 min       Past Medical History:  Diagnosis Date  . Asthma    extrinsic  . CAD (coronary artery disease)   . Chest pain, atypical   . Diabetes ([place])   . Edema   . Fatigue   . Fatigue   . GERD (gastroesophageal reflux disease)   . Hyperlipidemia   . Hypertension    familiar  . Hypothyroidism   . IBS (irritable bowel syndrome)    hx  . Keratosis, seborrheic   . Labyrinthitis   . MI (myocardial infarction) ([place])    age 15  . Osteoarthritis   . S/P arthroscopy of right shoulder 03/01/2018  . Skin cancer    Melanoma     Past Surgical History:  Procedure Laterality Date  . Breast Biopsy     dense breast tissue  . BREAST EXCISIONAL BIOPSY Left 2000   benign  . CHOLECYSTECTOMY    . EUS  04/04/2012   Procedure: UPPER ENDOSCOPIC ULTRASOUND (EUS) LINEAR;  Surgeon: Milus Banister, MD;  [facility];  Service: Endoscopy;  Laterality: N/A;  radial linear  . KNEE ARTHROPLASTY     total right  02-28-2010 Dr Pilar Plate Aluisio, left  . KNEE ARTHROPLASTY Bilateral 09/12/2017   Hector Shade  . LEFT HEART CATH AND CORONARY ANGIOGRAPHY N/A 04/20/2017   Procedure: LEFT HEART CATH AND CORONARY ANGIOGRAPHY;  Surgeon: Burnell Blanks, MD;  Location: [facility];  Service: Cardiovascular;  Laterality: N/A;  . TUBAL LIGATION      There were no vitals filed for this visit.  Subjective Assessment - 05/09/18 1140    Subjective  alot of cooking yesterday- achey and  muscle fatigue esp in [place]    Currently in Pain?  Yes    Pain Score  2     Pain Location  Shoulder    Pain Orientation  Right                       [facility] Adult PT Treatment/Exercise - 05/09/18 0001      Exercises   Exercises  Shoulder;Neck      Shoulder Exercises: Standing   Other Standing Exercises  3# ecc finger ladder 8 x flex and abd   ball vs wall   Other Standing Exercises  4# shruggs and rolls 15 each   3# bicep curl 15 2 sets. 1# PNF,ER and func reaching     Shoulder Exercises: ROM/Strengthening   UBE (Upper Arm Bike)  L 4 3 fwd/3back    Lat Pull  20 reps   15#   Cybex Row  20 reps   15#     Vasopneumatic   Number Minutes Vasopneumatic   15 minutes    Vasopnuematic Location   Shoulder    Vasopneumatic Pressure  Medium    Vasopneumatic Temperature   35      Manual  Therapy   Passive ROM  Rt shld supine all directions               PT Short Term Goals - 03/26/18 1252      PT SHORT TERM GOAL #1   Title  independent with initial HEP    Status  Achieved        PT Long Term Goals - 04/18/18 1105      PT LONG TERM GOAL #1   Title  decrease pain 50% for ADLs    Status  On-going      PT LONG TERM GOAL #2   Title  increase AROM of the right shoulder to 140 degrees flexion    Status  On-going      PT LONG TERM GOAL #3   Title  increase AROM of right shoulder ER to 70 degrees    Status  On-going      PT LONG TERM GOAL #4   Title  reports being able to do hair and dress without pain or limitation    Status  On-going      PT LONG TERM GOAL #5   Title  put 3# on head high shelf    Status  On-going            Plan - 05/09/18 1218    Clinical Impression Statement  progresing consistantly iwth increased motion ans strength. able ot do abd and ER in standing today which she was unabel to do last week    PT Treatment/Interventions  Cryotherapy;Electrical Stimulation;Iontophoresis 4mg /ml Dexamethasone;Moist Heat;Therapeutic  activities;Therapeutic exercise;Patient/family education;Manual techniques;Vasopneumatic Device;Passive range of motion    PT Next Visit Plan  Progress Active ROM to PREs       Patient will benefit from skilled therapeutic intervention in order to improve the following deficits and impairments:  Increased edema, Decreased activity tolerance, Decreased strength, Impaired UE functional use, Pain, Increased muscle spasms, Decreased range of motion, Postural dysfunction  Visit Diagnosis: Stiffness of right shoulder, not elsewhere classified  Localized edema  Acute pain of right shoulder     Problem List Patient Active Problem List   Diagnosis Date Noted  . Osteoporosis 12/25/2017  . Coronary artery disease   . Dysuria 02/17/2017  . Diarrhea 02/17/2017  . Vitamin D deficiency 11/07/2016  . Acute on chronic diastolic CHF (congestive heart failure), NYHA class 1 ([place]) 11/18/2015  . Diabetes mellitus without complication ([place]) 62/83/6629  . Reactive airway disease with acute exacerbation 09/10/2014  . Nonspecific (abnormal) findings on radiological and other examination of gastrointestinal tract 04/04/2012  . Insomnia, idiopathic 10/04/2010  . Hyperlipidemia 09/22/2009  . Essential hypertension 09/22/2009  . CAD, NATIVE VESSEL 09/22/2009  . EDEMA 05/19/2009  . Hypothyroidism 02/23/2009  . EXTRINSIC ASTHMA, UNSPECIFIED 02/10/2009  . KERATOSIS, SEBORRHEIC [place] 04/16/2007  . SKIN CANCER, HX OF 02/14/2007  . Depression 01/24/2007  . MYOCARDIAL INFARCTION, HX OF 01/24/2007  . GERD 01/24/2007  . Osteoarthritis 01/24/2007  . COLONIC POLYPS, HX OF 01/24/2007  . IRRITABLE BOWEL SYNDROME, HX OF 01/24/2007    Kema Santaella,ANGIE PTA 05/09/2018, 12:20 PM  [address] [place], [place], [address] Phone: [phone]   Fax:  [phone]  Name: ATIYAH BAUER MRN: [id number] Date of Birth: 1945-12-24

## 2018-05-14 ENCOUNTER — Ambulatory Visit: Payer: Medicare Other | Admitting: Physical Therapy

## 2018-05-14 DIAGNOSIS — R600 Localized edema: Secondary | ICD-10-CM | POA: Diagnosis not present

## 2018-05-14 DIAGNOSIS — M25511 Pain in right shoulder: Secondary | ICD-10-CM | POA: Diagnosis not present

## 2018-05-14 DIAGNOSIS — M25611 Stiffness of right shoulder, not elsewhere classified: Secondary | ICD-10-CM

## 2018-05-14 NOTE — Therapy (Signed)
[address] [place], [place], [address] Phone: [phone]   Fax:  [phone] Progress Note Reporting Period 04/12/18 to 05/14/18 for visits visits 11-20  See note below for Objective Data and Assessment of Progress/Goals.      Physical Therapy Treatment  Patient Details  Name: Rebecca [name] MRN: [id number] Date of Birth: 03-Jan-1946 Referring Provider (PT): RA Theda Sers   Encounter Date: 05/14/2018  PT End of Session - 05/14/18 1501    Visit Number  20    Date for PT Re-Evaluation  06/07/18    PT Start Time  1135    PT Stop Time  1230    PT Time Calculation (min)  55 min       Past Medical History:  Diagnosis Date  . Asthma    extrinsic  . CAD (coronary artery disease)   . Chest pain, atypical   . Diabetes ([place])   . Edema   . Fatigue   . Fatigue   . GERD (gastroesophageal reflux disease)   . Hyperlipidemia   . Hypertension    familiar  . Hypothyroidism   . IBS (irritable bowel syndrome)    hx  . Keratosis, seborrheic   . Labyrinthitis   . MI (myocardial infarction) ([place])    age 73  . Osteoarthritis   . S/P arthroscopy of right shoulder 03/01/2018  . Skin cancer    Melanoma     Past Surgical History:  Procedure Laterality Date  . Breast Biopsy     dense breast tissue  . BREAST EXCISIONAL BIOPSY Left 2000   benign  . CHOLECYSTECTOMY    . EUS  04/04/2012   Procedure: UPPER ENDOSCOPIC ULTRASOUND (EUS) LINEAR;  Surgeon: Milus Banister, MD;  [facility];  Service: Endoscopy;  Laterality: N/A;  radial linear  . KNEE ARTHROPLASTY     total right  02-28-2010 Dr Pilar Plate Aluisio, left  . KNEE ARTHROPLASTY Bilateral 09/12/2017   Hector Shade  . LEFT HEART CATH AND CORONARY ANGIOGRAPHY N/A 04/20/2017   Procedure: LEFT HEART CATH AND CORONARY ANGIOGRAPHY;  Surgeon: Burnell Blanks, MD;  Location: [facility];  Service: Cardiovascular;  Laterality: N/A;  . TUBAL  LIGATION      There were no vitals filed for this visit.  Subjective Assessment - 05/14/18 1457    Subjective  okay but diffiuclty sleeping and getting comfortable    Currently in Pain?  Yes    Pain Score  3     Pain Location  Shoulder                       [facility] Adult PT Treatment/Exercise - 05/14/18 0001      Shoulder Exercises: Standing   Other Standing Exercises  5# shruggs and rolls 15 each   5# bicep curls     Shoulder Exercises: ROM/Strengthening   UBE (Upper Arm Bike)  L 4 3 fwd/3back    Other ROM/Strengthening Exercises  pulleys scap stab and IR/ER    Other ROM/Strengthening Exercises  wall stab with and without tband      Vasopneumatic   Number Minutes Vasopneumatic   15 minutes    Vasopnuematic Location   Shoulder    Vasopneumatic Pressure  Medium    Vasopneumatic Temperature   35       Trigger Point Dry Needling - 05/14/18 1251    Consent Given?  Yes    Muscles Treated  Upper Body  Rhomboids    Upper Trapezius Response  Twitch reponse elicited    Rhomboids Response  Twitch response elicited             PT Short Term Goals - 03/26/18 1252      PT SHORT TERM GOAL #1   Title  independent with initial HEP    Status  Achieved        PT Long Term Goals - 04/18/18 1105      PT LONG TERM GOAL #1   Title  decrease pain 50% for ADLs    Status  On-going      PT LONG TERM GOAL #2   Title  increase AROM of the right shoulder to 140 degrees flexion    Status  On-going      PT LONG TERM GOAL #3   Title  increase AROM of right shoulder ER to 70 degrees    Status  On-going      PT LONG TERM GOAL #4   Title  reports being able to do hair and dress without pain or limitation    Status  On-going      PT LONG TERM GOAL #5   Title  put 3# on head high shelf    Status  On-going            Plan - 05/14/18 1502    Clinical Impression Statement  pt continues to have increased strength but did need cuing to decreased trap overuse  with ex today.    PT Treatment/Interventions  Cryotherapy;Electrical Stimulation;Iontophoresis 4mg /ml Dexamethasone;Moist Heat;Therapeutic activities;Therapeutic exercise;Patient/family education;Manual techniques;Vasopneumatic Device;Passive range of motion    PT Next Visit Plan  PREs       Patient will benefit from skilled therapeutic intervention in order to improve the following deficits and impairments:  Increased edema, Decreased activity tolerance, Decreased strength, Impaired UE functional use, Pain, Increased muscle spasms, Decreased range of motion, Postural dysfunction  Visit Diagnosis: Stiffness of right shoulder, not elsewhere classified  Localized edema  Acute pain of right shoulder     Problem List Patient Active Problem List   Diagnosis Date Noted  . Osteoporosis 12/25/2017  . Coronary artery disease   . Dysuria 02/17/2017  . Diarrhea 02/17/2017  . Vitamin D deficiency 11/07/2016  . Acute on chronic diastolic CHF (congestive heart failure), NYHA class 1 ([place]) 11/18/2015  . Diabetes mellitus without complication ([place]) 00/86/7619  . Reactive airway disease with acute exacerbation 09/10/2014  . Nonspecific (abnormal) findings on radiological and other examination of gastrointestinal tract 04/04/2012  . Insomnia, idiopathic 10/04/2010  . Hyperlipidemia 09/22/2009  . Essential hypertension 09/22/2009  . CAD, NATIVE VESSEL 09/22/2009  . EDEMA 05/19/2009  . Hypothyroidism 02/23/2009  . EXTRINSIC ASTHMA, UNSPECIFIED 02/10/2009  . KERATOSIS, SEBORRHEIC [place] 04/16/2007  . SKIN CANCER, HX OF 02/14/2007  . Depression 01/24/2007  . MYOCARDIAL INFARCTION, HX OF 01/24/2007  . GERD 01/24/2007  . Osteoarthritis 01/24/2007  . COLONIC POLYPS, HX OF 01/24/2007  . IRRITABLE BOWEL SYNDROME, HX OF 01/24/2007    Alannis Hsia,ANGIE  PTA 05/14/2018, 3:03 PM  [address] [place], [place], [address] Phone:  [phone]   Fax:  [phone]  Name: Rebecca [name] MRN: [id number] Date of Birth: 01-20-1946

## 2018-05-16 ENCOUNTER — Ambulatory Visit: Payer: Medicare Other | Admitting: Physical Therapy

## 2018-05-16 DIAGNOSIS — R600 Localized edema: Secondary | ICD-10-CM

## 2018-05-16 DIAGNOSIS — M25511 Pain in right shoulder: Secondary | ICD-10-CM

## 2018-05-16 DIAGNOSIS — M25611 Stiffness of right shoulder, not elsewhere classified: Secondary | ICD-10-CM | POA: Diagnosis not present

## 2018-05-16 NOTE — Therapy (Signed)
[facility] [place] [address] [place], [place], [address] Phone: [phone]   Fax:  [phone]  Physical Therapy Treatment  Patient Details  Name: Rebecca [name] MRN: [id number] Date of Birth: October 31, 1945 Referring Provider (PT): RA Theda Sers   Encounter Date: 05/16/2018  PT End of Session - 05/16/18 1252    Visit Number  21    Date for PT Re-Evaluation  06/07/18    PT Start Time  5462    PT Stop Time  1230    PT Time Calculation (min)  55 min       Past Medical History:  Diagnosis Date  . Asthma    extrinsic  . CAD (coronary artery disease)   . Chest pain, atypical   . Diabetes ([place])   . Edema   . Fatigue   . Fatigue   . GERD (gastroesophageal reflux disease)   . Hyperlipidemia   . Hypertension    familiar  . Hypothyroidism   . IBS (irritable bowel syndrome)    hx  . Keratosis, seborrheic   . Labyrinthitis   . MI (myocardial infarction) ([place])    age 54  . Osteoarthritis   . S/P arthroscopy of right shoulder 03/01/2018  . Skin cancer    Melanoma     Past Surgical History:  Procedure Laterality Date  . Breast Biopsy     dense breast tissue  . BREAST EXCISIONAL BIOPSY Left 2000   benign  . CHOLECYSTECTOMY    . EUS  04/04/2012   Procedure: UPPER ENDOSCOPIC ULTRASOUND (EUS) LINEAR;  Surgeon: Milus Banister, MD;  Location: [facility];  Service: Endoscopy;  Laterality: N/A;  radial linear  . KNEE ARTHROPLASTY     total right  02-28-2010 Dr Pilar Plate Aluisio, left  . KNEE ARTHROPLASTY Bilateral 09/12/2017   Hector Shade  . LEFT HEART CATH AND CORONARY ANGIOGRAPHY N/A 04/20/2017   Procedure: LEFT HEART CATH AND CORONARY ANGIOGRAPHY;  Surgeon: Burnell Blanks, MD;  Location: [facility];  Service: Cardiovascular;  Laterality: N/A;  . TUBAL LIGATION      There were no vitals filed for this visit.  Subjective Assessment - 05/16/18 1138    Subjective  I am just so sore all thru my back and  post shld    Currently in Pain?  Yes    Pain Score  6     Pain Location  Shoulder    Pain Orientation  Right;Posterior         [facility] PT Assessment - 05/16/18 0001      AROM   AROM Assessment Site  Shoulder    Right/Left Shoulder  Right    Right Shoulder Flexion  170 Degrees    Right Shoulder ABduction  160 Degrees    Right Shoulder Internal Rotation  80 Degrees    Right Shoulder External Rotation  66 Degrees      Strength   Strength Assessment Site  Shoulder    Right/Left Shoulder  Right    Right Shoulder Flexion  3+/5    Right Shoulder Extension  3-/5    Right Shoulder ABduction  3/5    Right Shoulder Internal Rotation  3/5                   [facility] Adult PT Treatment/Exercise - 05/16/18 0001      Shoulder Exercises: Supine   Other Supine Exercises  shld ex to decrease compensation  Shoulder Exercises: Seated   Other Seated Exercises  row, fly, ext 3# 2 sets 10      Shoulder Exercises: Sidelying   External Rotation  Strengthening;Right;Weights;20 reps    External Rotation Weight (lbs)  3    ABduction  Strengthening;Right;Weights;20 reps    ABduction Weight (lbs)  3      Shoulder Exercises: Standing   External Rotation  Strengthening;Both;20 reps;Weights   3#     Shoulder Exercises: ROM/Strengthening   UBE (Upper Arm Bike)  L 4 3 fwd/3back    Lat Pull  20 reps   15#   Cybex Row  --   15# 2 sets 15     Vasopneumatic   Number Minutes Vasopneumatic   15 minutes    Vasopnuematic Location   Shoulder    Vasopneumatic Pressure  Medium    Vasopneumatic Temperature   35      Manual Therapy   Soft tissue mobilization  trap and rhomboids    Passive ROM  Rt shld supine all directions               PT Short Term Goals - 03/26/18 1252      PT SHORT TERM GOAL #1   Title  independent with initial HEP    Status  Achieved        PT Long Term Goals - 04/18/18 1105      PT LONG TERM GOAL #1   Title  decrease pain 50% for ADLs    Status   On-going      PT LONG TERM GOAL #2   Title  increase AROM of the right shoulder to 140 degrees flexion    Status  On-going      PT LONG TERM GOAL #3   Title  increase AROM of right shoulder ER to 70 degrees    Status  On-going      PT LONG TERM GOAL #4   Title  reports being able to do hair and dress without pain or limitation    Status  On-going      PT LONG TERM GOAL #5   Title  put 3# on head high shelf    Status  On-going            Plan - 05/16/18 1253    Clinical Impression Statement  adjusted ex d/t pain/soreness from trap compensations. ver tight and tender in rhmboid and trap on RT    PT Treatment/Interventions  Cryotherapy;Electrical Stimulation;Iontophoresis 4mg /ml Dexamethasone;Moist Heat;Therapeutic activities;Therapeutic exercise;Patient/family education;Manual techniques;Vasopneumatic Device;Passive range of motion    PT Next Visit Plan  PREs       Patient will benefit from skilled therapeutic intervention in order to improve the following deficits and impairments:  Increased edema, Decreased activity tolerance, Decreased strength, Impaired UE functional use, Pain, Increased muscle spasms, Decreased range of motion, Postural dysfunction  Visit Diagnosis: Stiffness of right shoulder, not elsewhere classified  Localized edema  Acute pain of right shoulder     Problem List Patient Active Problem List   Diagnosis Date Noted  . Osteoporosis 12/25/2017  . Coronary artery disease   . Dysuria 02/17/2017  . Diarrhea 02/17/2017  . Vitamin D deficiency 11/07/2016  . Acute on chronic diastolic CHF (congestive heart failure), NYHA class 1 ([place]) 11/18/2015  . Diabetes mellitus without complication ([place]) 23/76/2831  . Reactive airway disease with acute exacerbation 09/10/2014  . Nonspecific (abnormal) findings on radiological and other examination of gastrointestinal tract 04/04/2012  . Insomnia, idiopathic 10/04/2010  .  Hyperlipidemia 09/22/2009  . Essential  hypertension 09/22/2009  . CAD, NATIVE VESSEL 09/22/2009  . EDEMA 05/19/2009  . Hypothyroidism 02/23/2009  . EXTRINSIC ASTHMA, UNSPECIFIED 02/10/2009  . KERATOSIS, SEBORRHEIC [place] 04/16/2007  . SKIN CANCER, HX OF 02/14/2007  . Depression 01/24/2007  . MYOCARDIAL INFARCTION, HX OF 01/24/2007  . GERD 01/24/2007  . Osteoarthritis 01/24/2007  . COLONIC POLYPS, HX OF 01/24/2007  . IRRITABLE BOWEL SYNDROME, HX OF 01/24/2007    PAYSEUR,ANGIE  PTA 05/16/2018, 12:55 PM  [facility] [address] [place], [place], [address] Phone: [phone]   Fax:  [phone]  Name: Rebecca [name] MRN: [id number] Date of Birth: January 02, 1946

## 2018-05-21 ENCOUNTER — Encounter: Payer: Medicare Other | Admitting: Physical Therapy

## 2018-05-21 DIAGNOSIS — Z86018 Personal history of other benign neoplasm: Secondary | ICD-10-CM | POA: Diagnosis not present

## 2018-05-21 DIAGNOSIS — D225 Melanocytic nevi of trunk: Secondary | ICD-10-CM | POA: Diagnosis not present

## 2018-05-21 DIAGNOSIS — Z85820 Personal history of malignant melanoma of skin: Secondary | ICD-10-CM | POA: Diagnosis not present

## 2018-05-21 DIAGNOSIS — L814 Other melanin hyperpigmentation: Secondary | ICD-10-CM | POA: Diagnosis not present

## 2018-05-21 DIAGNOSIS — L821 Other seborrheic keratosis: Secondary | ICD-10-CM | POA: Diagnosis not present

## 2018-05-21 DIAGNOSIS — L570 Actinic keratosis: Secondary | ICD-10-CM | POA: Diagnosis not present

## 2018-05-21 DIAGNOSIS — Z85828 Personal history of other malignant neoplasm of skin: Secondary | ICD-10-CM | POA: Diagnosis not present

## 2018-05-21 DIAGNOSIS — Z23 Encounter for immunization: Secondary | ICD-10-CM | POA: Diagnosis not present

## 2018-05-23 ENCOUNTER — Ambulatory Visit: Payer: Medicare Other | Attending: Orthopedic Surgery | Admitting: Physical Therapy

## 2018-05-23 DIAGNOSIS — M25611 Stiffness of right shoulder, not elsewhere classified: Secondary | ICD-10-CM | POA: Diagnosis not present

## 2018-05-23 DIAGNOSIS — R600 Localized edema: Secondary | ICD-10-CM | POA: Diagnosis not present

## 2018-05-23 DIAGNOSIS — M25511 Pain in right shoulder: Secondary | ICD-10-CM | POA: Diagnosis not present

## 2018-05-23 NOTE — Therapy (Signed)
[facility] [place], [place], [address] Phone: [phone]   Fax:  [phone]  Physical Therapy Treatment  Patient Details  Name: Rebecca [name] MRN: [id number] Date of Birth: 1946/01/02 Referring Provider (PT): RA Theda Sers   Encounter Date: 05/23/2018  PT End of Session - 05/23/18 0919    Visit Number  22    Date for PT Re-Evaluation  06/07/18    PT Start Time  0830    PT Stop Time  0925    PT Time Calculation (min)  55 min       Past Medical History:  Diagnosis Date  . Asthma    extrinsic  . CAD (coronary artery disease)   . Chest pain, atypical   . Diabetes ([place])   . Edema   . Fatigue   . Fatigue   . GERD (gastroesophageal reflux disease)   . Hyperlipidemia   . Hypertension    familiar  . Hypothyroidism   . IBS (irritable bowel syndrome)    hx  . Keratosis, seborrheic   . Labyrinthitis   . MI (myocardial infarction) ([place])    age 72  . Osteoarthritis   . S/P arthroscopy of right shoulder 03/01/2018  . Skin cancer    Melanoma     Past Surgical History:  Procedure Laterality Date  . Breast Biopsy     dense breast tissue  . BREAST EXCISIONAL BIOPSY Left 2000   benign  . CHOLECYSTECTOMY    . EUS  04/04/2012   Procedure: UPPER ENDOSCOPIC ULTRASOUND (EUS) LINEAR;  Surgeon: Milus Banister, MD;  [facility];  Service: Endoscopy;  Laterality: N/A;  radial linear  . KNEE ARTHROPLASTY     total right  02-28-2010 Dr Pilar Plate Aluisio, left  . KNEE ARTHROPLASTY Bilateral 09/12/2017   Hector Shade  . LEFT HEART CATH AND CORONARY ANGIOGRAPHY N/A 04/20/2017   Procedure: LEFT HEART CATH AND CORONARY ANGIOGRAPHY;  Surgeon: Burnell Blanks, MD;  Location: [facility];  Service: Cardiovascular;  Laterality: N/A;  . TUBAL LIGATION      There were no vitals filed for this visit.  Subjective Assessment - 05/23/18 0831    Subjective  very sore after last session for 3 days-  not shld in muscles of neck    Currently in Pain?  Yes    Pain Score  1     Pain Location  Shoulder    Pain Orientation  Right         [facility] PT Assessment - 05/23/18 0001      AROM   AROM Assessment Site  Shoulder    Right/Left Shoulder  Right    Right Shoulder Flexion  174 Degrees    Right Shoulder ABduction  170 Degrees    Right Shoulder Internal Rotation  82 Degrees    Right Shoulder External Rotation  70 Degrees      Strength   Strength Assessment Site  Shoulder    Right/Left Shoulder  Right    Right Shoulder Flexion  4/5    Right Shoulder ABduction  4-/5    Right Shoulder Internal Rotation  4-/5    Right Shoulder External Rotation  4-/5                   [facility] Adult PT Treatment/Exercise - 05/23/18 0001      Shoulder Exercises: Standing   Other Standing Exercises  bicep and tricep pulley  Other Standing Exercises  3# free wt standing ex all directions fo rshld strength   had to stop abd with 3# too weak and compensations     Shoulder Exercises: ROM/Strengthening   UBE (Upper Arm Bike)  L 4 3 fwd/3back    Lat Pull  --   1 set 15# 2 sets 10 20#   Cybex Row  --   3 sets 10 20#   Other ROM/Strengthening Exercises  pulleys scap stab and IR/ER      Vasopneumatic   Number Minutes Vasopneumatic   15 minutes    Vasopnuematic Location   Shoulder    Vasopneumatic Pressure  Medium    Vasopneumatic Temperature   35      Manual Therapy   Manual Therapy  Passive ROM;Joint mobilization;Soft tissue mobilization    Joint Mobilization  capsule stretch    Passive ROM  Rt shld supine all directions             PT Education - 05/23/18 0919    Education provided  Yes    Education Details  red tband shld flex and yellow tband abd    Person(s) Educated  Patient    Methods  Explanation;Demonstration    Comprehension  Verbalized understanding;Returned demonstration       PT Short Term Goals - 03/26/18 1252      PT SHORT TERM GOAL #1   Title   independent with initial HEP    Status  Achieved        PT Long Term Goals - 05/23/18 3710      PT LONG TERM GOAL #1   Title  decrease pain 50% for ADLs    Status  Partially Met      PT LONG TERM GOAL #2   Title  increase AROM of the right shoulder to 140 degrees flexion    Status  Achieved      PT LONG TERM GOAL #3   Title  increase AROM of right shoulder ER to 70 degrees    Status  Achieved      PT LONG TERM GOAL #4   Title  reports being able to do hair and dress without pain or limitation    Status  Partially Met      PT LONG TERM GOAL #5   Title  put 3# on head high shelf    Status  Partially Met            Plan - 05/23/18 0922    Clinical Impression Statement  improved strength and ROM, abd weakness with compensation . increased HEP. Progressing with goals.    PT Treatment/Interventions  Cryotherapy;Electrical Stimulation;Iontophoresis 55m/ml Dexamethasone;Moist Heat;Therapeutic activities;Therapeutic exercise;Patient/family education;Manual techniques;Vasopneumatic Device;Passive range of motion    PT Next Visit Plan  PREs       Patient will benefit from skilled therapeutic intervention in order to improve the following deficits and impairments:  Increased edema, Decreased activity tolerance, Decreased strength, Impaired UE functional use, Pain, Increased muscle spasms, Decreased range of motion, Postural dysfunction  Visit Diagnosis: Stiffness of right shoulder, not elsewhere classified  Acute pain of right shoulder  Localized edema     Problem List Patient Active Problem List   Diagnosis Date Noted  . Osteoporosis 12/25/2017  . Coronary artery disease   . Dysuria 02/17/2017  . Diarrhea 02/17/2017  . Vitamin D deficiency 11/07/2016  . Acute on chronic diastolic CHF (congestive heart failure), NYHA class 1 ([place] 11/18/2015  . Diabetes mellitus without complication (  [place]) 01/11/2015  . Reactive airway disease with acute exacerbation 09/10/2014  .  Nonspecific (abnormal) findings on radiological and other examination of gastrointestinal tract 04/04/2012  . Insomnia, idiopathic 10/04/2010  . Hyperlipidemia 09/22/2009  . Essential hypertension 09/22/2009  . CAD, NATIVE VESSEL 09/22/2009  . EDEMA 05/19/2009  . Hypothyroidism 02/23/2009  . EXTRINSIC ASTHMA, UNSPECIFIED 02/10/2009  . KERATOSIS, SEBORRHEIC [place] 04/16/2007  . SKIN CANCER, HX OF 02/14/2007  . Depression 01/24/2007  . MYOCARDIAL INFARCTION, HX OF 01/24/2007  . GERD 01/24/2007  . Osteoarthritis 01/24/2007  . COLONIC POLYPS, HX OF 01/24/2007  . IRRITABLE BOWEL SYNDROME, HX OF 01/24/2007    ,ANGIE PTA 05/23/2018, 9:26 AM  [facility] [address] [place], [place], [address] Phone: [phone]   Fax:  [phone]  Name: Rebecca [name] MRN: [id number] Date of Birth: 12/16/1945

## 2018-05-28 ENCOUNTER — Ambulatory Visit: Payer: Medicare Other | Admitting: Physical Therapy

## 2018-05-28 DIAGNOSIS — M25511 Pain in right shoulder: Secondary | ICD-10-CM

## 2018-05-28 DIAGNOSIS — M25611 Stiffness of right shoulder, not elsewhere classified: Secondary | ICD-10-CM | POA: Diagnosis not present

## 2018-05-28 DIAGNOSIS — R600 Localized edema: Secondary | ICD-10-CM | POA: Diagnosis not present

## 2018-05-28 NOTE — Therapy (Signed)
[address] [place], [place], [address] Phone: [phone]   Fax:  [phone]  Physical Therapy Treatment  Patient Details  Name: Rebecca [name] MRN: [id number] Date of Birth: 1946-01-24 Referring Provider (PT): RA Theda Sers   Encounter Date: 05/28/2018  PT End of Session - 05/28/18 2229    Visit Number  23    Date for PT Re-Evaluation  06/07/18    PT Start Time  7989    PT Stop Time  1655    PT Time Calculation (min)  50 min       Past Medical History:  Diagnosis Date  . Asthma    extrinsic  . CAD (coronary artery disease)   . Chest pain, atypical   . Diabetes ([place])   . Edema   . Fatigue   . Fatigue   . GERD (gastroesophageal reflux disease)   . Hyperlipidemia   . Hypertension    familiar  . Hypothyroidism   . IBS (irritable bowel syndrome)    hx  . Keratosis, seborrheic   . Labyrinthitis   . MI (myocardial infarction) ([place])    age 1  . Osteoarthritis   . S/P arthroscopy of right shoulder 03/01/2018  . Skin cancer    Melanoma     Past Surgical History:  Procedure Laterality Date  . Breast Biopsy     dense breast tissue  . BREAST EXCISIONAL BIOPSY Left 2000   benign  . CHOLECYSTECTOMY    . EUS  04/04/2012   Procedure: UPPER ENDOSCOPIC ULTRASOUND (EUS) LINEAR;  Surgeon: Milus Banister, MD;  [facility];  Service: Endoscopy;  Laterality: N/A;  radial linear  . KNEE ARTHROPLASTY     total right  02-28-2010 Dr Pilar Plate Aluisio, left  . KNEE ARTHROPLASTY Bilateral 09/12/2017   Hector Shade  . LEFT HEART CATH AND CORONARY ANGIOGRAPHY N/A 04/20/2017   Procedure: LEFT HEART CATH AND CORONARY ANGIOGRAPHY;  Surgeon: Burnell Blanks, MD;  Location: [facility];  Service: Cardiovascular;  Laterality: N/A;  . TUBAL LIGATION      There were no vitals filed for this visit.  Subjective Assessment - 05/28/18 1607    Subjective  out of town over weekend- prolonged  sitting aggravated it    Currently in Pain?  Yes    Pain Score  1     Pain Location  Shoulder    Pain Orientation  Right                       [facility] Adult PT Treatment/Exercise - 05/28/18 0001      Shoulder Exercises: Standing   Other Standing Exercises  ball toss    Other Standing Exercises  3# free wt standing ex all directions fo rshld strength   abd 2 sets 5 d/t fatigue     Shoulder Exercises: ROM/Strengthening   UBE (Upper Arm Bike)  L 4 3 fwd/3back    Lat Pull  20 reps   20#   Cybex Row  20 reps   20#   Other ROM/Strengthening Exercises  chest press with serratus 10# 2 sets 10    Other ROM/Strengthening Exercises  bicep and tricep 2 sets 10      Vasopneumatic   Number Minutes Vasopneumatic   15 minutes    Vasopnuematic Location   Shoulder    Vasopneumatic Pressure  Medium    Vasopneumatic Temperature   35  Manual Therapy   Manual Therapy  Passive ROM    Passive ROM  Rt shld seated all directions               PT Short Term Goals - 03/26/18 1252      PT SHORT TERM GOAL #1   Title  independent with initial HEP    Status  Achieved        PT Long Term Goals - 05/23/18 0924      PT LONG TERM GOAL #1   Title  decrease pain 50% for ADLs    Status  Partially Met      PT LONG TERM GOAL #2   Title  increase AROM of the right shoulder to 140 degrees flexion    Status  Achieved      PT LONG TERM GOAL #3   Title  increase AROM of right shoulder ER to 70 degrees    Status  Achieved      PT LONG TERM GOAL #4   Title  reports being able to do hair and dress without pain or limitation    Status  Partially Met      PT LONG TERM GOAL #5   Title  put 3# on head high shelf    Status  Partially Met            Plan - 05/28/18 1647    Clinical Impression Statement  decreased compensations with ex today and less fatigue    PT Treatment/Interventions  Cryotherapy;Electrical Stimulation;Iontophoresis '4mg'$ /ml Dexamethasone;Moist  Heat;Therapeutic activities;Therapeutic exercise;Patient/family education;Manual techniques;Vasopneumatic Device;Passive range of motion    PT Next Visit Plan  PREs       Patient will benefit from skilled therapeutic intervention in order to improve the following deficits and impairments:  Increased edema, Decreased activity tolerance, Decreased strength, Impaired UE functional use, Pain, Increased muscle spasms, Decreased range of motion, Postural dysfunction  Visit Diagnosis: Stiffness of right shoulder, not elsewhere classified  Acute pain of right shoulder     Problem List Patient Active Problem List   Diagnosis Date Noted  . Osteoporosis 12/25/2017  . Coronary artery disease   . Dysuria 02/17/2017  . Diarrhea 02/17/2017  . Vitamin D deficiency 11/07/2016  . Acute on chronic diastolic CHF (congestive heart failure), NYHA class 1 ([place]) 11/18/2015  . Diabetes mellitus without complication ([place]) 82/50/5397  . Reactive airway disease with acute exacerbation 09/10/2014  . Nonspecific (abnormal) findings on radiological and other examination of gastrointestinal tract 04/04/2012  . Insomnia, idiopathic 10/04/2010  . Hyperlipidemia 09/22/2009  . Essential hypertension 09/22/2009  . CAD, NATIVE VESSEL 09/22/2009  . EDEMA 05/19/2009  . Hypothyroidism 02/23/2009  . EXTRINSIC ASTHMA, UNSPECIFIED 02/10/2009  . KERATOSIS, SEBORRHEIC [place] 04/16/2007  . SKIN CANCER, HX OF 02/14/2007  . Depression 01/24/2007  . MYOCARDIAL INFARCTION, HX OF 01/24/2007  . GERD 01/24/2007  . Osteoarthritis 01/24/2007  . COLONIC POLYPS, HX OF 01/24/2007  . IRRITABLE BOWEL SYNDROME, HX OF 01/24/2007    Quantia Grullon,ANGIE PTA 05/28/2018, 4:48 PM  [facility] [place], [place], [address] Phone: [phone]   Fax:  [phone]  Name: Rebecca [name] MRN: [id number] Date of Birth: 17-Dec-1945

## 2018-05-30 ENCOUNTER — Ambulatory Visit: Payer: Medicare Other | Admitting: Physical Therapy

## 2018-05-30 DIAGNOSIS — M25511 Pain in right shoulder: Secondary | ICD-10-CM | POA: Diagnosis not present

## 2018-05-30 DIAGNOSIS — R600 Localized edema: Secondary | ICD-10-CM | POA: Diagnosis not present

## 2018-05-30 DIAGNOSIS — M25611 Stiffness of right shoulder, not elsewhere classified: Secondary | ICD-10-CM | POA: Diagnosis not present

## 2018-05-30 NOTE — Therapy (Signed)
[facility] [address] [place], [place], [address] Phone: [phone]   Fax:  [phone]  Physical Therapy Treatment  Patient Details  Name: Rebecca [name] MRN: [id number] Date of Birth: 05/28/1946 Referring Provider (PT): Rebecca [name]   Encounter Date: 05/30/2018  PT End of Session - 05/30/18 0956    Visit Number  24    Date for PT Re-Evaluation  06/07/18    PT Start Time  0900    PT Stop Time  1000    PT Time Calculation (min)  60 min       Past Medical History:  Diagnosis Date  . Asthma    extrinsic  . CAD (coronary artery disease)   . Chest pain, atypical   . Diabetes ([place])   . Edema   . Fatigue   . Fatigue   . GERD (gastroesophageal reflux disease)   . Hyperlipidemia   . Hypertension    familiar  . Hypothyroidism   . IBS (irritable bowel syndrome)    hx  . Keratosis, seborrheic   . Labyrinthitis   . MI (myocardial infarction) ([place])    age 22  . Osteoarthritis   . S/P arthroscopy of right shoulder 03/01/2018  . Skin cancer    Melanoma     Past Surgical History:  Procedure Laterality Date  . Breast Biopsy     dense breast tissue  . BREAST EXCISIONAL BIOPSY Left 2000   benign  . CHOLECYSTECTOMY    . EUS  04/04/2012   Procedure: UPPER ENDOSCOPIC ULTRASOUND (EUS) LINEAR;  Surgeon: Milus Banister, MD;  [facility];  Service: Endoscopy;  Laterality: N/A;  radial linear  . KNEE ARTHROPLASTY     total right  02-28-2010 Dr Pilar Plate Aluisio, left  . KNEE ARTHROPLASTY Bilateral 09/12/2017   Hector Shade  . LEFT HEART CATH AND CORONARY ANGIOGRAPHY N/A 04/20/2017   Procedure: LEFT HEART CATH AND CORONARY ANGIOGRAPHY;  Surgeon: Burnell Blanks, MD;  Location: [facility];  Service: Cardiovascular;  Laterality: N/A;  . TUBAL LIGATION      There were no vitals filed for this visit.  Subjective Assessment - 05/30/18 0953    Subjective  went to yoga yesterday and did alot of  shld    Currently in Pain?  Yes    Pain Score  3     Pain Location  Shoulder    Pain Orientation  Right                       [facility] Adult PT Treatment/Exercise - 05/30/18 0001      Shoulder Exercises: Standing   Other Standing Exercises  red tband stab ex on wall   bal vs wall   Other Standing Exercises  3# free wt standing ex all directions for shld strength      Shoulder Exercises: ROM/Strengthening   UBE (Upper Arm Bike)  L 4 3 fwd/3back    Lat Pull  15 reps   2 sets 20#   Other ROM/Strengthening Exercises  cable pulleys   multi directional     Vasopneumatic   Number Minutes Vasopneumatic   15 minutes    Vasopnuematic Location   Shoulder    Vasopneumatic Pressure  Medium    Vasopneumatic Temperature   35      Manual Therapy   Manual Therapy  Passive ROM    Passive ROM  Rt shld seated all directions  PT Short Term Goals - 03/26/18 1252      PT SHORT TERM GOAL #1   Title  independent with initial HEP    Status  Achieved        PT Long Term Goals - 05/23/18 0924      PT LONG TERM GOAL #1   Title  decrease pain 50% for ADLs    Status  Partially Met      PT LONG TERM GOAL #2   Title  increase AROM of the right shoulder to 140 degrees flexion    Status  Achieved      PT LONG TERM GOAL #3   Title  increase AROM of right shoulder ER to 70 degrees    Status  Achieved      PT LONG TERM GOAL #4   Title  reports being able to do hair and dress without pain or limitation    Status  Partially Met      PT LONG TERM GOAL #5   Title  put 3# on head high shelf    Status  Partially Met            Plan - 05/30/18 0956    Clinical Impression Statement  weakness and fatigue but improving with func ROM, no pain just weakness/fatigue with ex. progressing with goals. abd still continues to be weakest    PT Treatment/Interventions  Cryotherapy;Electrical Stimulation;Iontophoresis '4mg'$ /ml Dexamethasone;Moist Heat;Therapeutic  activities;Therapeutic exercise;Patient/family education;Manual techniques;Vasopneumatic Device;Passive range of motion    PT Next Visit Plan  assess ROM and MMT. write MD note       Patient will benefit from skilled therapeutic intervention in order to improve the following deficits and impairments:  Increased edema, Decreased activity tolerance, Decreased strength, Impaired UE functional use, Pain, Increased muscle spasms, Decreased range of motion, Postural dysfunction  Visit Diagnosis: Stiffness of right shoulder, not elsewhere classified  Acute pain of right shoulder  Localized edema     Problem List Patient Active Problem List   Diagnosis Date Noted  . Osteoporosis 12/25/2017  . Coronary artery disease   . Dysuria 02/17/2017  . Diarrhea 02/17/2017  . Vitamin D deficiency 11/07/2016  . Acute on chronic diastolic CHF (congestive heart failure), NYHA class 1 ([place]) 11/18/2015  . Diabetes mellitus without complication ([place]) 79/39/0300  . Reactive airway disease with acute exacerbation 09/10/2014  . Nonspecific (abnormal) findings on radiological and other examination of gastrointestinal tract 04/04/2012  . Insomnia, idiopathic 10/04/2010  . Hyperlipidemia 09/22/2009  . Essential hypertension 09/22/2009  . CAD, NATIVE VESSEL 09/22/2009  . EDEMA 05/19/2009  . Hypothyroidism 02/23/2009  . EXTRINSIC ASTHMA, UNSPECIFIED 02/10/2009  . KERATOSIS, SEBORRHEIC [place] 04/16/2007  . SKIN CANCER, HX OF 02/14/2007  . Depression 01/24/2007  . MYOCARDIAL INFARCTION, HX OF 01/24/2007  . GERD 01/24/2007  . Osteoarthritis 01/24/2007  . COLONIC POLYPS, HX OF 01/24/2007  . IRRITABLE BOWEL SYNDROME, HX OF 01/24/2007    [name],ANGIE PTA 05/30/2018, 9:59 AM  [facility] [place], [place], [address] Phone: [phone]   Fax:  [phone]  Name: Rebecca [name] MRN: [id number] Date of Birth: [birth year]

## 2018-06-04 ENCOUNTER — Ambulatory Visit: Payer: Medicare Other | Admitting: Physical Therapy

## 2018-06-04 DIAGNOSIS — M25511 Pain in right shoulder: Secondary | ICD-10-CM | POA: Diagnosis not present

## 2018-06-04 DIAGNOSIS — R600 Localized edema: Secondary | ICD-10-CM | POA: Diagnosis not present

## 2018-06-04 DIAGNOSIS — M25611 Stiffness of right shoulder, not elsewhere classified: Secondary | ICD-10-CM | POA: Diagnosis not present

## 2018-06-04 NOTE — Therapy (Signed)
[address] [place], [place], [address] Phone: [phone]   Fax:  [phone]  Physical Therapy Treatment  Patient Details  Name: Rebecca [name] MRN: [id number] Date of Birth: 03/31/1946 Referring Provider (PT): RA Theda Sers   Encounter Date: 06/04/2018  PT End of Session - 06/04/18 1418    Visit Number  25    Date for PT Re-Evaluation  06/07/18    PT Start Time  1350    PT Stop Time  8338    PT Time Calculation (min)  55 min       Past Medical History:  Diagnosis Date  . Asthma    extrinsic  . CAD (coronary artery disease)   . Chest pain, atypical   . Diabetes ([place])   . Edema   . Fatigue   . Fatigue   . GERD (gastroesophageal reflux disease)   . Hyperlipidemia   . Hypertension    familiar  . Hypothyroidism   . IBS (irritable bowel syndrome)    hx  . Keratosis, seborrheic   . Labyrinthitis   . MI (myocardial infarction) ([place])    age 58  . Osteoarthritis   . S/P arthroscopy of right shoulder 03/01/2018  . Skin cancer    Melanoma     Past Surgical History:  Procedure Laterality Date  . Breast Biopsy     dense breast tissue  . BREAST EXCISIONAL BIOPSY Left 2000   benign  . CHOLECYSTECTOMY    . EUS  04/04/2012   Procedure: UPPER ENDOSCOPIC ULTRASOUND (EUS) LINEAR;  Surgeon: Milus Banister, MD;  [facility]: [facility];  Service: Endoscopy;  Laterality: N/A;  radial linear  . KNEE ARTHROPLASTY     total right  02-28-2010 Dr Pilar Plate Aluisio, left  . KNEE ARTHROPLASTY Bilateral 09/12/2017   Hector Shade  . LEFT HEART CATH AND CORONARY ANGIOGRAPHY N/A 04/20/2017   Procedure: LEFT HEART CATH AND CORONARY ANGIOGRAPHY;  Surgeon: Burnell Blanks, MD;  Location: [facility];  Service: Cardiovascular;  Laterality: N/A;  . TUBAL LIGATION      There were no vitals filed for this visit.  Subjective Assessment - 06/04/18 1414    Subjective  over did with abd ex this weekend I  think    Currently in Pain?  Yes    Pain Score  5     Pain Location  Shoulder    Pain Orientation  Right         [facility] PT Assessment - 06/04/18 0001      AROM   AROM Assessment Site  Shoulder    Right/Left Shoulder  Right    Right Shoulder Flexion  174 Degrees    Right Shoulder ABduction  170 Degrees    Right Shoulder Internal Rotation  82 Degrees    Right Shoulder External Rotation  70 Degrees      Strength   Strength Assessment Site  Shoulder;Elbow    Right/Left Shoulder  Right    Right Shoulder Flexion  4/5    Right Shoulder ABduction  4/5    Right Shoulder Internal Rotation  4-/5    Right Shoulder External Rotation  4-/5    Right/Left Elbow  Right    Right Elbow Flexion  5/5                   [facility] Adult PT Treatment/Exercise - 06/04/18 0001      Modalities   Modalities  Electrical  Stimulation;Ultrasound;Vasopneumatic      Veterinary surgeon  [employer] [facility]    Electrical Stimulation Parameters  seated    Electrical Stimulation Goals  Pain      Ultrasound   Ultrasound Location  RT ant /lat shld    Ultrasound Parameters  1.4 w/cm2 100 % cont    Ultrasound Goals  Pain      Vasopneumatic   Number Minutes Vasopneumatic   15 minutes    Vasopnuematic Location   Shoulder    Vasopneumatic Pressure  Medium    Vasopneumatic Temperature   35      Manual Therapy   Manual Therapy  Joint mobilization;Passive ROM               PT Short Term Goals - 03/26/18 1252      PT SHORT TERM GOAL #1   Title  independent with initial HEP    Status  Achieved        PT Long Term Goals - 06/04/18 1420      PT LONG TERM GOAL #1   Title  decrease pain 50% for ADLs    Status  Partially Met      PT LONG TERM GOAL #4   Title  reports being able to do hair and dress without pain or limitation    Status  Partially Met      PT LONG TERM GOAL #5   Title  put 3# on head high shelf     Status  Partially Met            Plan - 06/04/18 1418    Clinical Impression Statement  good ROM and strength. pain in ant lat shld, after discussion we belief with pt working and at home with free wts ( which is difficult) I think she was coming in slight flexion causing irritation to bicep tendo- tx with modalities today.    PT Treatment/Interventions  Cryotherapy;Electrical Stimulation;Iontophoresis '4mg'$ /ml Dexamethasone;Moist Heat;Therapeutic activities;Therapeutic exercise;Patient/family education;Manual techniques;Vasopneumatic Device;Passive range of motion    PT Next Visit Plan  MD note sent with pt . Rec 1 x week for 1 month to transition to independant ex       Patient will benefit from skilled therapeutic intervention in order to improve the following deficits and impairments:  Increased edema, Decreased activity tolerance, Decreased strength, Impaired UE functional use, Pain, Increased muscle spasms, Decreased range of motion, Postural dysfunction  Visit Diagnosis: Stiffness of right shoulder, not elsewhere classified  Acute pain of right shoulder  Localized edema     Problem List Patient Active Problem List   Diagnosis Date Noted  . Osteoporosis 12/25/2017  . Coronary artery disease   . Dysuria 02/17/2017  . Diarrhea 02/17/2017  . Vitamin D deficiency 11/07/2016  . Acute on chronic diastolic CHF (congestive heart failure), NYHA class 1 ([place]) 11/18/2015  . Diabetes mellitus without complication ([place]) 22/97/9892  . Reactive airway disease with acute exacerbation 09/10/2014  . Nonspecific (abnormal) findings on radiological and other examination of gastrointestinal tract 04/04/2012  . Insomnia, idiopathic 10/04/2010  . Hyperlipidemia 09/22/2009  . Essential hypertension 09/22/2009  . CAD, NATIVE VESSEL 09/22/2009  . EDEMA 05/19/2009  . Hypothyroidism 02/23/2009  . EXTRINSIC ASTHMA, UNSPECIFIED 02/10/2009  . KERATOSIS, SEBORRHEIC [place] 04/16/2007  . SKIN CANCER,  HX OF 02/14/2007  . Depression 01/24/2007  . MYOCARDIAL INFARCTION, HX OF 01/24/2007  . GERD 01/24/2007  . Osteoarthritis 01/24/2007  . COLONIC  POLYPS, HX OF 01/24/2007  . IRRITABLE BOWEL SYNDROME, HX OF 01/24/2007    Alayna Mabe,ANGIE PTA 06/04/2018, 2:21 PM  [address] [place], [place], [address] Phone: [phone]   Fax:  [phone]  Name: Rebecca [name] MRN: [id number] Date of Birth: [birth year]

## 2018-06-05 DIAGNOSIS — M5030 Other cervical disc degeneration, unspecified cervical region: Secondary | ICD-10-CM | POA: Diagnosis not present

## 2018-06-05 DIAGNOSIS — M25511 Pain in right shoulder: Secondary | ICD-10-CM | POA: Diagnosis not present

## 2018-06-05 DIAGNOSIS — M5412 Radiculopathy, cervical region: Secondary | ICD-10-CM | POA: Diagnosis not present

## 2018-06-05 DIAGNOSIS — Z4789 Encounter for other orthopedic aftercare: Secondary | ICD-10-CM | POA: Diagnosis not present

## 2018-06-05 NOTE — Therapy (Addendum)
PHYSICAL THERAPY DISCHARGE SUMMARY   Plan: Patient agrees to discharge.  Patient goals were partially met. Patient is being discharged due to a change in medical status.  ?????

## 2018-06-06 ENCOUNTER — Ambulatory Visit: Payer: Medicare Other | Admitting: Physical Therapy

## 2018-06-10 ENCOUNTER — Encounter: Payer: Self-pay | Admitting: Adult Health

## 2018-06-11 ENCOUNTER — Ambulatory Visit: Payer: Medicare Other | Admitting: Physical Therapy

## 2018-06-11 ENCOUNTER — Ambulatory Visit (INDEPENDENT_AMBULATORY_CARE_PROVIDER_SITE_OTHER): Payer: Medicare Other

## 2018-06-11 DIAGNOSIS — Q782 Osteopetrosis: Secondary | ICD-10-CM

## 2018-06-11 MED ORDER — DENOSUMAB 60 MG/ML ~~LOC~~ SOSY
60.0000 mg | PREFILLED_SYRINGE | Freq: Once | SUBCUTANEOUS | Status: AC
  Administered 2018-06-11: 60 mg via SUBCUTANEOUS

## 2018-06-11 NOTE — Progress Notes (Signed)
Per orders of [name] NP, injection of Prolia given by Rebecca Eaton. Patient tolerated injection well.

## 2018-06-18 ENCOUNTER — Ambulatory Visit: Payer: Medicare Other | Attending: Orthopedic Surgery | Admitting: Physical Therapy

## 2018-06-18 ENCOUNTER — Encounter: Payer: Self-pay | Admitting: Physical Therapy

## 2018-06-18 DIAGNOSIS — M25511 Pain in right shoulder: Secondary | ICD-10-CM | POA: Diagnosis not present

## 2018-06-18 DIAGNOSIS — M25611 Stiffness of right shoulder, not elsewhere classified: Secondary | ICD-10-CM | POA: Diagnosis not present

## 2018-06-18 DIAGNOSIS — R600 Localized edema: Secondary | ICD-10-CM | POA: Insufficient documentation

## 2018-06-18 DIAGNOSIS — M542 Cervicalgia: Secondary | ICD-10-CM | POA: Diagnosis not present

## 2018-06-18 NOTE — Therapy (Signed)
[address] [place], [place], [address] Phone: [phone]   Fax:  [phone]  Physical Therapy Evaluation  Patient Details  Name: Rebecca [name] MRN: [id number] Date of Birth: 06/15/1946 Referring Provider (PT): Jerilynn Mages   Encounter Date: 06/18/2018  PT End of Session - 06/18/18 1527    Visit Number  1    Date for PT Re-Evaluation  08/19/18    PT Start Time  0539    PT Stop Time  1445    PT Time Calculation (min)  50 min    Activity Tolerance  Patient tolerated treatment well    Behavior During Therapy  [facility] for tasks assessed/performed       Past Medical History:  Diagnosis Date  . Asthma    extrinsic  . CAD (coronary artery disease)   . Chest pain, atypical   . Diabetes ([place])   . Edema   . Fatigue   . Fatigue   . GERD (gastroesophageal reflux disease)   . Hyperlipidemia   . Hypertension    familiar  . Hypothyroidism   . IBS (irritable bowel syndrome)    hx  . Keratosis, seborrheic   . Labyrinthitis   . MI (myocardial infarction) ([place])    age 72  . Osteoarthritis   . S/P arthroscopy of right shoulder 03/01/2018  . Skin cancer    Melanoma     Past Surgical History:  Procedure Laterality Date  . Breast Biopsy     dense breast tissue  . BREAST EXCISIONAL BIOPSY Left 2000   benign  . CHOLECYSTECTOMY    . EUS  04/04/2012   Procedure: UPPER ENDOSCOPIC ULTRASOUND (EUS) LINEAR;  Surgeon: Milus Banister, MD;  [facility];  Service: Endoscopy;  Laterality: N/A;  radial linear  . KNEE ARTHROPLASTY     total right  02-28-2010 Dr Pilar Plate Aluisio, left  . KNEE ARTHROPLASTY Bilateral 09/12/2017   Hector Shade  . LEFT HEART CATH AND CORONARY ANGIOGRAPHY N/A 04/20/2017   Procedure: LEFT HEART CATH AND CORONARY ANGIOGRAPHY;  Surgeon: Burnell Blanks, MD;  Location: [facility];  Service: Cardiovascular;  Laterality: N/A;  . TUBAL LIGATION      There were no  vitals filed for this visit.   Subjective Assessment - 06/18/18 1359    Subjective  Patient had right RC repair 02/28/18.  She was doing very well with the recovery from this, she started having some pain down the right arm and reports that she started noticing some weakness in the right arm and hand.  X-rays showed severe DDD of the C4-7 spine, with some spurring    Pertinent History  right RC repair 4 months ago    Patient Stated Goals  difficulty sleeping    Currently in Pain?  Yes    Pain Score  5     Pain Location  Arm    Pain Orientation  Right;Upper    Pain Descriptors / Indicators  Aching;Burning    Pain Type  Acute pain    Pain Radiating Towards  pain down the right arm to the right thumb    Aggravating Factors   sleeping, up to 6-7/10    Pain Relieving Factors  rest and ice pain can be 2/10, mm relaxer    Effect of Pain on Daily Activities  difficulty sleeping, weakness of the right arm         [facility] PT Assessment - 06/18/18 0001  Assessment   Medical Diagnosis  cervical DDD    Referring Provider (PT)  Jerilynn Mages    Onset Date/Surgical Date  06/05/18    Hand Dominance  Right    Prior Therapy  for right shoulder RC repair      Precautions   Precautions  None      Home Environment   Additional Comments  caring for husband (mostly independent but has to help sometimes with ADLs and getting around), some light gardening and housework      Prior Function   Level of Independence  Independent    Vocation  Retired    Leisure  exercise classes (yoga, aerobics, stability, dancing) 5-6x/week sometimes doing multiple classes a day      Posture/Postural Control   Posture Comments  fwd head, rounded shoulders      ROM / Strength   AROM / PROM / Strength  AROM;Strength      AROM   Overall AROM Comments  cervical flexion and extension WNL's, side bending and rotation decreased 25% with c/o tightness and then some achey/burning, no pain down the arm    Right  Shoulder Flexion  155 Degrees    Right Shoulder ABduction  150 Degrees    Right Shoulder Internal Rotation  80 Degrees    Right Shoulder External Rotation  70 Degrees      Strength   Overall Strength Comments  the grip, elbow and forearm strength are 4/5 bilaterally, did not fully test the shoulder due to the [facility] repair      Palpation   Palpation comment  she is very tight in the upper trap, the rhomboid, the pec and the cervical paraspinals      Special Tests   Other special tests  mild right UE neural tension and + spurlings                Objective measurements completed on examination: See above findings.      [facility] Adult PT Treatment/Exercise - 06/18/18 0001      Manual Therapy   Manual Therapy  Manual Traction    Manual Traction  occipital release, gentle stretch of the cervical spine and manual shoulder depression               PT Short Term Goals - 03/26/18 1252      PT SHORT TERM GOAL #1   Title  independent with initial HEP    Status  Achieved        PT Long Term Goals - 06/18/18 1532      PT LONG TERM GOAL #1   Title  decrease pain 50% for ADLs    Time  8    Period  Weeks    Status  New      PT LONG TERM GOAL #2   Title  increase AROM of the right shoulder to 170 degrees flexion    Time  8    Period  Weeks    Status  New      PT LONG TERM GOAL #3   Title  increase cervical ROM to WNL's    Time  8    Period  Weeks    Status  New      PT LONG TERM GOAL #4   Title  resume gym program safely    Time  8    Period  Weeks    Status  New  Plan - 06/18/18 1527    Clinical Impression Statement  Patient has been seen here for s/p right RC repair 4 months ago.  She was overall doing well with great ROM, she started having some right Ue pain with pain to the hand and she was reporting some weakness, she had an x-ray that showed severe DDD of the cervical spine, this really worried the patient and she has been very upset.   She ahs stopped all exercises per the MD.  She has mild limitation in her cervical ROM, some UE neural tension.      Clinical Presentation  Evolving    Clinical Decision Making  Low    PT Frequency  2x / week    PT Duration  8 weeks    PT Treatment/Interventions  Cryotherapy;Electrical Stimulation;Iontophoresis 4mg /ml Dexamethasone;Moist Heat;Therapeutic activities;Therapeutic exercise;Patient/family education;Manual techniques;Vasopneumatic Device;Passive range of motion;Traction;Ultrasound;Dry needling    PT Next Visit Plan  started some manual traction and educated patient on traction, can progress to treat the spasm as well    Consulted and Agree with Plan of Care  Patient       Patient will benefit from skilled therapeutic intervention in order to improve the following deficits and impairments:  Increased edema, Decreased activity tolerance, Decreased strength, Impaired UE functional use, Pain, Increased muscle spasms, Decreased range of motion, Postural dysfunction, Improper body mechanics, Impaired flexibility  Visit Diagnosis: Cervicalgia - Plan: PT plan of care cert/re-cert  Stiffness of right shoulder, not elsewhere classified - Plan: PT plan of care cert/re-cert  Acute pain of right shoulder - Plan: PT plan of care cert/re-cert  Localized edema - Plan: PT plan of care cert/re-cert     Problem List Patient Active Problem List   Diagnosis Date Noted  . Osteoporosis 12/25/2017  . Coronary artery disease   . Dysuria 02/17/2017  . Diarrhea 02/17/2017  . Vitamin D deficiency 11/07/2016  . Acute on chronic diastolic CHF (congestive heart failure), NYHA class 1 ([place]) 11/18/2015  . Diabetes mellitus without complication ([place]) 48/25/0037  . Reactive airway disease with acute exacerbation 09/10/2014  . Nonspecific (abnormal) findings on radiological and other examination of gastrointestinal tract 04/04/2012  . Insomnia, idiopathic 10/04/2010  . Hyperlipidemia 09/22/2009  .  Essential hypertension 09/22/2009  . CAD, NATIVE VESSEL 09/22/2009  . EDEMA 05/19/2009  . Hypothyroidism 02/23/2009  . EXTRINSIC ASTHMA, UNSPECIFIED 02/10/2009  . KERATOSIS, SEBORRHEIC [place] 04/16/2007  . SKIN CANCER, HX OF 02/14/2007  . Depression 01/24/2007  . MYOCARDIAL INFARCTION, HX OF 01/24/2007  . GERD 01/24/2007  . Osteoarthritis 01/24/2007  . COLONIC POLYPS, HX OF 01/24/2007  . IRRITABLE BOWEL SYNDROME, HX OF 01/24/2007    Sumner Boast., PT 06/18/2018, 5:56 PM  [address] [place], [place], [address] Phone: [phone]   Fax:  [phone]  Name: Rebecca [name] MRN: [id number] Date of Birth: 11-17-1945

## 2018-06-20 ENCOUNTER — Ambulatory Visit: Payer: Medicare Other | Admitting: Physical Therapy

## 2018-06-20 DIAGNOSIS — M25511 Pain in right shoulder: Secondary | ICD-10-CM | POA: Diagnosis not present

## 2018-06-20 DIAGNOSIS — M25611 Stiffness of right shoulder, not elsewhere classified: Secondary | ICD-10-CM | POA: Diagnosis not present

## 2018-06-20 DIAGNOSIS — M542 Cervicalgia: Secondary | ICD-10-CM

## 2018-06-20 DIAGNOSIS — R600 Localized edema: Secondary | ICD-10-CM | POA: Diagnosis not present

## 2018-06-20 NOTE — Therapy (Signed)
[address] [place], [place], [address] Phone: [phone]   Fax:  [phone]  Physical Therapy Treatment  Patient Details  Name: Rebecca [name] MRN: [id number] Date of Birth: [birth year] Referring Provider (PT): Jerilynn Mages   Encounter Date: 06/20/2018  PT End of Session - 06/20/18 1131    Visit Number  2    Date for PT Re-Evaluation  08/19/18    PT Start Time  2376    PT Stop Time  1132    PT Time Calculation (min)  45 min       Past Medical History:  Diagnosis Date  . Asthma    extrinsic  . CAD (coronary artery disease)   . Chest pain, atypical   . Diabetes ([place])   . Edema   . Fatigue   . Fatigue   . GERD (gastroesophageal reflux disease)   . Hyperlipidemia   . Hypertension    familiar  . Hypothyroidism   . IBS (irritable bowel syndrome)    hx  . Keratosis, seborrheic   . Labyrinthitis   . MI (myocardial infarction) ([place])    age 37  . Osteoarthritis   . S/P arthroscopy of right shoulder 03/01/2018  . Skin cancer    Melanoma     Past Surgical History:  Procedure Laterality Date  . Breast Biopsy     dense breast tissue  . BREAST EXCISIONAL BIOPSY Left 2000   benign  . CHOLECYSTECTOMY    . EUS  04/04/2012   Procedure: UPPER ENDOSCOPIC ULTRASOUND (EUS) LINEAR;  Surgeon: Milus Banister, MD;  [facility];  Service: Endoscopy;  Laterality: N/A;  radial linear  . KNEE ARTHROPLASTY     total right  02-28-2010 Dr Pilar Plate Aluisio, left  . KNEE ARTHROPLASTY Bilateral 09/12/2017   Hector Shade  . LEFT HEART CATH AND CORONARY ANGIOGRAPHY N/A 04/20/2017   Procedure: LEFT HEART CATH AND CORONARY ANGIOGRAPHY;  Surgeon: Burnell Blanks, MD;  Location: [facility];  Service: Cardiovascular;  Laterality: N/A;  . TUBAL LIGATION      There were no vitals filed for this visit.  Subjective Assessment - 06/20/18 1117    Subjective  i am really worried about my neck,  trying to do ROM ex for shld to loose ground there but no other ex    Currently in Pain?  Yes    Pain Score  6     Pain Location  Neck                       [facility] Adult PT Treatment/Exercise - 06/20/18 0001      Modalities   Modalities  Traction      Moist Heat Therapy   Number Minutes Moist Heat  15 Minutes    Moist Heat Location  Cervical      Ultrasound   Ultrasound Location  Cerv.trap with estim    Ultrasound Parameters  COMBO    Ultrasound Goals  Pain;Other (Comment)   tightness     Traction   Type of Traction  Cervical    Max (lbs)  10    Time  12      Manual Therapy   Soft tissue mobilization  cerv/traps and rhomboids   Increased tenderness  and TP on RT              PT Short Term Goals - 03/26/18 1252  PT SHORT TERM GOAL #1   Title  independent with initial HEP    Status  Achieved        PT Long Term Goals - 06/18/18 1532      PT LONG TERM GOAL #1   Title  decrease pain 50% for ADLs    Time  8    Period  Weeks    Status  New      PT LONG TERM GOAL #2   Title  increase AROM of the right shoulder to 170 degrees flexion    Time  8    Period  Weeks    Status  New      PT LONG TERM GOAL #3   Title  increase cervical ROM to WNL's    Time  8    Period  Weeks    Status  New      PT LONG TERM GOAL #4   Title  resume gym program safely    Time  8    Period  Weeks    Status  New            Plan - 06/20/18 1131    Clinical Impression Statement  " i feel 30# lighter" pt responded well to modalities today to relax and stretch mucsles. increased tenderness and TP on RT cerv vs left. educ pt to hold off on ex for now until we can get muscles relaxed- pt VU    PT Treatment/Interventions  Cryotherapy;Electrical Stimulation;Iontophoresis 4mg /ml Dexamethasone;Moist Heat;Therapeutic activities;Therapeutic exercise;Patient/family education;Manual techniques;Vasopneumatic Device;Passive range of motion;Traction;Ultrasound;Dry  needling    PT Next Visit Plan  assess today tx and progress as appropriate       Patient will benefit from skilled therapeutic intervention in order to improve the following deficits and impairments:  Increased edema, Decreased activity tolerance, Decreased strength, Impaired UE functional use, Pain, Increased muscle spasms, Decreased range of motion, Postural dysfunction, Improper body mechanics, Impaired flexibility  Visit Diagnosis: Cervicalgia     Problem List Patient Active Problem List   Diagnosis Date Noted  . Osteoporosis 12/25/2017  . Coronary artery disease   . Dysuria 02/17/2017  . Diarrhea 02/17/2017  . Vitamin D deficiency 11/07/2016  . Acute on chronic diastolic CHF (congestive heart failure), NYHA class 1 ([place]) 11/18/2015  . Diabetes mellitus without complication ([place]) 44/96/7591  . Reactive airway disease with acute exacerbation 09/10/2014  . Nonspecific (abnormal) findings on radiological and other examination of gastrointestinal tract 04/04/2012  . Insomnia, idiopathic 10/04/2010  . Hyperlipidemia 09/22/2009  . Essential hypertension 09/22/2009  . CAD, NATIVE VESSEL 09/22/2009  . EDEMA 05/19/2009  . Hypothyroidism 02/23/2009  . EXTRINSIC ASTHMA, UNSPECIFIED 02/10/2009  . KERATOSIS, SEBORRHEIC [place] 04/16/2007  . SKIN CANCER, HX OF 02/14/2007  . Depression 01/24/2007  . MYOCARDIAL INFARCTION, HX OF 01/24/2007  . GERD 01/24/2007  . Osteoarthritis 01/24/2007  . COLONIC POLYPS, HX OF 01/24/2007  . IRRITABLE BOWEL SYNDROME, HX OF 01/24/2007    PAYSEUR,ANGIE PTA 06/20/2018, 11:34 AM  [address] [place], [place], [address] Phone: [phone]   Fax:  [phone]  Name: Rebecca [name] MRN: [id number] Date of Birth: 1945/11/19

## 2018-06-25 ENCOUNTER — Ambulatory Visit: Payer: Medicare Other | Admitting: Physical Therapy

## 2018-06-25 DIAGNOSIS — M542 Cervicalgia: Secondary | ICD-10-CM | POA: Diagnosis not present

## 2018-06-25 DIAGNOSIS — M25511 Pain in right shoulder: Secondary | ICD-10-CM | POA: Diagnosis not present

## 2018-06-25 DIAGNOSIS — M25611 Stiffness of right shoulder, not elsewhere classified: Secondary | ICD-10-CM | POA: Diagnosis not present

## 2018-06-25 DIAGNOSIS — R600 Localized edema: Secondary | ICD-10-CM | POA: Diagnosis not present

## 2018-06-25 NOTE — Therapy (Signed)
[facility] [employer] [place], [place], [address] Phone: [phone]   Fax:  [phone]  Physical Therapy Treatment  Patient Details  Name: Rebecca [name] MRN: [id number] Date of Birth: 08/23/1945 Referring Provider (PT): Jerilynn Mages   Encounter Date: 06/25/2018  PT End of Session - 06/25/18 0908    Visit Number  3    Date for PT Re-Evaluation  08/19/18    PT Start Time  0837    PT Stop Time  0930    PT Time Calculation (min)  53 min       Past Medical History:  Diagnosis Date  . Asthma    extrinsic  . CAD (coronary artery disease)   . Chest pain, atypical   . Diabetes ([place])   . Edema   . Fatigue   . Fatigue   . GERD (gastroesophageal reflux disease)   . Hyperlipidemia   . Hypertension    familiar  . Hypothyroidism   . IBS (irritable bowel syndrome)    hx  . Keratosis, seborrheic   . Labyrinthitis   . MI (myocardial infarction) ([place])    age 42  . Osteoarthritis   . S/P arthroscopy of right shoulder 03/01/2018  . Skin cancer    Melanoma     Past Surgical History:  Procedure Laterality Date  . Breast Biopsy     dense breast tissue  . BREAST EXCISIONAL BIOPSY Left 2000   benign  . CHOLECYSTECTOMY    . EUS  04/04/2012   Procedure: UPPER ENDOSCOPIC ULTRASOUND (EUS) LINEAR;  Surgeon: Milus Banister, MD;  [facility];  Service: Endoscopy;  Laterality: N/A;  radial linear  . KNEE ARTHROPLASTY     total right  02-28-2010 Dr Pilar Plate Aluisio, left  . KNEE ARTHROPLASTY Bilateral 09/12/2017   Hector Shade  . LEFT HEART CATH AND CORONARY ANGIOGRAPHY N/A 04/20/2017   Procedure: LEFT HEART CATH AND CORONARY ANGIOGRAPHY;  Surgeon: Burnell Blanks, MD;  Location: [facility];  Service: Cardiovascular;  Laterality: N/A;  . TUBAL LIGATION      There were no vitals filed for this visit.  Subjective Assessment - 06/25/18 0905    Subjective  up and down over weekend but last  tx helped    Currently in Pain?  Yes    Pain Score  2     Pain Location  Neck                       [facility] Adult PT Treatment/Exercise - 06/25/18 0001      Modalities   Modalities  Traction      Moist Heat Therapy   Number Minutes Moist Heat  15 Minutes    Moist Heat Location  Cervical      Electrical Stimulation   Electrical Stimulation Location  BIL cerv/trap    Electrical Stimulation Action  IFC    Electrical Stimulation Goals  Pain   tightness     Ultrasound   Ultrasound Location  cerv/trap with estim    Ultrasound Parameters  COMBO    Ultrasound Goals  Pain;Other (Comment)   tightness     Traction   Type of Traction  Cervical    Max (lbs)  10    Time  15      Manual Therapy   Manual therapy comments  cerv retraction supine with ball    Soft tissue mobilization  cerv/traps and rhomboids  Passive ROM  PROM cerv with head on ball    Manual Traction  occipital release, gentle stretch of the cervical spine and manual shoulder depression             PT Education - 06/25/18 0910    Education provided  Yes    Education Details  cerv retarction, shruggs, rolls and scap squeeze    Person(s) Educated  Patient    Methods  Explanation;Demonstration;Handout    Comprehension  Verbalized understanding;Returned demonstration       PT Short Term Goals - 06/25/18 0910      PT SHORT TERM GOAL #1   Title  independent with initial HEP    Status  Achieved        PT Long Term Goals - 06/18/18 1532      PT LONG TERM GOAL #1   Title  decrease pain 50% for ADLs    Time  8    Period  Weeks    Status  New      PT LONG TERM GOAL #2   Title  increase AROM of the right shoulder to 170 degrees flexion    Time  8    Period  Weeks    Status  New      PT LONG TERM GOAL #3   Title  increase cervical ROM to WNL's    Time  8    Period  Weeks    Status  New      PT LONG TERM GOAL #4   Title  resume gym program safely    Time  8    Period  Weeks     Status  New            Plan - 06/25/18 0908    Clinical Impression Statement  less tightness today ( still cuing to relax ) less pain with increased ROM. added supine MT as well and Act mvmt and she tolerated well    PT Treatment/Interventions  Cryotherapy;Electrical Stimulation;Iontophoresis 4mg /ml Dexamethasone;Moist Heat;Therapeutic activities;Therapeutic exercise;Patient/family education;Manual techniques;Vasopneumatic Device;Passive range of motion;Traction;Ultrasound;Dry needling    PT Next Visit Plan  assess and progress       Patient will benefit from skilled therapeutic intervention in order to improve the following deficits and impairments:  Increased edema, Decreased activity tolerance, Decreased strength, Impaired UE functional use, Pain, Increased muscle spasms, Decreased range of motion, Postural dysfunction, Improper body mechanics, Impaired flexibility  Visit Diagnosis: Cervicalgia     Problem List Patient Active Problem List   Diagnosis Date Noted  . Osteoporosis 12/25/2017  . Coronary artery disease   . Dysuria 02/17/2017  . Diarrhea 02/17/2017  . Vitamin D deficiency 11/07/2016  . Acute on chronic diastolic CHF (congestive heart failure), NYHA class 1 ([place]) 11/18/2015  . Diabetes mellitus without complication ([place]) 30/03/2329  . Reactive airway disease with acute exacerbation 09/10/2014  . Nonspecific (abnormal) findings on radiological and other examination of gastrointestinal tract 04/04/2012  . Insomnia, idiopathic 10/04/2010  . Hyperlipidemia 09/22/2009  . Essential hypertension 09/22/2009  . CAD, NATIVE VESSEL 09/22/2009  . EDEMA 05/19/2009  . Hypothyroidism 02/23/2009  . EXTRINSIC ASTHMA, UNSPECIFIED 02/10/2009  . KERATOSIS, SEBORRHEIC [place] 04/16/2007  . SKIN CANCER, HX OF 02/14/2007  . Depression 01/24/2007  . MYOCARDIAL INFARCTION, HX OF 01/24/2007  . GERD 01/24/2007  . Osteoarthritis 01/24/2007  . COLONIC POLYPS, HX OF 01/24/2007  .  IRRITABLE BOWEL SYNDROME, HX OF 01/24/2007    Brevin Mcfadden,ANGIE PTA 06/25/2018, 9:11 AM  [facility]  [place] [address] [place], [place], [address] Phone: [phone]   Fax:  [phone]  Name: Rebecca [name] MRN: [id number] Date of Birth: Oct 10, 1945

## 2018-07-02 ENCOUNTER — Ambulatory Visit: Payer: Medicare Other | Admitting: Physical Therapy

## 2018-07-02 ENCOUNTER — Encounter: Payer: Medicare Other | Admitting: *Deleted

## 2018-07-02 DIAGNOSIS — M25511 Pain in right shoulder: Secondary | ICD-10-CM | POA: Diagnosis not present

## 2018-07-02 DIAGNOSIS — Z006 Encounter for examination for normal comparison and control in clinical research program: Secondary | ICD-10-CM

## 2018-07-02 DIAGNOSIS — M542 Cervicalgia: Secondary | ICD-10-CM | POA: Diagnosis not present

## 2018-07-02 DIAGNOSIS — R600 Localized edema: Secondary | ICD-10-CM | POA: Diagnosis not present

## 2018-07-02 DIAGNOSIS — M25611 Stiffness of right shoulder, not elsewhere classified: Secondary | ICD-10-CM | POA: Diagnosis not present

## 2018-07-02 NOTE — Therapy (Signed)
[facility] [place], [place], [address] Phone: [phone]   Fax:  [phone]  Physical Therapy Treatment  Patient Details  Name: Rebecca [name] MRN: [id number] Date of Birth: 1946-06-24 Referring Provider (PT): Jerilynn Mages   Encounter Date: 07/02/2018  PT End of Session - 07/02/18 1454    Visit Number  4    Date for PT Re-Evaluation  08/19/18    PT Start Time  3154    PT Stop Time  1455    PT Time Calculation (min)  60 min       Past Medical History:  Diagnosis Date  . Asthma    extrinsic  . CAD (coronary artery disease)   . Chest pain, atypical   . Diabetes ([place])   . Edema   . Fatigue   . Fatigue   . GERD (gastroesophageal reflux disease)   . Hyperlipidemia   . Hypertension    familiar  . Hypothyroidism   . IBS (irritable bowel syndrome)    hx  . Keratosis, seborrheic   . Labyrinthitis   . MI (myocardial infarction) ([place])    age 44  . Osteoarthritis   . S/P arthroscopy of right shoulder 03/01/2018  . Skin cancer    Melanoma     Past Surgical History:  Procedure Laterality Date  . Breast Biopsy     dense breast tissue  . BREAST EXCISIONAL BIOPSY Left 2000   benign  . CHOLECYSTECTOMY    . EUS  04/04/2012   Procedure: UPPER ENDOSCOPIC ULTRASOUND (EUS) LINEAR;  Surgeon: Milus Banister, MD;  [facility];  Service: Endoscopy;  Laterality: N/A;  radial linear  . KNEE ARTHROPLASTY     total right  02-28-2010 Dr Pilar Plate Aluisio, left  . KNEE ARTHROPLASTY Bilateral 09/12/2017   Hector Shade  . LEFT HEART CATH AND CORONARY ANGIOGRAPHY N/A 04/20/2017   Procedure: LEFT HEART CATH AND CORONARY ANGIOGRAPHY;  Surgeon: Burnell Blanks, MD;  Location: [facility];  Service: Cardiovascular;  Laterality: N/A;  . TUBAL LIGATION      There were no vitals filed for this visit.  Subjective Assessment - 07/02/18 1358    Subjective  "okay", "about the same"    Currently in Pain?  Yes    Pain Score  2     Pain Location  Neck                       [facility] Adult PT Treatment/Exercise - 07/02/18 0001      Neck Exercises: Machines for Strengthening   UBE (Upper Arm Bike)  L 3 2 fwd/2 back    Cybex Row  15# 2 sets 10     Lat Pull  15# 2 sets 10      Neck Exercises: Standing   Neck Retraction  15 reps;3 secs   head on ball   Wall Push Ups  15 reps    Other Standing Exercises  4# shruggs and rolls 15 each      Modalities   Modalities  Traction      Moist Heat Therapy   Number Minutes Moist Heat  15 Minutes    Moist Heat Location  Cervical      Electrical Stimulation   Electrical Stimulation Location  BIL cerv/trap    Electrical Stimulation Action  IFC    Electrical Stimulation Goals  Pain      Ultrasound   Ultrasound  Location  cerv/trap with estim    Ultrasound Parameters  COMBO    Ultrasound Goals  Pain   tightness     Traction   Type of Traction  Cervical    Max (lbs)  10    Time  15      Manual Therapy   Manual Therapy  Soft tissue mobilization    Soft tissue mobilization  cerv/traps and rhomboids               PT Short Term Goals - 06/25/18 0910      PT SHORT TERM GOAL #1   Title  independent with initial HEP    Status  Achieved        PT Long Term Goals - 06/18/18 1532      PT LONG TERM GOAL #1   Title  decrease pain 50% for ADLs    Time  8    Period  Weeks    Status  New      PT LONG TERM GOAL #2   Title  increase AROM of the right shoulder to 170 degrees flexion    Time  8    Period  Weeks    Status  New      PT LONG TERM GOAL #3   Title  increase cervical ROM to WNL's    Time  8    Period  Weeks    Status  New      PT LONG TERM GOAL #4   Title  resume gym program safely    Time  8    Period  Weeks    Status  New            Plan - 07/02/18 1454    Clinical Impression Statement  tolerated initial progression of ther ex well. some pain with mvmt but tolerable. less  tightness throughout with increased cerv ROM but TP still present    PT Treatment/Interventions  Cryotherapy;Electrical Stimulation;Iontophoresis 4mg /ml Dexamethasone;Moist Heat;Therapeutic activities;Therapeutic exercise;Patient/family education;Manual techniques;Vasopneumatic Device;Passive range of motion;Traction;Ultrasound;Dry needling    PT Next Visit Plan  progress as tolerates       Patient will benefit from skilled therapeutic intervention in order to improve the following deficits and impairments:  Increased edema, Decreased activity tolerance, Decreased strength, Impaired UE functional use, Pain, Increased muscle spasms, Decreased range of motion, Postural dysfunction, Improper body mechanics, Impaired flexibility  Visit Diagnosis: Cervicalgia     Problem List Patient Active Problem List   Diagnosis Date Noted  . Osteoporosis 12/25/2017  . Coronary artery disease   . Dysuria 02/17/2017  . Diarrhea 02/17/2017  . Vitamin D deficiency 11/07/2016  . Acute on chronic diastolic CHF (congestive heart failure), NYHA class 1 ([place]) 11/18/2015  . Diabetes mellitus without complication ([place]) 30/86/5784  . Reactive airway disease with acute exacerbation 09/10/2014  . Nonspecific (abnormal) findings on radiological and other examination of gastrointestinal tract 04/04/2012  . Insomnia, idiopathic 10/04/2010  . Hyperlipidemia 09/22/2009  . Essential hypertension 09/22/2009  . CAD, NATIVE VESSEL 09/22/2009  . EDEMA 05/19/2009  . Hypothyroidism 02/23/2009  . EXTRINSIC ASTHMA, UNSPECIFIED 02/10/2009  . KERATOSIS, SEBORRHEIC [place] 04/16/2007  . SKIN CANCER, HX OF 02/14/2007  . Depression 01/24/2007  . MYOCARDIAL INFARCTION, HX OF 01/24/2007  . GERD 01/24/2007  . Osteoarthritis 01/24/2007  . COLONIC POLYPS, HX OF 01/24/2007  . IRRITABLE BOWEL SYNDROME, HX OF 01/24/2007    PAYSEUR,ANGIE  PTA 07/02/2018, 3:00 PM  [facility]  [place], [place], [address] Phone: [phone]   Fax:  [phone]  Name: Rebecca [name] MRN: [id number] Date of Birth: 15-Mar-1946

## 2018-07-04 ENCOUNTER — Ambulatory Visit: Payer: Medicare Other | Admitting: Physical Therapy

## 2018-07-04 DIAGNOSIS — M542 Cervicalgia: Secondary | ICD-10-CM | POA: Diagnosis not present

## 2018-07-04 DIAGNOSIS — M25611 Stiffness of right shoulder, not elsewhere classified: Secondary | ICD-10-CM | POA: Diagnosis not present

## 2018-07-04 DIAGNOSIS — R600 Localized edema: Secondary | ICD-10-CM | POA: Diagnosis not present

## 2018-07-04 DIAGNOSIS — M25511 Pain in right shoulder: Secondary | ICD-10-CM | POA: Diagnosis not present

## 2018-07-04 NOTE — Therapy (Signed)
[facility] [place], [place], [address] Phone: [phone]   Fax:  [phone]  Physical Therapy Treatment  Patient Details  Name: Rebecca [name] MRN: [id number] Date of Birth: 1945/08/02 Referring Provider (PT): Jerilynn Mages   Encounter Date: 07/04/2018  PT End of Session - 07/04/18 1403    Visit Number  5    Date for PT Re-Evaluation  08/19/18    PT Start Time  1320    PT Stop Time  1415    PT Time Calculation (min)  55 min       Past Medical History:  Diagnosis Date  . Asthma    extrinsic  . CAD (coronary artery disease)   . Chest pain, atypical   . Diabetes ([place])   . Edema   . Fatigue   . Fatigue   . GERD (gastroesophageal reflux disease)   . Hyperlipidemia   . Hypertension    familiar  . Hypothyroidism   . IBS (irritable bowel syndrome)    hx  . Keratosis, seborrheic   . Labyrinthitis   . MI (myocardial infarction) ([place])    age 72  . Osteoarthritis   . S/P arthroscopy of right shoulder 03/01/2018  . Skin cancer    Melanoma     Past Surgical History:  Procedure Laterality Date  . Breast Biopsy     dense breast tissue  . BREAST EXCISIONAL BIOPSY Left 2000   benign  . CHOLECYSTECTOMY    . EUS  04/04/2012   Procedure: UPPER ENDOSCOPIC ULTRASOUND (EUS) LINEAR;  Surgeon: Milus Banister, MD;  [facility];  Service: Endoscopy;  Laterality: N/A;  radial linear  . KNEE ARTHROPLASTY     total right  02-28-2010 Dr Pilar Plate Aluisio, left  . KNEE ARTHROPLASTY Bilateral 09/12/2017   Hector Shade  . LEFT HEART CATH AND CORONARY ANGIOGRAPHY N/A 04/20/2017   Procedure: LEFT HEART CATH AND CORONARY ANGIOGRAPHY;  Surgeon: Burnell Blanks, MD;  Location: [facility];  Service: Cardiovascular;  Laterality: N/A;  . TUBAL LIGATION      There were no vitals filed for this visit.  Subjective Assessment - 07/04/18 1330    Subjective  sleeping better. numbness and pain  in RT hand    Currently in Pain?  Yes         [facility] PT Assessment - 07/04/18 0001      AROM   Overall AROM Comments  WFLS except left rotation decreased 25%    Right Shoulder Flexion  165 Degrees    Right Shoulder ABduction  165 Degrees    Right Shoulder Internal Rotation  82 Degrees    Right Shoulder External Rotation  70 Degrees                   [facility] Adult PT Treatment/Exercise - 07/04/18 0001      Neck Exercises: Machines for Strengthening   UBE (Upper Arm Bike)  L 3 2 fwd/2 back      Neck Exercises: Theraband   Other Theraband Exercises  red tband scap stab 15 reps 4 way      Neck Exercises: Standing   Neck Retraction  15 reps;3 secs   head on ball. 2# UE stab ex 10 each     Moist Heat Therapy   Number Minutes Moist Heat  15 Minutes    Moist Heat Location  Cervical      Electrical Stimulation   Electrical  Stimulation Location  BIL cerv/trap    Electrical Stimulation Action  IFC    Electrical Stimulation Goals  Pain      Ultrasound   Ultrasound Location  cerv/trap cpmbo with estim    Ultrasound Parameters  COMBO    Ultrasound Goals  Pain   tightness     Traction   Type of Traction  Cervical    Max (lbs)  10    Time  15      Manual Therapy   Manual therapy comments  pt is negative for neural tension and carpal tunnel but c/o pain/tingling in hand               PT Short Term Goals - 06/25/18 0910      PT SHORT TERM GOAL #1   Title  independent with initial HEP    Status  Achieved        PT Long Term Goals - 07/04/18 1330      PT LONG TERM GOAL #1   Title  decrease pain 50% for ADLs    Status  Partially Met      PT LONG TERM GOAL #2   Title  increase AROM of the right shoulder to 170 degrees flexion    Status  Partially Met      PT LONG TERM GOAL #3   Title  increase cervical ROM to WNL's    Status  Partially Met      PT LONG TERM GOAL #4   Title  resume gym program safely    Status  Partially Met      PT LONG TERM  GOAL #5   Title  put 3# on head high shelf    Status  Partially Met            Plan - 07/04/18 1403    Clinical Impression Statement  pt making great progress with increased motion, decreased tightness and decreased pain. this week we have resumed some ther ex and she is tolerating well. pt still with c/o numbness/tingling in RT hand - appears to be negative for NT and carpal tunnel    PT Treatment/Interventions  Cryotherapy;Electrical Stimulation;Iontophoresis '4mg'$ /ml Dexamethasone;Moist Heat;Therapeutic activities;Therapeutic exercise;Patient/family education;Manual techniques;Vasopneumatic Device;Passive range of motion;Traction;Ultrasound;Dry needling    PT Next Visit Plan  sent MD note with pt       Patient will benefit from skilled therapeutic intervention in order to improve the following deficits and impairments:  Increased edema, Decreased activity tolerance, Decreased strength, Impaired UE functional use, Pain, Increased muscle spasms, Decreased range of motion, Postural dysfunction, Improper body mechanics, Impaired flexibility  Visit Diagnosis: Cervicalgia     Problem List Patient Active Problem List   Diagnosis Date Noted  . Osteoporosis 12/25/2017  . Coronary artery disease   . Dysuria 02/17/2017  . Diarrhea 02/17/2017  . Vitamin D deficiency 11/07/2016  . Acute on chronic diastolic CHF (congestive heart failure), NYHA class 1 ([place]) 11/18/2015  . Diabetes mellitus without complication ([place]) 26/71/2458  . Reactive airway disease with acute exacerbation 09/10/2014  . Nonspecific (abnormal) findings on radiological and other examination of gastrointestinal tract 04/04/2012  . Insomnia, idiopathic 10/04/2010  . Hyperlipidemia 09/22/2009  . Essential hypertension 09/22/2009  . CAD, NATIVE VESSEL 09/22/2009  . EDEMA 05/19/2009  . Hypothyroidism 02/23/2009  . EXTRINSIC ASTHMA, UNSPECIFIED 02/10/2009  . KERATOSIS, SEBORRHEIC [place] 04/16/2007  . SKIN CANCER, HX OF  02/14/2007  . Depression 01/24/2007  . MYOCARDIAL INFARCTION, HX OF 01/24/2007  . GERD 01/24/2007  .  Osteoarthritis 01/24/2007  . COLONIC POLYPS, HX OF 01/24/2007  . IRRITABLE BOWEL SYNDROME, HX OF 01/24/2007    Christobal Morado,ANGIE PTA 07/04/2018, 2:10 PM  [facility] [place] [address] [place], [place], [address] Phone: [phone]   Fax:  [phone]  Name: MAHROSH DONNELL MRN: [id number] Date of Birth: [birth year]

## 2018-07-05 NOTE — Research (Signed)
Late entry:  Subject met inclusion and exclusion criteria.  The informed consent form, study requirements and expectations were reviewed with the subject and questions and concerns were addressed prior to the signing of the consent form.  The subject verbalized understanding of the trial requirements.  The subject agreed to participate in the [place] [facility]  trial and signed the informed consent.  The informed consent was obtained prior to performance of any protocol-specific procedures for the subject.  A copy of the signed informed consent was given to the subject and a copy was placed in the subject's medical record.  Subject screen failed for this study due to TC <155.  Will keep name in database for future studies.

## 2018-07-08 ENCOUNTER — Other Ambulatory Visit: Payer: Self-pay | Admitting: Adult Health

## 2018-07-08 DIAGNOSIS — E119 Type 2 diabetes mellitus without complications: Secondary | ICD-10-CM

## 2018-07-08 DIAGNOSIS — Z9889 Other specified postprocedural states: Secondary | ICD-10-CM | POA: Diagnosis not present

## 2018-07-08 DIAGNOSIS — M19011 Primary osteoarthritis, right shoulder: Secondary | ICD-10-CM | POA: Diagnosis not present

## 2018-07-08 DIAGNOSIS — M542 Cervicalgia: Secondary | ICD-10-CM | POA: Diagnosis not present

## 2018-07-08 DIAGNOSIS — G4700 Insomnia, unspecified: Secondary | ICD-10-CM

## 2018-07-09 ENCOUNTER — Ambulatory Visit: Payer: Medicare Other | Admitting: Physical Therapy

## 2018-07-09 DIAGNOSIS — M542 Cervicalgia: Secondary | ICD-10-CM

## 2018-07-09 DIAGNOSIS — R600 Localized edema: Secondary | ICD-10-CM | POA: Diagnosis not present

## 2018-07-09 DIAGNOSIS — M25511 Pain in right shoulder: Secondary | ICD-10-CM | POA: Diagnosis not present

## 2018-07-09 DIAGNOSIS — M25611 Stiffness of right shoulder, not elsewhere classified: Secondary | ICD-10-CM | POA: Diagnosis not present

## 2018-07-09 NOTE — Therapy (Signed)
[place], [place], [address] Phone: [phone]   Fax:  [phone]  Physical Therapy Treatment  Patient Details  Name: Rebecca [name] MRN: [id number] Date of Birth: 08/02/1945 Referring Provider (PT): Jerilynn Mages   Encounter Date: 07/09/2018  PT End of Session - 07/09/18 1129    Visit Number  6    Date for PT Re-Evaluation  08/19/18    PT Start Time  1045    PT Stop Time  1135    PT Time Calculation (min)  50 min       Past Medical History:  Diagnosis Date  . Asthma    extrinsic  . CAD (coronary artery disease)   . Chest pain, atypical   . Diabetes ([place])   . Edema   . Fatigue   . Fatigue   . GERD (gastroesophageal reflux disease)   . Hyperlipidemia   . Hypertension    familiar  . Hypothyroidism   . IBS (irritable bowel syndrome)    hx  . Keratosis, seborrheic   . Labyrinthitis   . MI (myocardial infarction) ([place])    age 72  . Osteoarthritis   . S/P arthroscopy of right shoulder 03/01/2018  . Skin cancer    Melanoma     Past Surgical History:  Procedure Laterality Date  . Breast Biopsy     dense breast tissue  . BREAST EXCISIONAL BIOPSY Left 2000   benign  . CHOLECYSTECTOMY    . EUS  04/04/2012   Procedure: UPPER ENDOSCOPIC ULTRASOUND (EUS) LINEAR;  Surgeon: Milus Banister, MD;  [facility];  Service: Endoscopy;  Laterality: N/A;  radial linear  . KNEE ARTHROPLASTY     total right  02-28-2010 Dr Pilar Plate Aluisio, left  . KNEE ARTHROPLASTY Bilateral 09/12/2017   Hector Shade  . LEFT HEART CATH AND CORONARY ANGIOGRAPHY N/A 04/20/2017   Procedure: LEFT HEART CATH AND CORONARY ANGIOGRAPHY;  Surgeon: Burnell Blanks, MD;  Location: [facility];  Service: Cardiovascular;  Laterality: N/A;  . TUBAL LIGATION      There were no vitals filed for this visit.  Subjective Assessment - 07/09/18 1127    Subjective  pt saw MD yesterday an drecieved  cortisone injection in shld as he felt it had "too many hot spots"    Currently in Pain?  Yes    Pain Score  4     Pain Location  Neck    Pain Orientation  Right                       [facility] Adult PT Treatment/Exercise - 07/09/18 0001      Moist Heat Therapy   Number Minutes Moist Heat  15 Minutes    Moist Heat Location  Cervical      Electrical Stimulation   Electrical Stimulation Location  BIL cerv/trap    Electrical Stimulation Action  COMBO    Electrical Stimulation Goals  Pain      Traction   Type of Traction  Cervical    Max (lbs)  12    Time  15      Manual Therapy   Manual Therapy  Soft tissue mobilization    Manual therapy comments  trigger pt RT rhom    Soft tissue mobilization  cerv/trap and rhom               PT Short Term Goals - 06/25/18  0910      PT SHORT TERM GOAL #1   Title  independent with initial HEP    Status  Achieved        PT Long Term Goals - 07/04/18 1330      PT LONG TERM GOAL #1   Title  decrease pain 50% for ADLs    Status  Partially Met      PT LONG TERM GOAL #2   Title  increase AROM of the right shoulder to 170 degrees flexion    Status  Partially Met      PT LONG TERM GOAL #3   Title  increase cervical ROM to WNL's    Status  Partially Met      PT LONG TERM GOAL #4   Title  resume gym program safely    Status  Partially Met      PT LONG TERM GOAL #5   Title  put 3# on head high shelf    Status  Partially Met            Plan - 07/09/18 1129    Clinical Impression Statement  modalities only d/t cortisone inj in shld. tightness and trigger pts in RT rhombiod but responded well to STW    PT Treatment/Interventions  Cryotherapy;Electrical Stimulation;Iontophoresis '4mg'$ /ml Dexamethasone;Moist Heat;Therapeutic activities;Therapeutic exercise;Patient/family education;Manual techniques;Vasopneumatic Device;Passive range of motion;Traction;Ultrasound;Dry needling    PT Next Visit Plan  progress as  toelrated       Patient will benefit from skilled therapeutic intervention in order to improve the following deficits and impairments:  Increased edema, Decreased activity tolerance, Decreased strength, Impaired UE functional use, Pain, Increased muscle spasms, Decreased range of motion, Postural dysfunction, Improper body mechanics, Impaired flexibility  Visit Diagnosis: Cervicalgia     Problem List Patient Active Problem List   Diagnosis Date Noted  . Osteoporosis 12/25/2017  . Coronary artery disease   . Dysuria 02/17/2017  . Diarrhea 02/17/2017  . Vitamin D deficiency 11/07/2016  . Acute on chronic diastolic CHF (congestive heart failure), NYHA class 1 ([place]) 11/18/2015  . Diabetes mellitus without complication ([place]) 32/41/9914  . Reactive airway disease with acute exacerbation 09/10/2014  . Nonspecific (abnormal) findings on radiological and other examination of gastrointestinal tract 04/04/2012  . Insomnia, idiopathic 10/04/2010  . Hyperlipidemia 09/22/2009  . Essential hypertension 09/22/2009  . CAD, NATIVE VESSEL 09/22/2009  . EDEMA 05/19/2009  . Hypothyroidism 02/23/2009  . EXTRINSIC ASTHMA, UNSPECIFIED 02/10/2009  . KERATOSIS, SEBORRHEIC [place] 04/16/2007  . SKIN CANCER, HX OF 02/14/2007  . Depression 01/24/2007  . MYOCARDIAL INFARCTION, HX OF 01/24/2007  . GERD 01/24/2007  . Osteoarthritis 01/24/2007  . COLONIC POLYPS, HX OF 01/24/2007  . IRRITABLE BOWEL SYNDROME, HX OF 01/24/2007    Shaunie Boehm,ANGIE PTA 07/09/2018, 11:31 AM  [place] [address] [place], [place], [address] Phone: [phone]   Fax:  [phone]  Name: KARISTA AISPURO MRN: [id number] Date of Birth: 09-Jul-1946

## 2018-07-09 NOTE — Telephone Encounter (Signed)
Last A1C was 6.5.  Pt had upcoming cpx in April 2020.  Please advise.

## 2018-07-11 ENCOUNTER — Ambulatory Visit: Payer: Medicare Other | Admitting: Physical Therapy

## 2018-07-11 ENCOUNTER — Other Ambulatory Visit: Payer: Self-pay | Admitting: Adult Health

## 2018-07-11 DIAGNOSIS — M25611 Stiffness of right shoulder, not elsewhere classified: Secondary | ICD-10-CM | POA: Diagnosis not present

## 2018-07-11 DIAGNOSIS — M542 Cervicalgia: Secondary | ICD-10-CM

## 2018-07-11 DIAGNOSIS — M25511 Pain in right shoulder: Secondary | ICD-10-CM

## 2018-07-11 DIAGNOSIS — R600 Localized edema: Secondary | ICD-10-CM | POA: Diagnosis not present

## 2018-07-11 MED ORDER — ACCU-CHEK FASTCLIX LANCETS MISC: 3 refills | Status: DC

## 2018-07-11 NOTE — Therapy (Signed)
[place], [place], [address] Phone: [phone]   Fax:  [phone]  Physical Therapy Treatment  Patient Details  Name: Rebecca [name] MRN: [id number] Date of Birth: 1945-07-25 Referring Provider (PT): Jerilynn Mages   Encounter Date: 07/11/2018  PT End of Session - 07/11/18 1122    Visit Number  7    Date for PT Re-Evaluation  08/19/18    PT Start Time  3382    PT Stop Time  1130    PT Time Calculation (min)  45 min       Past Medical History:  Diagnosis Date  . Asthma    extrinsic  . CAD (coronary artery disease)   . Chest pain, atypical   . Diabetes ([place])   . Edema   . Fatigue   . Fatigue   . GERD (gastroesophageal reflux disease)   . Hyperlipidemia   . Hypertension    familiar  . Hypothyroidism   . IBS (irritable bowel syndrome)    hx  . Keratosis, seborrheic   . Labyrinthitis   . MI (myocardial infarction) ([place])    age 19  . Osteoarthritis   . S/P arthroscopy of right shoulder 03/01/2018  . Skin cancer    Melanoma     Past Surgical History:  Procedure Laterality Date  . Breast Biopsy     dense breast tissue  . BREAST EXCISIONAL BIOPSY Left 2000   benign  . CHOLECYSTECTOMY    . EUS  04/04/2012   Procedure: UPPER ENDOSCOPIC ULTRASOUND (EUS) LINEAR;  Surgeon: Milus Banister, MD;  Location[facility];  Service: Endoscopy;  Laterality: N/A;  radial linear  . KNEE ARTHROPLASTY     total right  02-28-2010 Dr Pilar Plate Aluisio, left  . KNEE ARTHROPLASTY Bilateral 09/12/2017   Hector Shade  . LEFT HEART CATH AND CORONARY ANGIOGRAPHY N/A 04/20/2017   Procedure: LEFT HEART CATH AND CORONARY ANGIOGRAPHY;  Surgeon: Burnell Blanks, MD;  Location: [facility];  Service: Cardiovascular;  Laterality: N/A;  . TUBAL LIGATION      There were no vitals filed for this visit.  Subjective Assessment - 07/11/18 1047    Subjective  neck is pretty good. RT shld is  issue - I don't think cortisone shot helped    Currently in Pain?  Yes    Pain Score  3     Pain Location  Shoulder    Pain Orientation  Right;Anterior;Lateral                       [facility] Adult PT Treatment/Exercise - 07/11/18 0001      Neck Exercises: Machines for Strengthening   UBE (Upper Arm Bike)  L 3 2 fwd/2 back      Neck Exercises: Theraband   Other Theraband Exercises  red tband scap stab 15 reps 4 way      Neck Exercises: Standing   Neck Retraction  15 reps;3 secs   head on ball on wall     Modalities   Modalities  Iontophoresis      Iontophoresis   Type of Iontophoresis  Dexamethasone    Location  RT ant shld over bicep incertion    Dose  1.2 cc    Time  leave on patch      Manual Therapy   Manual Therapy  Soft tissue mobilization;Joint mobilization;Passive ROM    Manual therapy comments  tight joint  capsule, limited ROM at end range esp ER with pain, very painful bicep incertion    Joint Mobilization  Rt shld    Soft tissue mobilization  RT shld    Passive ROM  Rt shld               PT Short Term Goals - 06/25/18 0910      PT SHORT TERM GOAL #1   Title  independent with initial HEP    Status  Achieved        PT Long Term Goals - 07/11/18 1122      PT LONG TERM GOAL #1   Title  decrease pain 50% for ADLs    Status  Partially Met      PT LONG TERM GOAL #2   Title  increase AROM of the right shoulder to 170 degrees flexion    Status  Partially Met      PT LONG TERM GOAL #3   Title  increase cervical ROM to WNL's    Status  Achieved      PT LONG TERM GOAL #4   Title  resume gym program safely    Status  Partially Met      PT LONG TERM GOAL #5   Title  put 3# on head high shelf    Status  Partially Met            Plan - 07/11/18 1123    Clinical Impression Statement  progressing with goals. Cerv ROM WFLS and doing very well. shld pain and tenderness esp over bicep tendon incertion that seems to possiblyt be  bicep tendonitis. pt had lost AROM of shld and joint capsule tight- focus session more on shld as cerv was doing very well today    PT Treatment/Interventions  Cryotherapy;Electrical Stimulation;Iontophoresis '4mg'$ /ml Dexamethasone;Moist Heat;Therapeutic activities;Therapeutic exercise;Patient/family education;Manual techniques;Vasopneumatic Device;Passive range of motion;Traction;Ultrasound;Dry needling    PT Next Visit Plan  assess and progress       Patient will benefit from skilled therapeutic intervention in order to improve the following deficits and impairments:  Increased edema, Decreased activity tolerance, Decreased strength, Impaired UE functional use, Pain, Increased muscle spasms, Decreased range of motion, Postural dysfunction, Improper body mechanics, Impaired flexibility  Visit Diagnosis: Cervicalgia  Stiffness of right shoulder, not elsewhere classified  Acute pain of right shoulder     Problem List Patient Active Problem List   Diagnosis Date Noted  . Osteoporosis 12/25/2017  . Coronary artery disease   . Dysuria 02/17/2017  . Diarrhea 02/17/2017  . Vitamin D deficiency 11/07/2016  . Acute on chronic diastolic CHF (congestive heart failure), NYHA class 1 ([place]) 11/18/2015  . Diabetes mellitus without complication ([place]) 16/04/9603  . Reactive airway disease with acute exacerbation 09/10/2014  . Nonspecific (abnormal) findings on radiological and other examination of gastrointestinal tract 04/04/2012  . Insomnia, idiopathic 10/04/2010  . Hyperlipidemia 09/22/2009  . Essential hypertension 09/22/2009  . CAD, NATIVE VESSEL 09/22/2009  . EDEMA 05/19/2009  . Hypothyroidism 02/23/2009  . EXTRINSIC ASTHMA, UNSPECIFIED 02/10/2009  . KERATOSIS, SEBORRHEIC [place] 04/16/2007  . SKIN CANCER, HX OF 02/14/2007  . Depression 01/24/2007  . MYOCARDIAL INFARCTION, HX OF 01/24/2007  . GERD 01/24/2007  . Osteoarthritis 01/24/2007  . COLONIC POLYPS, HX OF 01/24/2007  . IRRITABLE  BOWEL SYNDROME, HX OF 01/24/2007    Hussain Maimone,ANGIE PTA 07/11/2018, 11:25 AM  [place], [place], [address] Phone: [phone]   Fax:  [phone]  Name: Rebecca [name] MRN: [id number] Date of Birth: September 18, 1945

## 2018-07-16 ENCOUNTER — Ambulatory Visit: Payer: Medicare Other | Admitting: Physical Therapy

## 2018-07-16 DIAGNOSIS — M542 Cervicalgia: Secondary | ICD-10-CM

## 2018-07-16 DIAGNOSIS — M25511 Pain in right shoulder: Secondary | ICD-10-CM | POA: Diagnosis not present

## 2018-07-16 DIAGNOSIS — M25611 Stiffness of right shoulder, not elsewhere classified: Secondary | ICD-10-CM | POA: Diagnosis not present

## 2018-07-16 DIAGNOSIS — R600 Localized edema: Secondary | ICD-10-CM | POA: Diagnosis not present

## 2018-07-16 NOTE — Therapy (Signed)
[place], [place], [address] Phone: [phone]   Fax:  [phone]  Physical Therapy Treatment  Patient Details  Name: Rebecca [name] MRN: [id number] Date of Birth: 04-20-1946 Referring Provider (PT): Jerilynn Mages   Encounter Date: 07/16/2018  PT End of Session - 07/16/18 1156    Visit Number  8    Date for PT Re-Evaluation  08/19/18    PT Start Time  1055    PT Stop Time  1145    PT Time Calculation (min)  50 min       Past Medical History:  Diagnosis Date  . Asthma    extrinsic  . CAD (coronary artery disease)   . Chest pain, atypical   . Diabetes ([place])   . Edema   . Fatigue   . Fatigue   . GERD (gastroesophageal reflux disease)   . Hyperlipidemia   . Hypertension    familiar  . Hypothyroidism   . IBS (irritable bowel syndrome)    hx  . Keratosis, seborrheic   . Labyrinthitis   . MI (myocardial infarction) ([place])    age 69  . Osteoarthritis   . S/P arthroscopy of right shoulder 03/01/2018  . Skin cancer    Melanoma     Past Surgical History:  Procedure Laterality Date  . Breast Biopsy     dense breast tissue  . BREAST EXCISIONAL BIOPSY Left 2000   benign  . CHOLECYSTECTOMY    . EUS  04/04/2012   Procedure: UPPER ENDOSCOPIC ULTRASOUND (EUS) LINEAR;  Surgeon: Milus Banister, MD;  [facility];  Service: Endoscopy;  Laterality: N/A;  radial linear  . KNEE ARTHROPLASTY     total right  02-28-2010 Dr Pilar Plate Aluisio, left  . KNEE ARTHROPLASTY Bilateral 09/12/2017   Hector Shade  . LEFT HEART CATH AND CORONARY ANGIOGRAPHY N/A 04/20/2017   Procedure: LEFT HEART CATH AND CORONARY ANGIOGRAPHY;  Surgeon: Burnell Blanks, MD;  Location: [facility];  Service: Cardiovascular;  Laterality: N/A;  . TUBAL LIGATION      There were no vitals filed for this visit.  Subjective Assessment - 07/16/18 1110    Subjective  much bteer since inoto and tape.     Currently in Pain?  Yes    Pain Score  2     Pain Location  Shoulder    Pain Orientation  Right                       [facility] Adult PT Treatment/Exercise - 07/16/18 0001      Neck Exercises: Theraband   Other Theraband Exercises  red tband scap stab 15 reps 4 way      Neck Exercises: Standing   Neck Retraction  15 reps;3 secs      Shoulder Exercises: Standing   Other Standing Exercises  ball vs wall 5 x CC and CCW    Other Standing Exercises  2# shld ex 10 reps 5 ways      Shoulder Exercises: ROM/Strengthening   UBE (Upper Arm Bike)  L 4 3 fwd/3back      Iontophoresis   Type of Iontophoresis  Dexamethasone    Location  RT ant shld over bicep incertion    Dose  1.2 cc    Time  leave on patch      Manual Therapy   Manual Therapy  Soft tissue mobilization;Joint mobilization;Passive ROM  Manual therapy comments  much improved    Joint Mobilization  RT shld    Soft tissue mobilization  RT shld   STW to TP in trap and rhomboid and delt   Passive ROM  Rt shld    Kinesiotex  Create Space   I strip trap and rhom, plus lantern over delt              PT Short Term Goals - 06/25/18 0910      PT SHORT TERM GOAL #1   Title  independent with initial HEP    Status  Achieved        PT Long Term Goals - 07/11/18 1122      PT LONG TERM GOAL #1   Title  decrease pain 50% for ADLs    Status  Partially Met      PT LONG TERM GOAL #2   Title  increase AROM of the right shoulder to 170 degrees flexion    Status  Partially Met      PT LONG TERM GOAL #3   Title  increase cervical ROM to WNL's    Status  Achieved      PT LONG TERM GOAL #4   Title  resume gym program safely    Status  Partially Met      PT LONG TERM GOAL #5   Title  put 3# on head high shelf    Status  Partially Met            Plan - 07/16/18 1157    Clinical Impression Statement  resumed ex today and tolerated well. noted increase in ROM and less pain. responding well to  ionto and tape    PT Treatment/Interventions  Cryotherapy;Electrical Stimulation;Iontophoresis 6m/ml Dexamethasone;Moist Heat;Therapeutic activities;Therapeutic exercise;Patient/family education;Manual techniques;Vasopneumatic Device;Passive range of motion;Traction;Ultrasound;Dry needling    PT Next Visit Plan  assess and progress       Patient will benefit from skilled therapeutic intervention in order to improve the following deficits and impairments:  Increased edema, Decreased activity tolerance, Decreased strength, Impaired UE functional use, Pain, Increased muscle spasms, Decreased range of motion, Postural dysfunction, Improper body mechanics, Impaired flexibility  Visit Diagnosis: Cervicalgia  Stiffness of right shoulder, not elsewhere classified  Acute pain of right shoulder     Problem List Patient Active Problem List   Diagnosis Date Noted  . Osteoporosis 12/25/2017  . Coronary artery disease   . Dysuria 02/17/2017  . Diarrhea 02/17/2017  . Vitamin D deficiency 11/07/2016  . Acute on chronic diastolic CHF (congestive heart failure), NYHA class 1 [place] 11/18/2015  . Diabetes mellitus without complication ([place] 098/33/8250 . Reactive airway disease with acute exacerbation 09/10/2014  . Nonspecific (abnormal) findings on radiological and other examination of gastrointestinal tract 04/04/2012  . Insomnia, idiopathic 10/04/2010  . Hyperlipidemia 09/22/2009  . Essential hypertension 09/22/2009  . CAD, NATIVE VESSEL 09/22/2009  . EDEMA 05/19/2009  . Hypothyroidism 02/23/2009  . EXTRINSIC ASTHMA, UNSPECIFIED 02/10/2009  . KERATOSIS, SEBORRHEIC [place]09/30/2008  . SKIN CANCER, HX OF 02/14/2007  . Depression 01/24/2007  . MYOCARDIAL INFARCTION, HX OF 01/24/2007  . GERD 01/24/2007  . Osteoarthritis 01/24/2007  . COLONIC POLYPS, HX OF 01/24/2007  . IRRITABLE BOWEL SYNDROME, HX OF 01/24/2007    Mitchelle Sultan,ANGIE 07/16/2018, 11:58 AM  [address] [address]Phone: [phone]  Fax:  [phone] Name: Rebecca DOUGLASMRN: [id number] of Birth:  [birth year]

## 2018-07-18 ENCOUNTER — Ambulatory Visit: Payer: Medicare Other | Attending: Orthopedic Surgery | Admitting: Physical Therapy

## 2018-07-18 DIAGNOSIS — M25511 Pain in right shoulder: Secondary | ICD-10-CM | POA: Diagnosis not present

## 2018-07-18 DIAGNOSIS — M542 Cervicalgia: Secondary | ICD-10-CM | POA: Diagnosis not present

## 2018-07-18 NOTE — Therapy (Addendum)
[place], [place], [address] Phone: [phone]   Fax:  [phone]  Physical Therapy Treatment  Patient Details  Name: Rebecca [name] MRN: [id number] Date of Birth: 12/23/1945 Referring Provider (PT): Jerilynn Mages   Encounter Date: 07/18/2018  PT End of Session - 07/18/18 1141    Visit Number  9    Date for PT Re-Evaluation  08/19/18    PT Start Time  1100    PT Stop Time  1200    PT Time Calculation (min)  60 min       Past Medical History:  Diagnosis Date  . Asthma    extrinsic  . CAD (coronary artery disease)   . Chest pain, atypical   . Diabetes ([place])   . Edema   . Fatigue   . Fatigue   . GERD (gastroesophageal reflux disease)   . Hyperlipidemia   . Hypertension    familiar  . Hypothyroidism   . IBS (irritable bowel syndrome)    hx  . Keratosis, seborrheic   . Labyrinthitis   . MI (myocardial infarction) ([place])    age 73  . Osteoarthritis   . S/P arthroscopy of right shoulder 03/01/2018  . Skin cancer    Melanoma     Past Surgical History:  Procedure Laterality Date  . Breast Biopsy     dense breast tissue  . BREAST EXCISIONAL BIOPSY Left 2000   benign  . CHOLECYSTECTOMY    . EUS  04/04/2012   Procedure: UPPER ENDOSCOPIC ULTRASOUND (EUS) LINEAR;  Surgeon: Milus Banister, MD;  [facility];  Service: Endoscopy;  Laterality: N/A;  radial linear  . KNEE ARTHROPLASTY     total right  02-28-2010 Dr Pilar Plate Aluisio, left  . KNEE ARTHROPLASTY Bilateral 09/12/2017   Hector Shade  . LEFT HEART CATH AND CORONARY ANGIOGRAPHY N/A 04/20/2017   Procedure: LEFT HEART CATH AND CORONARY ANGIOGRAPHY;  Surgeon: Burnell Blanks, MD;  Location: [facility];  Service: Cardiovascular;  Laterality: N/A;  . TUBAL LIGATION      There were no vitals filed for this visit.  Subjective Assessment - 07/18/18 1120    Subjective  so sore in trap and when it acts up  shld hurts, neck is doing well    Currently in Pain?  Yes    Pain Score  5     Pain Location  Shoulder    Pain Orientation  Right                       [facility] Adult PT Treatment/Exercise - 07/18/18 0001      Electrical Stimulation   Electrical Stimulation Location  RT cerv/trap/rhom    Electrical Stimulation Action  COMBO    Electrical Stimulation Parameters  1.2 w/cm 2 100%    Electrical Stimulation Goals  Pain   tightness     Iontophoresis   Type of Iontophoresis  Dexamethasone    Location  RT ant shld over bicep incertion    Dose  1.2 cc    Time  leave on patch      Manual Therapy   Manual Therapy  Soft tissue mobilization    Manual therapy comments  very tender and TP noted    Soft tissue mobilization  RT trap/rhom and delt       Trigger Point Dry Needling - 07/18/18 1150    Consent Given?  Yes  Upper Trapezius Response  Twitch reponse elicited    Levator Scapulae Response  Twitch response elicited    Rhomboids Response  Palpable increased muscle length             PT Short Term Goals - 06/25/18 0910      PT SHORT TERM GOAL #1   Title  independent with initial HEP    Status  Achieved        PT Long Term Goals - 07/11/18 1122      PT LONG TERM GOAL #1   Title  decrease pain 50% for ADLs    Status  Partially Met      PT LONG TERM GOAL #2   Title  increase AROM of the right shoulder to 170 degrees flexion    Status  Partially Met      PT LONG TERM GOAL #3   Title  increase cervical ROM to WNL's    Status  Achieved      PT LONG TERM GOAL #4   Title  resume gym program safely    Status  Partially Met      PT LONG TERM GOAL #5   Title  put 3# on head high shelf    Status  Partially Met            Plan - 07/18/18 1141    Clinical Impression Statement  pt did very well last session and then arrives today with TP in trap and rhomboids causes increase dpian ( pt relates could possibly be stress) pt verb neck is better and  shld only hurts when theses TP flare up. progressing with goals    PT Treatment/Interventions  Cryotherapy;Electrical Stimulation;Iontophoresis 30m/ml Dexamethasone;Moist Heat;Therapeutic activities;Therapeutic exercise;Patient/family education;Manual techniques;Vasopneumatic Device;Passive range of motion;Traction;Ultrasound;Dry needling    PT Next Visit Plan  assess and progress       Patient will benefit from skilled therapeutic intervention in order to improve the following deficits and impairments:  Increased edema, Decreased activity tolerance, Decreased strength, Impaired UE functional use, Pain, Increased muscle spasms, Decreased range of motion, Postural dysfunction, Improper body mechanics, Impaired flexibility  Visit Diagnosis: Cervicalgia  Acute pain of right shoulder     Problem List Patient Active Problem List   Diagnosis Date Noted  . Osteoporosis 12/25/2017  . Coronary artery disease   . Dysuria 02/17/2017  . Diarrhea 02/17/2017  . Vitamin D deficiency 11/07/2016  . Acute on chronic diastolic CHF (congestive heart failure), NYHA class 1 ([place] 11/18/2015  . Diabetes mellitus without complication ([place] 033/82/5053 . Reactive airway disease with acute exacerbation 09/10/2014  . Nonspecific (abnormal) findings on radiological and other examination of gastrointestinal tract 04/04/2012  . Insomnia, idiopathic 10/04/2010  . Hyperlipidemia 09/22/2009  . Essential hypertension 09/22/2009  . CAD, NATIVE VESSEL 09/22/2009  . EDEMA 05/19/2009  . Hypothyroidism 02/23/2009  . EXTRINSIC ASTHMA, UNSPECIFIED 02/10/2009  . KERATOSIS, [place]ORRHEIC [place]09/30/2008  . SKIN CANCER, HX OF 02/14/2007  . Depression 01/24/2007  . MYOCARDIAL INFARCTION, HX OF 01/24/2007  . GERD 01/24/2007  . Osteoarthritis 01/24/2007  . COLONIC POLYPS, HX OF 01/24/2007  . IRRITABLE BOWEL SYNDROME, HX OF 01/24/2007   PHYSICAL THERAPY DISCHARGE SUMMARY   Plan: Patient agrees to discharge.  Patient  goals were partially met. Patient is being discharged due to being pleased with the current functional level.  ?????      ASumner Boast, PT 07/18/2018, 11:51 AM  [place], [place], [address] Phone: [phone]   Fax:  [phone]  Name: Rebecca [name] MRN: [id number] Date of Birth: 08-23-1945

## 2018-07-23 ENCOUNTER — Ambulatory Visit: Payer: Medicare Other | Admitting: Physical Therapy

## 2018-07-29 ENCOUNTER — Ambulatory Visit: Payer: Medicare Other | Admitting: Cardiology

## 2018-08-06 ENCOUNTER — Ambulatory Visit: Payer: Medicare Other | Admitting: Physical Therapy

## 2018-08-08 ENCOUNTER — Ambulatory Visit: Payer: Medicare Other | Admitting: Physical Therapy

## 2018-08-09 DIAGNOSIS — M25511 Pain in right shoulder: Secondary | ICD-10-CM | POA: Diagnosis not present

## 2018-08-09 DIAGNOSIS — Z9889 Other specified postprocedural states: Secondary | ICD-10-CM | POA: Diagnosis not present

## 2018-08-13 ENCOUNTER — Ambulatory Visit: Payer: Medicare Other | Admitting: Physical Therapy

## 2018-08-15 ENCOUNTER — Ambulatory Visit: Payer: Medicare Other | Admitting: Physical Therapy

## 2018-08-20 ENCOUNTER — Encounter: Payer: Medicare Other | Admitting: Physical Therapy

## 2018-08-21 NOTE — Progress Notes (Deleted)
Patient ID: Rebecca [name], female   DOB: 22-May-1946, 73 y.o.   MRN: [id number]    Patient ID: Rebecca [name], female   DOB: 10-13-1945, 73 y.o.   MRN: [id number]  Patient Name: Rebecca [name] Date of Encounter: 08/21/2018  Primary Care Provider:  [name], NP Primary Cardiologist:  Ena Dawley  Problem List   Past Medical History:  Diagnosis Date  . Asthma    extrinsic  . CAD (coronary artery disease)   . Chest pain, atypical   . Diabetes ([place])   . Edema   . Fatigue   . Fatigue   . GERD (gastroesophageal reflux disease)   . Hyperlipidemia   . Hypertension    familiar  . Hypothyroidism   . IBS (irritable bowel syndrome)    hx  . Keratosis, seborrheic   . Labyrinthitis   . MI (myocardial infarction) ([place])    age 40  . Osteoarthritis   . S/P arthroscopy of right shoulder 03/01/2018  . Skin cancer    Melanoma    Past Surgical History:  Procedure Laterality Date  . Breast Biopsy     dense breast tissue  . BREAST EXCISIONAL BIOPSY Left 2000   benign  . CHOLECYSTECTOMY    . EUS  04/04/2012   Procedure: UPPER ENDOSCOPIC ULTRASOUND (EUS) LINEAR;  Surgeon: Milus Banister, MD;  Location: [facility];  Service: Endoscopy;  Laterality: N/A;  radial linear  . KNEE ARTHROPLASTY     total right  02-28-2010 Dr Pilar Plate Aluisio, left  . KNEE ARTHROPLASTY Bilateral 09/12/2017   Hector Shade  . LEFT HEART CATH AND CORONARY ANGIOGRAPHY N/A 04/20/2017   Procedure: LEFT HEART CATH AND CORONARY ANGIOGRAPHY;  Surgeon: Burnell Blanks, MD;  Location: [facility];  Service: Cardiovascular;  Laterality: N/A;  . TUBAL LIGATION      Allergies  Allergies  Allergen Reactions  . Sulfa Antibiotics   . Sulfonamide Derivatives     REACTION: Kathreen Cosier syndrome    Chief complain: Follow up for CAD  HPI  73 year old female with h/o CAD, MI in 1992, cath x 3, last time in 2010 with PCI to the RCA. She has been doing well, travelling a lot recently, she  exercises almost every day.  January 28, 2018 -the patient is coming after 9 months, she has been doing great, she has been walking and going to gym without any episodes of chest pain or shortness of breath.  She has been compliant with her medications with no side effects.  Her biggest problem right now is chronic right shoulder pain for which she will require reconstructive surgery by Dr. Theda Sers in the near future.  She brings preoperative evaluation for with her.  08/23/2018 -this is 6 months follow-up, the patient states that   Home Medications  Prior to Admission medications   Medication Sig Start Date End Date Taking? Authorizing Provider  acetaminophen (TYLENOL) 325 MG tablet Take 650 mg by mouth every 6 (six) hours as needed.      Historical Provider, MD  albuterol (PROVENTIL HFA) 108 (90 BASE) MCG/ACT inhaler Inhale 2 puffs into the lungs every 6 (six) hours as needed for wheezing. 05/16/12   Dorena Cookey, MD  albuterol (PROVENTIL HFA;VENTOLIN HFA) 108 (90 BASE) MCG/ACT inhaler Inhale 2 puffs into the lungs every 6 (six) hours as needed for wheezing.    Historical Provider, MD  albuterol (PROVENTIL) (2.5 MG/3ML) 0.083% nebulizer solution Take 3 mLs (2.5 mg total) by nebulization 4 (  four) times daily. 05/16/12   Dorena Cookey, MD  aspirin 325 MG tablet Take 325 mg by mouth daily.      Historical Provider, MD  atorvastatin (LIPITOR) 80 MG tablet Take 1 tablet (80 mg total) by mouth daily. 05/16/12   Dorena Cookey, MD  beclomethasone (QVAR) 80 MCG/ACT inhaler Inhale 2 puffs into the lungs 2 (two) times daily. 05/16/12   Dorena Cookey, MD  beclomethasone (QVAR) 80 MCG/ACT inhaler Inhale 1 puff into the lungs 2 (two) times daily as needed. Shortness of breath    Historical Provider, MD  calcium carbonate (OS-CAL) 600 MG TABS Take 600 mg by mouth 2 (two) times daily with a meal.      Historical Provider, MD  calcium carbonate (OS-CAL) 600 MG TABS Take 600 mg by mouth daily.    Historical  Provider, MD  cetirizine (ZYRTEC) 10 MG tablet Take 10 mg by mouth daily.      Historical Provider, MD  cetirizine (ZYRTEC) 10 MG tablet Take 10 mg by mouth daily as needed for allergies.    Historical Provider, MD  Cholecalciferol (VITAMIN D) 2000 UNITS CAPS Take 1 capsule by mouth daily.      Historical Provider, MD  conjugated estrogens (PREMARIN) vaginal cream Place 1.61 Applicatorfuls vaginally every 7 (seven) days. weekly 05/16/12   Dorena Cookey, MD  conjugated estrogens (PREMARIN) vaginal cream Place 0.5 g vaginally daily.    Historical Provider, MD  diazepam (VALIUM) 5 MG tablet Take 1 tablet (5 mg total) by mouth every 6 (six) hours as needed for anxiety. 10/17/12   Hoy Morn, MD  diltiazem (CARDIZEM CD) 180 MG 24 hr capsule Take 1 capsule (180 mg total) by mouth daily. 05/16/12   Dorena Cookey, MD  diltiazem (DILACOR XR) 180 MG 24 hr capsule Take 180 mg by mouth daily.    Historical Provider, MD  furosemide (LASIX) 20 MG tablet Take 1 tablet (20 mg total) by mouth 2 (two) times daily as needed. 05/23/12   Dorena Cookey, MD  HYDROcodone-acetaminophen (VICODIN ES) 7.5-750 MG per tablet One half to one tablet 3 times daily when necessary for pain 07/03/12   Dorena Cookey, MD  hyoscyamine (LEVSIN SL) 0.125 MG SL tablet Place 1 tablet (0.125 mg total) under the tongue every 4 (four) hours as needed. 05/16/12   Dorena Cookey, MD  ibuprofen (ADVIL,MOTRIN) 200 MG tablet Take 600 mg by mouth every 6 (six) hours as needed for pain.    Historical Provider, MD  levothyroxine (SYNTHROID, LEVOTHROID) 50 MCG tablet Take 1 tablet (50 mcg total) by mouth daily. 05/16/12   Dorena Cookey, MD  levothyroxine (SYNTHROID, LEVOTHROID) 50 MCG tablet Take 1 tablet (50 mcg total) by mouth daily before breakfast. 03/24/13   Dorena Cookey, MD  Loperamide HCl (IMODIUM A-D PO) Take 1 tablet by mouth as needed.    Historical Provider, MD  Meth-Hyo-M Bl-Benz Acd-Ph Sal (HYOPHEN PO) Take 1 tablet by mouth 2 (two)  times daily.    Historical Provider, MD  mometasone (NASONEX) 50 MCG/ACT nasal spray Place 2 sprays into the nose as needed. 05/16/12   Dorena Cookey, MD  mometasone (NASONEX) 50 MCG/ACT nasal spray Place 2 sprays into the nose daily as needed. breathing    Historical Provider, MD  montelukast (SINGULAIR) 10 MG tablet Take 1 tablet (10 mg total) by mouth daily. 05/16/12   Dorena Cookey, MD  montelukast (SINGULAIR) 10 MG tablet Take 10 mg  by mouth as needed.    Historical Provider, MD  Naphazoline-Polyethyl Glycol (EYE DROPS) 0.012-0.2 % SOLN Apply to eye. Artifical eye drops     Historical Provider, MD  nitrofurantoin, macrocrystal-monohydrate, (MACROBID) 100 MG capsule Take 100 mg by mouth 2 (two) times daily.    Historical Provider, MD  nitroGLYCERIN (NITROSTAT) 0.4 MG SL tablet Place 1 tablet (0.4 mg total) under the tongue every 5 (five) minutes as needed. 05/16/12   Dorena Cookey, MD  Omega-3 Fatty Acids (FISH OIL) 1200 MG CAPS Take by mouth.      Historical Provider, MD  omeprazole (PRILOSEC) 20 MG capsule Take 1 capsule (20 mg total) by mouth 2 (two) times daily. 05/16/12   Dorena Cookey, MD  oxyCODONE-acetaminophen (PERCOCET/ROXICET) 5-325 MG per tablet Take 1 tablet by mouth every 4 (four) hours as needed for pain. 10/17/12   Hoy Morn, MD  potassium chloride SA (K-DUR,KLOR-CON) 20 MEQ tablet Take 1 tablet (20 mEq total) by mouth daily. 05/16/12   Dorena Cookey, MD  salicylic acid 6 % gel Apply 1 application topically as needed. For feet    Historical Provider, MD  Salicylic Acid-Cleanser 6 % (CREAM) KIT Apply topically.      Historical Provider, MD    Family History  Family History  Problem Relation Age of Onset  . Heart disease Mother   . Hypertension Mother   . Heart attack Mother 60       MULTIPLES OVER TIME  . Melanoma Daughter 65       METASTATIC  . Breast cancer Sister   . Hypertension Father   . Hypertension Sister     Social History  Social History    Socioeconomic History  . Marital status: Married    Spouse name: Not on file  . Number of children: 2  . Years of education: Not on file  . Highest education level: Not on file  Occupational History  . Occupation: RN-retired    Employer: RETIRED  Social Needs  . Financial resource strain: Not on file  . Food insecurity:    Worry: Not on file    Inability: Not on file  . Transportation needs:    Medical: Not on file    Non-medical: Not on file  Tobacco Use  . Smoking status: Never Smoker  . Smokeless tobacco: Never Used  Substance and Sexual Activity  . Alcohol use: No    Comment: rarely  . Drug use: No  . Sexual activity: Not on file  Lifestyle  . Physical activity:    Days per week: Not on file    Minutes per session: Not on file  . Stress: Not on file  Relationships  . Social connections:    Talks on phone: Not on file    Gets together: Not on file    Attends religious service: Not on file    Active member of club or organization: Not on file    Attends meetings of clubs or organizations: Not on file    Relationship status: Not on file  . Intimate partner violence:    Fear of current or ex partner: Not on file    Emotionally abused: Not on file    Physically abused: Not on file    Forced sexual activity: Not on file  Other Topics Concern  . Not on file  Social History Narrative   ** Merged History Encounter **         Review of Systems General:  No chills, fever, night sweats or weight changes.  Cardiovascular:  No chest pain, dyspnea on exertion,  + lower exetrmities edema, orthopnea, palpitations, paroxysmal nocturnal dyspnea. Dermatological: No rash, lesions/masses Respiratory: No cough, dyspnea Urologic: No hematuria, dysuria Abdominal:   No nausea, vomiting, diarrhea, bright red blood per rectum, melena, or hematemesis Neurologic:  No visual changes, wkns, changes in mental status. All other systems reviewed and are otherwise negative except as  noted above.  Physical Exam  Blood pressure 128/70, heart rate 76 bpm, O2 sats 96% on room air, weight 143 pounds   psych: Normal affect. Neuro: Alert and oriented X 3. Moves all extremities spontaneously. HEENT: Normal  Neck: Supple without bruits or JVD. Lungs:  Resp regular and unlabored, CTA. Heart: RRR no s3, s4, or murmurs. Abdomen: Soft, non-tender, non-distended, BS + x 4.  Extremities: No clubbing, cyanosis or edema. DP/PT/Radials 2+ and equal bilaterally.  Accessory Clinical Findings  ECG -sinus rhythm, prior inferior infarct, age undetermined, unchanged from prior. This was personally reviewed.   Lipid Panel     Component Value Date/Time   CHOL 201 (H) 11/09/2017 0952   TRIG 125.0 11/09/2017 0952   HDL 50.70 11/09/2017 0952   CHOLHDL 4 11/09/2017 0952   VLDL 25.0 11/09/2017 0952   LDLCALC 126 (H) 11/09/2017 0952   TTE 04/28/2013  Left ventricle: The cavity size was normal. Wall thickness was normal. The estimated ejection fraction was 60%. Wall motion was normal; there were no regional wall motion abnormalities.   Left cardiac catheterization: 04/20/2017   Prox RCA to Mid RCA lesion, 0 %stenosed.  Prox RCA lesion, 40 %stenosed.  Ost LAD to Prox LAD lesion, 20 %stenosed.  Prox LAD to Mid LAD lesion, 30 %stenosed.  The left ventricular systolic function is normal.  LV end diastolic pressure is normal.  The left ventricular ejection fraction is greater than 65% by visual estimate.  There is no mitral valve regurgitation.   1. Single vessel CAD with patent stent RCA. The RCA stent has mild to moderate restenosis. This does not appear to be flow limiting.  2. Mild disease LAD 3. Normal LV systolic function  Recommendations: Continue medical management of CAD.     Assessment & Plan  1. CAD, S/P PCI RCA in 2010, now stable, continue ASA 81 mg QD, atorvastatin 80 mg QD, omega 3 acids, no BB (asthma). She underwent cardiac catheterization in  October 2018 with findings of mild nonobstructive coronary artery disease normal LVEF, medical management was recommended, in fact she is very active and doing well.  2. Hyperlipidemia - lipids at goal on atorvastatin 80 mg daily. It's tolerated well he will continue.  3. Hypertension - controlled   4. Lower extremity edema - chronic diastolic CHF -well controlled by low-dose of Lasix.  5.  Preoperative evaluation for shoulder surgery. Preop evaluation for shoulder surgery - no contraindication, advised to hold ASA 1 week prior to surgery.  Follow up in 6 months.   Ena Dawley, MD 08/21/2018, 4:52 PM

## 2018-08-22 ENCOUNTER — Encounter: Payer: Medicare Other | Admitting: Physical Therapy

## 2018-08-23 ENCOUNTER — Ambulatory Visit: Payer: Medicare Other | Admitting: Cardiology

## 2018-08-26 ENCOUNTER — Encounter: Payer: Self-pay | Admitting: Cardiology

## 2018-08-27 NOTE — Progress Notes (Signed)
Cardiology Office Note    Date:  08/28/2018   ID:  [name], [name] [birth year], MRN [id number]  PCP:  [name], NP  Cardiologist: Ena Dawley, MD EPS: None  Chief Complaint  Patient presents with  . Follow-up    History of Present Illness:  Rebecca [name] is a 73 y.o. female , retired Marine scientist with history of CAD status post PCI to the RCA in 2010, cardiac catheterization 04/2017 mild nonobstructive CAD with normal LVEF medical management recommended.  Also has hyperlipidemia, HTN, chronic diastolic CHF with lower extremity edema controlled with low-dose Lasix.  Last saw Dr. [name] 01/2018 at which time she was cleared for shoulder surgery.  Patient did well initially after 5 months developed severe shoulder pain again that was coming from [facility] and has had PT for almost a year. Was on a cruise in January. Had abdominal pain around her back and took a NTG even though it wasn't like her prior angina. BP dropped and she passed out walking back to her room. She was vomiting non stop. She was given IV fluids. Blood work all normal including cardiac enzymes.She brings in an EKG that is unchanged from 2018. One week after the cruise she had another GI virus that took a week to recover. Pulse usually 78-82. HR has been running in 80's-90's which is unusual to her.She feels jittery inside. Just doesn't feel right.  Doing yoga. No chest tightness or shortness of breath, dizziness or presyncope.     Past Medical History:  Diagnosis Date  . Asthma    extrinsic  . CAD (coronary artery disease)   . Chest pain, atypical   . Diabetes ([place])   . Edema   . Fatigue   . Fatigue   . GERD (gastroesophageal reflux disease)   . Hyperlipidemia   . Hypertension    familiar  . Hypothyroidism   . IBS (irritable bowel syndrome)    hx  . Keratosis, seborrheic   . Labyrinthitis   . MI (myocardial infarction) ([place])    age 35  . Osteoarthritis   . S/P arthroscopy of right shoulder 03/01/2018   . Skin cancer    Melanoma     Past Surgical History:  Procedure Laterality Date  . Breast Biopsy     dense breast tissue  . BREAST EXCISIONAL BIOPSY Left 2000   benign  . CHOLECYSTECTOMY    . EUS  04/04/2012   Procedure: UPPER ENDOSCOPIC ULTRASOUND (EUS) LINEAR;  Surgeon: Milus Banister, MD;  Location: [facility];  Service: Endoscopy;  Laterality: N/A;  radial linear  . KNEE ARTHROPLASTY     total right  02-28-2010 Dr Pilar Plate Aluisio, left  . KNEE ARTHROPLASTY Bilateral 09/12/2017   [facility] Shade  . LEFT HEART CATH AND CORONARY ANGIOGRAPHY N/A 04/20/2017   Procedure: LEFT HEART CATH AND CORONARY ANGIOGRAPHY;  Surgeon: Burnell Blanks, MD;  Location: [facility];  Service: Cardiovascular;  Laterality: N/A;  . TUBAL LIGATION      Current Medications: Current Meds  Medication Sig  . ACCU-CHEK FASTCLIX LANCETS MISC USE   TO CHECK GLUCOSE TWICE DAILY  . ACCU-CHEK GUIDE test strip Use to test blood glucose twice daily  . acetaminophen (TYLENOL) 325 MG tablet Take 650 mg by mouth every 6 (six) hours as needed.    Marland Kitchen albuterol (PROVENTIL HFA;VENTOLIN HFA) 108 (90 Base) MCG/ACT inhaler Inhale 2 puffs into the lungs every 6 (six) hours as needed for wheezing or shortness of breath.  Marland Kitchen  aspirin EC 81 MG tablet Take 1 tablet (81 mg total) by mouth daily.  Marland Kitchen atorvastatin (LIPITOR) 80 MG tablet Take 1 tablet (80 mg total) by mouth daily.  . benzonatate (TESSALON) 200 MG capsule Take 1 capsule (200 mg total) by mouth 3 (three) times daily as needed for cough.  . calcium carbonate (OS-CAL) 600 MG TABS Take 1,200 mg by mouth daily.   . Cholecalciferol (VITAMIN D) 2000 UNITS CAPS Take 1 capsule by mouth daily.    Marland Kitchen conjugated estrogens (PREMARIN) vaginal cream Place 0.5 Applicatorfuls vaginally every 7 (seven) days.  Marland Kitchen denosumab (PROLIA) 60 MG/ML SOSY injection Inject 60 mg into the skin every 6 (six) months.  . diltiazem (CARDIZEM CD) 180 MG 24 hr capsule Take 1 capsule (180 mg total)  by mouth daily.  . fluticasone (FLOVENT HFA) 44 MCG/ACT inhaler Inhale 2 puffs into the lungs 2 (two) times daily.  . furosemide (LASIX) 20 MG tablet Take 1 tablet (20 mg total) by mouth 2 (two) times daily.  Marland Kitchen HYDROcodone-homatropine (HYCODAN) 5-1.5 MG/5ML syrup Take 5 mLs by mouth every 8 (eight) hours as needed for cough.  . INVOKANA 100 MG TABS tablet TAKE 1 TABLET BY MOUTH  DAILY BEFORE BREAKFAST  . levothyroxine (SYNTHROID, LEVOTHROID) 50 MCG tablet TAKE 1 TABLET BY MOUTH  DAILY BEFORE BREAKFAST  . Naphazoline-Polyethyl Glycol (EYE DROPS) 0.012-0.2 % SOLN Apply to eye as needed. Artifical eye drops  . nitroGLYCERIN (NITROSTAT) 0.4 MG SL tablet DISSOLVE 1 TABLET UNDER THE TONGUE EVERY 5 MINUTES AS  NEEDED FOR CHEST PAIN. MAX  3 DOSES IN 15 MINUTES. CALL 911 IF CHEST PAIN PERSISTS  . Omega-3 Fatty Acids (FISH OIL) 1200 MG CAPS Take by mouth.    Marland Kitchen OVER THE COUNTER MEDICATION CBD OIL - SHOULDER PAIN  . potassium chloride SA (K-DUR,KLOR-CON) 20 MEQ tablet Take 1 tablet (20 mEq total) by mouth 2 (two) times daily.  . traZODone (DESYREL) 50 MG tablet TAKE 1/2 TO 1 TABLET BY  MOUTH AT BEDTIME AS NEEDED  FOR SLEEP     Allergies:   Sulfa antibiotics and Sulfonamide derivatives   Social History   Socioeconomic History  . Marital status: Married    Spouse name: Not on file  . Number of children: 2  . Years of education: Not on file  . Highest education level: Not on file  Occupational History  . Occupation: RN-retired    Employer: RETIRED  Social Needs  . Financial resource strain: Not on file  . Food insecurity:    Worry: Not on file    Inability: Not on file  . Transportation needs:    Medical: Not on file    Non-medical: Not on file  Tobacco Use  . Smoking status: Never Smoker  . Smokeless tobacco: Never Used  Substance and Sexual Activity  . Alcohol use: No    Comment: rarely  . Drug use: No  . Sexual activity: Not on file  Lifestyle  . Physical activity:    Days per week:  Not on file    Minutes per session: Not on file  . Stress: Not on file  Relationships  . Social connections:    Talks on phone: Not on file    Gets together: Not on file    Attends religious service: Not on file    Active member of club or organization: Not on file    Attends meetings of clubs or organizations: Not on file    Relationship status: Not on file  Other Topics Concern  . Not on file  Social History Narrative   ** Merged History Encounter **         Family History:  The patient's family history includes Breast cancer in her sister; Heart attack (age of onset: 57) in her mother; Heart disease in her mother; Hypertension in her father, mother, and sister; Melanoma (age of onset: 9) in her daughter.   ROS:   Please see the history of present illness.    ROS All other systems reviewed and are negative.   PHYSICAL EXAM:   VS:  BP 130/84 (BP Location: Left Arm, Patient Position: Sitting, Cuff Size: Normal)   Pulse 91   Ht 4\' 11"  (1.499 m)   Wt 137 lb (62.1 kg)   SpO2 98% Comment: at rest  BMI 27.67 kg/m   Physical Exam  GEN: Well nourished, well developed, in no acute distress  HEENT: normal  Neck: no JVD, carotid bruits, or masses Cardiac:RRR; no murmurs, rubs, or gallops  Respiratory:  clear to auscultation bilaterally, normal work of breathing GI: soft, nontender, nondistended, + BS Ext: without cyanosis, clubbing, or edema, Good distal pulses bilaterally MS: no deformity or atrophy  Skin: warm and dry, no rash Neuro:  Alert and Oriented x 3, Strength and sensation are intact Psych: euthymic mood, full affect  Wt Readings from Last 3 Encounters:  08/28/18 137 lb (62.1 kg)  03/21/18 141 lb (64 kg)  02/07/18 142 lb (64.4 kg)      Studies/Labs Reviewed:   EKG:  EKG is not ordered today.  The ekg she brought in from the cruise ship shows normal sinus rhythm with T wave inversion in lead III actually improved from last EKG here in 2018 Recent  Labs: 11/09/2017: ALT 16; BUN 13; Creatinine, Ser 0.77; Hemoglobin 14.8; Platelets 291.0; Potassium 3.7; Sodium 139; TSH 1.24   Lipid Panel    Component Value Date/Time   CHOL 201 (H) 11/09/2017 0952   TRIG 125.0 11/09/2017 0952   HDL 50.70 11/09/2017 0952   CHOLHDL 4 11/09/2017 0952   VLDL 25.0 11/09/2017 0952   LDLCALC 126 (H) 11/09/2017 7124    Additional studies/ records that were reviewed today include:   Cardiac catheterization 10/5/2018Prox RCA to Mid RCA lesion, 0 %stenosed.  Prox RCA lesion, 40 %stenosed.  Ost LAD to Prox LAD lesion, 20 %stenosed.  Prox LAD to Mid LAD lesion, 30 %stenosed.  The left ventricular systolic function is normal.  LV end diastolic pressure is normal.  The left ventricular ejection fraction is greater than 65% by visual estimate.  There is no mitral valve regurgitation.   1. Single vessel CAD with patent stent RCA. The RCA stent has mild to moderate restenosis. This does not appear to be flow limiting.  2. Mild disease LAD 3. Normal LV systolic function   Recommendations: Continue medical management of CAD.    Echo 2017Study Conclusions   - Left ventricle: The cavity size was normal. Systolic function was   normal. The estimated ejection fraction was in the range of 60%   to 65%. Wall motion was normal; there were no regional wall   motion abnormalities. Left ventricular diastolic function   parameters were normal. - Mitral valve: There was trivial regurgitation. - Tricuspid valve: There was trivial regurgitation. - Pulmonic valve: There was trivial regurgitation.       ASSESSMENT:    1. Atherosclerosis of native coronary artery of native heart without angina pectoris   2. Mixed hyperlipidemia  3. Essential hypertension   4. Palpitations      PLAN:  In order of problems listed above:  Palpitations with history of PVCs that do not bother her but most recently with an increased heart rate in the 90s and jittery feeling  inside.  Will order 2-week monitor, check labs including electrolytes CBC and TSH, 2D echo.  Follow-up with me in 1 month Dr. [name] in June  CAD status post PCI of the RCA in 2010, cath 04/2017 mild nonobstructive CAD with normal LVEF on aspirin and statin and diltiazem, no beta-blocker because of history of asthma  Hyperlipidemia on atorvastatin LDL 126 last April.  Goal is less than 70.  When she has her fasting lipid panels in April may need referred to lipid clinic  Essential hypertension blood pressure well controlled  Chronic diastolic CHF compensated    Medication Adjustments/Labs and Tests Ordered: Current medicines are reviewed at length with the patient today.  Concerns regarding medicines are outlined above.  Medication changes, Labs and Tests ordered today are listed in the Patient Instructions below. Patient Instructions  Medication Instructions:  Your physician recommends that you continue on your current medications as directed. Please refer to the Current Medication list given to you today.  If you need a refill on your cardiac medications before your next appointment, please call your pharmacy.   Lab work: TODAY: CBC, CMET, TSH  If you have labs (blood work) drawn today and your tests are completely normal, you will receive your results only by: Marland Kitchen MyChart Message (if you have MyChart) OR . A paper copy in the mail If you have any lab test that is abnormal or we need to change your treatment, we will call you to review the results.  Testing/Procedures: Your physician has requested that you have an echocardiogram. Echocardiography is a painless test that uses sound waves to create images of your heart. It provides your doctor with information about the size and shape of your heart and how well your heart's chambers and valves are working. This procedure takes approximately one hour. There are no restrictions for this procedure.  Your physician has recommended that you  wear a 14 day long term monitor. These monitors are medical devices that record the heart's electrical activity. Doctors most often use these monitors to diagnose arrhythmias. Arrhythmias are problems with the speed or rhythm of the heartbeat. The monitor is a small, portable device. You can wear one while you do your normal daily activities. This is usually used to diagnose what is causing palpitations/syncope (passing out).   Follow-Up: Your physician recommends that you schedule a follow-up appointment in: 1 month with Ermalinda Barrios, PA  Your physician recommends that you schedule a follow-up appointment next available with Dr. [name]   Any Other Special Instructions Will Be Listed Below (If Applicable).       Sumner Boast, PA-C  08/28/2018 11:19 AM    [facility] [place], [place] [place]  [address] Phone: [phone]; Fax: [phone]

## 2018-08-28 ENCOUNTER — Ambulatory Visit (INDEPENDENT_AMBULATORY_CARE_PROVIDER_SITE_OTHER): Payer: Medicare Other | Admitting: Physician Assistant

## 2018-08-28 ENCOUNTER — Encounter: Payer: Self-pay | Admitting: Physician Assistant

## 2018-08-28 VITALS — BP 130/84 | HR 91 | Ht 59.0 in | Wt 137.0 lb

## 2018-08-28 DIAGNOSIS — E782 Mixed hyperlipidemia: Secondary | ICD-10-CM

## 2018-08-28 DIAGNOSIS — R002 Palpitations: Secondary | ICD-10-CM | POA: Diagnosis not present

## 2018-08-28 DIAGNOSIS — I2510 Atherosclerotic heart disease of native coronary artery without angina pectoris: Secondary | ICD-10-CM

## 2018-08-28 DIAGNOSIS — I10 Essential (primary) hypertension: Secondary | ICD-10-CM

## 2018-08-28 LAB — COMPREHENSIVE METABOLIC PANEL
ALK PHOS: 85 IU/L (ref 39–117)
ALT: 53 IU/L — ABNORMAL HIGH (ref 0–32)
AST: 18 IU/L (ref 0–40)
Albumin/Globulin Ratio: 2.3 — ABNORMAL HIGH (ref 1.2–2.2)
Albumin: 4.6 g/dL (ref 3.7–4.7)
BUN/Creatinine Ratio: 27 (ref 12–28)
BUN: 23 mg/dL (ref 8–27)
Bilirubin Total: 0.5 mg/dL (ref 0.0–1.2)
CO2: 23 mmol/L (ref 20–29)
CREATININE: 0.86 mg/dL (ref 0.57–1.00)
Calcium: 9.9 mg/dL (ref 8.7–10.3)
Chloride: 99 mmol/L (ref 96–106)
GFR calc Af Amer: 77 mL/min/{1.73_m2} (ref >59)
GFR calc non Af Amer: 67 mL/min/{1.73_m2} (ref >59)
Globulin, Total: 2 g/dL (ref 1.5–4.5)
Glucose: 155 mg/dL — ABNORMAL HIGH (ref 65–99)
Potassium: 4.3 mmol/L (ref 3.5–5.2)
Sodium: 139 mmol/L (ref 134–144)
Total Protein: 6.6 g/dL (ref 6.0–8.5)

## 2018-08-28 LAB — TSH: TSH: 0.993 u[IU]/mL (ref 0.450–4.500)

## 2018-08-28 LAB — CBC
Hematocrit: 44.7 % (ref 34.0–46.6)
Hemoglobin: 15.1 g/dL (ref 11.1–15.9)
MCH: 30.8 pg (ref 26.6–33.0)
MCHC: 33.8 g/dL (ref 31.5–35.7)
MCV: 91 fL (ref 79–97)
Platelets: 340 10*3/uL (ref 150–450)
RBC: 4.9 x10E6/uL (ref 3.77–5.28)
RDW: 12.4 % (ref 11.7–15.4)
WBC: 9.8 10*3/uL (ref 3.4–10.8)

## 2018-08-28 NOTE — Patient Instructions (Signed)
Medication Instructions:  Your physician recommends that you continue on your current medications as directed. Please refer to the Current Medication list given to you today.  If you need a refill on your cardiac medications before your next appointment, please call your pharmacy.   Lab work: TODAY: CBC, CMET, TSH  If you have labs (blood work) drawn today and your tests are completely normal, you will receive your results only by: Marland Kitchen MyChart Message (if you have MyChart) OR . A paper copy in the mail If you have any lab test that is abnormal or we need to change your treatment, we will call you to review the results.  Testing/Procedures: Your physician has requested that you have an echocardiogram. Echocardiography is a painless test that uses sound waves to create images of your heart. It provides your doctor with information about the size and shape of your heart and how well your heart's chambers and valves are working. This procedure takes approximately one hour. There are no restrictions for this procedure.  Your physician has recommended that you wear a 14 day long term monitor. These monitors are medical devices that record the heart's electrical activity. Doctors most often use these monitors to diagnose arrhythmias. Arrhythmias are problems with the speed or rhythm of the heartbeat. The monitor is a small, portable device. You can wear one while you do your normal daily activities. This is usually used to diagnose what is causing palpitations/syncope (passing out).   Follow-Up: Your physician recommends that you schedule a follow-up appointment in: 1 month with Ermalinda Barrios, PA  Your physician recommends that you schedule a follow-up appointment next available with Dr. [name]   Any Other Special Instructions Will Be Listed Below (If Applicable).

## 2018-09-04 ENCOUNTER — Ambulatory Visit (INDEPENDENT_AMBULATORY_CARE_PROVIDER_SITE_OTHER): Payer: Medicare Other

## 2018-09-04 ENCOUNTER — Ambulatory Visit (HOSPITAL_COMMUNITY): Payer: Medicare Other | Attending: Cardiology

## 2018-09-04 DIAGNOSIS — I2510 Atherosclerotic heart disease of native coronary artery without angina pectoris: Secondary | ICD-10-CM | POA: Insufficient documentation

## 2018-09-04 DIAGNOSIS — R002 Palpitations: Secondary | ICD-10-CM

## 2018-09-20 DIAGNOSIS — Z9889 Other specified postprocedural states: Secondary | ICD-10-CM | POA: Diagnosis not present

## 2018-09-20 DIAGNOSIS — M25511 Pain in right shoulder: Secondary | ICD-10-CM | POA: Diagnosis not present

## 2018-09-24 DIAGNOSIS — R002 Palpitations: Secondary | ICD-10-CM | POA: Diagnosis not present

## 2018-10-01 ENCOUNTER — Ambulatory Visit: Payer: Medicare Other | Admitting: Physician Assistant

## 2018-10-08 ENCOUNTER — Ambulatory Visit: Payer: Medicare Other | Admitting: Physician Assistant

## 2018-10-12 ENCOUNTER — Other Ambulatory Visit: Payer: Self-pay | Admitting: Adult Health

## 2018-10-12 DIAGNOSIS — E119 Type 2 diabetes mellitus without complications: Secondary | ICD-10-CM

## 2018-10-14 NOTE — Telephone Encounter (Signed)
Filled for 1 year 01/2018.  Message sent to the pharmacy to check file.

## 2018-11-03 ENCOUNTER — Other Ambulatory Visit: Payer: Self-pay

## 2018-11-03 ENCOUNTER — Emergency Department (HOSPITAL_COMMUNITY)
Admission: EM | Admit: 2018-11-03 | Discharge: 2018-11-03 | Disposition: A | Discharge status: Home / Self Care | Payer: Medicare Other | Attending: Emergency Medicine | Admitting: Emergency Medicine

## 2018-11-03 ENCOUNTER — Encounter (HOSPITAL_COMMUNITY): Payer: Self-pay

## 2018-11-03 ENCOUNTER — Emergency Department (HOSPITAL_COMMUNITY): Payer: Medicare Other

## 2018-11-03 DIAGNOSIS — M6281 Muscle weakness (generalized): Secondary | ICD-10-CM | POA: Insufficient documentation

## 2018-11-03 DIAGNOSIS — E039 Hypothyroidism, unspecified: Secondary | ICD-10-CM | POA: Diagnosis not present

## 2018-11-03 DIAGNOSIS — Z79899 Other long term (current) drug therapy: Secondary | ICD-10-CM | POA: Diagnosis not present

## 2018-11-03 DIAGNOSIS — R531 Weakness: Secondary | ICD-10-CM | POA: Diagnosis not present

## 2018-11-03 DIAGNOSIS — Z7984 Long term (current) use of oral hypoglycemic drugs: Secondary | ICD-10-CM | POA: Insufficient documentation

## 2018-11-03 DIAGNOSIS — J45909 Unspecified asthma, uncomplicated: Secondary | ICD-10-CM | POA: Diagnosis not present

## 2018-11-03 DIAGNOSIS — Z7982 Long term (current) use of aspirin: Secondary | ICD-10-CM | POA: Insufficient documentation

## 2018-11-03 DIAGNOSIS — Z20828 Contact with and (suspected) exposure to other viral communicable diseases: Secondary | ICD-10-CM | POA: Insufficient documentation

## 2018-11-03 DIAGNOSIS — R509 Fever, unspecified: Secondary | ICD-10-CM | POA: Diagnosis not present

## 2018-11-03 DIAGNOSIS — E119 Type 2 diabetes mellitus without complications: Secondary | ICD-10-CM | POA: Diagnosis not present

## 2018-11-03 DIAGNOSIS — I5032 Chronic diastolic (congestive) heart failure: Secondary | ICD-10-CM | POA: Diagnosis not present

## 2018-11-03 DIAGNOSIS — I110 Hypertensive heart disease with heart failure: Secondary | ICD-10-CM | POA: Diagnosis not present

## 2018-11-03 DIAGNOSIS — I252 Old myocardial infarction: Secondary | ICD-10-CM | POA: Insufficient documentation

## 2018-11-03 DIAGNOSIS — I2510 Atherosclerotic heart disease of native coronary artery without angina pectoris: Secondary | ICD-10-CM | POA: Insufficient documentation

## 2018-11-03 DIAGNOSIS — R5383 Other fatigue: Secondary | ICD-10-CM | POA: Diagnosis not present

## 2018-11-03 LAB — CBC WITH DIFFERENTIAL/PLATELET
Abs Immature Granulocytes: 0.03 10*3/uL (ref 0.00–0.07)
Basophils Absolute: 0 10*3/uL (ref 0.0–0.1)
Basophils Relative: 0 %
Eosinophils Absolute: 0.1 10*3/uL (ref 0.0–0.5)
Eosinophils Relative: 2 %
HCT: 44.6 % (ref 36.0–46.0)
Hemoglobin: 15.3 g/dL — ABNORMAL HIGH (ref 12.0–15.0)
Immature Granulocytes: 1 %
Lymphocytes Relative: 20 %
Lymphs Abs: 0.9 10*3/uL (ref 0.7–4.0)
MCH: 31.2 pg (ref 26.0–34.0)
MCHC: 34.3 g/dL (ref 30.0–36.0)
MCV: 91 fL (ref 80.0–100.0)
Monocytes Absolute: 0.5 10*3/uL (ref 0.1–1.0)
Monocytes Relative: 10 %
Neutro Abs: 3 10*3/uL (ref 1.7–7.7)
Neutrophils Relative %: 67 %
Platelets: 168 10*3/uL (ref 150–400)
RBC: 4.9 MIL/uL (ref 3.87–5.11)
RDW: 12.2 % (ref 11.5–15.5)
WBC: 4.5 10*3/uL (ref 4.0–10.5)
nRBC: 0 % (ref 0.0–0.2)

## 2018-11-03 LAB — COMPREHENSIVE METABOLIC PANEL
ALT: 60 U/L — ABNORMAL HIGH (ref 0–44)
AST: 39 U/L (ref 15–41)
Albumin: 3.9 g/dL (ref 3.5–5.0)
Alkaline Phosphatase: 78 U/L (ref 38–126)
Anion gap: 17 — ABNORMAL HIGH (ref 5–15)
BUN: 16 mg/dL (ref 8–23)
CO2: 19 mmol/L — ABNORMAL LOW (ref 22–32)
Calcium: 10.1 mg/dL (ref 8.9–10.3)
Chloride: 93 mmol/L — ABNORMAL LOW (ref 98–111)
Creatinine, Ser: 0.98 mg/dL (ref 0.44–1.00)
GFR calc Af Amer: >60 mL/min (ref >60)
GFR calc non Af Amer: 57 mL/min — ABNORMAL LOW (ref >60)
Glucose, Bld: 113 mg/dL — ABNORMAL HIGH (ref 70–99)
Potassium: 3.8 mmol/L (ref 3.5–5.1)
Sodium: 129 mmol/L — ABNORMAL LOW (ref 135–145)
Total Bilirubin: 0.8 mg/dL (ref 0.3–1.2)
Total Protein: 6.8 g/dL (ref 6.5–8.1)

## 2018-11-03 LAB — URINALYSIS, ROUTINE W REFLEX MICROSCOPIC
Bacteria, UA: NONE SEEN
Bilirubin Urine: NEGATIVE
Glucose, UA: >=500 mg/dL — AB
Hgb urine dipstick: NEGATIVE
Ketones, ur: 80 mg/dL — AB
Leukocytes,Ua: NEGATIVE
Nitrite: NEGATIVE
Protein, ur: NEGATIVE mg/dL
Specific Gravity, Urine: 1.008 (ref 1.005–1.030)
pH: 5 (ref 5.0–8.0)

## 2018-11-03 LAB — SARS CORONAVIRUS 2 BY RT PCR (HOSPITAL ORDER, PERFORMED IN ~~LOC~~ HOSPITAL LAB): SARS Coronavirus 2: NEGATIVE

## 2018-11-03 MED ORDER — SODIUM CHLORIDE 0.9 % IV BOLUS
1000.0000 mL | Freq: Once | INTRAVENOUS | Status: AC
  Administered 2018-11-03: 16:00:00 1000 mL via INTRAVENOUS

## 2018-11-03 NOTE — ED Notes (Signed)
ED Provider at bedside. 

## 2018-11-03 NOTE — ED Notes (Signed)
Patient verbalizes understanding of discharge instructions. Opportunity for questioning and answers were provided. Armband removed by staff, pt discharged from ED.  

## 2018-11-03 NOTE — ED Triage Notes (Signed)
Pt arrives POV from home c/o generalized weakness, body aches, chills and oral temp of 102 two days ago. Pt states she has been home since March 1st and has not been around anyone who is sick. Pt has a "little cough" at night stating it is from post-nasal drip. Pt has hx HTN, MI in 1992, DM2. Pt a&o x4.

## 2018-11-03 NOTE — ED Provider Notes (Signed)
[facility] EMERGENCY DEPARTMENT Provider Note   CSN: [id number] Arrival date & time: 11/03/18  1336    History   Chief Complaint Chief Complaint  Patient presents with  . Weakness  . Generalized Body Aches    HPI Rebecca [name] is a 73 y.o. female.     The history is provided by the patient and medical records. No language interpreter was used.  Weakness  Associated symptoms: cough, fever and myalgias   Associated symptoms: no chest pain and no shortness of breath    Rebecca [name] is a 73 y.o. female  with a PMH as listed below who presents to the Emergency Department complaining of generalized weakness and fatigue for the last 3 days.  At onset of symptoms 3 days ago, she had an oral temperature of 102.  She has been taking Tylenol.  Does report a cough, mostly at night which she attributes to postnasal drip.  She checked her temperature today and it was 99.  She came to the emergency department today because her myalgias and malaise was much worse today than in the past.  She denies any abdominal pain, diarrhea, nausea or vomiting.  No chest pain or shortness of breath.  No back pain.  No dysuria or urgency, but has been going to the restroom a little more frequently.  She has not had any sick contacts.  She reports that other than walking around her neighborhood with her husband in the afternoons, she has not left her house in a few weeks.  Past Medical History:  Diagnosis Date  . Asthma    extrinsic  . CAD (coronary artery disease)   . Chest pain, atypical   . Diabetes ([place])   . Edema   . Fatigue   . Fatigue   . GERD (gastroesophageal reflux disease)   . Hyperlipidemia   . Hypertension    familiar  . Hypothyroidism   . IBS (irritable bowel syndrome)    hx  . Keratosis, seborrheic   . Labyrinthitis   . MI (myocardial infarction) ([place])    age 26  . Osteoarthritis   . S/P arthroscopy of right shoulder 03/01/2018  . Skin cancer    Melanoma     Patient Active Problem List   Diagnosis Date Noted  . Palpitations 08/28/2018  . Osteoporosis 12/25/2017  . Coronary artery disease   . Dysuria 02/17/2017  . Diarrhea 02/17/2017  . Vitamin D deficiency 11/07/2016  . Chronic diastolic CHF (congestive heart failure) ([place]) 11/18/2015  . Diabetes mellitus without complication ([place]) 10/93/2355  . Reactive airway disease with acute exacerbation 09/10/2014  . Nonspecific (abnormal) findings on radiological and other examination of gastrointestinal tract 04/04/2012  . Insomnia, idiopathic 10/04/2010  . Hyperlipidemia 09/22/2009  . Essential hypertension 09/22/2009  . CAD, NATIVE VESSEL 09/22/2009  . EDEMA 05/19/2009  . Hypothyroidism 02/23/2009  . EXTRINSIC ASTHMA, UNSPECIFIED 02/10/2009  . KERATOSIS, SEBORRHEIC [place] 04/16/2007  . SKIN CANCER, HX OF 02/14/2007  . Depression 01/24/2007  . MYOCARDIAL INFARCTION, HX OF 01/24/2007  . GERD 01/24/2007  . Osteoarthritis 01/24/2007  . COLONIC POLYPS, HX OF 01/24/2007  . IRRITABLE BOWEL SYNDROME, HX OF 01/24/2007    Past Surgical History:  Procedure Laterality Date  . Breast Biopsy     dense breast tissue  . BREAST EXCISIONAL BIOPSY Left 2000   benign  . CHOLECYSTECTOMY    . EUS  04/04/2012   Procedure: UPPER ENDOSCOPIC ULTRASOUND (EUS) LINEAR;  Surgeon: Milus Banister, MD;  Location: [facility];  Service: Endoscopy;  Laterality: N/A;  radial linear  . KNEE ARTHROPLASTY     total right  02-28-2010 Dr Pilar Plate Aluisio, left  . KNEE ARTHROPLASTY Bilateral 09/12/2017   Hector Shade  . LEFT HEART CATH AND CORONARY ANGIOGRAPHY N/A 04/20/2017   Procedure: LEFT HEART CATH AND CORONARY ANGIOGRAPHY;  Surgeon: Burnell Blanks, MD;  Location: [facility];  Service: Cardiovascular;  Laterality: N/A;  . TUBAL LIGATION       OB History   No obstetric history on file.      Home Medications    Prior to Admission medications   Medication Sig Start Date End Date Taking? Authorizing  Provider  ACCU-CHEK FASTCLIX LANCETS MISC USE   TO CHECK GLUCOSE TWICE DAILY 07/11/18   [name], NP  ACCU-CHEK GUIDE test strip Use to test blood glucose twice daily 02/12/18   Nafziger, Tommi Rumps, NP  acetaminophen (TYLENOL) 325 MG tablet Take 650 mg by mouth every 6 (six) hours as needed.      [provider]  albuterol (PROVENTIL HFA;VENTOLIN HFA) 108 (90 Base) MCG/ACT inhaler Inhale 2 puffs into the lungs every 6 (six) hours as needed for wheezing or shortness of breath. 03/07/18   Billie Ruddy, MD  aspirin EC 81 MG tablet Take 1 tablet (81 mg total) by mouth daily. 04/18/17   Dorothy Spark, MD  atorvastatin (LIPITOR) 80 MG tablet Take 1 tablet (80 mg total) by mouth daily. 11/16/17   Nafziger, Tommi Rumps, NP  benzonatate (TESSALON) 200 MG capsule Take 1 capsule (200 mg total) by mouth 3 (three) times daily as needed for cough. 03/21/18   Nafziger, Tommi Rumps, NP  calcium carbonate (OS-CAL) 600 MG TABS Take 1,200 mg by mouth daily.     [provider]  Cholecalciferol (VITAMIN D) 2000 UNITS CAPS Take 1 capsule by mouth daily.      [provider]  conjugated estrogens (PREMARIN) vaginal cream Place 0.5 Applicatorfuls vaginally every 7 (seven) days. 11/16/17   Nafziger, Tommi Rumps, NP  denosumab (PROLIA) 60 MG/ML SOSY injection Inject 60 mg into the skin every 6 (six) months.    [provider]  diltiazem (CARDIZEM CD) 180 MG 24 hr capsule Take 1 capsule (180 mg total) by mouth daily. 11/16/17   Nafziger, Tommi Rumps, NP  fluticasone (FLOVENT HFA) 44 MCG/ACT inhaler Inhale 2 puffs into the lungs 2 (two) times daily. 03/14/18   Lucretia Kern, DO  furosemide (LASIX) 20 MG tablet Take 1 tablet (20 mg total) by mouth 2 (two) times daily. 11/16/17   Nafziger, Tommi Rumps, NP  HYDROcodone-homatropine (HYCODAN) 5-1.5 MG/5ML syrup Take 5 mLs by mouth every 8 (eight) hours as needed for cough. 03/21/18   Nafziger, Tommi Rumps, NP  INVOKANA 100 MG TABS tablet TAKE 1 TABLET BY MOUTH  DAILY BEFORE BREAKFAST 07/09/18    Nafziger, Tommi Rumps, NP  levothyroxine (SYNTHROID, LEVOTHROID) 50 MCG tablet TAKE 1 TABLET BY MOUTH  DAILY BEFORE BREAKFAST 11/16/17   Nafziger, Tommi Rumps, NP  Naphazoline-Polyethyl Glycol (EYE DROPS) 0.012-0.2 % SOLN Apply to eye as needed. Artifical eye drops    [provider]  nitroGLYCERIN (NITROSTAT) 0.4 MG SL tablet DISSOLVE 1 TABLET UNDER THE TONGUE EVERY 5 MINUTES AS  NEEDED FOR CHEST PAIN. MAX  3 DOSES IN 15 MINUTES. CALL 911 IF CHEST PAIN PERSISTS 07/24/17   Dorothy Spark, MD  Omega-3 Fatty Acids (FISH OIL) 1200 MG CAPS Take by mouth.      [provider]  OVER THE  COUNTER MEDICATION CBD OIL - SHOULDER PAIN    [provider]  potassium chloride SA (K-DUR,KLOR-CON) 20 MEQ tablet Take 1 tablet (20 mEq total) by mouth 2 (two) times daily. 11/16/17   Nafziger, Tommi Rumps, NP  traZODone (DESYREL) 50 MG tablet TAKE 1/2 TO 1 TABLET BY  MOUTH AT BEDTIME AS NEEDED  FOR SLEEP 07/09/18   [name], NP    Family History Family History  Problem Relation Age of Onset  . Heart disease Mother   . Hypertension Mother   . Heart attack Mother 68       MULTIPLES OVER TIME  . Melanoma Daughter 40       METASTATIC  . Breast cancer Sister   . Hypertension Father   . Hypertension Sister     Social History Social History   Tobacco Use  . Smoking status: Never Smoker  . Smokeless tobacco: Never Used  Substance Use Topics  . Alcohol use: No    Comment: rarely  . Drug use: No     Allergies   Sulfa antibiotics and Sulfonamide derivatives   Review of Systems Review of Systems  Constitutional: Positive for chills, fatigue and fever.  Respiratory: Positive for cough. Negative for shortness of breath and wheezing.   Cardiovascular: Negative for chest pain.  Musculoskeletal: Positive for myalgias.  Neurological: Positive for weakness.  All other systems reviewed and are negative.    Physical Exam Updated Vital Signs BP (!) 133/56   Pulse 70   Temp 98.5 F (36.9 C)  (Oral)   Resp 16   Ht 4\' 10"  (1.473 m)   Wt 63.5 kg   SpO2 98%   BMI 29.26 kg/m   Physical Exam Vitals signs and nursing note reviewed.  Constitutional:      General: She is not in acute distress.    Appearance: She is well-developed.     Comments: Nontoxic-appearing.  HENT:     Head: Normocephalic and atraumatic.  Neck:     Musculoskeletal: Neck supple.  Cardiovascular:     Rate and Rhythm: Normal rate and regular rhythm.     Heart sounds: Normal heart sounds. No murmur.  Pulmonary:     Effort: Pulmonary effort is normal. No respiratory distress.     Breath sounds: Normal breath sounds.     Comments: Lungs clear to auscultation bilaterally. Abdominal:     General: There is no distension.     Palpations: Abdomen is soft.     Comments: No abdominal tenderness.  Skin:    General: Skin is warm and dry.  Neurological:     Mental Status: She is alert and oriented to person, place, and time.      ED Treatments / Results  Labs (all labs ordered are listed, but only abnormal results are displayed) Labs Reviewed  CBC WITH DIFFERENTIAL/PLATELET - Abnormal; Notable for the following components:      Result Value   Hemoglobin 15.3 (*)    All other components within normal limits  COMPREHENSIVE METABOLIC PANEL - Abnormal; Notable for the following components:   Sodium 129 (*)    Chloride 93 (*)    CO2 19 (*)    Glucose, Bld 113 (*)    ALT 60 (*)    GFR calc non Af Amer 57 (*)    Anion gap 17 (*)    All other components within normal limits  URINALYSIS, ROUTINE W REFLEX MICROSCOPIC - Abnormal; Notable for the following components:   Color, Urine STRAW (*)  Glucose, UA >=500 (*)    Ketones, ur 80 (*)    All other components within normal limits  SARS CORONAVIRUS 2 (HOSPITAL ORDER, [facility])  URINE CULTURE    EKG EKG Interpretation  Date/Time:  Sunday November 03 2018 13:46:55 EDT Ventricular Rate:  79 PR Interval:    QRS Duration: 94  QT Interval:  359 QTC Calculation: 409 R Axis:   40 Text Interpretation:  Sinus rhythm Inferior infarct, age indeterminate No significant change since last tracing Confirmed by Dorie Rank [phone]) on 11/03/2018 1:50:58 PM   Radiology Dg Chest 2 View  Result Date: 11/03/2018 CLINICAL DATA:  Generalized weakness, fever EXAM: CHEST - 2 VIEW COMPARISON:  03/21/2018 FINDINGS: The heart size and mediastinal contours are within normal limits. Both lungs are clear. The visualized skeletal structures are unremarkable. IMPRESSION: No active cardiopulmonary disease. Electronically Signed   By: Kathreen Devoid   On: 11/03/2018 15:10    Procedures Procedures (including critical care time)  Medications Ordered in ED Medications  sodium chloride 0.9 % bolus 1,000 mL (has no administration in time range)     Initial Impression / Assessment and Plan / ED Course  I have reviewed the triage vital signs and the nursing notes.  Pertinent labs & imaging results that were available during my care of the patient were reviewed by me and considered in my medical decision making (see chart for details).       Rebecca [name] is a 73 y.o. female who presents to ED for generalized weakness, myalgias today. Does report oral temperature of 102 at home two days ago, but reports fever has been 99-100 yesterday and afebrile today. On exam in ED, she is afebrile, hemodynamically stable with clear lung sounds and benign abdominal exam. Appears quite well. Labs reassuring. UA without signs of infection. CXR independently reviewed and clear - no PNA. Updated on results and re-evaluated. Remained well appearing, afebrile and hemodynamically stable. Will have her follow up with PCP. Discussed return precautions. All questions answered.   Patient seen by and discussed with Dr. Tomi Bamberger who agrees with treatment plan.    Final Clinical Impressions(s) / ED Diagnoses   Final diagnoses:  Fever    ED Discharge Orders    None        Ward, Ozella Almond, PA-C 11/03/18 1827    Dorie Rank, MD 11/05/18 [phone]

## 2018-11-03 NOTE — Discharge Instructions (Signed)
It was my pleasure taking care of you today!   Fortunately, your work up today was reassuring.   Rest, stay hydrated.   Call your primary care doctor tomorrow to schedule a follow up appointment. They will likely do a telephone visit.   Return to ER for new or worsening symptoms, any additional concerns.

## 2018-11-04 LAB — URINE CULTURE: Culture: NO GROWTH

## 2018-11-05 ENCOUNTER — Encounter: Payer: Self-pay | Admitting: Adult Health

## 2018-11-05 ENCOUNTER — Other Ambulatory Visit: Payer: Self-pay

## 2018-11-05 ENCOUNTER — Ambulatory Visit (INDEPENDENT_AMBULATORY_CARE_PROVIDER_SITE_OTHER): Payer: Medicare Other | Admitting: Adult Health

## 2018-11-05 DIAGNOSIS — I2510 Atherosclerotic heart disease of native coronary artery without angina pectoris: Secondary | ICD-10-CM | POA: Diagnosis not present

## 2018-11-05 DIAGNOSIS — B349 Viral infection, unspecified: Secondary | ICD-10-CM | POA: Diagnosis not present

## 2018-11-05 NOTE — Progress Notes (Signed)
Virtual Visit via Video Note  I connected with Rebecca [name]  on 11/05/18 at  8:30 AM EDT by a video enabled telemedicine application and verified that I am speaking with the correct person using two identifiers.  Location patient: home Location provider:work or home office Persons participating in the virtual visit: patient, provider  I discussed the limitations of evaluation and management by telemedicine and the availability of in person appointments. The patient expressed understanding and agreed to proceed.   HPI: 73 year old female who is being evaluated today for follow-up after recent ER visit.  Presented to the emergency room 2 days ago complaining of generalized weakness and fatigue for 3 days prior.  At onset of symptoms she had a oral temperature of 102.  She did report a cough mostly at night which she attributed to postnasal drip.  She came to the emergency room because her myalgias and malaise is worse today than it had been in the previous 3 days.  She denied any abdominal pain, diarrhea, nausea, or vomiting.  She had no shortness of breath or chest pain.  Other than walking around the neighborhood with her husband in the afternoon she had not left the house in a few weeks.  She was worked up for [place] came back negative.  Urinalysis was negative CMP showed slightly decreased sodium level at 129.  She was given fluids in the emergency room.  CBC and chest x-ray were normal  Today she reports that she is feeling much improved since her symptoms started.  She continues to have mild body aches and feels shaky.  She did have an episode of night sweats last night.  She is taking Tylenol Motrin as needed and reports that this helps she is also staying well-hydrated.  She is checking her blood sugars and reports that she has stopped Invokana for the time being because she is not eating much.  ROS: See pertinent positives and negatives per HPI.  Past Medical History:   Diagnosis Date  . Asthma    extrinsic  . CAD (coronary artery disease)   . Chest pain, atypical   . Diabetes ([place])   . Edema   . Fatigue   . Fatigue   . GERD (gastroesophageal reflux disease)   . Hyperlipidemia   . Hypertension    familiar  . Hypothyroidism   . IBS (irritable bowel syndrome)    hx  . Keratosis, seborrheic   . Labyrinthitis   . MI (myocardial infarction) ([place])    age 73  . Osteoarthritis   . S/P arthroscopy of right shoulder 03/01/2018  . Skin cancer    Melanoma     Past Surgical History:  Procedure Laterality Date  . Breast Biopsy     dense breast tissue  . BREAST EXCISIONAL BIOPSY Left 2000   benign  . CHOLECYSTECTOMY    . EUS  04/04/2012   Procedure: UPPER ENDOSCOPIC ULTRASOUND (EUS) LINEAR;  Surgeon: Milus Banister, MD;  Location: [facility];  Service: Endoscopy;  Laterality: N/A;  radial linear  . KNEE ARTHROPLASTY     total right  02-28-2010 Dr Pilar Plate Aluisio, left  . KNEE ARTHROPLASTY Bilateral 09/12/2017   Hector Shade  . LEFT HEART CATH AND CORONARY ANGIOGRAPHY N/A 04/20/2017   Procedure: LEFT HEART CATH AND CORONARY ANGIOGRAPHY;  Surgeon: Burnell Blanks, MD;  Location: [facility];  Service: Cardiovascular;  Laterality: N/A;  . TUBAL LIGATION      Family History  Problem Relation  Age of Onset  . Heart disease Mother   . Hypertension Mother   . Heart attack Mother 67       MULTIPLES OVER TIME  . Melanoma Daughter 72       METASTATIC  . Breast cancer Sister   . Hypertension Father   . Hypertension Sister      Current Outpatient Medications:  .  ACCU-CHEK FASTCLIX LANCETS MISC, USE   TO CHECK GLUCOSE TWICE DAILY, Disp: 102 each, Rfl: 3 .  ACCU-CHEK GUIDE test strip, Use to test blood glucose twice daily, Disp: 200 each, Rfl: 3 .  acetaminophen (TYLENOL) 325 MG tablet, Take 650 mg by mouth every 6 (six) hours as needed.  , Disp: , Rfl:  .  albuterol (PROVENTIL HFA;VENTOLIN HFA) 108 (90 Base) MCG/ACT inhaler, Inhale 2  puffs into the lungs every 6 (six) hours as needed for wheezing or shortness of breath., Disp: 1 Inhaler, Rfl: 2 .  aspirin EC 81 MG tablet, Take 1 tablet (81 mg total) by mouth daily., Disp: 90 tablet, Rfl: 3 .  atorvastatin (LIPITOR) 80 MG tablet, Take 1 tablet (80 mg total) by mouth daily., Disp: 90 tablet, Rfl: 3 .  benzonatate (TESSALON) 200 MG capsule, Take 1 capsule (200 mg total) by mouth 3 (three) times daily as needed for cough., Disp: 30 capsule, Rfl: 1 .  calcium carbonate (OS-CAL) 600 MG TABS, Take 1,200 mg by mouth daily. , Disp: , Rfl:  .  Cholecalciferol (VITAMIN D) 2000 UNITS CAPS, Take 1 capsule by mouth daily.  , Disp: , Rfl:  .  conjugated estrogens (PREMARIN) vaginal cream, Place 0.5 Applicatorfuls vaginally every 7 (seven) days., Disp: 42.5 g, Rfl: 6 .  denosumab (PROLIA) 60 MG/ML SOSY injection, Inject 60 mg into the skin every 6 (six) months., Disp: , Rfl:  .  diltiazem (CARDIZEM CD) 180 MG 24 hr capsule, Take 1 capsule (180 mg total) by mouth daily., Disp: 90 capsule, Rfl: 3 .  fluticasone (FLOVENT HFA) 44 MCG/ACT inhaler, Inhale 2 puffs into the lungs 2 (two) times daily., Disp: 1 Inhaler, Rfl: 0 .  furosemide (LASIX) 20 MG tablet, Take 1 tablet (20 mg total) by mouth 2 (two) times daily., Disp: 180 tablet, Rfl: 3 .  HYDROcodone-homatropine (HYCODAN) 5-1.5 MG/5ML syrup, Take 5 mLs by mouth every 8 (eight) hours as needed for cough., Disp: 120 mL, Rfl: 0 .  INVOKANA 100 MG TABS tablet, TAKE 1 TABLET BY MOUTH  DAILY BEFORE BREAKFAST, Disp: 90 tablet, Rfl: 1 .  levothyroxine (SYNTHROID, LEVOTHROID) 50 MCG tablet, TAKE 1 TABLET BY MOUTH  DAILY BEFORE BREAKFAST, Disp: 90 tablet, Rfl: 3 .  Naphazoline-Polyethyl Glycol (EYE DROPS) 0.012-0.2 % SOLN, Apply to eye as needed. Artifical eye drops, Disp: , Rfl:  .  nitroGLYCERIN (NITROSTAT) 0.4 MG SL tablet, DISSOLVE 1 TABLET UNDER THE TONGUE EVERY 5 MINUTES AS  NEEDED FOR CHEST PAIN. MAX  3 DOSES IN 15 MINUTES. CALL 911 IF CHEST PAIN  PERSISTS, Disp: 100 tablet, Rfl: 6 .  Omega-3 Fatty Acids (FISH OIL) 1200 MG CAPS, Take by mouth.  , Disp: , Rfl:  .  OVER THE COUNTER MEDICATION, CBD OIL - SHOULDER PAIN, Disp: , Rfl:  .  potassium chloride SA (K-DUR,KLOR-CON) 20 MEQ tablet, Take 1 tablet (20 mEq total) by mouth 2 (two) times daily., Disp: 180 tablet, Rfl: 3 .  traZODone (DESYREL) 50 MG tablet, TAKE 1/2 TO 1 TABLET BY  MOUTH AT BEDTIME AS NEEDED  FOR SLEEP, Disp: 90 tablet, Rfl:  1  EXAM:  VITALS per patient if applicable:  GENERAL: alert, oriented, appears well and in no acute distress  HEENT: atraumatic, conjunttiva clear, no obvious abnormalities on inspection of external nose and ears  NECK: normal movements of the head and neck  LUNGS: on inspection no signs of respiratory distress, breathing rate appears normal, no obvious gross SOB, gasping or wheezing  CV: no obvious cyanosis  MS: moves all visible extremities without noticeable abnormality  PSYCH/NEURO: pleasant and cooperative, no obvious depression or anxiety, speech and thought processing grossly intact  ASSESSMENT AND PLAN:  Discussed the following assessment and plan:  Viral illness -Likely viral illness, she is improving.  Her temperature this morning was 98.8.  Continue to take Tylenol Motrin as needed stay well-hydrated, and rest as much as possible.  She was advised to follow-up in the next 2 or 3 days if symptoms have not resolved    I discussed the assessment and treatment plan with the patient. The patient was provided an opportunity to ask questions and all were answered. The patient agreed with the plan and demonstrated an understanding of the instructions.   The patient was advised to call back or seek an in-person evaluation if the symptoms worsen or if the condition fails to improve as anticipated.   [name], NP

## 2018-11-06 ENCOUNTER — Encounter: Payer: Self-pay | Admitting: Adult Health

## 2018-11-11 ENCOUNTER — Ambulatory Visit: Payer: Medicare Other | Admitting: Cardiology

## 2018-11-12 ENCOUNTER — Ambulatory Visit (INDEPENDENT_AMBULATORY_CARE_PROVIDER_SITE_OTHER): Payer: Medicare Other | Admitting: Adult Health

## 2018-11-12 ENCOUNTER — Other Ambulatory Visit: Payer: Self-pay

## 2018-11-12 ENCOUNTER — Encounter: Payer: Self-pay | Admitting: Adult Health

## 2018-11-12 DIAGNOSIS — I2510 Atherosclerotic heart disease of native coronary artery without angina pectoris: Secondary | ICD-10-CM | POA: Diagnosis not present

## 2018-11-12 DIAGNOSIS — H6690 Otitis media, unspecified, unspecified ear: Secondary | ICD-10-CM | POA: Diagnosis not present

## 2018-11-12 MED ORDER — AMOXICILLIN-POT CLAVULANATE 875-125 MG PO TABS: 1.0000 | ORAL_TABLET | Freq: Two times a day (BID) | ORAL | 0 refills | Status: DC

## 2018-11-12 NOTE — Progress Notes (Signed)
Virtual Visit via Video Note  I connected with Rebecca [name] on 11/12/18 at  8:00 AM EDT by a video enabled telemedicine application and verified that I am speaking with the correct person using two identifiers.  Location patient: home Location provider:work or home office Persons participating in the virtual visit: patient, provider  I discussed the limitations of evaluation and management by telemedicine and the availability of in person appointments. The patient expressed understanding and agreed to proceed.   HPI: 73 year old female who is being evaluated today for concern of URI/infection.  She reports that over the weekend she spiked a fever greatest has been 101.2.  She has been using Tylenol which keeps her fever down into the 99.8 degree range she has had some normal temperatures.  She also reports body aches but no chills, intermittent episodes of diaphoresis, and right intermittent ear pain with a feeling of fullness.  She denies any drainage from the ear.  She also denies any shortness of breath or chest pain.  Last week she thought she might be developing a secondary sinus infection but reports that this seems to have resolved.  She does not have any diarrhea or vomiting.  She was seen in the emergency room about 10 days ago for generalized weakness and fatigue, was tested for COVID but this came back negative.  She has been social distancing since this time.   ROS: See pertinent positives and negatives per HPI.  Past Medical History:  Diagnosis Date  . Asthma    extrinsic  . CAD (coronary artery disease)   . Chest pain, atypical   . Diabetes ([place])   . Edema   . Fatigue   . Fatigue   . GERD (gastroesophageal reflux disease)   . Hyperlipidemia   . Hypertension    familiar  . Hypothyroidism   . IBS (irritable bowel syndrome)    hx  . Keratosis, seborrheic   . Labyrinthitis   . MI (myocardial infarction) ([place])    age 79  . Osteoarthritis   . S/P arthroscopy of right  shoulder 03/01/2018  . Skin cancer    Melanoma     Past Surgical History:  Procedure Laterality Date  . Breast Biopsy     dense breast tissue  . BREAST EXCISIONAL BIOPSY Left 2000   benign  . CHOLECYSTECTOMY    . EUS  04/04/2012   Procedure: UPPER ENDOSCOPIC ULTRASOUND (EUS) LINEAR;  Surgeon: Milus Banister, MD;  Location: [facility];  Service: Endoscopy;  Laterality: N/A;  radial linear  . KNEE ARTHROPLASTY     total right  02-28-2010 Dr Pilar Plate Aluisio, left  . KNEE ARTHROPLASTY Bilateral 09/12/2017   Hector Shade  . LEFT HEART CATH AND CORONARY ANGIOGRAPHY N/A 04/20/2017   Procedure: LEFT HEART CATH AND CORONARY ANGIOGRAPHY;  Surgeon: Burnell Blanks, MD;  Location: [facility];  Service: Cardiovascular;  Laterality: N/A;  . TUBAL LIGATION      Family History  Problem Relation Age of Onset  . Heart disease Mother   . Hypertension Mother   . Heart attack Mother 78       MULTIPLES OVER TIME  . Melanoma Daughter 10       METASTATIC  . Breast cancer Sister   . Hypertension Father   . Hypertension Sister       Current Outpatient Medications:  .  ACCU-CHEK FASTCLIX LANCETS MISC, USE   TO CHECK GLUCOSE TWICE DAILY, Disp: 102 each, Rfl: 3 .  ACCU-CHEK GUIDE  test strip, Use to test blood glucose twice daily, Disp: 200 each, Rfl: 3 .  acetaminophen (TYLENOL) 325 MG tablet, Take 650 mg by mouth every 6 (six) hours as needed.  , Disp: , Rfl:  .  albuterol (PROVENTIL HFA;VENTOLIN HFA) 108 (90 Base) MCG/ACT inhaler, Inhale 2 puffs into the lungs every 6 (six) hours as needed for wheezing or shortness of breath., Disp: 1 Inhaler, Rfl: 2 .  amoxicillin-clavulanate (AUGMENTIN) 875-125 MG tablet, Take 1 tablet by mouth 2 (two) times daily., Disp: 20 tablet, Rfl: 0 .  aspirin EC 81 MG tablet, Take 1 tablet (81 mg total) by mouth daily., Disp: 90 tablet, Rfl: 3 .  atorvastatin (LIPITOR) 80 MG tablet, Take 1 tablet (80 mg total) by mouth daily., Disp: 90 tablet, Rfl: 3 .   benzonatate (TESSALON) 200 MG capsule, Take 1 capsule (200 mg total) by mouth 3 (three) times daily as needed for cough., Disp: 30 capsule, Rfl: 1 .  calcium carbonate (OS-CAL) 600 MG TABS, Take 1,200 mg by mouth daily. , Disp: , Rfl:  .  Cholecalciferol (VITAMIN D) 2000 UNITS CAPS, Take 1 capsule by mouth daily.  , Disp: , Rfl:  .  conjugated estrogens (PREMARIN) vaginal cream, Place 0.5 Applicatorfuls vaginally every 7 (seven) days., Disp: 42.5 g, Rfl: 6 .  denosumab (PROLIA) 60 MG/ML SOSY injection, Inject 60 mg into the skin every 6 (six) months., Disp: , Rfl:  .  diltiazem (CARDIZEM CD) 180 MG 24 hr capsule, Take 1 capsule (180 mg total) by mouth daily., Disp: 90 capsule, Rfl: 3 .  fluticasone (FLOVENT HFA) 44 MCG/ACT inhaler, Inhale 2 puffs into the lungs 2 (two) times daily., Disp: 1 Inhaler, Rfl: 0 .  furosemide (LASIX) 20 MG tablet, Take 1 tablet (20 mg total) by mouth 2 (two) times daily., Disp: 180 tablet, Rfl: 3 .  HYDROcodone-homatropine (HYCODAN) 5-1.5 MG/5ML syrup, Take 5 mLs by mouth every 8 (eight) hours as needed for cough., Disp: 120 mL, Rfl: 0 .  INVOKANA 100 MG TABS tablet, TAKE 1 TABLET BY MOUTH  DAILY BEFORE BREAKFAST, Disp: 90 tablet, Rfl: 1 .  levothyroxine (SYNTHROID, LEVOTHROID) 50 MCG tablet, TAKE 1 TABLET BY MOUTH  DAILY BEFORE BREAKFAST, Disp: 90 tablet, Rfl: 3 .  Naphazoline-Polyethyl Glycol (EYE DROPS) 0.012-0.2 % SOLN, Apply to eye as needed. Artifical eye drops, Disp: , Rfl:  .  nitroGLYCERIN (NITROSTAT) 0.4 MG SL tablet, DISSOLVE 1 TABLET UNDER THE TONGUE EVERY 5 MINUTES AS  NEEDED FOR CHEST PAIN. MAX  3 DOSES IN 15 MINUTES. CALL 911 IF CHEST PAIN PERSISTS, Disp: 100 tablet, Rfl: 6 .  Omega-3 Fatty Acids (FISH OIL) 1200 MG CAPS, Take by mouth.  , Disp: , Rfl:  .  OVER THE COUNTER MEDICATION, CBD OIL - SHOULDER PAIN, Disp: , Rfl:  .  potassium chloride SA (K-DUR,KLOR-CON) 20 MEQ tablet, Take 1 tablet (20 mEq total) by mouth 2 (two) times daily., Disp: 180 tablet, Rfl:  3 .  traZODone (DESYREL) 50 MG tablet, TAKE 1/2 TO 1 TABLET BY  MOUTH AT BEDTIME AS NEEDED  FOR SLEEP, Disp: 90 tablet, Rfl: 1  EXAM:  VITALS per patient if applicable:  GENERAL: alert, oriented, appears well and in no acute distress  HEENT: atraumatic, conjunttiva clear, no obvious abnormalities on inspection of external nose and ears  NECK: normal movements of the head and neck  LUNGS: on inspection no signs of respiratory distress, breathing rate appears normal, no obvious gross SOB, gasping or wheezing  CV: no  obvious cyanosis  MS: moves all visible extremities without noticeable abnormality  PSYCH/NEURO: pleasant and cooperative, no obvious depression or anxiety, speech and thought processing grossly intact  ASSESSMENT AND PLAN:  Discussed the following assessment and plan:  Acute otitis media, unspecified otitis media type - Plan: amoxicillin-clavulanate (AUGMENTIN) 875-125 MG tablet  I discussed the assessment and treatment plan with the patient. The patient was provided an opportunity to ask questions and all were answered. The patient agreed with the plan and demonstrated an understanding of the instructions.   The patient was advised to call back or seek an in-person evaluation if the symptoms worsen or if the condition fails to improve as anticipated.   [name], NP

## 2018-11-14 ENCOUNTER — Other Ambulatory Visit: Payer: Self-pay

## 2018-11-14 ENCOUNTER — Ambulatory Visit (INDEPENDENT_AMBULATORY_CARE_PROVIDER_SITE_OTHER): Payer: Medicare Other | Admitting: Adult Health

## 2018-11-14 ENCOUNTER — Encounter: Payer: Self-pay | Admitting: Adult Health

## 2018-11-14 ENCOUNTER — Other Ambulatory Visit: Payer: Self-pay | Admitting: Adult Health

## 2018-11-14 DIAGNOSIS — G4700 Insomnia, unspecified: Secondary | ICD-10-CM | POA: Diagnosis not present

## 2018-11-14 DIAGNOSIS — I2510 Atherosclerotic heart disease of native coronary artery without angina pectoris: Secondary | ICD-10-CM | POA: Diagnosis not present

## 2018-11-14 DIAGNOSIS — I10 Essential (primary) hypertension: Secondary | ICD-10-CM

## 2018-11-14 DIAGNOSIS — E782 Mixed hyperlipidemia: Secondary | ICD-10-CM | POA: Diagnosis not present

## 2018-11-14 DIAGNOSIS — Z760 Encounter for issue of repeat prescription: Secondary | ICD-10-CM | POA: Diagnosis not present

## 2018-11-14 DIAGNOSIS — E039 Hypothyroidism, unspecified: Secondary | ICD-10-CM | POA: Diagnosis not present

## 2018-11-14 DIAGNOSIS — J40 Bronchitis, not specified as acute or chronic: Secondary | ICD-10-CM | POA: Diagnosis not present

## 2018-11-14 DIAGNOSIS — E119 Type 2 diabetes mellitus without complications: Secondary | ICD-10-CM

## 2018-11-14 MED ORDER — POTASSIUM CHLORIDE CRYS ER 20 MEQ PO TBCR: 20.0000 meq | EXTENDED_RELEASE_TABLET | Freq: Two times a day (BID) | ORAL | 3 refills | Status: DC

## 2018-11-14 MED ORDER — FLUTICASONE PROPIONATE HFA 44 MCG/ACT IN AERO: 2.0000 | INHALATION_SPRAY | Freq: Two times a day (BID) | RESPIRATORY_TRACT | 3 refills | Status: DC

## 2018-11-14 MED ORDER — FUROSEMIDE 20 MG PO TABS: 20.0000 mg | ORAL_TABLET | Freq: Two times a day (BID) | ORAL | 3 refills | Status: DC

## 2018-11-14 MED ORDER — ATORVASTATIN CALCIUM 80 MG PO TABS: 80.0000 mg | ORAL_TABLET | Freq: Every day | ORAL | 3 refills | Status: DC

## 2018-11-14 MED ORDER — ALBUTEROL SULFATE HFA 108 (90 BASE) MCG/ACT IN AERS: 2.0000 | INHALATION_SPRAY | Freq: Four times a day (QID) | RESPIRATORY_TRACT | 2 refills | Status: DC | PRN

## 2018-11-14 MED ORDER — TRAZODONE HCL 50 MG PO TABS: 25.0000 mg | ORAL_TABLET | Freq: Every evening | ORAL | 1 refills | Status: DC | PRN

## 2018-11-14 MED ORDER — DILTIAZEM HCL ER COATED BEADS 180 MG PO CP24: 180.0000 mg | ORAL_CAPSULE | Freq: Every day | ORAL | 3 refills | Status: DC

## 2018-11-14 MED ORDER — GLIPIZIDE ER 2.5 MG PO TB24: 2.5000 mg | ORAL_TABLET | Freq: Every day | ORAL | 0 refills | Status: DC

## 2018-11-14 MED ORDER — LEVOTHYROXINE SODIUM 50 MCG PO TABS: ORAL_TABLET | ORAL | 3 refills | Status: DC

## 2018-11-14 MED ORDER — ACCU-CHEK FASTCLIX LANCETS MISC: 3 refills | Status: DC

## 2018-11-14 MED ORDER — ESTROGENS, CONJUGATED 0.625 MG/GM VA CREA: 1.0000 g | TOPICAL_CREAM | VAGINAL | 6 refills | Status: DC

## 2018-11-14 MED ORDER — ACCU-CHEK GUIDE VI STRP: ORAL_STRIP | 3 refills | Status: DC

## 2018-11-14 NOTE — Telephone Encounter (Signed)
Should pt have recent a1C?

## 2018-11-14 NOTE — Progress Notes (Signed)
Virtual Visit via Video Note  I connected with Rebecca [name] on 11/14/18 at  9:30 AM EDT by a video enabled telemedicine application and verified that I am speaking with the correct person using two identifiers.  Location patient: home Location provider:work or home office Persons participating in the virtual visit: patient, provider  I discussed the limitations of evaluation and management by telemedicine and the availability of in person appointments. The patient expressed understanding and agreed to proceed.   HPI: 73 year old female who is being evaluated today for yearly follow-up history of hypothyroidism, hypertension, hyperlipidemia, diabetes mellitus and CAD.  Was recently diagnosed with possible ear infection was prescribed Augmentin earlier in the week.  She reports that since starting Augmentin she has been feeling much better, has not had a fever and overall is improving.  Due to COVID-19 she is unable to come into the office for routine labs but does need some medication refills.  DM -is currently prescribed Invokana 100 mg tablets.  She has been monitoring her blood is at home and reports some elevated readings, with most readings being between 120-140.  She had some glipizide leftover from a previous prescription before we started her on Invokana and has been cutting this in thirds.  She reports that her blood sugars have responded well to the glipizide is wondering if she can go back on this medication and give it a try.  This was originally stopped due to cramping but she has not experienced any cramping since restarting it.  I am okay with guarding glipizide 2.5 mg extended release to see how she responds. Lab Results  Component Value Date   HGBA1C 6.5 02/07/2018     Essential Hypertension -only prescribed Lasix 20 mg twice daily and Cardizem 180 mg daily.  She reports that she does check her blood pressure at home and all of her blood pressure readings have been within normal  limits.  Denies any chest pain or shortness of breath. BP Readings from Last 3 Encounters:  11/03/18 126/62  08/28/18 130/84  03/21/18 140/66   Hyperlipidemia -very well controlled in the past with Lipitor 80 mg as well as aspirin 81 mg tablets daily Lab Results  Component Value Date   CHOL 201 (H) 11/09/2017   HDL 50.70 11/09/2017   LDLCALC 126 (H) 11/09/2017   TRIG 125.0 11/09/2017   CHOLHDL 4 11/09/2017   Hypothyroidism -controlled for an extended period of time on Synthroid 50 mcg every morning  Lab Results  Component Value Date   TSH 0.993 08/28/2018      ROS: See pertinent positives and negatives per HPI.  Past Medical History:  Diagnosis Date  . Asthma    extrinsic  . CAD (coronary artery disease)   . Chest pain, atypical   . Diabetes ([place])   . Edema   . Fatigue   . Fatigue   . GERD (gastroesophageal reflux disease)   . Hyperlipidemia   . Hypertension    familiar  . Hypothyroidism   . IBS (irritable bowel syndrome)    hx  . Keratosis, seborrheic   . Labyrinthitis   . MI (myocardial infarction) ([place])    age 64  . Osteoarthritis   . S/P arthroscopy of right shoulder 03/01/2018  . Skin cancer    Melanoma     Past Surgical History:  Procedure Laterality Date  . Breast Biopsy     dense breast tissue  . BREAST EXCISIONAL BIOPSY Left 2000   benign  . CHOLECYSTECTOMY    .  EUS  04/04/2012   Procedure: UPPER ENDOSCOPIC ULTRASOUND (EUS) LINEAR;  Surgeon: Milus Banister, MD;  Location: [facility];  Service: Endoscopy;  Laterality: N/A;  radial linear  . KNEE ARTHROPLASTY     total right  02-28-2010 Dr Pilar Plate Aluisio, left  . KNEE ARTHROPLASTY Bilateral 09/12/2017   Hector Shade  . LEFT HEART CATH AND CORONARY ANGIOGRAPHY N/A 04/20/2017   Procedure: LEFT HEART CATH AND CORONARY ANGIOGRAPHY;  Surgeon: Burnell Blanks, MD;  Location: [facility];  Service: Cardiovascular;  Laterality: N/A;  . TUBAL LIGATION      Family History  Problem  Relation Age of Onset  . Heart disease Mother   . Hypertension Mother   . Heart attack Mother 38       MULTIPLES OVER TIME  . Melanoma Daughter 79       METASTATIC  . Breast cancer Sister   . Hypertension Father   . Hypertension Sister       Current Outpatient Medications:  .  Accu-Chek FastClix Lancets MISC, USE   TO CHECK GLUCOSE TWICE DAILY, Disp: 102 each, Rfl: 3 .  ACCU-CHEK GUIDE test strip, Use to test blood glucose twice daily, Disp: 200 each, Rfl: 3 .  acetaminophen (TYLENOL) 325 MG tablet, Take 650 mg by mouth every 6 (six) hours as needed.  , Disp: , Rfl:  .  albuterol (VENTOLIN HFA) 108 (90 Base) MCG/ACT inhaler, Inhale 2 puffs into the lungs every 6 (six) hours as needed for wheezing or shortness of breath., Disp: 1 Inhaler, Rfl: 2 .  amoxicillin-clavulanate (AUGMENTIN) 875-125 MG tablet, Take 1 tablet by mouth 2 (two) times daily., Disp: 20 tablet, Rfl: 0 .  aspirin EC 81 MG tablet, Take 1 tablet (81 mg total) by mouth daily., Disp: 90 tablet, Rfl: 3 .  atorvastatin (LIPITOR) 80 MG tablet, Take 1 tablet (80 mg total) by mouth daily., Disp: 90 tablet, Rfl: 3 .  calcium carbonate (OS-CAL) 600 MG TABS, Take 1,200 mg by mouth daily. , Disp: , Rfl:  .  Cholecalciferol (VITAMIN D) 2000 UNITS CAPS, Take 1 capsule by mouth daily.  , Disp: , Rfl:  .  conjugated estrogens (PREMARIN) vaginal cream, Place 0.5 Applicatorfuls vaginally every 7 (seven) days., Disp: 42.5 g, Rfl: 6 .  denosumab (PROLIA) 60 MG/ML SOSY injection, Inject 60 mg into the skin every 6 (six) months., Disp: , Rfl:  .  diltiazem (CARDIZEM CD) 180 MG 24 hr capsule, Take 1 capsule (180 mg total) by mouth daily., Disp: 90 capsule, Rfl: 3 .  fluticasone (FLOVENT HFA) 44 MCG/ACT inhaler, Inhale 2 puffs into the lungs 2 (two) times daily., Disp: 1 Inhaler, Rfl: 3 .  furosemide (LASIX) 20 MG tablet, Take 1 tablet (20 mg total) by mouth 2 (two) times daily., Disp: 180 tablet, Rfl: 3 .  glipiZIDE (GLUCOTROL XL) 2.5 MG 24 hr  tablet, Take 1 tablet (2.5 mg total) by mouth daily with breakfast., Disp: 30 tablet, Rfl: 0 .  levothyroxine (SYNTHROID) 50 MCG tablet, TAKE 1 TABLET BY MOUTH  DAILY BEFORE BREAKFAST, Disp: 90 tablet, Rfl: 3 .  Naphazoline-Polyethyl Glycol (EYE DROPS) 0.012-0.2 % SOLN, Apply to eye as needed. Artifical eye drops, Disp: , Rfl:  .  nitroGLYCERIN (NITROSTAT) 0.4 MG SL tablet, DISSOLVE 1 TABLET UNDER THE TONGUE EVERY 5 MINUTES AS  NEEDED FOR CHEST PAIN. MAX  3 DOSES IN 15 MINUTES. CALL 911 IF CHEST PAIN PERSISTS, Disp: 100 tablet, Rfl: 6 .  Omega-3 Fatty Acids (FISH OIL)  1200 MG CAPS, Take by mouth.  , Disp: , Rfl:  .  OVER THE COUNTER MEDICATION, CBD OIL - SHOULDER PAIN, Disp: , Rfl:  .  potassium chloride SA (K-DUR) 20 MEQ tablet, Take 1 tablet (20 mEq total) by mouth 2 (two) times daily., Disp: 180 tablet, Rfl: 3 .  traZODone (DESYREL) 50 MG tablet, Take 0.5-1 tablets (25-50 mg total) by mouth at bedtime as needed. for sleep, Disp: 90 tablet, Rfl: 1  EXAM:  VITALS per patient if applicable:  GENERAL: alert, oriented, appears well and in no acute distress  HEENT: atraumatic, conjunttiva clear, no obvious abnormalities on inspection of external nose and ears  NECK: normal movements of the head and neck  LUNGS: on inspection no signs of respiratory distress, breathing rate appears normal, no obvious gross SOB, gasping or wheezing  CV: no obvious cyanosis  MS: moves all visible extremities without noticeable abnormality  PSYCH/NEURO: pleasant and cooperative, no obvious depression or anxiety, speech and thought processing grossly intact  ASSESSMENT AND PLAN:  Discussed the following assessment and plan:  We will refill her medications and she will follow-up for CPE once COVID restrictions have lifted.  She was advised to follow-up within 30 days for review of blood sugars due to change in medication.  She knows to follow-up sooner if needed.  Continue to eat heart healthy diet and  exercise  Essential hypertension - Plan: diltiazem (CARDIZEM CD) 180 MG 24 hr capsule, furosemide (LASIX) 20 MG tablet, potassium chloride SA (K-DUR) 20 MEQ tablet  Diabetes mellitus without complication (HCC) - Plan: Accu-Chek FastClix Lancets MISC, ACCU-CHEK GUIDE test strip, glipiZIDE (GLUCOTROL XL) 2.5 MG 24 hr tablet  Bronchitis - Plan: albuterol (VENTOLIN HFA) 108 (90 Base) MCG/ACT inhaler  Atherosclerosis of native coronary artery of native heart without angina pectoris - Plan: atorvastatin (LIPITOR) 80 MG tablet, diltiazem (CARDIZEM CD) 180 MG 24 hr capsule  Medication refill - Plan: conjugated estrogens (PREMARIN) vaginal cream, furosemide (LASIX) 20 MG tablet  Insomnia, unspecified type - Plan: traZODone (DESYREL) 50 MG tablet  Mixed hyperlipidemia - Plan: atorvastatin (LIPITOR) 80 MG tablet  Hypothyroidism, unspecified type - Plan: levothyroxine (SYNTHROID) 50 MCG tablet   I discussed the assessment and treatment plan with the patient. The patient was provided an opportunity to ask questions and all were answered. The patient agreed with the plan and demonstrated an understanding of the instructions.   The patient was advised to call back or seek an in-person evaluation if the symptoms worsen or if the condition fails to improve as anticipated.   [name], NP

## 2018-11-18 ENCOUNTER — Other Ambulatory Visit: Payer: Self-pay | Admitting: Family Medicine

## 2018-11-18 MED ORDER — PROAIR RESPICLICK 108 (90 BASE) MCG/ACT IN AEPB: 2.0000 | INHALATION_SPRAY | Freq: Four times a day (QID) | RESPIRATORY_TRACT | 0 refills | Status: DC | PRN

## 2018-11-18 NOTE — Telephone Encounter (Signed)
Received a fax from [facility] that Ventolin is no longer covered.  Fax states rx needs to be written for ProAir HFA or ProAir Respiclick.

## 2018-12-04 ENCOUNTER — Encounter: Payer: Self-pay | Admitting: Adult Health

## 2018-12-30 ENCOUNTER — Encounter: Payer: Self-pay | Admitting: Adult Health

## 2019-01-01 ENCOUNTER — Ambulatory Visit (INDEPENDENT_AMBULATORY_CARE_PROVIDER_SITE_OTHER): Payer: Medicare Other | Admitting: *Deleted

## 2019-01-01 ENCOUNTER — Other Ambulatory Visit: Payer: Self-pay

## 2019-01-01 ENCOUNTER — Telehealth: Payer: Self-pay | Admitting: *Deleted

## 2019-01-01 DIAGNOSIS — M810 Age-related osteoporosis without current pathological fracture: Secondary | ICD-10-CM | POA: Diagnosis not present

## 2019-01-01 DIAGNOSIS — Q782 Osteopetrosis: Secondary | ICD-10-CM

## 2019-01-01 MED ORDER — DENOSUMAB 60 MG/ML ~~LOC~~ SOSY
60.0000 mg | PREFILLED_SYRINGE | Freq: Once | SUBCUTANEOUS | Status: AC
  Administered 2019-01-09: 60 mg via SUBCUTANEOUS

## 2019-01-01 NOTE — Progress Notes (Signed)
Per orders of Avon Gully NP, injection of  given by Westley Hummer. Patient tolerated injection well.

## 2019-01-01 NOTE — Telephone Encounter (Signed)
Copied from [place] [id number]. Topic: General - Inquiry >> Jan 01, 2019 11:58 AM Jeri Cos wrote: Reason for CRM: Pt needs a call from [facility] about the medications he ordered back on 11/14/18. [facility] stated they have nothing on file for the pt.

## 2019-01-01 NOTE — Telephone Encounter (Signed)
See MyChart message.  Nothing further needed.

## 2019-01-02 ENCOUNTER — Other Ambulatory Visit: Payer: Self-pay | Admitting: Family Medicine

## 2019-01-02 DIAGNOSIS — E119 Type 2 diabetes mellitus without complications: Secondary | ICD-10-CM

## 2019-01-02 MED ORDER — GLIPIZIDE ER 2.5 MG PO TB24: ORAL_TABLET | ORAL | 0 refills | Status: DC

## 2019-01-02 MED ORDER — CANAGLIFLOZIN 100 MG PO TABS: ORAL_TABLET | ORAL | 0 refills | Status: DC

## 2019-01-02 NOTE — Progress Notes (Signed)
Invokana and glipizide sent to the pharmacy by e-scribe.

## 2019-01-15 ENCOUNTER — Encounter: Payer: Self-pay | Admitting: Adult Health

## 2019-01-15 DIAGNOSIS — H524 Presbyopia: Secondary | ICD-10-CM | POA: Diagnosis not present

## 2019-01-15 DIAGNOSIS — Z961 Presence of intraocular lens: Secondary | ICD-10-CM | POA: Diagnosis not present

## 2019-01-15 DIAGNOSIS — H532 Diplopia: Secondary | ICD-10-CM | POA: Diagnosis not present

## 2019-01-15 DIAGNOSIS — E119 Type 2 diabetes mellitus without complications: Secondary | ICD-10-CM | POA: Diagnosis not present

## 2019-01-15 LAB — HM DIABETES EYE EXAM

## 2019-01-23 MED ORDER — DENOSUMAB 60 MG/ML ~~LOC~~ SOSY
60.0000 mg | PREFILLED_SYRINGE | Freq: Once | SUBCUTANEOUS | Status: AC
  Administered 2019-01-01: 60 mg via SUBCUTANEOUS

## 2019-01-23 NOTE — Addendum Note (Signed)
Addended by: Westley Hummer B on: 01/23/2019 02:26 PM   Modules accepted: Orders

## 2019-02-12 ENCOUNTER — Other Ambulatory Visit: Payer: Self-pay

## 2019-02-20 ENCOUNTER — Other Ambulatory Visit: Payer: Self-pay | Admitting: Adult Health

## 2019-02-20 DIAGNOSIS — E119 Type 2 diabetes mellitus without complications: Secondary | ICD-10-CM

## 2019-02-21 ENCOUNTER — Other Ambulatory Visit: Payer: Self-pay | Admitting: Family Medicine

## 2019-02-21 DIAGNOSIS — E119 Type 2 diabetes mellitus without complications: Secondary | ICD-10-CM

## 2019-02-21 NOTE — Telephone Encounter (Signed)
Ok to refill but can we get her to come in for an A1c

## 2019-02-21 NOTE — Telephone Encounter (Signed)
Sent to the pharmacy by e-scribe for 90 days.  Pt is scheduled for A1C and follow up.

## 2019-02-24 ENCOUNTER — Other Ambulatory Visit: Payer: Self-pay

## 2019-02-24 ENCOUNTER — Other Ambulatory Visit (INDEPENDENT_AMBULATORY_CARE_PROVIDER_SITE_OTHER): Payer: Medicare Other

## 2019-02-24 DIAGNOSIS — E119 Type 2 diabetes mellitus without complications: Secondary | ICD-10-CM | POA: Diagnosis not present

## 2019-02-24 LAB — HEMOGLOBIN A1C: Hgb A1c MFr Bld: 6.4 % (ref 4.6–6.5)

## 2019-02-25 ENCOUNTER — Telehealth (INDEPENDENT_AMBULATORY_CARE_PROVIDER_SITE_OTHER): Payer: Medicare Other | Admitting: Adult Health

## 2019-02-25 DIAGNOSIS — E119 Type 2 diabetes mellitus without complications: Secondary | ICD-10-CM | POA: Diagnosis not present

## 2019-02-25 DIAGNOSIS — I2510 Atherosclerotic heart disease of native coronary artery without angina pectoris: Secondary | ICD-10-CM | POA: Diagnosis not present

## 2019-02-25 NOTE — Progress Notes (Signed)
Virtual Visit via Video Note  I connected with [name] on 02/25/19 at  1:30 PM EDT by a video enabled telemedicine application and verified that I am speaking with the correct person using two identifiers.  Location patient: home Location provider:work or home office Persons participating in the virtual visit: patient, provider  I discussed the limitations of evaluation and management by telemedicine and the availability of in person appointments. The patient expressed understanding and agreed to proceed.   HPI: 73 year old female is being evaluated today for follow-up regarding diabetes.  Is currently maintained on Invokana 100 mg daily and takes glipizide 2.5 mg 3 days a week) if she takes it more frequently then she gets muscle cramps).  She reports that her blood sugars have been in the 115's.  Has not experienced any signs of hypoglycemia.  Urgently with the weather being hot and the gyms closed she has not had much physical activity but she tries to eat a heart healthy diet.   Overall, she reports that she is doing well and has no acute complaints.    ROS: See pertinent positives and negatives per HPI.  Past Medical History:  Diagnosis Date  . Asthma    extrinsic  . CAD (coronary artery disease)   . Chest pain, atypical   . Diabetes ([place])   . Edema   . Fatigue   . Fatigue   . GERD (gastroesophageal reflux disease)   . Hyperlipidemia   . Hypertension    familiar  . Hypothyroidism   . IBS (irritable bowel syndrome)    hx  . Keratosis, seborrheic   . Labyrinthitis   . MI (myocardial infarction) ([place])    age 45  . Osteoarthritis   . S/P arthroscopy of right shoulder 03/01/2018  . Skin cancer    Melanoma     Past Surgical History:  Procedure Laterality Date  . Breast Biopsy     dense breast tissue  . BREAST EXCISIONAL BIOPSY Left 2000   benign  . CHOLECYSTECTOMY    . EUS  04/04/2012   Procedure: UPPER ENDOSCOPIC ULTRASOUND (EUS) LINEAR;  Surgeon: Milus Banister, MD;  Location: [facility];  Service: Endoscopy;  Laterality: N/A;  radial linear  . KNEE ARTHROPLASTY     total right  02-28-2010 Dr Pilar Plate Aluisio, left  . KNEE ARTHROPLASTY Bilateral 09/12/2017   Hector Shade  . LEFT HEART CATH AND CORONARY ANGIOGRAPHY N/A 04/20/2017   Procedure: LEFT HEART CATH AND CORONARY ANGIOGRAPHY;  Surgeon: Burnell Blanks, MD;  Location: [facility];  Service: Cardiovascular;  Laterality: N/A;  . TUBAL LIGATION      Family History  Problem Relation Age of Onset  . Heart disease Mother   . Hypertension Mother   . Heart attack Mother 42       MULTIPLES OVER TIME  . Melanoma Daughter 61       METASTATIC  . Breast cancer Sister   . Hypertension Father   . Hypertension Sister       Current Outpatient Medications:  .  Accu-Chek FastClix Lancets MISC, USE   TO CHECK GLUCOSE TWICE DAILY, Disp: 102 each, Rfl: 3 .  ACCU-CHEK GUIDE test strip, Use to test blood glucose twice daily, Disp: 200 each, Rfl: 3 .  acetaminophen (TYLENOL) 325 MG tablet, Take 650 mg by mouth every 6 (six) hours as needed.  , Disp: , Rfl:  .  amoxicillin-clavulanate (AUGMENTIN) 875-125 MG tablet, Take 1 tablet by mouth 2 (  two) times daily., Disp: 20 tablet, Rfl: 0 .  aspirin EC 81 MG tablet, Take 1 tablet (81 mg total) by mouth daily., Disp: 90 tablet, Rfl: 3 .  atorvastatin (LIPITOR) 80 MG tablet, Take 1 tablet (80 mg total) by mouth daily., Disp: 90 tablet, Rfl: 3 .  calcium carbonate (OS-CAL) 600 MG TABS, Take 1,200 mg by mouth daily. , Disp: , Rfl:  .  Cholecalciferol (VITAMIN D) 2000 UNITS CAPS, Take 1 capsule by mouth daily.  , Disp: , Rfl:  .  conjugated estrogens (PREMARIN) vaginal cream, Place 0.5 Applicatorfuls vaginally every 7 (seven) days., Disp: 42.5 g, Rfl: 6 .  denosumab (PROLIA) 60 MG/ML SOSY injection, Inject 60 mg into the skin every 6 (six) months., Disp: , Rfl:  .  diltiazem (CARDIZEM CD) 180 MG 24 hr capsule, Take 1 capsule (180 mg total) by mouth  daily., Disp: 90 capsule, Rfl: 3 .  fluticasone (FLOVENT HFA) 44 MCG/ACT inhaler, Inhale 2 puffs into the lungs 2 (two) times daily., Disp: 1 Inhaler, Rfl: 3 .  furosemide (LASIX) 20 MG tablet, Take 1 tablet (20 mg total) by mouth 2 (two) times daily., Disp: 180 tablet, Rfl: 3 .  glipiZIDE (GLUCOTROL XL) 2.5 MG 24 hr tablet, TAKE 1 TABLET BY MOUTH  DAILY WITH BREAKFAST, Disp: 90 tablet, Rfl: 0 .  INVOKANA 100 MG TABS tablet, TAKE 1 TABLET BY MOUTH  DAILY BEFORE BREAKFAST, Disp: 90 tablet, Rfl: 0 .  levothyroxine (SYNTHROID) 50 MCG tablet, TAKE 1 TABLET BY MOUTH  DAILY BEFORE BREAKFAST, Disp: 90 tablet, Rfl: 3 .  Naphazoline-Polyethyl Glycol (EYE DROPS) 0.012-0.2 % SOLN, Apply to eye as needed. Artifical eye drops, Disp: , Rfl:  .  nitroGLYCERIN (NITROSTAT) 0.4 MG SL tablet, DISSOLVE 1 TABLET UNDER THE TONGUE EVERY 5 MINUTES AS  NEEDED FOR CHEST PAIN. MAX  3 DOSES IN 15 MINUTES. CALL 911 IF CHEST PAIN PERSISTS, Disp: 100 tablet, Rfl: 6 .  Omega-3 Fatty Acids (FISH OIL) 1200 MG CAPS, Take by mouth.  , Disp: , Rfl:  .  OVER THE COUNTER MEDICATION, CBD OIL - SHOULDER PAIN, Disp: , Rfl:  .  potassium chloride SA (K-DUR) 20 MEQ tablet, Take 1 tablet (20 mEq total) by mouth 2 (two) times daily., Disp: 180 tablet, Rfl: 3 .  PROAIR RESPICLICK 638 (90 Base) MCG/ACT AEPB, Inhale 2 puffs into the lungs every 6 (six) hours as needed (for SOB or Wheezing)., Disp: 3 each, Rfl: 0 .  traZODone (DESYREL) 50 MG tablet, Take 0.5-1 tablets (25-50 mg total) by mouth at bedtime as needed. for sleep, Disp: 90 tablet, Rfl: 1  EXAM:  VITALS per patient if applicable:  GENERAL: alert, oriented, appears well and in no acute distress  HEENT: atraumatic, conjunttiva clear, no obvious abnormalities on inspection of external nose and ears  NECK: normal movements of the head and neck  LUNGS: on inspection no signs of respiratory distress, breathing rate appears normal, no obvious gross SOB, gasping or wheezing  CV: no  obvious cyanosis  MS: moves all visible extremities without noticeable abnormality  PSYCH/NEURO: pleasant and cooperative, no obvious depression or anxiety, speech and thought processing grossly intact  ASSESSMENT AND PLAN:  Discussed the following assessment and plan:  1. Diabetes mellitus without complication ([place]) - T7R today is 6.4  - Continue with current regimen  - Work on home exercises and continue to eat heart healthy  - Follow up in 6 months or sooner if needed     I discussed the  assessment and treatment plan with the patient. The patient was provided an opportunity to ask questions and all were answered. The patient agreed with the plan and demonstrated an understanding of the instructions.   The patient was advised to call back or seek an in-person evaluation if the symptoms worsen or if the condition fails to improve as anticipated.   [name], NP

## 2019-02-26 ENCOUNTER — Other Ambulatory Visit: Payer: Medicare Other

## 2019-04-17 ENCOUNTER — Ambulatory Visit (INDEPENDENT_AMBULATORY_CARE_PROVIDER_SITE_OTHER): Payer: Medicare Other

## 2019-04-17 ENCOUNTER — Other Ambulatory Visit: Payer: Self-pay

## 2019-04-17 DIAGNOSIS — Z23 Encounter for immunization: Secondary | ICD-10-CM

## 2019-05-15 DIAGNOSIS — M7061 Trochanteric bursitis, right hip: Secondary | ICD-10-CM | POA: Diagnosis not present

## 2019-05-15 DIAGNOSIS — M25551 Pain in right hip: Secondary | ICD-10-CM | POA: Diagnosis not present

## 2019-05-28 DIAGNOSIS — L814 Other melanin hyperpigmentation: Secondary | ICD-10-CM | POA: Diagnosis not present

## 2019-05-28 DIAGNOSIS — L570 Actinic keratosis: Secondary | ICD-10-CM | POA: Diagnosis not present

## 2019-05-28 DIAGNOSIS — L821 Other seborrheic keratosis: Secondary | ICD-10-CM | POA: Diagnosis not present

## 2019-05-28 DIAGNOSIS — D225 Melanocytic nevi of trunk: Secondary | ICD-10-CM | POA: Diagnosis not present

## 2019-05-28 DIAGNOSIS — Z85820 Personal history of malignant melanoma of skin: Secondary | ICD-10-CM | POA: Diagnosis not present

## 2019-05-28 DIAGNOSIS — Z86018 Personal history of other benign neoplasm: Secondary | ICD-10-CM | POA: Diagnosis not present

## 2019-05-28 DIAGNOSIS — Z23 Encounter for immunization: Secondary | ICD-10-CM | POA: Diagnosis not present

## 2019-05-28 DIAGNOSIS — Z85828 Personal history of other malignant neoplasm of skin: Secondary | ICD-10-CM | POA: Diagnosis not present

## 2019-06-05 ENCOUNTER — Other Ambulatory Visit: Payer: Self-pay | Admitting: Adult Health

## 2019-06-05 DIAGNOSIS — G4700 Insomnia, unspecified: Secondary | ICD-10-CM

## 2019-06-05 DIAGNOSIS — E119 Type 2 diabetes mellitus without complications: Secondary | ICD-10-CM

## 2019-06-05 NOTE — Telephone Encounter (Signed)
SENT TO THE PHARMACY BY E-SCRIBE. 

## 2019-06-26 DIAGNOSIS — M7061 Trochanteric bursitis, right hip: Secondary | ICD-10-CM | POA: Diagnosis not present

## 2019-06-26 DIAGNOSIS — M25551 Pain in right hip: Secondary | ICD-10-CM | POA: Diagnosis not present

## 2019-07-03 ENCOUNTER — Telehealth: Payer: Self-pay | Admitting: *Deleted

## 2019-07-03 NOTE — Telephone Encounter (Signed)
Prolia has been approved at no cost.

## 2019-07-03 NOTE — Telephone Encounter (Signed)
Insurance verification started.  Will be complete in 5 business days.

## 2019-07-04 ENCOUNTER — Ambulatory Visit: Payer: Medicare Other

## 2019-07-14 ENCOUNTER — Ambulatory Visit (INDEPENDENT_AMBULATORY_CARE_PROVIDER_SITE_OTHER): Payer: Medicare Other

## 2019-07-14 ENCOUNTER — Other Ambulatory Visit: Payer: Self-pay

## 2019-07-14 DIAGNOSIS — Q782 Osteopetrosis: Secondary | ICD-10-CM

## 2019-07-14 DIAGNOSIS — M810 Age-related osteoporosis without current pathological fracture: Secondary | ICD-10-CM | POA: Diagnosis not present

## 2019-07-14 MED ORDER — DENOSUMAB 60 MG/ML ~~LOC~~ SOSY
60.0000 mg | PREFILLED_SYRINGE | Freq: Once | SUBCUTANEOUS | Status: AC
  Administered 2019-07-14: 10:00:00 60 mg via SUBCUTANEOUS

## 2019-07-14 NOTE — Patient Instructions (Signed)
Health Maintenance Due  Topic Date Due  . FOOT EXAM  02/06/2018  . URINE MICROALBUMIN  11/10/2018    Depression screen [facility] 2/9 11/09/2017 11/07/2016 11/05/2015  Decreased Interest 0 0 0  Down, Depressed, Hopeless 0 0 0  PHQ - 2 Score 0 0 0  Altered sleeping - - -  Tired, decreased energy - - -  Change in appetite - - -  Feeling bad or failure about yourself  - - -  Trouble concentrating - - -  Moving slowly or fidgety/restless - - -  PHQ-9 Score - - -  Some recent data might be hidden

## 2019-07-15 NOTE — Progress Notes (Signed)
I have reviewed and agree with this plan   Braydan Marriott, NP  

## 2019-07-30 ENCOUNTER — Encounter: Payer: Self-pay | Admitting: Adult Health

## 2019-07-30 NOTE — Telephone Encounter (Signed)
Pt has been scheduled for a VV tomorrow.

## 2019-07-31 ENCOUNTER — Telehealth (INDEPENDENT_AMBULATORY_CARE_PROVIDER_SITE_OTHER): Payer: Medicare Other | Admitting: Adult Health

## 2019-07-31 ENCOUNTER — Other Ambulatory Visit: Payer: Self-pay

## 2019-07-31 ENCOUNTER — Encounter: Payer: Self-pay | Admitting: Adult Health

## 2019-07-31 DIAGNOSIS — E162 Hypoglycemia, unspecified: Secondary | ICD-10-CM

## 2019-07-31 NOTE — Progress Notes (Signed)
Virtual Visit via Video Note  I connected with [name] on 07/31/19 at  1:30 PM EST by a video enabled telemedicine application and verified that I am speaking with the correct person using two identifiers.  Location patient: home Location provider:work or home office Persons participating in the virtual visit: patient, provider  I discussed the limitations of evaluation and management by telemedicine and the availability of in person appointments. The patient expressed understanding and agreed to proceed.   HPI:  74 year old female who is being evaluated today for episodes of hypoglycemia.  Over the last 4 weeks she has had multiple glucose readings between 60 and 87.  Her glucose gets a slow she feels shaky and develops a headache.  It can take multiple hours for her symptoms to resolve.  Today she had 2 drops in 1 day lowest being around 60.  He has not changed her diet and is eating the same amount of carbs every day.  She currently takes Invokana 50 mg every morning.  Most of her episodes are happening in the a.m.  ROS: See pertinent positives and negatives per HPI.  Past Medical History:  Diagnosis Date  . Asthma    extrinsic  . CAD (coronary artery disease)   . Chest pain, atypical   . Diabetes ([place])   . Edema   . Fatigue   . Fatigue   . GERD (gastroesophageal reflux disease)   . Hyperlipidemia   . Hypertension    familiar  . Hypothyroidism   . IBS (irritable bowel syndrome)    hx  . Keratosis, seborrheic   . Labyrinthitis   . MI (myocardial infarction) ([place])    age 74  . Osteoarthritis   . S/P arthroscopy of right shoulder 03/01/2018  . Skin cancer    Melanoma     Past Surgical History:  Procedure Laterality Date  . Breast Biopsy     dense breast tissue  . BREAST EXCISIONAL BIOPSY Left 2000   benign  . CHOLECYSTECTOMY    . EUS  04/04/2012   Procedure: UPPER ENDOSCOPIC ULTRASOUND (EUS) LINEAR;  Surgeon: Milus Banister, MD;  Location: [facility];   Service: Endoscopy;  Laterality: N/A;  radial linear  . KNEE ARTHROPLASTY     total right  02-28-2010 Dr Pilar Plate Aluisio, left  . KNEE ARTHROPLASTY Bilateral 09/12/2017   Hector Shade  . LEFT HEART CATH AND CORONARY ANGIOGRAPHY N/A 04/20/2017   Procedure: LEFT HEART CATH AND CORONARY ANGIOGRAPHY;  Surgeon: Burnell Blanks, MD;  Location: [place];  Service: Cardiovascular;  Laterality: N/A;  . TUBAL LIGATION      Family History  Problem Relation Age of Onset  . Heart disease Mother   . Hypertension Mother   . Heart attack Mother 40       MULTIPLES OVER TIME  . Melanoma Daughter 92       METASTATIC  . Breast cancer Sister   . Hypertension Father   . Hypertension Sister     Current Outpatient Medications:  .  Accu-Chek FastClix Lancets MISC, USE   TO CHECK GLUCOSE TWICE DAILY, Disp: 102 each, Rfl: 3 .  ACCU-CHEK GUIDE test strip, Use to test blood glucose twice daily, Disp: 200 each, Rfl: 3 .  acetaminophen (TYLENOL) 325 MG tablet, Take 650 mg by mouth every 6 (six) hours as needed.  , Disp: , Rfl:  .  aspirin EC 81 MG tablet, Take 1 tablet (81 mg total) by mouth daily., Disp: 90 tablet,  Rfl: 3 .  atorvastatin (LIPITOR) 80 MG tablet, Take 1 tablet (80 mg total) by mouth daily., Disp: 90 tablet, Rfl: 3 .  calcium carbonate (OS-CAL) 600 MG TABS, Take 1,200 mg by mouth daily. , Disp: , Rfl:  .  Cholecalciferol (VITAMIN D) 2000 UNITS CAPS, Take 1 capsule by mouth daily.  , Disp: , Rfl:  .  conjugated estrogens (PREMARIN) vaginal cream, Place 0.5 Applicatorfuls vaginally every 7 (seven) days., Disp: 42.5 g, Rfl: 6 .  denosumab (PROLIA) 60 MG/ML SOSY injection, Inject 60 mg into the skin every 6 (six) months., Disp: , Rfl:  .  diltiazem (CARDIZEM CD) 180 MG 24 hr capsule, Take 1 capsule (180 mg total) by mouth daily., Disp: 90 capsule, Rfl: 3 .  fluticasone (FLOVENT HFA) 44 MCG/ACT inhaler, Inhale 2 puffs into the lungs 2 (two) times daily., Disp: 1 Inhaler, Rfl: 3 .   furosemide (LASIX) 20 MG tablet, Take 1 tablet (20 mg total) by mouth 2 (two) times daily., Disp: 180 tablet, Rfl: 3 .  glipiZIDE (GLUCOTROL XL) 2.5 MG 24 hr tablet, TAKE 1 TABLET BY MOUTH  DAILY WITH BREAKFAST, Disp: 90 tablet, Rfl: 0 .  INVOKANA 100 MG TABS tablet, TAKE 1 TABLET BY MOUTH  DAILY BEFORE BREAKFAST, Disp: 90 tablet, Rfl: 0 .  levothyroxine (SYNTHROID) 50 MCG tablet, TAKE 1 TABLET BY MOUTH  DAILY BEFORE BREAKFAST, Disp: 90 tablet, Rfl: 3 .  Naphazoline-Polyethyl Glycol (EYE DROPS) 0.012-0.2 % SOLN, Apply to eye as needed. Artifical eye drops, Disp: , Rfl:  .  nitroGLYCERIN (NITROSTAT) 0.4 MG SL tablet, DISSOLVE 1 TABLET UNDER THE TONGUE EVERY 5 MINUTES AS  NEEDED FOR CHEST PAIN. MAX  3 DOSES IN 15 MINUTES. CALL 911 IF CHEST PAIN PERSISTS, Disp: 100 tablet, Rfl: 6 .  Omega-3 Fatty Acids (FISH OIL) 1200 MG CAPS, Take by mouth.  , Disp: , Rfl:  .  OVER THE COUNTER MEDICATION, CBD OIL - SHOULDER PAIN, Disp: , Rfl:  .  potassium chloride SA (K-DUR) 20 MEQ tablet, Take 1 tablet (20 mEq total) by mouth 2 (two) times daily., Disp: 180 tablet, Rfl: 3 .  PROAIR RESPICLICK 123XX123 (90 Base) MCG/ACT AEPB, Inhale 2 puffs into the lungs every 6 (six) hours as needed (for SOB or Wheezing)., Disp: 3 each, Rfl: 0 .  traZODone (DESYREL) 50 MG tablet, TAKE 1/2 TO 1 TABLET BY  MOUTH AT BEDTIME AS NEEDED  FOR SLEEP, Disp: 90 tablet, Rfl: 0  EXAM:  VITALS per patient if applicable:  GENERAL: alert, oriented, appears well and in no acute distress  HEENT: atraumatic, conjunttiva clear, no obvious abnormalities on inspection of external nose and ears  NECK: normal movements of the head and neck  LUNGS: on inspection no signs of respiratory distress, breathing rate appears normal, no obvious gross SOB, gasping or wheezing  CV: no obvious cyanosis  MS: moves all visible extremities without noticeable abnormality  PSYCH/NEURO: pleasant and cooperative, no obvious depression or anxiety, speech and thought  processing grossly intact  ASSESSMENT AND PLAN:  Discussed the following assessment and plan:  1. Hypoglycemia -We will have her switch from morning dosing to lunchtime dosing of her Invokana.  Follow-up in 3 weeks via virtual visit her MyChart hypoglycemia continues   I discussed the assessment and treatment plan with the patient. The patient was provided an opportunity to ask questions and all were answered. The patient agreed with the plan and demonstrated an understanding of the instructions.   The patient was advised to  call back or seek an in-person evaluation if the symptoms worsen or if the condition fails to improve as anticipated.   [name], NP

## 2019-08-06 DIAGNOSIS — M25551 Pain in right hip: Secondary | ICD-10-CM | POA: Diagnosis not present

## 2019-08-13 ENCOUNTER — Other Ambulatory Visit: Payer: Self-pay

## 2019-08-13 DIAGNOSIS — E119 Type 2 diabetes mellitus without complications: Secondary | ICD-10-CM

## 2019-08-13 MED ORDER — ACCU-CHEK GUIDE VI STRP: ORAL_STRIP | 3 refills | Status: DC

## 2019-08-19 ENCOUNTER — Other Ambulatory Visit: Payer: Self-pay | Admitting: Adult Health

## 2019-08-19 DIAGNOSIS — Z1231 Encounter for screening mammogram for malignant neoplasm of breast: Secondary | ICD-10-CM

## 2019-08-23 ENCOUNTER — Other Ambulatory Visit: Payer: Self-pay | Admitting: Adult Health

## 2019-08-23 DIAGNOSIS — E119 Type 2 diabetes mellitus without complications: Secondary | ICD-10-CM

## 2019-09-09 ENCOUNTER — Other Ambulatory Visit: Payer: Self-pay | Admitting: Adult Health

## 2019-09-09 DIAGNOSIS — E782 Mixed hyperlipidemia: Secondary | ICD-10-CM

## 2019-09-09 DIAGNOSIS — I2510 Atherosclerotic heart disease of native coronary artery without angina pectoris: Secondary | ICD-10-CM

## 2019-09-18 ENCOUNTER — Encounter: Payer: Self-pay | Admitting: Adult Health

## 2019-09-18 DIAGNOSIS — M7061 Trochanteric bursitis, right hip: Secondary | ICD-10-CM | POA: Diagnosis not present

## 2019-09-19 ENCOUNTER — Other Ambulatory Visit: Payer: Self-pay | Admitting: Adult Health

## 2019-09-19 MED ORDER — JARDIANCE 10 MG PO TABS: 10.0000 mg | ORAL_TABLET | Freq: Every day | ORAL | 0 refills | Status: DC

## 2019-09-29 ENCOUNTER — Ambulatory Visit
Admission: RE | Admit: 2019-09-29 | Discharge: 2019-09-29 | Disposition: A | Discharge status: Home / Self Care | Payer: Medicare Other | Source: Ambulatory Visit | Attending: Adult Health | Admitting: Adult Health

## 2019-09-29 ENCOUNTER — Other Ambulatory Visit: Payer: Self-pay

## 2019-09-29 DIAGNOSIS — Z1231 Encounter for screening mammogram for malignant neoplasm of breast: Secondary | ICD-10-CM | POA: Diagnosis not present

## 2019-10-03 DIAGNOSIS — M1611 Unilateral primary osteoarthritis, right hip: Secondary | ICD-10-CM | POA: Diagnosis not present

## 2019-10-03 DIAGNOSIS — M25551 Pain in right hip: Secondary | ICD-10-CM | POA: Diagnosis not present

## 2019-10-14 ENCOUNTER — Telehealth: Payer: Self-pay | Admitting: *Deleted

## 2019-10-14 NOTE — Telephone Encounter (Signed)
   [facility] Pre-operative Risk Assessment    Request for surgical clearance:  1. What type of surgery is being performed? RIGHT TOTAL HIP ARTHROPLASTY   2. When is this surgery scheduled? 11/19/19   3. What type of clearance is required (medical clearance vs. Pharmacy clearance to hold med vs. Both)? MEDICAL  4. Are there any medications that need to be held prior to surgery and how long? ASA    5. Practice name and name of physician performing surgery? [facility]; DR. FRANK ALUISIO   6. What is your office phone number [phone]    7.   What is your office fax number [phone] ATTN: KELLY HANCOCK  8.   Anesthesia type (None, local, MAC, general) ? CHOICE   Julaine Hua 10/14/2019, 3:49 PM  _________________________________________________________________   (provider comments below)

## 2019-10-14 NOTE — Telephone Encounter (Signed)
   Primary Cardiologist: Ena Dawley, MD  Chart reviewed as part of pre-operative protocol coverage. Given past medical history and time since last visit, based on ACC/AHA guidelines, Rebecca [name] would be at acceptable risk for the planned procedure without further cardiovascular testing.   Seen last in 08/2018 and was complaining of mild palpitations. Cardiac monitor revealed a brief episode of PSVT. She can continue ASA perioperatively. Can proceed with surgery.    I will route this recommendation to the requesting party via Epic fax function and remove from pre-op pool.  Please call with questions.  Phill Myron. [place] Pugh, ANP, AACC  10/14/2019, 3:58 PM

## 2019-10-15 ENCOUNTER — Telehealth: Payer: Self-pay | Admitting: Family Medicine

## 2019-10-15 NOTE — Telephone Encounter (Signed)
Received preoperative risk evaluation form from Dr. Maureen Ralphs for total right hip arthroplasty.  Surgery has been scheduled for 11/19/19.  Pt needs to be scheduled for surgery clearance.  Left a message for a return call.

## 2019-10-16 NOTE — Telephone Encounter (Signed)
Pt scheduled for 11/05/19 for clearance.  Nothing further needed.

## 2019-10-22 NOTE — Progress Notes (Signed)
Cardiology Office Note    Date:  10/29/2019   ID:  Rebecca [name], Rebecca [name] 01-Apr-1946, MRN [id number]  PCP:  Rebecca Peng, NP  Cardiologist: Ena Dawley, MD EPS: None  Chief Complaint  Patient presents with  . Follow-up    History of Present Illness:  Rebecca [name] is a 74 y.o. 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  Also has hyperlipidemia, HTN, chronic diastolic CHF with lower extremity edema controlled with low-dose Lasix.  Last saw Dr. [name] 01/2018 at which time she was cleared for shoulder surgery.   I last saw the patient 08/28/18 and was complaining of feeling jittery inside.  2-week monitor showed sinus bradycardia down to 44 bpm to sinus tachycardia with infrequent PACs, PVCs and few very short runs of SVT.  Could not increase medication because of bradycardia.  2D echo 09/04/2018 normal LVEF 55 to 60% with some DD.   Patient here for surgical clearance for total hip replacement by Dr. Wynelle Link. Patient says her HR fluctuates from 50-120/m and long runs of PVC's-several minutes-doesn't get dizzy or pass out. No excessive caffeine. Denies any chest pain. Has some swelling in legs. Not active because of hip pain. Needs cartilage repair as well. Was walking 1 1/2 miles daily up until 3 months ago. Has been vaccinated for covid19.Moving to [place] in 2 months.  Past Medical History:  Diagnosis Date  . Asthma    extrinsic  . CAD (coronary artery disease)   . Chest pain, atypical   . Diabetes ([place])   . Edema   . Fatigue   . Fatigue   . GERD (gastroesophageal reflux disease)   . Hyperlipidemia   . Hypertension    familiar  . Hypothyroidism   . IBS (irritable bowel syndrome)    hx  . Keratosis, seborrheic   . Labyrinthitis   . MI (myocardial infarction) ([place])    age 75  . Osteoarthritis   . S/P arthroscopy of right shoulder  03/01/2018  . Skin cancer    Melanoma     Past Surgical History:  Procedure Laterality Date  . Breast Biopsy     dense breast tissue  . BREAST EXCISIONAL BIOPSY Left 2000   benign  . CHOLECYSTECTOMY    . EUS  04/04/2012   Procedure: UPPER ENDOSCOPIC ULTRASOUND (EUS) LINEAR;  Surgeon: Milus Banister, MD;  Location: [facility];  Service: Endoscopy;  Laterality: N/A;  radial linear  . KNEE ARTHROPLASTY     total right  02-28-2010 Dr Pilar Plate Aluisio, left  . KNEE ARTHROPLASTY Bilateral 09/12/2017   [facility]  . LEFT HEART CATH AND CORONARY ANGIOGRAPHY N/A 04/20/2017   Procedure: LEFT HEART CATH AND CORONARY ANGIOGRAPHY;  Surgeon: Burnell Blanks, MD;  Location: [facility];  Service: Cardiovascular;  Laterality: N/A;  . TUBAL LIGATION      Current Medications: Current Meds  Medication Sig  . Accu-Chek FastClix Lancets MISC USE   TO CHECK GLUCOSE TWICE DAILY  . ACCU-CHEK GUIDE test strip Use to test blood glucose twice daily  . acetaminophen (TYLENOL) 325 MG tablet Take 650 mg by mouth every 6 (six) hours as needed.    Marland Kitchen aspirin EC 81 MG tablet Take 1 tablet (81 mg total) by mouth daily.  Marland Kitchen atorvastatin (LIPITOR) 80 MG tablet TAKE 1 TABLET BY MOUTH  DAILY  . calcium carbonate (OS-CAL) 600  MG TABS Take 1,200 mg by mouth daily.   . Cholecalciferol (VITAMIN D) 2000 UNITS CAPS Take 1 capsule by mouth daily.    Marland Kitchen conjugated estrogens (PREMARIN) vaginal cream Place 0.5 Applicatorfuls vaginally every 7 (seven) days.  Marland Kitchen denosumab (PROLIA) 60 MG/ML SOSY injection Inject 60 mg into the skin every 6 (six) months.  . diltiazem (CARDIZEM CD) 240 MG 24 hr capsule Take 1 capsule (240 mg total) by mouth daily.  . empagliflozin (JARDIANCE) 10 MG TABS tablet Take 10 mg by mouth daily before breakfast.  . fluticasone (FLOVENT HFA) 44 MCG/ACT inhaler Inhale 2 puffs into the lungs 2 (two) times daily.  . furosemide (LASIX) 20 MG tablet Take 1 tablet (20 mg total) by mouth 2 (two) times  daily.  Marland Kitchen glipiZIDE (GLUCOTROL XL) 2.5 MG 24 hr tablet TAKE 1 TABLET BY MOUTH  DAILY WITH BREAKFAST  . levothyroxine (SYNTHROID) 50 MCG tablet TAKE 1 TABLET BY MOUTH  DAILY BEFORE BREAKFAST  . Naphazoline-Polyethyl Glycol (EYE DROPS) 0.012-0.2 % SOLN Apply to eye as needed. Artifical eye drops  . nitroGLYCERIN (NITROSTAT) 0.4 MG SL tablet DISSOLVE 1 TABLET UNDER THE TONGUE EVERY 5 MINUTES AS  NEEDED FOR CHEST PAIN. MAX  3 DOSES IN 15 MINUTES. CALL 911 IF CHEST PAIN PERSISTS  . Omega-3 Fatty Acids (FISH OIL) 1200 MG CAPS Take by mouth.    Marland Kitchen OVER THE COUNTER MEDICATION CBD OIL - SHOULDER PAIN  . potassium chloride SA (K-DUR) 20 MEQ tablet Take 1 tablet (20 mEq total) by mouth 2 (two) times daily.  Marland Kitchen PROAIR RESPICLICK 123XX123 (90 Base) MCG/ACT AEPB Inhale 2 puffs into the lungs every 6 (six) hours as needed (for SOB or Wheezing).  . traZODone (DESYREL) 50 MG tablet TAKE 1/2 TO 1 TABLET BY  MOUTH AT BEDTIME AS NEEDED  FOR SLEEP  . [DISCONTINUED] diltiazem (CARDIZEM CD) 180 MG 24 hr capsule Take 1 capsule (180 mg total) by mouth daily.     Allergies:   Sulfa antibiotics and Sulfonamide derivatives   Social History   Socioeconomic History  . Marital status: Married    Spouse name: Not on file  . Number of children: 2  . Years of education: Not on file  . Highest education level: Not on file  Occupational History  . Occupation: RN-retired    Employer: RETIRED  Tobacco Use  . Smoking status: Never Smoker  . Smokeless tobacco: Never Used  Substance and Sexual Activity  . Alcohol use: No    Comment: rarely  . Drug use: No  . Sexual activity: Not on file  Other Topics Concern  . Not on file  Social History Narrative   ** Merged History Encounter **       Social Determinants of Health   Financial Resource Strain:   . Difficulty of Paying Living Expenses:   Food Insecurity:   . Worried About Charity fundraiser in the Last Year:   . Arboriculturist in the Last Year:   Transportation  Needs:   . Film/video editor (Medical):   Marland Kitchen Lack of Transportation (Non-Medical):   Physical Activity:   . Days of Exercise per Week:   . Minutes of Exercise per Session:   Stress:   . Feeling of Stress :   Social Connections:   . Frequency of Communication with Friends and Family:   . Frequency of Social Gatherings with Friends and Family:   . Attends Religious Services:   . Active Member of Clubs  or Organizations:   . Attends Archivist Meetings:   Marland Kitchen Marital Status:      Family History:  The patient's family history includes Breast cancer in her sister; Heart attack (age of onset: 31) in her mother; Heart disease in her mother; Hypertension in her father, mother, and sister; Melanoma (age of onset: 54) in her daughter.   ROS:   Please see the history of present illness.    ROS All other systems reviewed and are negative.   PHYSICAL EXAM:   VS:  BP 122/60   Pulse 72   Ht 4\' 10"  (1.473 m)   Wt 141 lb 12.8 oz (64.3 kg)   SpO2 99%   BMI 29.64 kg/m   Physical Exam  GEN: Well nourished, well developed, in no acute distress  Neck: no JVD, carotid bruits, or masses Cardiac:RRR; no murmurs, rubs, or gallops  Respiratory:  clear to auscultation bilaterally, normal work of breathing GI: soft, nontender, nondistended, + BS Ext: trace of edema otherwise without cyanosis, clubbing,  Good distal pulses bilaterally Neuro:  Alert and Oriented x 3 Psych: euthymic mood, full affect  Wt Readings from Last 3 Encounters:  10/29/19 141 lb 12.8 oz (64.3 kg)  11/03/18 140 lb (63.5 kg)  08/28/18 137 lb (62.1 kg)      Studies/Labs Reviewed:   EKG:  EKG is  ordered today.  The ekg ordered today demonstrates NSR with inf Q waves and T wave inversion unchanged from prior tracings.  Recent Labs: 11/03/2018: ALT 60; BUN 16; Creatinine, Ser 0.98; Hemoglobin 15.3; Platelets 168; Potassium 3.8; Sodium 129   Lipid Panel    Component Value Date/Time   CHOL 201 (H) 11/09/2017  0952   TRIG 125.0 11/09/2017 0952   HDL 50.70 11/09/2017 0952   CHOLHDL 4 11/09/2017 0952   VLDL 25.0 11/09/2017 0952   LDLCALC 126 (H) 11/09/2017 0952    Additional studies/ records that were reviewed today include:  2D echo 09/04/2018 IMPRESSIONS     1. The left ventricle has normal systolic function, with an ejection  fraction of 55-60%. The cavity size was normal. There is mildly increased  left ventricular wall thickness. Left ventricular diastolic Doppler  parameters are consistent with  pseudonormalization No evidence of left ventricular regional wall motion  abnormalities.   2. The right ventricle has normal systolic function. The cavity was  normal. There is no increase in right ventricular wall thickness.   3. The mitral valve is normal in structure. No evidence of mitral valve  stenosis. Trivial regurgitation.   4. The tricuspid valve is normal in structure.   5. The aortic valve is tricuspid no stenosis of the aortic valve.   6. The pulmonic valve was normal in structure.   7. The aortic root and ascending aorta are normal in size and structure.   8. IVC normal in size, PA systolic pressure 26 mmHg.   Holter monitor 09/04/2018  Sinus bradycardia to sinus tachycardia.  Infrequent PACs, PVCs. Few very short SVT runs.   Sinus bradycardia to sinus tachycardia. Infrequent PACs, PVCs. Few very short SVT runs.         ASSESSMENT:    1. Preoperative cardiovascular examination   2. Coronary artery disease involving native coronary artery of native heart without angina pectoris   3. Palpitations   4. Chronic diastolic CHF (congestive heart failure) ([place])   5. Essential hypertension   6. Hyperlipidemia, unspecified hyperlipidemia type   7. Atherosclerosis of native coronary artery of  native heart without angina pectoris      PLAN:  In order of problems listed above:  Preoperative clearance before undergoing hip surgery by Dr. Maureen Ralphs 11/19/19. Patient with history  of CAD, last cath nonobstructive disease, no angina. Does have SVT and palpitations I'm increasing cardizem for.  Cardiac risk index 6.6 but mets 5.29. with no angina can proceed with hip surgery without further cardiac work up. According to the Revised Cardiac Risk Index (RCRI), her Perioperative Risk of Major Cardiac Event is (%): 6.6  Her Functional Capacity in METs is: 5.29 according to the [facility] Activity Status Index (DASI).    CAD status post PCI to the RCA in 2010, last cath 04/2017 nonobstructive CAD with normal LVEF medical management recommended-no angina  History of palpitations 2-week monitor 09/04/2018 sinus bradycardia to sinus tachycardia with infrequent PACs, PVCs and very short runs of SVT.Increase in palpitations recently- increase diltiazem 240 mg daily and she'll keep track of pulse at home and let us know how she tolerates it.  Chronic diastolic CHF 2D echo [id number] normal LVEF with diastolic dysfunction-overall compensated on low dose lasix  Hypertension controlled  Hyperlipidemia on lipitor-due for FLP-having blood work by PCP next week and she'll ask them to draw.    Medication Adjustments/Labs and Tests Ordered: Current medicines are reviewed at length with the patient today.  Concerns regarding medicines are outlined above.  Medication changes, Labs and Tests ordered today are listed in the Patient Instructions below. Patient Instructions  Medication Instructions:  Your physician has recommended you make the following change in your medication:   INCREASE: diltiazem to 240 mg once a day  *If you need a refill on your cardiac medications before your next appointment, please call your pharmacy*   Lab Work: None ordered  If you have labs (blood work) drawn today and your tests are completely normal, you will receive your results only by: Marland Kitchen MyChart Message (if you have MyChart) OR . A paper copy in the mail If you have any lab test that is abnormal or we need to  change your treatment, we will call you to review the results.   Testing/Procedures: None ordered   Follow-Up: At [facility], you and your health needs are our priority.  As part of our continuing mission to provide you with exceptional heart care, we have created designated Provider Care Teams.  These Care Teams include your primary Cardiologist (physician) and Advanced Practice Providers (APPs -  Physician Assistants and Nurse Practitioners) who all work together to provide you with the care you need, when you need it.  We recommend signing up for the patient portal called "MyChart".  Sign up information is provided on this After Visit Summary.  MyChart is used to connect with patients for Virtual Visits (Telemedicine).  Patients are able to view lab/test results, encounter notes, upcoming appointments, etc.  Non-urgent messages can be sent to your provider as well.   To learn more about what you can do with MyChart, go to [email or web address].    Your next appointment:   12 month(s)  The format for your next appointment:   In Person  Provider:   You may see Ena Dawley, MD or one of the following Advanced Practice Providers on your designated Care Team:    Melina Copa, PA-C  Ermalinda Barrios, PA-C    Other Instructions      Signed, Ermalinda Barrios, PA-C  10/29/2019 10:15 AM    [place]  [address], [place], [place]  [address] Phone: [phone]; Fax: [phone]

## 2019-10-29 ENCOUNTER — Other Ambulatory Visit: Payer: Self-pay

## 2019-10-29 ENCOUNTER — Encounter: Payer: Self-pay | Admitting: Adult Health

## 2019-10-29 ENCOUNTER — Ambulatory Visit (INDEPENDENT_AMBULATORY_CARE_PROVIDER_SITE_OTHER): Payer: Medicare Other | Admitting: Physician Assistant

## 2019-10-29 ENCOUNTER — Encounter: Payer: Self-pay | Admitting: Physician Assistant

## 2019-10-29 ENCOUNTER — Other Ambulatory Visit: Payer: Self-pay | Admitting: Physician Assistant

## 2019-10-29 VITALS — BP 122/60 | HR 72 | Ht <= 58 in | Wt 141.8 lb

## 2019-10-29 DIAGNOSIS — I2510 Atherosclerotic heart disease of native coronary artery without angina pectoris: Secondary | ICD-10-CM

## 2019-10-29 DIAGNOSIS — R002 Palpitations: Secondary | ICD-10-CM | POA: Diagnosis not present

## 2019-10-29 DIAGNOSIS — Z01810 Encounter for preprocedural cardiovascular examination: Secondary | ICD-10-CM | POA: Diagnosis not present

## 2019-10-29 DIAGNOSIS — I5032 Chronic diastolic (congestive) heart failure: Secondary | ICD-10-CM

## 2019-10-29 DIAGNOSIS — I10 Essential (primary) hypertension: Secondary | ICD-10-CM

## 2019-10-29 DIAGNOSIS — E785 Hyperlipidemia, unspecified: Secondary | ICD-10-CM | POA: Diagnosis not present

## 2019-10-29 MED ORDER — DILTIAZEM HCL ER COATED BEADS 240 MG PO CP24: 240.0000 mg | ORAL_CAPSULE | Freq: Every day | ORAL | 0 refills | Status: DC

## 2019-10-29 NOTE — Patient Instructions (Signed)
Medication Instructions:  Your physician has recommended you make the following change in your medication:   INCREASE: diltiazem to 240 mg once a day  *If you need a refill on your cardiac medications before your next appointment, please call your pharmacy*   Lab Work: None ordered  If you have labs (blood work) drawn today and your tests are completely normal, you will receive your results only by: Marland Kitchen MyChart Message (if you have MyChart) OR . A paper copy in the mail If you have any lab test that is abnormal or we need to change your treatment, we will call you to review the results.   Testing/Procedures: None ordered   Follow-Up: At [facility], you and your health needs are our priority.  As part of our continuing mission to provide you with exceptional heart care, we have created designated Provider Care Teams.  These Care Teams include your primary Cardiologist (physician) and Advanced Practice Providers (APPs -  Physician Assistants and Nurse Practitioners) who all work together to provide you with the care you need, when you need it.  We recommend signing up for the patient portal called "MyChart".  Sign up information is provided on this After Visit Summary.  MyChart is used to connect with patients for Virtual Visits (Telemedicine).  Patients are able to view lab/test results, encounter notes, upcoming appointments, etc.  Non-urgent messages can be sent to your provider as well.   To learn more about what you can do with MyChart, go to [email or web address].    Your next appointment:   12 month(s)  The format for your next appointment:   In Person  Provider:   You may see Ena Dawley, MD or one of the following Advanced Practice Providers on your designated Care Team:    Melina Copa, PA-C  Ermalinda Barrios, PA-C    Other Instructions

## 2019-11-04 ENCOUNTER — Other Ambulatory Visit: Payer: Self-pay

## 2019-11-05 ENCOUNTER — Ambulatory Visit (INDEPENDENT_AMBULATORY_CARE_PROVIDER_SITE_OTHER): Payer: Medicare Other | Admitting: Adult Health

## 2019-11-05 ENCOUNTER — Encounter: Payer: Self-pay | Admitting: Adult Health

## 2019-11-05 VITALS — BP 124/60 | HR 67 | Temp 97.6°F | Wt 139.6 lb

## 2019-11-05 DIAGNOSIS — E782 Mixed hyperlipidemia: Secondary | ICD-10-CM | POA: Diagnosis not present

## 2019-11-05 DIAGNOSIS — E119 Type 2 diabetes mellitus without complications: Secondary | ICD-10-CM

## 2019-11-05 DIAGNOSIS — E039 Hypothyroidism, unspecified: Secondary | ICD-10-CM

## 2019-11-05 DIAGNOSIS — I2510 Atherosclerotic heart disease of native coronary artery without angina pectoris: Secondary | ICD-10-CM

## 2019-11-05 DIAGNOSIS — M25551 Pain in right hip: Secondary | ICD-10-CM | POA: Diagnosis not present

## 2019-11-05 DIAGNOSIS — Z01818 Encounter for other preprocedural examination: Secondary | ICD-10-CM

## 2019-11-05 LAB — COMPREHENSIVE METABOLIC PANEL
ALT: 24 U/L (ref 0–35)
AST: 18 U/L (ref 0–37)
Albumin: 4.7 g/dL (ref 3.5–5.2)
Alkaline Phosphatase: 61 U/L (ref 39–117)
BUN: 22 mg/dL (ref 6–23)
CO2: 26 mEq/L (ref 19–32)
Calcium: 9.8 mg/dL (ref 8.4–10.5)
Chloride: 97 mEq/L (ref 96–112)
Creatinine, Ser: 0.81 mg/dL (ref 0.40–1.20)
GFR: 68.83 mL/min (ref >60.00)
Glucose, Bld: 114 mg/dL — ABNORMAL HIGH (ref 70–99)
Potassium: 4.6 mEq/L (ref 3.5–5.1)
Sodium: 133 mEq/L — ABNORMAL LOW (ref 135–145)
Total Bilirubin: 0.6 mg/dL (ref 0.2–1.2)
Total Protein: 6.7 g/dL (ref 6.0–8.3)

## 2019-11-05 LAB — TSH: TSH: 1.56 u[IU]/mL (ref 0.35–4.50)

## 2019-11-05 LAB — URINALYSIS
Bilirubin Urine: NEGATIVE
Hgb urine dipstick: NEGATIVE
Ketones, ur: 15 — AB
Leukocytes,Ua: NEGATIVE
Nitrite: NEGATIVE
Specific Gravity, Urine: 1.01 (ref 1.000–1.030)
Total Protein, Urine: NEGATIVE
Urine Glucose: >=1000 — AB
Urobilinogen, UA: 0.2 (ref 0.0–1.0)
pH: 5.5 (ref 5.0–8.0)

## 2019-11-05 LAB — LIPID PANEL
Cholesterol: 167 mg/dL (ref 0–200)
HDL: 55 mg/dL (ref >39.00)
LDL Cholesterol: 87 mg/dL (ref 0–99)
NonHDL: 111.61
Total CHOL/HDL Ratio: 3
Triglycerides: 123 mg/dL (ref 0.0–149.0)
VLDL: 24.6 mg/dL (ref 0.0–40.0)

## 2019-11-05 LAB — HEMOGLOBIN A1C: Hgb A1c MFr Bld: 6.9 % — ABNORMAL HIGH (ref 4.6–6.5)

## 2019-11-05 MED ORDER — ALPRAZOLAM 0.5 MG PO TABS: 0.5000 mg | ORAL_TABLET | Freq: Two times a day (BID) | ORAL | 0 refills | Status: DC | PRN

## 2019-11-05 NOTE — Progress Notes (Signed)
Subjective:    Patient ID: Rebecca [name], female    DOB: Apr 18, 1946, 73 y.o.   MRN: [id number]  HPI  74 year old female who  has a past medical history of Asthma, CAD (coronary artery disease), Chest pain, atypical, Diabetes ([place]), Edema, Fatigue, Fatigue, GERD (gastroesophageal reflux disease), Hyperlipidemia, Hypertension, Hypothyroidism, IBS (irritable bowel syndrome), Keratosis, seborrheic, Labyrinthitis, MI (myocardial infarction) ([place]), Osteoarthritis, S/P arthroscopy of right shoulder (03/01/2018), and Skin cancer.  She presents to the office today for surgical clearance.  She is scheduled to have a right total hip arthoplasty in 1 week.  The surgery will be done by Dr. Wynelle Link.  She reports that the surgery was moved up due to worsening pain in her right hip.  She reports that she is unable to sleep due to pain she may be getting 2 to 3 hours a night.  She does have oxycodone at home but she does not like to take it.  She has been seen by cardiology and cleared from their perspective.  Additionally, she and her husband are going to be moving into [facility] dependent living facility and she received a call earlier today that they had an apartment open up so she and her husband are going to move in there in early June.  They were trying to wait until 2023 or so before moving to an independent living facility but when this opportunity opened up felt like now is the time to move.  She needs documentation filled out for her independent living facility as well.  She is due for diabetes check.  She is currently on Jardiance 10 mg daily.  She does monitor her blood sugars at home and reports readings below 140, denies any lows.  Her diet consists of mostly low-carb which she has for breakfast and then eats proteins throughout the day.  He has not been able to exercise much due to right hip pain.  Will also check Lipid and TSH today      Review of Systems  Constitutional: Negative.   HENT:  Negative.   Eyes: Negative.   Respiratory: Negative.   Cardiovascular: Negative.   Gastrointestinal: Negative.   Endocrine: Negative.   Genitourinary: Negative.   Musculoskeletal: Positive for arthralgias (Right Hip ).  Skin: Negative.   Allergic/Immunologic: Negative.   Neurological: Negative.   Hematological: Negative.   Psychiatric/Behavioral: Negative.    Past Medical History:  Diagnosis Date  . Asthma    extrinsic  . CAD (coronary artery disease)   . Chest pain, atypical   . Diabetes ([place])   . Edema   . Fatigue   . Fatigue   . GERD (gastroesophageal reflux disease)   . Hyperlipidemia   . Hypertension    familiar  . Hypothyroidism   . IBS (irritable bowel syndrome)    hx  . Keratosis, seborrheic   . Labyrinthitis   . MI (myocardial infarction) ([place])    age 51  . Osteoarthritis   . S/P arthroscopy of right shoulder 03/01/2018  . Skin cancer    Melanoma     Social History   Socioeconomic History  . Marital status: Married    Spouse name: Not on file  . Number of children: 2  . Years of education: Not on file  . Highest education level: Not on file  Occupational History  . Occupation: RN-retired    Employer: RETIRED  Tobacco Use  . Smoking status: Never Smoker  . Smokeless tobacco: Never Used  Substance and  Sexual Activity  . Alcohol use: No    Comment: rarely  . Drug use: No  . Sexual activity: Not on file  Other Topics Concern  . Not on file  Social History Narrative   ** Merged History Encounter **       Social Determinants of Health   Financial Resource Strain:   . Difficulty of Paying Living Expenses:   Food Insecurity:   . Worried About Charity fundraiser in the Last Year:   . Arboriculturist in the Last Year:   Transportation Needs:   . Film/video editor (Medical):   Marland Kitchen Lack of Transportation (Non-Medical):   Physical Activity:   . Days of Exercise per Week:   . Minutes of Exercise per Session:   Stress:   . Feeling of  Stress :   Social Connections:   . Frequency of Communication with Friends and Family:   . Frequency of Social Gatherings with Friends and Family:   . Attends Religious Services:   . Active Member of Clubs or Organizations:   . Attends Archivist Meetings:   Marland Kitchen Marital Status:   Intimate Partner Violence:   . Fear of Current or Ex-Partner:   . Emotionally Abused:   Marland Kitchen Physically Abused:   . Sexually Abused:     Past Surgical History:  Procedure Laterality Date  . Breast Biopsy     dense breast tissue  . BREAST EXCISIONAL BIOPSY Left 2000   benign  . CHOLECYSTECTOMY    . EUS  04/04/2012   Procedure: UPPER ENDOSCOPIC ULTRASOUND (EUS) LINEAR;  Surgeon: Milus Banister, MD;  Location: [facility];  Service: Endoscopy;  Laterality: N/A;  radial linear  . KNEE ARTHROPLASTY     total right  02-28-2010 Dr Pilar Plate Aluisio, left  . KNEE ARTHROPLASTY Bilateral 09/12/2017   Hector Shade  . LEFT HEART CATH AND CORONARY ANGIOGRAPHY N/A 04/20/2017   Procedure: LEFT HEART CATH AND CORONARY ANGIOGRAPHY;  Surgeon: Burnell Blanks, MD;  Location: [facility];  Service: Cardiovascular;  Laterality: N/A;  . TUBAL LIGATION      Family History  Problem Relation Age of Onset  . Heart disease Mother   . Hypertension Mother   . Heart attack Mother 69       MULTIPLES OVER TIME  . Melanoma Daughter 74       METASTATIC  . Breast cancer Sister   . Hypertension Father   . Hypertension Sister     Allergies  Allergen Reactions  . Sulfa Antibiotics   . Sulfonamide Derivatives     REACTION: Kathreen Cosier syndrome    Current Outpatient Medications on File Prior to Visit  Medication Sig Dispense Refill  . Accu-Chek FastClix Lancets MISC USE   TO CHECK GLUCOSE TWICE DAILY 102 each 3  . ACCU-CHEK GUIDE test strip Use to test blood glucose twice daily 200 each 3  . acetaminophen (TYLENOL) 325 MG tablet Take 650 mg by mouth every 6 (six) hours as needed for moderate pain.     Marland Kitchen  aspirin EC 81 MG tablet Take 1 tablet (81 mg total) by mouth daily. 90 tablet 3  . atorvastatin (LIPITOR) 80 MG tablet TAKE 1 TABLET BY MOUTH  DAILY 90 tablet 3  . calcium carbonate (OS-CAL) 600 MG TABS Take 1,200 mg by mouth daily.     . Cholecalciferol (VITAMIN D) 2000 UNITS CAPS Take 1 capsule by mouth daily.      Marland Kitchen conjugated  estrogens (PREMARIN) vaginal cream Place 0.5 Applicatorfuls vaginally every 7 (seven) days. 42.5 g 6  . denosumab (PROLIA) 60 MG/ML SOSY injection Inject 60 mg into the skin every 6 (six) months.    . diltiazem (CARDIZEM CD) 240 MG 24 hr capsule TAKE 1 CAPSULE(240 MG) BY MOUTH DAILY (Patient taking differently: Take 240 mg by mouth daily. ) 90 capsule 3  . empagliflozin (JARDIANCE) 10 MG TABS tablet Take 10 mg by mouth daily before breakfast. 90 tablet 0  . fluticasone (FLOVENT HFA) 44 MCG/ACT inhaler Inhale 2 puffs into the lungs 2 (two) times daily. 1 Inhaler 3  . furosemide (LASIX) 20 MG tablet Take 1 tablet (20 mg total) by mouth 2 (two) times daily. (Patient taking differently: Take 40 mg by mouth daily. ) 180 tablet 3  . levothyroxine (SYNTHROID) 50 MCG tablet TAKE 1 TABLET BY MOUTH  DAILY BEFORE BREAKFAST 90 tablet 3  . Naphazoline-Polyethyl Glycol (EYE DROPS) 0.012-0.2 % SOLN Apply 1 drop to eye as needed (dry eyes). Artifical eye drops    . nitroGLYCERIN (NITROSTAT) 0.4 MG SL tablet DISSOLVE 1 TABLET UNDER THE TONGUE EVERY 5 MINUTES AS  NEEDED FOR CHEST PAIN. MAX  3 DOSES IN 15 MINUTES. CALL 911 IF CHEST PAIN PERSISTS (Patient taking differently: Place 0.4 mg under the tongue every 5 (five) minutes as needed for chest pain. ) 100 tablet 6  . Omega-3 Fatty Acids (FISH OIL) 1200 MG CAPS Take 1,200 mg by mouth daily.     . potassium chloride SA (K-DUR) 20 MEQ tablet Take 1 tablet (20 mEq total) by mouth 2 (two) times daily. (Patient taking differently: Take 20 mEq by mouth daily. ) 180 tablet 3  . PROAIR RESPICLICK 123XX123 (90 Base) MCG/ACT AEPB Inhale 2 puffs into the  lungs every 6 (six) hours as needed (for SOB or Wheezing). 3 each 0  . traZODone (DESYREL) 50 MG tablet TAKE 1/2 TO 1 TABLET BY  MOUTH AT BEDTIME AS NEEDED  FOR SLEEP (Patient taking differently: Take 25-50 mg by mouth at bedtime as needed for sleep. ) 90 tablet 0   No current facility-administered medications on file prior to visit.    BP 124/60 (BP Location: Left Arm, Patient Position: Sitting, Cuff Size: Normal)   Pulse 67   Temp 97.6 F (36.4 C) (Temporal)   Wt 139 lb 9.6 oz (63.3 kg)   SpO2 97%   BMI 29.18 kg/m       Objective:   Physical Exam Vitals and nursing note reviewed.  Constitutional:      General: She is not in acute distress.    Appearance: Normal appearance. She is well-developed. She is not ill-appearing.  HENT:     Head: Normocephalic and atraumatic.     Right Ear: Tympanic membrane, ear canal and external ear normal. There is no impacted cerumen.     Left Ear: Tympanic membrane, ear canal and external ear normal. There is no impacted cerumen.     Nose: Nose normal. No congestion or rhinorrhea.     Mouth/Throat:     Mouth: Mucous membranes are moist.     Pharynx: Oropharynx is clear. No oropharyngeal exudate or posterior oropharyngeal erythema.  Eyes:     General:        Right eye: No discharge.        Left eye: No discharge.     Extraocular Movements: Extraocular movements intact.     Conjunctiva/sclera: Conjunctivae normal.     Pupils: Pupils are equal, round,  and reactive to light.  Neck:     Thyroid: No thyromegaly.     Vascular: No carotid bruit.     Trachea: No tracheal deviation.  Cardiovascular:     Rate and Rhythm: Normal rate and regular rhythm.     Pulses: Normal pulses.     Heart sounds: Normal heart sounds. No murmur. No friction rub. No gallop.   Pulmonary:     Effort: Pulmonary effort is normal. No respiratory distress.     Breath sounds: Normal breath sounds. No stridor. No wheezing, rhonchi or rales.  Chest:     Chest wall: No  tenderness.  Abdominal:     General: Abdomen is flat. Bowel sounds are normal. There is no distension.     Palpations: Abdomen is soft. There is no mass.     Tenderness: There is no abdominal tenderness. There is no right CVA tenderness, left CVA tenderness, guarding or rebound.     Hernia: No hernia is present.  Musculoskeletal:        General: Tenderness (right hip ) present. No swelling, deformity or signs of injury. Normal range of motion.     Cervical back: Normal range of motion and neck supple.     Right lower leg: No edema.     Left lower leg: No edema.  Lymphadenopathy:     Cervical: No cervical adenopathy.  Skin:    General: Skin is warm and dry.     Coloration: Skin is not jaundiced or pale.     Findings: No bruising, erythema, lesion or rash.  Neurological:     General: No focal deficit present.     Mental Status: She is alert and oriented to person, place, and time.     Cranial Nerves: No cranial nerve deficit.     Sensory: No sensory deficit.     Motor: No weakness.     Coordination: Coordination normal.     Gait: Gait normal.     Deep Tendon Reflexes: Reflexes normal.  Psychiatric:        Mood and Affect: Mood normal.        Behavior: Behavior normal.        Thought Content: Thought content normal.        Judgment: Judgment normal.       Assessment & Plan:  1. Pre-operative clearance -Reviewed cardiology notes and EKG which was consistent with previous EKGs.  Will fill out presurgical paperwork and fax back to [facility].  We will also fill out paperwork for independent living facility - Hemoglobin A1c - Lipid panel - TSH - Urinalysis - CBC with Differential/Platelet - CMP  2. Hypothyroidism, unspecified type  - TSH  3. Diabetes mellitus without complication ([place])  - Hemoglobin A1c - Lipid panel  4. Mixed hyperlipidemia  - Lipid panel  5. Right hip pain -Provide a short course of Xanax to help with her right hip pain.  Hopefully this will  allow her to calm down and be able to sleep at night - ALPRAZolam (XANAX) 0.5 MG tablet; Take 1 tablet (0.5 mg total) by mouth 2 (two) times daily as needed for anxiety.  Dispense: 20 tablet; Refill: 0  [name], NP

## 2019-11-06 LAB — CBC WITH DIFFERENTIAL/PLATELET
Basophils Absolute: 0 10*3/uL (ref 0.0–0.1)
Basophils Relative: 0.5 % (ref 0.0–3.0)
Eosinophils Absolute: 0 10*3/uL (ref 0.0–0.7)
Eosinophils Relative: 0.8 % (ref 0.0–5.0)
HCT: 45.2 % (ref 36.0–46.0)
Hemoglobin: 14.9 g/dL (ref 12.0–15.0)
Lymphocytes Relative: 31.8 % (ref 12.0–46.0)
Lymphs Abs: 1.8 10*3/uL (ref 0.7–4.0)
MCHC: 32.9 g/dL (ref 30.0–36.0)
MCV: 96.1 fl (ref 78.0–100.0)
Monocytes Absolute: 0.3 10*3/uL (ref 0.1–1.0)
Monocytes Relative: 5.8 % (ref 3.0–12.0)
Neutro Abs: 3.5 10*3/uL (ref 1.4–7.7)
Neutrophils Relative %: 61.1 % (ref 43.0–77.0)
Platelets: 274 10*3/uL (ref 150.0–400.0)
RBC: 4.71 Mil/uL (ref 3.87–5.11)
RDW: 13.3 % (ref 11.5–15.5)
WBC: 5.8 10*3/uL (ref 4.0–10.5)

## 2019-11-07 NOTE — Patient Instructions (Addendum)
DUE TO COVID-19 ONLY ONE VISITOR IS ALLOWED TO COME WITH YOU AND STAY IN THE WAITING ROOM ONLY DURING PRE OP AND PROCEDURE DAY OF SURGERY. THE 1 VISITOR MAY VISIT WITH YOU AFTER SURGERY IN YOUR PRIVATE ROOM DURING VISITING HOURS ONLY!  YOU NEED TO HAVE A COVID 19 TEST ON 11-10-19 @ 2:50 PM, THIS TEST MUST BE DONE BEFORE SURGERY, COME  [place], [address] , [id number].  ([place]) ONCE YOUR COVID TEST IS COMPLETED, PLEASE BEGIN THE QUARANTINE INSTRUCTIONS AS OUTLINED IN YOUR HANDOUT.                Rebecca [name]  11/07/2019   Your procedure is scheduled on: 11-12-19   Report to [facility] Main  Entrance    Report to admitting at 6:40 AM     Call this number if you have problems the morning of surgery [phone]    Remember: AFTER MIDNIGHT THE NIGHT PRIOR TO SURGERY. NOTHING BY MOUTH EXCEPT CLEAR LIQUIDS UNTIL 6:10 AM . PLEASE FINISH G2 DRINK PER SURGEON ORDER  WHICH NEEDS TO BE COMPLETED AT 6:10 AM .   CLEAR LIQUID DIET   Foods Allowed                                                                     Foods Excluded  Coffee and tea, regular and decaf                             liquids that you cannot  Plain Jell-O any favor except red or purple                                           see through such as: Fruit ices (not with fruit pulp)                                     milk, soups, orange juice  Iced Popsicles                                    All solid food Carbonated beverages, regular and diet                                    Cranberry, grape and apple juices Sports drinks like Gatorade Lightly seasoned clear broth or consume(fat free) Sugar, honey syrup   _____________________________________________________________________     Take these medicines the morning of surgery with A SIP OF WATER: Atorvastatin (Lipitor), Cardizem (Diltiazem), and Levothyroxine (Synthroid)  BRUSH YOUR TEETH MORNING OF SURGERY AND RINSE YOUR MOUTH  OUT, NO CHEWING GUM CANDY OR MINTS.   DO NOT TAKE ANY DIABETIC MEDICATIONS DAY OF YOUR SURGERY  You may not have any metal on your body including hair pins and              piercings     Do not wear jewelry, make-up, lotions, powders or perfumes, deodorant              Do not wear nail polish on your fingernails.  Do not shave  48 hours prior to surgery.               Do not bring valuables to the hospital. [place].  Contacts, dentures or bridgework may not be worn into surgery.   You may bring a small overnight bag     Special Instructions: N/A              Please read over the following fact sheets you were given: _____________________________________________________________________  How to Manage Your Diabetes Before and After Surgery  Why is it important to control my blood sugar before and after surgery? . Improving blood sugar levels before and after surgery helps healing and can limit problems. . A way of improving blood sugar control is eating a healthy diet by: o  Eating less sugar and carbohydrates o  Increasing activity/exercise o  Talking with your doctor about reaching your blood sugar goals . High blood sugars (greater than 180 mg/dL) can raise your risk of infections and slow your recovery, so you will need to focus on controlling your diabetes during the weeks before surgery. . Make sure that the doctor who takes care of your diabetes knows about your planned surgery including the date and location.  How do I manage my blood sugar before surgery? . Check your blood sugar at least 4 times a day, starting 2 days before surgery, to make sure that the level is not too high or low. o Check your blood sugar the morning of your surgery when you wake up and every 2 hours until you get to the Short Stay unit. . If your blood sugar is less than 70 mg/dL, you will need to treat for low blood  sugar: o Do not take insulin. o Treat a low blood sugar (less than 70 mg/dL) with  cup of clear juice (cranberry or apple), 4 glucose tablets, OR glucose gel. o Recheck blood sugar in 15 minutes after treatment (to make sure it is greater than 70 mg/dL). If your blood sugar is not greater than 70 mg/dL on recheck, call [phone] for further instructions. . Report your blood sugar to the short stay nurse when you get to Short Stay.  . If you are admitted to the hospital after surgery: o Your blood sugar will be checked by the staff and you will probably be given insulin after surgery (instead of oral diabetes medicines) to make sure you have good blood sugar levels. o The goal for blood sugar control after surgery is 80-180 mg/dL.   WHAT DO I DO ABOUT MY DIABETES MEDICATION?  Marland Kitchen Do not take oral diabetes medicines (pills) the morning of surgery.  . THE DAY BEFORE SURGERY, Do not take your Jardiance            Reviewed and Endorsed by [facility] Patient Education Committee, August 2015           [facility] - Preparing for Surgery Before surgery, you can play an important role.  Because skin  is not sterile, your skin needs to be as free of germs as possible.  You can reduce the number of germs on your skin by washing with CHG (chlorahexidine gluconate) soap before surgery.  CHG is an antiseptic cleaner which kills germs and bonds with the skin to continue killing germs even after washing. Please DO NOT use if you have an allergy to CHG or antibacterial soaps.  If your skin becomes reddened/irritated stop using the CHG and inform your nurse when you arrive at Short Stay. Do not shave (including legs and underarms) for at least 48 hours prior to the first CHG shower.  You may shave your face/neck. Please follow these instructions carefully:  1.  Shower with CHG Soap the night before surgery and the  morning of Surgery.  2.  If you choose to wash your hair, wash your hair first as usual  with your  normal  shampoo.  3.  After you shampoo, rinse your hair and body thoroughly to remove the  shampoo.                           4.  Use CHG as you would any other liquid soap.  You can apply chg directly  to the skin and wash                       Gently with a scrungie or clean washcloth.  5.  Apply the CHG Soap to your body ONLY FROM THE NECK DOWN.   Do not use on face/ open                           Wound or open sores. Avoid contact with eyes, ears mouth and genitals (private parts).                       Wash face,  Genitals (private parts) with your normal soap.             6.  Wash thoroughly, paying special attention to the area where your surgery  will be performed.  7.  Thoroughly rinse your body with warm water from the neck down.  8.  DO NOT shower/wash with your normal soap after using and rinsing off  the CHG Soap.                9.  Pat yourself dry with a clean towel.            10.  Wear clean pajamas.            11.  Place clean sheets on your bed the night of your first shower and do not  sleep with pets. Day of Surgery : Do not apply any lotions/deodorants the morning of surgery.  Please wear clean clothes to the hospital/surgery center.  FAILURE TO FOLLOW THESE INSTRUCTIONS MAY RESULT IN THE CANCELLATION OF YOUR SURGERY PATIENT SIGNATURE_________________________________  NURSE SIGNATURE__________________________________  ________________________________________________________________________   Adam Phenix  An incentive spirometer is a tool that can help keep your lungs clear and active. This tool measures how well you are filling your lungs with each breath. Taking long deep breaths may help reverse or decrease the chance of developing breathing (pulmonary) problems (especially infection) following:  A long period of time when you are unable to move or be active. BEFORE THE PROCEDURE   If the spirometer  includes an indicator to show your best  effort, your nurse or respiratory therapist will set it to a desired goal.  If possible, sit up straight or lean slightly forward. Try not to slouch.  Hold the incentive spirometer in an upright position. INSTRUCTIONS FOR USE  1. Sit on the edge of your bed if possible, or sit up as far as you can in bed or on a chair. 2. Hold the incentive spirometer in an upright position. 3. Breathe out normally. 4. Place the mouthpiece in your mouth and seal your lips tightly around it. 5. Breathe in slowly and as deeply as possible, raising the piston or the ball toward the top of the column. 6. Hold your breath for 3-5 seconds or for as long as possible. Allow the piston or ball to fall to the bottom of the column. 7. Remove the mouthpiece from your mouth and breathe out normally. 8. Rest for a few seconds and repeat Steps 1 through 7 at least 10 times every 1-2 hours when you are awake. Take your time and take a few normal breaths between deep breaths. 9. The spirometer may include an indicator to show your best effort. Use the indicator as a goal to work toward during each repetition. 10. After each set of 10 deep breaths, practice coughing to be sure your lungs are clear. If you have an incision (the cut made at the time of surgery), support your incision when coughing by placing a pillow or rolled up towels firmly against it. Once you are able to get out of bed, walk around indoors and cough well. You may stop using the incentive spirometer when instructed by your caregiver.  RISKS AND COMPLICATIONS  Take your time so you do not get dizzy or light-headed.  If you are in pain, you may need to take or ask for pain medication before doing incentive spirometry. It is harder to take a deep breath if you are having pain. AFTER USE  Rest and breathe slowly and easily.  It can be helpful to keep track of a log of your progress. Your caregiver can provide you with a simple table to help with this. If you  are using the spirometer at home, follow these instructions: [place] IF:   You are having difficultly using the spirometer.  You have trouble using the spirometer as often as instructed.  Your pain medication is not giving enough relief while using the spirometer.  You develop fever of 100.5 F (38.1 C) or higher. SEEK IMMEDIATE MEDICAL CARE IF:   You cough up bloody sputum that had not been present before.  You develop fever of 102 F (38.9 C) or greater.  You develop worsening pain at or near the incision site. MAKE SURE YOU:   Understand these instructions.  Will watch your condition.  Will get help right away if you are not doing well or get worse. Document Released: 11/13/2006 Document Revised: 09/25/2011 Document Reviewed: 01/14/2007 [facility] Patient Information 2014 [place], [place].   ________________________________________________________________________

## 2019-11-08 ENCOUNTER — Other Ambulatory Visit (HOSPITAL_COMMUNITY): Payer: Medicare Other

## 2019-11-09 ENCOUNTER — Other Ambulatory Visit: Payer: Self-pay | Admitting: Adult Health

## 2019-11-09 DIAGNOSIS — G4700 Insomnia, unspecified: Secondary | ICD-10-CM

## 2019-11-09 DIAGNOSIS — E039 Hypothyroidism, unspecified: Secondary | ICD-10-CM

## 2019-11-09 DIAGNOSIS — Z760 Encounter for issue of repeat prescription: Secondary | ICD-10-CM

## 2019-11-09 DIAGNOSIS — I10 Essential (primary) hypertension: Secondary | ICD-10-CM

## 2019-11-09 DIAGNOSIS — I2510 Atherosclerotic heart disease of native coronary artery without angina pectoris: Secondary | ICD-10-CM

## 2019-11-10 ENCOUNTER — Encounter: Payer: Self-pay | Admitting: Adult Health

## 2019-11-10 ENCOUNTER — Other Ambulatory Visit (HOSPITAL_COMMUNITY)
Admission: RE | Admit: 2019-11-10 | Discharge: 2019-11-10 | Disposition: A | Discharge status: Home / Self Care | Payer: Medicare Other | Source: Ambulatory Visit

## 2019-11-10 ENCOUNTER — Encounter (HOSPITAL_COMMUNITY)
Admission: RE | Admit: 2019-11-10 | Discharge: 2019-11-10 | Disposition: A | Discharge status: Home / Self Care | Payer: Medicare Other | Source: Ambulatory Visit | Attending: Orthopedic Surgery | Admitting: Orthopedic Surgery

## 2019-11-10 ENCOUNTER — Encounter (HOSPITAL_COMMUNITY): Payer: Self-pay

## 2019-11-10 ENCOUNTER — Other Ambulatory Visit: Payer: Self-pay

## 2019-11-10 DIAGNOSIS — E1122 Type 2 diabetes mellitus with diabetic chronic kidney disease: Secondary | ICD-10-CM | POA: Diagnosis not present

## 2019-11-10 DIAGNOSIS — E785 Hyperlipidemia, unspecified: Secondary | ICD-10-CM | POA: Diagnosis not present

## 2019-11-10 DIAGNOSIS — Z20822 Contact with and (suspected) exposure to covid-19: Secondary | ICD-10-CM | POA: Insufficient documentation

## 2019-11-10 DIAGNOSIS — M1611 Unilateral primary osteoarthritis, right hip: Secondary | ICD-10-CM | POA: Insufficient documentation

## 2019-11-10 DIAGNOSIS — Z7982 Long term (current) use of aspirin: Secondary | ICD-10-CM | POA: Insufficient documentation

## 2019-11-10 DIAGNOSIS — E039 Hypothyroidism, unspecified: Secondary | ICD-10-CM | POA: Diagnosis not present

## 2019-11-10 DIAGNOSIS — I2510 Atherosclerotic heart disease of native coronary artery without angina pectoris: Secondary | ICD-10-CM | POA: Diagnosis not present

## 2019-11-10 DIAGNOSIS — Z7984 Long term (current) use of oral hypoglycemic drugs: Secondary | ICD-10-CM | POA: Diagnosis not present

## 2019-11-10 DIAGNOSIS — I5032 Chronic diastolic (congestive) heart failure: Secondary | ICD-10-CM | POA: Insufficient documentation

## 2019-11-10 DIAGNOSIS — Z79890 Hormone replacement therapy (postmenopausal): Secondary | ICD-10-CM | POA: Insufficient documentation

## 2019-11-10 DIAGNOSIS — I252 Old myocardial infarction: Secondary | ICD-10-CM | POA: Insufficient documentation

## 2019-11-10 DIAGNOSIS — Z01812 Encounter for preprocedural laboratory examination: Secondary | ICD-10-CM | POA: Diagnosis not present

## 2019-11-10 DIAGNOSIS — I110 Hypertensive heart disease with heart failure: Secondary | ICD-10-CM | POA: Diagnosis not present

## 2019-11-10 DIAGNOSIS — Z79899 Other long term (current) drug therapy: Secondary | ICD-10-CM | POA: Diagnosis not present

## 2019-11-10 HISTORY — DX: Other specified postprocedural states: Z98.890

## 2019-11-10 HISTORY — DX: Other complications of anesthesia, initial encounter: T88.59XA

## 2019-11-10 HISTORY — DX: Other specified postprocedural states: R11.2

## 2019-11-10 LAB — GLUCOSE, CAPILLARY: Glucose-Capillary: 122 mg/dL — ABNORMAL HIGH (ref 70–99)

## 2019-11-10 LAB — SURGICAL PCR SCREEN
MRSA, PCR: NEGATIVE
Staphylococcus aureus: NEGATIVE

## 2019-11-10 LAB — SARS CORONAVIRUS 2 (TAT 6-24 HRS): SARS Coronavirus 2: NEGATIVE

## 2019-11-10 LAB — APTT: aPTT: 27 seconds (ref 24–36)

## 2019-11-10 LAB — PROTIME-INR
INR: 0.9 (ref 0.8–1.2)
Prothrombin Time: 12.1 seconds (ref 11.4–15.2)

## 2019-11-10 NOTE — Progress Notes (Addendum)
PCP - [name], NP w/surgical clearance on chart and in Epic dated 11-06-19  Cardiologist - Ermalinda Barrios w/clearance in [place] dated 10-29-19 along with pre-op office visit  Chest x-ray -  EKG - 10-29-19 Stress Test -  ECHO -  Cardiac Cath -   Sleep Study -  CPAP -  Hemoglobin A1C dated 11-05-19 6.9 Fasting Blood Sugar - 122 Checks Blood Sugar _____ times a day  Blood Thinner Instructions: Aspirin Instructions: Last Dose:  Anesthesia review:   Patient denies shortness of breath, fever, cough and chest pain at PAT appointment   Patient verbalized understanding of instructions that were given to them at the PAT appointment. Patient was also instructed that they will need to review over the PAT instructions again at home before surgery.

## 2019-11-11 NOTE — H&P (Signed)
TOTAL HIP ADMISSION H&P  Patient is admitted for right total hip arthroplasty.  Subjective:  Chief Complaint: Right hip pain  HPI: Rebecca [name], 74 y.o. female, has a history of pain and functional disability in the right hip due to arthritis and patient has failed non-surgical conservative treatments for greater than 12 weeks to include corticosteriod injections and activity modification. Onset of symptoms was gradual, starting 1 years ago with gradually worsening course since that time. The patient noted no past surgery on the right hip. Patient currently rates pain in the right hip at 8 out of 10 with activity. Patient has worsening of pain with activity and weight bearing, pain that interfers with activities of daily living and crepitus. Patient has evidence of a labral tear, anterior and posterior, with a posterior labral cyst. There are degenerative changes in the hip that have progressed since she had plain films back in 03/2019. There are areas that are near bone on bone by imaging studies. This condition presents safety issues increasing the risk of falls. There is no current active infection.  Patient Active Problem List   Diagnosis Date Noted  . Palpitations 08/28/2018  . Osteoporosis 12/25/2017  . Coronary artery disease   . Dysuria 02/17/2017  . Diarrhea 02/17/2017  . Vitamin D deficiency 11/07/2016  . Chronic diastolic CHF (congestive heart failure) ([place]) 11/18/2015  . Diabetes mellitus without complication ([place]) 0000000  . Reactive airway disease with acute exacerbation 09/10/2014  . Nonspecific (abnormal) findings on radiological and other examination of gastrointestinal tract 04/04/2012  . Insomnia, idiopathic 10/04/2010  . Hyperlipidemia 09/22/2009  . Essential hypertension 09/22/2009  . CAD, NATIVE VESSEL 09/22/2009  . EDEMA 05/19/2009  . Hypothyroidism 02/23/2009  . EXTRINSIC ASTHMA, UNSPECIFIED 02/10/2009  . KERATOSIS, SEBORRHEIC [place] 04/16/2007  . SKIN CANCER,  HX OF 02/14/2007  . Depression 01/24/2007  . MYOCARDIAL INFARCTION, HX OF 01/24/2007  . GERD 01/24/2007  . Osteoarthritis 01/24/2007  . COLONIC POLYPS, HX OF 01/24/2007  . IRRITABLE BOWEL SYNDROME, HX OF 01/24/2007    Past Medical History:  Diagnosis Date  . Asthma    extrinsic  . CAD (coronary artery disease)   . Chest pain, atypical   . Complication of anesthesia   . Diabetes ([place])   . Edema   . Fatigue   . Fatigue   . GERD (gastroesophageal reflux disease)   . Hyperlipidemia   . Hypertension    familiar  . Hypothyroidism   . IBS (irritable bowel syndrome)    hx  . Keratosis, seborrheic   . Labyrinthitis   . MI (myocardial infarction) ([place])    age 34  . Osteoarthritis   . PONV (postoperative nausea and vomiting)   . S/P arthroscopy of right shoulder 03/01/2018  . Skin cancer    Melanoma     Past Surgical History:  Procedure Laterality Date  . Breast Biopsy     dense breast tissue  . BREAST EXCISIONAL BIOPSY Left 2000   benign  . CHOLECYSTECTOMY    . EUS  04/04/2012   Procedure: UPPER ENDOSCOPIC ULTRASOUND (EUS) LINEAR;  Surgeon: Milus Banister, MD;  Location: [facility];  Service: Endoscopy;  Laterality: N/A;  radial linear  . KNEE ARTHROPLASTY     total right  02-28-2010 Dr Pilar Plate Aluisio, left  . KNEE ARTHROPLASTY Bilateral 09/12/2017   Hector Shade  . LEFT HEART CATH AND CORONARY ANGIOGRAPHY N/A 04/20/2017   Procedure: LEFT HEART CATH AND CORONARY ANGIOGRAPHY;  Surgeon: Burnell Blanks, MD;  Location:  [facility];  Service: Cardiovascular;  Laterality: N/A;  . TUBAL LIGATION      Prior to Admission medications   Medication Sig Start Date End Date Taking? Authorizing Provider  acetaminophen (TYLENOL) 325 MG tablet Take 650 mg by mouth every 6 (six) hours as needed for moderate pain.    Yes [provider]  ALPRAZolam (XANAX) 0.5 MG tablet Take 1 tablet (0.5 mg total) by mouth 2 (two) times daily as needed for anxiety. Patient  taking differently: Take 0.25-0.5 mg by mouth at bedtime as needed for sleep.  11/05/19  Yes Nafziger, Tommi Rumps, NP  aspirin EC 81 MG tablet Take 1 tablet (81 mg total) by mouth daily. Patient taking differently: Take 81 mg by mouth daily.  04/18/17  Yes Dorothy Spark, MD  atorvastatin (LIPITOR) 80 MG tablet TAKE 1 TABLET BY MOUTH  DAILY 09/09/19  Yes Nafziger, Tommi Rumps, NP  calcium carbonate (OS-CAL) 600 MG TABS Take 1,200 mg by mouth daily.    Yes [provider]  Cholecalciferol (VITAMIN D) 2000 UNITS CAPS Take 1 capsule by mouth daily.     Yes [provider]  conjugated estrogens (PREMARIN) vaginal cream Place 0.5 Applicatorfuls vaginally every 7 (seven) days. 11/14/18  Yes Nafziger, Tommi Rumps, NP  denosumab (PROLIA) 60 MG/ML SOSY injection Inject 60 mg into the skin every 6 (six) months.   Yes [provider]  diltiazem (CARDIZEM CD) 240 MG 24 hr capsule TAKE 1 CAPSULE(240 MG) BY MOUTH DAILY Patient taking differently: Take 240 mg by mouth daily.  10/30/19  Yes [name], PA-C  empagliflozin (JARDIANCE) 10 MG TABS tablet Take 10 mg by mouth daily before breakfast. 09/19/19  Yes Nafziger, Tommi Rumps, NP  furosemide (LASIX) 20 MG tablet Take 1 tablet (20 mg total) by mouth 2 (two) times daily. Patient taking differently: Take 40 mg by mouth daily.  11/14/18  Yes Nafziger, Tommi Rumps, NP  levothyroxine (SYNTHROID) 50 MCG tablet TAKE 1 TABLET BY MOUTH  DAILY BEFORE BREAKFAST 11/14/18  Yes Nafziger, Tommi Rumps, NP  Naphazoline-Polyethyl Glycol (EYE DROPS) 0.012-0.2 % SOLN Apply 1 drop to eye as needed (dry eyes). Artifical eye drops   Yes [provider]  nitroGLYCERIN (NITROSTAT) 0.4 MG SL tablet DISSOLVE 1 TABLET UNDER THE TONGUE EVERY 5 MINUTES AS  NEEDED FOR CHEST PAIN. MAX  3 DOSES IN 15 MINUTES. CALL 911 IF CHEST PAIN PERSISTS Patient taking differently: Place 0.4 mg under the tongue every 5 (five) minutes as needed for chest pain.  07/24/17  Yes Dorothy Spark, MD  Omega-3 Fatty  Acids (FISH OIL) 1200 MG CAPS Take 1,200 mg by mouth daily.    Yes [provider]  potassium chloride SA (K-DUR) 20 MEQ tablet Take 1 tablet (20 mEq total) by mouth 2 (two) times daily. Patient taking differently: Take 20 mEq by mouth daily.  11/14/18  Yes Nafziger, Tommi Rumps, NP  PROAIR RESPICLICK 123XX123 (90 Base) MCG/ACT AEPB Inhale 2 puffs into the lungs every 6 (six) hours as needed (for SOB or Wheezing). 11/18/18  Yes Nafziger, Tommi Rumps, NP  traZODone (DESYREL) 50 MG tablet TAKE 1/2 TO 1 TABLET BY  MOUTH AT BEDTIME AS NEEDED  FOR SLEEP Patient taking differently: Take 25-50 mg by mouth at bedtime as needed for sleep.  06/05/19  Yes Nafziger, Tommi Rumps, NP  Accu-Chek FastClix Lancets MISC USE   TO CHECK GLUCOSE TWICE DAILY 11/14/18   Nafziger, Tommi Rumps, NP  ACCU-CHEK GUIDE test strip Use to test blood glucose twice daily 08/13/19  Nafziger, Tommi Rumps, NP  fluticasone (FLOVENT HFA) 44 MCG/ACT inhaler Inhale 2 puffs into the lungs 2 (two) times daily. 11/14/18   [name], NP    Allergies  Allergen Reactions  . Sulfa Antibiotics      Kathreen Cosier syndrome  . Sulfonamide Derivatives     Kathreen Cosier syndrome    Social History   Socioeconomic History  . Marital status: Married    Spouse name: Not on file  . Number of children: 2  . Years of education: Not on file  . Highest education level: Not on file  Occupational History  . Occupation: RN-retired    Employer: RETIRED  Tobacco Use  . Smoking status: Never Smoker  . Smokeless tobacco: Never Used  Substance and Sexual Activity  . Alcohol use: No    Comment: rarely  . Drug use: No  . Sexual activity: Not on file  Other Topics Concern  . Not on file  Social History Narrative   ** Merged History Encounter **       Social Determinants of Health   Financial Resource Strain:   . Difficulty of Paying Living Expenses:   Food Insecurity:   . Worried About Charity fundraiser in the Last Year:   . Arboriculturist in the Last Year:     Transportation Needs:   . Film/video editor (Medical):   Marland Kitchen Lack of Transportation (Non-Medical):   Physical Activity:   . Days of Exercise per Week:   . Minutes of Exercise per Session:   Stress:   . Feeling of Stress :   Social Connections:   . Frequency of Communication with Friends and Family:   . Frequency of Social Gatherings with Friends and Family:   . Attends Religious Services:   . Active Member of Clubs or Organizations:   . Attends Archivist Meetings:   Marland Kitchen Marital Status:   Intimate Partner Violence:   . Fear of Current or Ex-Partner:   . Emotionally Abused:   Marland Kitchen Physically Abused:   . Sexually Abused:       Tobacco Use: Low Risk   . Smoking Tobacco Use: Never Smoker  . Smokeless Tobacco Use: Never Used   Social History   Substance and Sexual Activity  Alcohol Use No   Comment: rarely    Family History  Problem Relation Age of Onset  . Heart disease Mother   . Hypertension Mother   . Heart attack Mother 62       MULTIPLES OVER TIME  . Melanoma Daughter 34       METASTATIC  . Breast cancer Sister   . Hypertension Father   . Hypertension Sister     Review of Systems  Constitutional: Negative for chills and fever.  HENT: Negative for congestion, sore throat and tinnitus.   Eyes: Negative for double vision, photophobia and pain.  Respiratory: Negative for cough, shortness of breath and wheezing.   Cardiovascular: Negative for chest pain, palpitations and orthopnea.  Gastrointestinal: Negative for heartburn, nausea and vomiting.  Genitourinary: Negative for dysuria, frequency and urgency.  Musculoskeletal: Positive for joint pain.  Neurological: Negative for dizziness, weakness and headaches.     Objective:  Physical Exam: Well nourished and well developed.  General: Alert and oriented x3, cooperative and pleasant, no acute distress.  Head: normocephalic, atraumatic, neck supple.  Eyes: EOMI.  Respiratory: breath sounds clear in  all fields, no wheezing, rales, or rhonchi. Cardiovascular: Regular rate and rhythm, no  murmurs, gallops or rubs.  Abdomen: non-tender to palpation and soft, normoactive bowel sounds. Musculoskeletal:  Right Hip Exam: Minimal trochanteric tenderness. Range of Motion: Flexion is to 110 degrees with pain, internal rotation is to 30 degrees with pain, external rotation is to 30 degrees with pain, and abduction is to 30 degrees with pain.  Calves soft and nontender. Motor function intact in LE. Strength 5/5 LE bilaterally. Neuro: Distal pulses 2+. Sensation to light touch intact in LE.  Vital signs in last 24 hours: Temp:  [98.3 F (36.8 C)] 98.3 F (36.8 C) (04/26 1547) Resp:  [18] 18 (04/26 1547) BP: (136)/(64) 136/64 (04/26 1547) SpO2:  [99 %] 99 % (04/26 1547) Weight:  [63.1 kg] 63.1 kg (04/26 1547)  Imaging Review Plain radiographs demonstrate severe degenerative joint disease of the right hip. The bone quality appears to be adequate for age and reported activity level.  Assessment/Plan:  End stage arthritis, right hip  The patient history, physical examination, clinical judgement of the provider and imaging studies are consistent with end stage degenerative joint disease of the right hip and total hip arthroplasty is deemed medically necessary. The treatment options including medical management, injection therapy, arthroscopy and arthroplasty were discussed at length. The risks and benefits of total hip arthroplasty were presented and reviewed. The risks due to aseptic loosening, infection, stiffness, dislocation/subluxation, thromboembolic complications and other imponderables were discussed. The patient acknowledged the explanation, agreed to proceed with the plan and consent was signed. Patient is being admitted for inpatient treatment for surgery, pain control, PT, OT, prophylactic antibiotics, VTE prophylaxis, progressive ambulation and ADLs and discharge planning.The patient is  planning to be discharged home.  Anticipated LOS equal to or greater than 2 midnights due to - Age 30 and older with one or more of the following:  - Obesity  - Expected need for hospital services (PT, OT, Nursing) required for safe  discharge  - Anticipated need for postoperative skilled nursing care or inpatient rehab  - Active co-morbidities: Diabetes and Coronary Artery Disease OR   - Unanticipated findings during/Post Surgery: None  - Patient is a high risk of re-admission due to: None   Therapy Plans: HEP Disposition: Home with husband Planned DVT Prophylaxis: Xarelto (hx of MI) DME Needed: none PCP: [name], NP (appointment on 4/21) Cardiologist: Dr. [name], clearance received TXA: IV Allergies: sulfa - itching Anesthesia Concerns: none BMI: 29.3 Last HgbA1c: 6.5%  Other:  - Hx of MI in 1992, s/p cardiac stents.  - Please hold all home meds while she is in the hospital, she was charged last time and prefers to wait until she goes home.   - Patient was instructed on what medications to stop prior to surgery. - Follow-up visit in 2 weeks with Dr. Wynelle Link - Begin physical therapy following surgery - Pre-operative lab work as pre-surgical testing - Prescriptions will be provided in hospital at time of discharge  Theresa Duty, PA-C Orthopedic Surgery [facility] [place]

## 2019-11-11 NOTE — Progress Notes (Signed)
Anesthesia Chart Review   Case: [id number] Date/Time: 11/12/19 0855   Procedure: TOTAL HIP ARTHROPLASTY ANTERIOR APPROACH (Right Hip) - 175min   Anesthesia type: Choice   Pre-op diagnosis: right hip osteoarthritis   [facility] 09 / [facility]   Surgeons: Gaynelle Arabian, MD      DISCUSSION:74 y.o. never smoker with h/o GERD, PONV, HLD, hypothyroidism, DM, CAD (s/p PCI to RCA in 2010), right hip OA scheduled for above procedure 11/12/2019 with Dr. Gaynelle Arabian.   Pt last seen by cardiology 10/29/2019 for preoperative evaluation.  Per OV note, "Preoperative clearance before undergoing hip surgery by Dr. Maureen Ralphs 11/19/19. Patient with history of CAD, last cath nonobstructive disease, no angina. Does have SVT and palpitations I'm increasing cardizem for.  Cardiac risk index 6.6 but mets 5.29. with no angina can proceed with hip surgery without further cardiac work up. According to the Revised Cardiac Risk Index (RCRI), her Perioperative Risk of Major Cardiac Event is (%): 6.6 Her Functional Capacity in METs is: 5.29 according to the Duke Activity Status Index (DASI)."  Anticipate pt can proceed with planned procedure barring acute status change.   VS: BP 136/64   Temp 36.8 C (Oral)   Resp 18   Ht 4\' 10"  (1.473 m)   Wt 63.1 kg   SpO2 99%   BMI 29.09 kg/m   PROVIDERS: [name], NP is PCP   Ena Dawley, MD is Cardiologist  LABS: Labs reviewed: Acceptable for surgery. (all labs ordered are listed, but only abnormal results are displayed)  Labs Reviewed  GLUCOSE, CAPILLARY - Abnormal; Notable for the following components:      Result Value   Glucose-Capillary 122 (*)    All other components within normal limits  SURGICAL PCR SCREEN  APTT  PROTIME-INR  TYPE AND SCREEN     IMAGES:   EKG: 10/29/2019 Rate 72 bpm NSR  unchanged  CV: Echo 09/04/2018 IMPRESSIONS    1. The left ventricle has normal systolic function, with an ejection  fraction of 55-60%. The cavity  size was normal. There is mildly increased  left ventricular wall thickness. Left ventricular diastolic Doppler  parameters are consistent with  pseudonormalization No evidence of left ventricular regional wall motion  abnormalities.  2. The right ventricle has normal systolic function. The cavity was  normal. There is no increase in right ventricular wall thickness.  3. The mitral valve is normal in structure. No evidence of mitral valve  stenosis. Trivial regurgitation.  4. The tricuspid valve is normal in structure.  5. The aortic valve is tricuspid no stenosis of the aortic valve.  6. The pulmonic valve was normal in structure.  7. The aortic root and ascending aorta are normal in size and structure.  8. IVC normal in size, PA systolic pressure 26 mmHg.   Cardiac Cath 04/20/2017  Prox RCA to Mid RCA lesion, 0 %stenosed.  Prox RCA lesion, 40 %stenosed.  Ost LAD to Prox LAD lesion, 20 %stenosed.  Prox LAD to Mid LAD lesion, 30 %stenosed.  The left ventricular systolic function is normal.  LV end diastolic pressure is normal.  The left ventricular ejection fraction is greater than 65% by visual estimate.  There is no mitral valve regurgitation.   1. Single vessel CAD with patent stent RCA. The RCA stent has mild to moderate restenosis. This does not appear to be flow limiting.  2. Mild disease LAD 3. Normal LV systolic function  Recommendations: Continue medical management of CAD.  Past Medical  History:  Diagnosis Date  . Asthma    extrinsic  . CAD (coronary artery disease)   . Chest pain, atypical   . Complication of anesthesia   . Diabetes ([place])   . Edema   . Fatigue   . Fatigue   . GERD (gastroesophageal reflux disease)   . Hyperlipidemia   . Hypertension    familiar  . Hypothyroidism   . IBS (irritable bowel syndrome)    hx  . Keratosis, seborrheic   . Labyrinthitis   . MI (myocardial infarction) ([place])    age 47  . Osteoarthritis   . PONV  (postoperative nausea and vomiting)   . S/P arthroscopy of right shoulder 03/01/2018  . Skin cancer    Melanoma     Past Surgical History:  Procedure Laterality Date  . Breast Biopsy     dense breast tissue  . BREAST EXCISIONAL BIOPSY Left 2000   benign  . CHOLECYSTECTOMY    . EUS  04/04/2012   Procedure: UPPER ENDOSCOPIC ULTRASOUND (EUS) LINEAR;  Surgeon: Milus Banister, MD;  [facility];  Service: Endoscopy;  Laterality: N/A;  radial linear  . KNEE ARTHROPLASTY     total right  02-28-2010 Dr Pilar Plate Aluisio, left  . KNEE ARTHROPLASTY Bilateral 09/12/2017   Hector Shade  . LEFT HEART CATH AND CORONARY ANGIOGRAPHY N/A 04/20/2017   Procedure: LEFT HEART CATH AND CORONARY ANGIOGRAPHY;  Surgeon: Burnell Blanks, MD;  Location: [facility] LAB;  Service: Cardiovascular;  Laterality: N/A;  . TUBAL LIGATION      MEDICATIONS: . Accu-Chek FastClix Lancets MISC  . ACCU-CHEK GUIDE test strip  . acetaminophen (TYLENOL) 325 MG tablet  . ALPRAZolam (XANAX) 0.5 MG tablet  . aspirin EC 81 MG tablet  . atorvastatin (LIPITOR) 80 MG tablet  . calcium carbonate (OS-CAL) 600 MG TABS  . Cholecalciferol (VITAMIN D) 2000 UNITS CAPS  . conjugated estrogens (PREMARIN) vaginal cream  . denosumab (PROLIA) 60 MG/ML SOSY injection  . diltiazem (CARDIZEM CD) 240 MG 24 hr capsule  . empagliflozin (JARDIANCE) 10 MG TABS tablet  . fluticasone (FLOVENT HFA) 44 MCG/ACT inhaler  . furosemide (LASIX) 20 MG tablet  . levothyroxine (SYNTHROID) 50 MCG tablet  . Naphazoline-Polyethyl Glycol (EYE DROPS) 0.012-0.2 % SOLN  . nitroGLYCERIN (NITROSTAT) 0.4 MG SL tablet  . Omega-3 Fatty Acids (FISH OIL) 1200 MG CAPS  . potassium chloride SA (K-DUR) 20 MEQ tablet  . PROAIR RESPICLICK 123XX123 (90 Base) MCG/ACT AEPB  . traZODone (DESYREL) 50 MG tablet   No current facility-administered medications for this encounter.    Maia Plan [facility] Pre-Surgical Testing [phone] 11/11/19  12:45 PM

## 2019-11-11 NOTE — Telephone Encounter (Signed)
Sent to the pharmacy by e-scribe for 90 days. 

## 2019-11-11 NOTE — Telephone Encounter (Signed)
Ok to fill for 90 days

## 2019-11-11 NOTE — Anesthesia Preprocedure Evaluation (Signed)
Anesthesia Evaluation  Patient identified by MRN, date of birth, ID band Patient awake    Reviewed: Allergy & Precautions, H&P , NPO status , Patient's Chart, lab work & pertinent test results, reviewed documented beta blocker date and time   History of Anesthesia Complications (+) PONV and history of anesthetic complications  Airway Mallampati: II  TM Distance: >3 FB Neck ROM: full    Dental no notable dental hx.    Pulmonary neg pulmonary ROS,    Pulmonary exam normal breath sounds clear to auscultation       Cardiovascular Exercise Tolerance: Good hypertension, + CAD, + Past MI and +CHF   Rhythm:regular Rate:Normal     Neuro/Psych PSYCHIATRIC DISORDERS Depression negative neurological ROS     GI/Hepatic Neg liver ROS, GERD  ,  Endo/Other  diabetesHypothyroidism   Renal/GU negative Renal ROS  negative genitourinary   Musculoskeletal  (+) Arthritis , Osteoarthritis,    Abdominal   Peds  Hematology negative hematology ROS (+)   Anesthesia Other Findings   Reproductive/Obstetrics negative OB ROS                             Anesthesia Physical Anesthesia Plan  ASA: III  Anesthesia Plan: Spinal and MAC   Post-op Pain Management:    Induction:   PONV Risk Score and Plan:   Airway Management Planned: Nasal Cannula, Simple Face Mask and Mask  Additional Equipment:   Intra-op Plan:   Post-operative Plan:   Informed Consent: I have reviewed the patients History and Physical, chart, labs and discussed the procedure including the risks, benefits and alternatives for the proposed anesthesia with the patient or authorized representative who has indicated his/her understanding and acceptance.     Dental Advisory Given  Plan Discussed with: CRNA  Anesthesia Plan Comments: (EKG: 10/29/2019 Rate 72 bpm NSR  unchanged  CV: Echo 09/04/2018 IMPRESSIONS  1. The left ventricle  has normal systolic function, with an ejection  fraction of 55-60%. The cavity size was normal. There is mildly increased  left ventricular wall thickness. Left ventricular diastolic Doppler  parameters are consistent with  pseudonormalization No evidence of left ventricular regional wall motion  abnormalities.  2. The right ventricle has normal systolic function. The cavity was  normal. There is no increase in right ventricular wall thickness.  3. The mitral valve is normal in structure. No evidence of mitral valve  stenosis. Trivial regurgitation.  4. The tricuspid valve is normal in structure.  5. The aortic valve is tricuspid no stenosis of the aortic valve.  6. The pulmonic valve was normal in structure.  7. The aortic root and ascending aorta are normal in size and structure.  8. IVC normal in size, PA systolic pressure 26 mmHg.   Cardiac Cath 04/20/2017  Prox RCA to Mid RCA lesion, 0 %stenosed.  Prox RCA lesion, 40 %stenosed.  Ost LAD to Prox LAD lesion, 20 %stenosed.  Prox LAD to Mid LAD lesion, 30 %stenosed.  The left ventricular systolic function is normal.  LV end diastolic pressure is normal.  The left ventricular ejection fraction is greater than 65% by visual estimate.  There is no mitral valve regurgitation.  1. Single vessel CAD with patent stent RCA. The RCA stent has mild to moderate restenosis. This does not appear to be flow limiting.  2. Mild disease LAD 3. Normal LV systolic function )        Anesthesia Quick Evaluation

## 2019-11-12 ENCOUNTER — Other Ambulatory Visit: Payer: Self-pay

## 2019-11-12 ENCOUNTER — Inpatient Hospital Stay (HOSPITAL_COMMUNITY): Payer: Medicare Other

## 2019-11-12 ENCOUNTER — Observation Stay (HOSPITAL_COMMUNITY): Payer: Medicare Other

## 2019-11-12 ENCOUNTER — Encounter (HOSPITAL_COMMUNITY)
Admission: RE | Disposition: A | Discharge status: Home / Self Care | Payer: Self-pay | Source: Ambulatory Visit | Attending: Orthopedic Surgery

## 2019-11-12 ENCOUNTER — Encounter (HOSPITAL_COMMUNITY): Payer: Self-pay | Admitting: Orthopedic Surgery

## 2019-11-12 ENCOUNTER — Observation Stay (HOSPITAL_COMMUNITY)
Admission: RE | Admit: 2019-11-12 | Discharge: 2019-11-13 | Disposition: A | Discharge status: Home / Self Care | Payer: Medicare Other | Source: Ambulatory Visit | Attending: Orthopedic Surgery | Admitting: Orthopedic Surgery

## 2019-11-12 ENCOUNTER — Inpatient Hospital Stay (HOSPITAL_COMMUNITY): Payer: Medicare Other | Admitting: Physician Assistant

## 2019-11-12 ENCOUNTER — Inpatient Hospital Stay (HOSPITAL_COMMUNITY): Payer: Medicare Other | Admitting: Anesthesiology

## 2019-11-12 DIAGNOSIS — Z79899 Other long term (current) drug therapy: Secondary | ICD-10-CM | POA: Insufficient documentation

## 2019-11-12 DIAGNOSIS — Z471 Aftercare following joint replacement surgery: Secondary | ICD-10-CM | POA: Diagnosis not present

## 2019-11-12 DIAGNOSIS — E039 Hypothyroidism, unspecified: Secondary | ICD-10-CM | POA: Diagnosis not present

## 2019-11-12 DIAGNOSIS — F329 Major depressive disorder, single episode, unspecified: Secondary | ICD-10-CM | POA: Insufficient documentation

## 2019-11-12 DIAGNOSIS — J45909 Unspecified asthma, uncomplicated: Secondary | ICD-10-CM | POA: Insufficient documentation

## 2019-11-12 DIAGNOSIS — Z96641 Presence of right artificial hip joint: Secondary | ICD-10-CM | POA: Diagnosis not present

## 2019-11-12 DIAGNOSIS — Z955 Presence of coronary angioplasty implant and graft: Secondary | ICD-10-CM | POA: Diagnosis not present

## 2019-11-12 DIAGNOSIS — Z882 Allergy status to sulfonamides status: Secondary | ICD-10-CM | POA: Insufficient documentation

## 2019-11-12 DIAGNOSIS — M810 Age-related osteoporosis without current pathological fracture: Secondary | ICD-10-CM | POA: Insufficient documentation

## 2019-11-12 DIAGNOSIS — Z7983 Long term (current) use of bisphosphonates: Secondary | ICD-10-CM | POA: Insufficient documentation

## 2019-11-12 DIAGNOSIS — M1611 Unilateral primary osteoarthritis, right hip: Principal | ICD-10-CM | POA: Diagnosis present

## 2019-11-12 DIAGNOSIS — E119 Type 2 diabetes mellitus without complications: Secondary | ICD-10-CM | POA: Insufficient documentation

## 2019-11-12 DIAGNOSIS — M169 Osteoarthritis of hip, unspecified: Secondary | ICD-10-CM | POA: Diagnosis present

## 2019-11-12 DIAGNOSIS — I110 Hypertensive heart disease with heart failure: Secondary | ICD-10-CM | POA: Diagnosis not present

## 2019-11-12 DIAGNOSIS — Z79890 Hormone replacement therapy (postmenopausal): Secondary | ICD-10-CM | POA: Insufficient documentation

## 2019-11-12 DIAGNOSIS — Z7984 Long term (current) use of oral hypoglycemic drugs: Secondary | ICD-10-CM | POA: Insufficient documentation

## 2019-11-12 DIAGNOSIS — E785 Hyperlipidemia, unspecified: Secondary | ICD-10-CM | POA: Diagnosis not present

## 2019-11-12 DIAGNOSIS — Z7982 Long term (current) use of aspirin: Secondary | ICD-10-CM | POA: Diagnosis not present

## 2019-11-12 DIAGNOSIS — Z96649 Presence of unspecified artificial hip joint: Secondary | ICD-10-CM

## 2019-11-12 DIAGNOSIS — Z7951 Long term (current) use of inhaled steroids: Secondary | ICD-10-CM | POA: Diagnosis not present

## 2019-11-12 DIAGNOSIS — I252 Old myocardial infarction: Secondary | ICD-10-CM | POA: Insufficient documentation

## 2019-11-12 DIAGNOSIS — Z419 Encounter for procedure for purposes other than remedying health state, unspecified: Secondary | ICD-10-CM

## 2019-11-12 DIAGNOSIS — I2510 Atherosclerotic heart disease of native coronary artery without angina pectoris: Secondary | ICD-10-CM | POA: Diagnosis not present

## 2019-11-12 DIAGNOSIS — I5032 Chronic diastolic (congestive) heart failure: Secondary | ICD-10-CM | POA: Diagnosis not present

## 2019-11-12 HISTORY — PX: TOTAL HIP ARTHROPLASTY: SHX124

## 2019-11-12 LAB — TYPE AND SCREEN
ABO/RH(D): A NEG
Antibody Screen: NEGATIVE

## 2019-11-12 LAB — GLUCOSE, CAPILLARY
Glucose-Capillary: 126 mg/dL — ABNORMAL HIGH (ref 70–99)
Glucose-Capillary: 120 mg/dL — ABNORMAL HIGH (ref 70–99)

## 2019-11-12 SURGERY — ARTHROPLASTY, HIP, TOTAL, ANTERIOR APPROACH
Anesthesia: Monitor Anesthesia Care | Site: Hip | Laterality: Right

## 2019-11-12 MED ORDER — FENTANYL CITRATE (PF) 100 MCG/2ML IJ SOLN: 25.0000 ug | INTRAMUSCULAR | Status: DC | PRN

## 2019-11-12 MED ORDER — NITROGLYCERIN 0.4 MG SL SUBL: 0.4000 mg | SUBLINGUAL_TABLET | SUBLINGUAL | Status: DC | PRN

## 2019-11-12 MED ORDER — PHENOL 1.4 % MT LIQD: 1.0000 | OROMUCOSAL | Status: DC | PRN

## 2019-11-12 MED ORDER — ONDANSETRON HCL 4 MG/2ML IJ SOLN
INTRAMUSCULAR | Status: DC | PRN
  Administered 2019-11-12: 4 mg via INTRAVENOUS

## 2019-11-12 MED ORDER — CEFAZOLIN SODIUM-DEXTROSE 2-4 GM/100ML-% IV SOLN
2.0000 g | INTRAVENOUS | Status: AC
  Administered 2019-11-12: 2 g via INTRAVENOUS

## 2019-11-12 MED ORDER — DEXAMETHASONE SODIUM PHOSPHATE 10 MG/ML IJ SOLN
INTRAMUSCULAR | Status: AC
  Filled 2019-11-12: qty 1

## 2019-11-12 MED ORDER — 0.9 % SODIUM CHLORIDE (POUR BTL) OPTIME
TOPICAL | Status: DC | PRN
  Administered 2019-11-12: 1000 mL

## 2019-11-12 MED ORDER — TRANEXAMIC ACID-NACL 1000-0.7 MG/100ML-% IV SOLN
1000.0000 mg | INTRAVENOUS | Status: AC
  Administered 2019-11-12: 1000 mg via INTRAVENOUS

## 2019-11-12 MED ORDER — PROPOFOL 1000 MG/100ML IV EMUL
INTRAVENOUS | Status: AC
  Filled 2019-11-12: qty 100

## 2019-11-12 MED ORDER — METOCLOPRAMIDE HCL 5 MG PO TABS: 5.0000 mg | ORAL_TABLET | Freq: Three times a day (TID) | ORAL | Status: DC | PRN

## 2019-11-12 MED ORDER — CEFAZOLIN SODIUM-DEXTROSE 2-4 GM/100ML-% IV SOLN
2.0000 g | Freq: Four times a day (QID) | INTRAVENOUS | Status: AC
  Administered 2019-11-12 (×2): 2 g via INTRAVENOUS
  Filled 2019-11-12 (×2): qty 100

## 2019-11-12 MED ORDER — PHENYLEPHRINE HCL-NACL 10-0.9 MG/250ML-% IV SOLN
INTRAVENOUS | Status: DC | PRN
Start: 2019-11-12 — End: 2019-11-12
  Administered 2019-11-12: 30 ug/min via INTRAVENOUS

## 2019-11-12 MED ORDER — HYDROCODONE-ACETAMINOPHEN 5-325 MG PO TABS
1.0000 | ORAL_TABLET | ORAL | Status: DC | PRN
  Administered 2019-11-12: 2 via ORAL
  Filled 2019-11-12: qty 2

## 2019-11-12 MED ORDER — METOCLOPRAMIDE HCL 5 MG/ML IJ SOLN: 5.0000 mg | Freq: Three times a day (TID) | INTRAMUSCULAR | Status: DC | PRN

## 2019-11-12 MED ORDER — DILTIAZEM HCL ER COATED BEADS 240 MG PO CP24
240.0000 mg | ORAL_CAPSULE | Freq: Every day | ORAL | Status: DC
  Administered 2019-11-13: 240 mg via ORAL
  Filled 2019-11-12: qty 1

## 2019-11-12 MED ORDER — OXYCODONE HCL 5 MG PO TABS: 5.0000 mg | ORAL_TABLET | Freq: Once | ORAL | Status: DC | PRN

## 2019-11-12 MED ORDER — DEXAMETHASONE SODIUM PHOSPHATE 10 MG/ML IJ SOLN
INTRAMUSCULAR | Status: DC | PRN
  Administered 2019-11-12: 8 mg via INTRAVENOUS

## 2019-11-12 MED ORDER — OXYCODONE HCL 5 MG/5ML PO SOLN: 5.0000 mg | Freq: Once | ORAL | Status: DC | PRN

## 2019-11-12 MED ORDER — PHENYLEPHRINE HCL (PRESSORS) 10 MG/ML IV SOLN
INTRAVENOUS | Status: AC
  Filled 2019-11-12: qty 1

## 2019-11-12 MED ORDER — ALPRAZOLAM 0.25 MG PO TABS: 0.2500 mg | ORAL_TABLET | Freq: Every evening | ORAL | Status: DC | PRN

## 2019-11-12 MED ORDER — PROPOFOL 10 MG/ML IV BOLUS
INTRAVENOUS | Status: AC
  Filled 2019-11-12: qty 20

## 2019-11-12 MED ORDER — TRAMADOL HCL 50 MG PO TABS: 50.0000 mg | ORAL_TABLET | Freq: Four times a day (QID) | ORAL | Status: DC | PRN

## 2019-11-12 MED ORDER — FUROSEMIDE 20 MG PO TABS
20.0000 mg | ORAL_TABLET | Freq: Two times a day (BID) | ORAL | Status: DC
  Administered 2019-11-13: 20 mg via ORAL
  Filled 2019-11-12: qty 1

## 2019-11-12 MED ORDER — WATER FOR IRRIGATION, STERILE IR SOLN
Status: DC | PRN
  Administered 2019-11-12: 2000 mL

## 2019-11-12 MED ORDER — LACTATED RINGERS IV SOLN
INTRAVENOUS | Status: DC
  Administered 2019-11-12: 1000 mL via INTRAVENOUS

## 2019-11-12 MED ORDER — ONDANSETRON HCL 4 MG/2ML IJ SOLN
INTRAMUSCULAR | Status: AC
  Filled 2019-11-12: qty 2

## 2019-11-12 MED ORDER — ACETAMINOPHEN 10 MG/ML IV SOLN
1000.0000 mg | Freq: Four times a day (QID) | INTRAVENOUS | Status: DC
  Administered 2019-11-12: 1000 mg via INTRAVENOUS

## 2019-11-12 MED ORDER — MORPHINE SULFATE (PF) 2 MG/ML IV SOLN
0.5000 mg | INTRAVENOUS | Status: DC | PRN
  Administered 2019-11-12: 1 mg via INTRAVENOUS
  Filled 2019-11-12: qty 1

## 2019-11-12 MED ORDER — METHOCARBAMOL 500 MG IVPB - SIMPLE MED
500.0000 mg | Freq: Four times a day (QID) | INTRAVENOUS | Status: DC | PRN
  Filled 2019-11-12: qty 50

## 2019-11-12 MED ORDER — SODIUM CHLORIDE 0.9 % IV SOLN: INTRAVENOUS | Status: DC

## 2019-11-12 MED ORDER — MAGNESIUM CITRATE PO SOLN: 1.0000 | Freq: Once | ORAL | Status: DC | PRN

## 2019-11-12 MED ORDER — ONDANSETRON HCL 4 MG/2ML IJ SOLN: 4.0000 mg | Freq: Once | INTRAMUSCULAR | Status: DC | PRN

## 2019-11-12 MED ORDER — BUPIVACAINE HCL 0.25 % IJ SOLN
INTRAMUSCULAR | Status: DC | PRN
  Administered 2019-11-12: 30 mL

## 2019-11-12 MED ORDER — EPHEDRINE SULFATE-NACL 50-0.9 MG/10ML-% IV SOSY
PREFILLED_SYRINGE | INTRAVENOUS | Status: DC | PRN
  Administered 2019-11-12: 10 mg via INTRAVENOUS
  Administered 2019-11-12: 5 mg via INTRAVENOUS

## 2019-11-12 MED ORDER — DEXAMETHASONE SODIUM PHOSPHATE 10 MG/ML IJ SOLN
10.0000 mg | Freq: Once | INTRAMUSCULAR | Status: AC
  Administered 2019-11-13: 10 mg via INTRAVENOUS
  Filled 2019-11-12: qty 1

## 2019-11-12 MED ORDER — PROPOFOL 500 MG/50ML IV EMUL
INTRAVENOUS | Status: DC | PRN
  Administered 2019-11-12: 100 ug/kg/min via INTRAVENOUS

## 2019-11-12 MED ORDER — ACETAMINOPHEN 325 MG PO TABS: 325.0000 mg | ORAL_TABLET | ORAL | Status: DC | PRN

## 2019-11-12 MED ORDER — MENTHOL 3 MG MT LOZG: 1.0000 | LOZENGE | OROMUCOSAL | Status: DC | PRN

## 2019-11-12 MED ORDER — METHOCARBAMOL 500 MG PO TABS
500.0000 mg | ORAL_TABLET | Freq: Four times a day (QID) | ORAL | Status: DC | PRN
  Administered 2019-11-12 – 2019-11-13 (×3): 500 mg via ORAL
  Filled 2019-11-12 (×3): qty 1

## 2019-11-12 MED ORDER — TRANEXAMIC ACID-NACL 1000-0.7 MG/100ML-% IV SOLN
INTRAVENOUS | Status: AC
  Filled 2019-11-12: qty 100

## 2019-11-12 MED ORDER — OXYCODONE HCL 5 MG PO TABS
5.0000 mg | ORAL_TABLET | ORAL | Status: DC | PRN
  Administered 2019-11-12 – 2019-11-13 (×3): 5 mg via ORAL
  Administered 2019-11-13: 10 mg via ORAL
  Filled 2019-11-12 (×2): qty 1
  Filled 2019-11-12: qty 2
  Filled 2019-11-12: qty 1

## 2019-11-12 MED ORDER — MIDAZOLAM HCL 2 MG/2ML IJ SOLN
INTRAMUSCULAR | Status: AC
  Filled 2019-11-12: qty 2

## 2019-11-12 MED ORDER — ACETAMINOPHEN 160 MG/5ML PO SOLN: 325.0000 mg | ORAL | Status: DC | PRN

## 2019-11-12 MED ORDER — RIVAROXABAN 10 MG PO TABS
10.0000 mg | ORAL_TABLET | Freq: Every day | ORAL | Status: DC
  Administered 2019-11-13: 10 mg via ORAL
  Filled 2019-11-12: qty 1

## 2019-11-12 MED ORDER — ACETAMINOPHEN 10 MG/ML IV SOLN
INTRAVENOUS | Status: AC
  Filled 2019-11-12: qty 100

## 2019-11-12 MED ORDER — MIDAZOLAM HCL 2 MG/2ML IJ SOLN
INTRAMUSCULAR | Status: DC | PRN
  Administered 2019-11-12: 2 mg via INTRAVENOUS

## 2019-11-12 MED ORDER — ACETAMINOPHEN 325 MG PO TABS
325.0000 mg | ORAL_TABLET | Freq: Four times a day (QID) | ORAL | Status: DC | PRN
  Administered 2019-11-12: 650 mg via ORAL
  Filled 2019-11-12: qty 2

## 2019-11-12 MED ORDER — POVIDONE-IODINE 10 % EX SWAB
2.0000 "application " | Freq: Once | CUTANEOUS | Status: AC
  Administered 2019-11-12: 2 via TOPICAL

## 2019-11-12 MED ORDER — BUPIVACAINE HCL (PF) 0.25 % IJ SOLN
INTRAMUSCULAR | Status: AC
  Filled 2019-11-12: qty 30

## 2019-11-12 MED ORDER — DEXAMETHASONE SODIUM PHOSPHATE 10 MG/ML IJ SOLN: 8.0000 mg | Freq: Once | INTRAMUSCULAR | Status: DC

## 2019-11-12 MED ORDER — BISACODYL 10 MG RE SUPP: 10.0000 mg | Freq: Every day | RECTAL | Status: DC | PRN

## 2019-11-12 MED ORDER — HYDROCODONE-ACETAMINOPHEN 7.5-325 MG PO TABS
1.0000 | ORAL_TABLET | ORAL | Status: DC | PRN
  Administered 2019-11-12: 2 via ORAL
  Filled 2019-11-12: qty 2

## 2019-11-12 MED ORDER — PROPOFOL 10 MG/ML IV BOLUS
INTRAVENOUS | Status: DC | PRN
  Administered 2019-11-12: 30 mg via INTRAVENOUS

## 2019-11-12 MED ORDER — DOCUSATE SODIUM 100 MG PO CAPS
100.0000 mg | ORAL_CAPSULE | Freq: Two times a day (BID) | ORAL | Status: DC
  Administered 2019-11-12 – 2019-11-13 (×3): 100 mg via ORAL
  Filled 2019-11-12 (×3): qty 1

## 2019-11-12 MED ORDER — POLYETHYLENE GLYCOL 3350 17 G PO PACK: 17.0000 g | PACK | Freq: Every day | ORAL | Status: DC | PRN

## 2019-11-12 MED ORDER — MEPERIDINE HCL 50 MG/ML IJ SOLN: 6.2500 mg | INTRAMUSCULAR | Status: DC | PRN

## 2019-11-12 MED ORDER — BUPIVACAINE IN DEXTROSE 0.75-8.25 % IT SOLN
INTRATHECAL | Status: DC | PRN
Start: 2019-11-12 — End: 2019-11-12
  Administered 2019-11-12: 1.4 mL via INTRATHECAL

## 2019-11-12 MED ORDER — TRAZODONE HCL 50 MG PO TABS: 25.0000 mg | ORAL_TABLET | Freq: Every evening | ORAL | Status: DC | PRN

## 2019-11-12 MED ORDER — CEFAZOLIN SODIUM-DEXTROSE 2-4 GM/100ML-% IV SOLN
INTRAVENOUS | Status: AC
  Filled 2019-11-12: qty 100

## 2019-11-12 MED ORDER — CANAGLIFLOZIN 100 MG PO TABS
100.0000 mg | ORAL_TABLET | Freq: Every day | ORAL | Status: DC
  Administered 2019-11-13: 100 mg via ORAL
  Filled 2019-11-12: qty 1

## 2019-11-12 MED ORDER — ONDANSETRON HCL 4 MG PO TABS: 4.0000 mg | ORAL_TABLET | Freq: Four times a day (QID) | ORAL | Status: DC | PRN

## 2019-11-12 MED ORDER — ATORVASTATIN CALCIUM 40 MG PO TABS
80.0000 mg | ORAL_TABLET | Freq: Every day | ORAL | Status: DC
  Administered 2019-11-13: 80 mg via ORAL
  Filled 2019-11-12: qty 2

## 2019-11-12 MED ORDER — LEVOTHYROXINE SODIUM 50 MCG PO TABS
50.0000 ug | ORAL_TABLET | Freq: Every day | ORAL | Status: DC
  Administered 2019-11-13: 50 ug via ORAL
  Filled 2019-11-12: qty 1

## 2019-11-12 MED ORDER — ONDANSETRON HCL 4 MG/2ML IJ SOLN: 4.0000 mg | Freq: Four times a day (QID) | INTRAMUSCULAR | Status: DC | PRN

## 2019-11-12 SURGICAL SUPPLY — 46 items
BAG DECANTER FOR FLEXI CONT (MISCELLANEOUS) IMPLANT
BAG SPEC THK2 15X12 ZIP CLS (MISCELLANEOUS)
BAG ZIPLOCK 12X15 (MISCELLANEOUS) IMPLANT
BLADE SAG 18X100X1.27 (BLADE) ×3 IMPLANT
CLOSURE WOUND 1/2 X4 (GAUZE/BANDAGES/DRESSINGS) ×1
COVER PERINEAL POST (MISCELLANEOUS) ×3 IMPLANT
COVER SURGICAL LIGHT HANDLE (MISCELLANEOUS) ×3 IMPLANT
COVER WAND RF STERILE (DRAPES) IMPLANT
CUP ACETBLR 48 OD SECTOR II (Hips) ×2 IMPLANT
DECANTER SPIKE VIAL GLASS SM (MISCELLANEOUS) ×3 IMPLANT
DRAPE STERI IOBAN 125X83 (DRAPES) ×3 IMPLANT
DRAPE U-SHAPE 47X51 STRL (DRAPES) ×6 IMPLANT
DRSG ADAPTIC 3X8 NADH LF (GAUZE/BANDAGES/DRESSINGS) ×3 IMPLANT
DRSG AQUACEL AG ADV 3.5X10 (GAUZE/BANDAGES/DRESSINGS) ×3 IMPLANT
DURAPREP 26ML APPLICATOR (WOUND CARE) ×3 IMPLANT
ELECT REM PT RETURN 15FT ADLT (MISCELLANEOUS) ×3 IMPLANT
EVACUATOR 1/8 PVC DRAIN (DRAIN) IMPLANT
GLOVE BIO SURGEON STRL SZ 6 (GLOVE) IMPLANT
GLOVE BIO SURGEON STRL SZ7 (GLOVE) IMPLANT
GLOVE BIO SURGEON STRL SZ8 (GLOVE) ×3 IMPLANT
GLOVE BIOGEL PI IND STRL 6.5 (GLOVE) IMPLANT
GLOVE BIOGEL PI IND STRL 7.0 (GLOVE) IMPLANT
GLOVE BIOGEL PI IND STRL 8 (GLOVE) ×1 IMPLANT
GLOVE BIOGEL PI INDICATOR 6.5 (GLOVE)
GLOVE BIOGEL PI INDICATOR 7.0 (GLOVE)
GLOVE BIOGEL PI INDICATOR 8 (GLOVE) ×2
GOWN STRL REUS W/TWL LRG LVL3 (GOWN DISPOSABLE) ×3 IMPLANT
GOWN STRL REUS W/TWL XL LVL3 (GOWN DISPOSABLE) IMPLANT
HEAD FEM STD 28X+1.5 STRL (Hips) ×2 IMPLANT
HOLDER FOLEY CATH W/STRAP (MISCELLANEOUS) ×3 IMPLANT
KIT TURNOVER KIT A (KITS) IMPLANT
LINER MARATHON 28 48 (Hips) ×2 IMPLANT
MANIFOLD NEPTUNE II (INSTRUMENTS) ×3 IMPLANT
PACK ANTERIOR HIP CUSTOM (KITS) ×3 IMPLANT
PENCIL SMOKE EVACUATOR COATED (MISCELLANEOUS) ×3 IMPLANT
STEM FEM SZ3 STD ACTIS (Stem) ×2 IMPLANT
STRIP CLOSURE SKIN 1/2X4 (GAUZE/BANDAGES/DRESSINGS) ×2 IMPLANT
SUT ETHIBOND NAB CT1 #1 30IN (SUTURE) ×3 IMPLANT
SUT MNCRL AB 4-0 PS2 18 (SUTURE) ×3 IMPLANT
SUT STRATAFIX 0 PDS 27 VIOLET (SUTURE) ×3
SUT VIC AB 2-0 CT1 27 (SUTURE) ×6
SUT VIC AB 2-0 CT1 TAPERPNT 27 (SUTURE) ×2 IMPLANT
SUTURE STRATFX 0 PDS 27 VIOLET (SUTURE) ×1 IMPLANT
SYR 50ML LL SCALE MARK (SYRINGE) IMPLANT
TRAY FOLEY MTR SLVR 14FR STAT (SET/KITS/TRAYS/PACK) ×2 IMPLANT
YANKAUER SUCT BULB TIP 10FT TU (MISCELLANEOUS) ×3 IMPLANT

## 2019-11-12 NOTE — Evaluation (Signed)
Physical Therapy Evaluation Patient Details Name: Rebecca [name] MRN: [id number] DOB: 10-10-1945 Today's Date: 11/12/2019   History of Present Illness  Patient is 74 y.o. female s/p Rt THA anterior approach with PMH significant for OA, MI, hypothyroidism, HTN, HLD, GERD, DM, CAD, asthma, bil TKA.    Clinical Impression  Rebecca [name] is a 74 y.o. female POD 0 s/p Rt THA. Patient reports independence with mobility at baseline. Patient is now limited by functional impairments (see PT problem list below) and requires min assist for transfers and gait with RW. Patient was able to ambulate ~60 feet with RW and min assist. Patient instructed in exercise to facilitate LE strength and circulation. Patient will benefit from continued skilled PT interventions to address impairments and progress towards PLOF. Acute PT will follow to progress mobility and stair training in preparation for safe discharge home.     Follow Up Recommendations Follow surgeon's recommendation for DC plan and follow-up therapies    Equipment Recommendations  None recommended by PT    Recommendations for Other Services       Precautions / Restrictions Precautions Precautions: Fall Restrictions Weight Bearing Restrictions: No Other Position/Activity Restrictions: WBAT      Mobility  Bed Mobility Overal bed mobility: Needs Assistance Bed Mobility: Supine to Sit     Supine to sit: Min assist;HOB elevated     General bed mobility comments: cues for use of bed rail, assist for Rt LE to bring to EOB.  Transfers Overall transfer level: Needs assistance Equipment used: Rolling walker (2 wheeled) Transfers: Sit to/from Stand Sit to Stand: Min assist         General transfer comment: cues for technique with RW, assist with power up, pt steady after rising.  Ambulation/Gait Ambulation/Gait assistance: Min assist Gait Distance (Feet): 60 Feet Assistive device: Rolling walker (2 wheeled) Gait  Pattern/deviations: Step-to pattern;Step-through pattern;Decreased stride length;Decreased weight shift to right Gait velocity: decreased   General Gait Details: cues for sfae step sequencing and close proximity to RW. no overt LOB noted.  Stairs            Wheelchair Mobility    Modified Rankin (Stroke Patients Only)       Balance Overall balance assessment: Needs assistance Sitting-balance support: Feet supported Sitting balance-Leahy Scale: Good     Standing balance support: During functional activity;Bilateral upper extremity supported Standing balance-Leahy Scale: Fair              Pertinent Vitals/Pain Pain Assessment: 0-10 Pain Score: 4  Pain Location: Rt hip Pain Descriptors / Indicators: Aching;Burning Pain Intervention(s): Limited activity within patient's tolerance;Monitored during session;Repositioned;Ice applied    Home Living Family/patient expects to be discharged to:: Private residence Living Arrangements: Spouse/significant other Available Help at Discharge: Family Type of Home: House Home Access: Stairs to enter Entrance Stairs-Rails: Can reach both Entrance Stairs-Number of Steps: 3 Home Layout: Two level;Able to live on main level with bedroom/bathroom;Full bath on main level Home Equipment: Shower seat - built in;Grab bars - tub/shower;Hand held Tourist information centre manager - 2 wheels;Cane - single point Additional Comments: plans to move to [place] soon    Prior Function Level of Independence: Independent               Hand Dominance   Dominant Hand: Right    Extremity/Trunk Assessment   Upper Extremity Assessment Upper Extremity Assessment: Overall WFL for tasks assessed    Lower Extremity Assessment Lower Extremity Assessment: Overall WFL for tasks assessed  Cervical / Trunk Assessment Cervical / Trunk Assessment: Normal  Communication   Communication: No difficulties  Cognition Arousal/Alertness: Awake/alert Behavior  During Therapy: WFL for tasks assessed/performed Overall Cognitive Status: Within Functional Limits for tasks assessed           General Comments      Exercises Total Joint Exercises Ankle Circles/Pumps: AROM;Both;20 reps;Seated Long Arc Quad: AROM;10 reps;Right;Seated   Assessment/Plan    PT Assessment Patient needs continued PT services  PT Problem List Decreased strength;Decreased range of motion;Decreased activity tolerance;Decreased balance;Decreased mobility;Decreased knowledge of use of DME       PT Treatment Interventions DME instruction;Gait training;Stair training;Therapeutic activities;Functional mobility training;Therapeutic exercise;Balance training;Patient/family education    PT Goals (Current goals can be found in the Care Plan section)  Acute Rehab PT Goals Patient Stated Goal: to get recoverd and back to walking and yoga PT Goal Formulation: With patient Time For Goal Achievement: 11/19/19 Potential to Achieve Goals: Good    Frequency 7X/week    AM-PAC PT "6 Clicks" Mobility  Outcome Measure Help needed turning from your back to your side while in a flat bed without using bedrails?: A Little Help needed moving from lying on your back to sitting on the side of a flat bed without using bedrails?: A Little Help needed moving to and from a bed to a chair (including a wheelchair)?: A Little Help needed standing up from a chair using your arms (e.g., wheelchair or bedside chair)?: A Little Help needed to walk in hospital room?: A Little Help needed climbing 3-5 steps with a railing? : A Little 6 Click Score: 18    End of Session Equipment Utilized During Treatment: Gait belt Activity Tolerance: Patient tolerated treatment well Patient left: in chair;with call bell/phone within reach;with chair alarm set Nurse Communication: Mobility status PT Visit Diagnosis: Muscle weakness (generalized) (M62.81);Difficulty in walking, not elsewhere classified (R26.2)     Time: [id number] PT Time Calculation (min) (ACUTE ONLY): 20 min   Charges:   PT Evaluation $PT Eval Low Complexity: 1 Low         Verner Mould, DPT Physical Therapist with [facility] [phone]  11/12/2019 3:50 PM

## 2019-11-12 NOTE — Discharge Instructions (Addendum)
°Frank Aluisio, MD °Total Joint Specialist °[facility] °[address] °[place],  [address] °[phone] ° °ANTERIOR APPROACH TOTAL HIP REPLACEMENT POSTOPERATIVE DIRECTIONS ° ° ° ° °Hip Rehabilitation, Guidelines Following Surgery  °The results of a hip operation are greatly improved after range of motion and muscle strengthening exercises. Follow all safety measures which are given to protect your hip. If any of these exercises cause increased pain or swelling in your joint, decrease the amount until you are comfortable again. Then slowly increase the exercises. Call your caregiver if you have problems or questions.  ° °BLOOD CLOT PREVENTION °• Take a 10 mg Xarelto once a day for three weeks following surgery. Then resume one 81 mg aspirin once a day. °• You may resume your vitamins/supplements once you have discontinued the Xarelto. °• Do not take any NSAIDs (Advil, Aleve, Ibuprofen, Meloxicam, etc.) until you have discontinued the Xarelto.  ° °HOME CARE INSTRUCTIONS  °• Remove items at home which could result in a fall. This includes throw rugs or furniture in walking pathways.  °· ICE to the affected hip as frequently as 20-30 minutes an hour and then as needed for pain and swelling. Continue to use ice on the hip for pain and swelling from surgery. You may notice swelling that will progress down to the foot and ankle. This is normal after surgery. Elevate the leg when you are not up walking on it.   °· Continue to use the breathing machine which will help keep your temperature down.  It is common for your temperature to cycle up and down following surgery, especially at night when you are not up moving around and exerting yourself.  The breathing machine keeps your lungs expanded and your temperature down. ° °DIET °You may resume your previous home diet once your are discharged from the hospital. ° °DRESSING / WOUND CARE / SHOWERING °• You have an adhesive waterproof bandage  over the incision. Leave this in place until your first follow-up appointment. Once you remove this you will not need to place another bandage.  °• You may begin showering 3 days following surgery, but do not submerge the incision under water. ° °ACTIVITY °• For the first 3-5 days, it is important to rest and keep the operative leg elevated. You should, as a general rule, rest for 50 minutes and walk/stretch for 10 minutes per hour. After 5 days, you may slowly increase activity as tolerated.  °• Perform the exercises you were provided twice a day for about 15-20 minutes each session. Begin these 2 days following surgery. °• Walk with your walker as instructed. Use the walker until you are comfortable transitioning to a cane. Walk with the cane in the opposite hand of the operative leg. You may discontinue the cane once you are comfortable and walking steadily. °• Avoid periods of inactivity such as sitting longer than an hour when not asleep. This helps prevent blood clots.  °• Do not drive a car for 6 weeks or until released by your surgeon.  °• Do not drive while taking narcotics. ° °TED HOSE STOCKINGS °Wear the elastic stockings on both legs for three weeks following surgery during the day. You may remove them at night while sleeping. ° °WEIGHT BEARING °Weight bearing as tolerated with assist device (walker, cane, etc) as directed, use it as long as suggested by your surgeon or therapist, typically at least 4-6 weeks. ° °POSTOPERATIVE CONSTIPATION PROTOCOL °Constipation - defined medically as fewer than three   stools per week and severe constipation as less than one stool per week. ° °One of the most common issues patients have following surgery is constipation.  Even if you have a regular bowel pattern at home, your normal regimen is likely to be disrupted due to multiple reasons following surgery.  Combination of anesthesia, postoperative narcotics, change in appetite and fluid intake all can affect your  bowels.  In order to avoid complications following surgery, here are some recommendations in order to help you during your recovery period. ° °• Colace (docusate) - Pick up an over-the-counter form of Colace or another stool softener and take twice a day as long as you are requiring postoperative pain medications.  Take with a full glass of water daily.  If you experience loose stools or diarrhea, hold the colace until you stool forms back up.  If your symptoms do not get better within 1 week or if they get worse, check with your doctor. °• Dulcolax (bisacodyl) - Pick up over-the-counter and take as directed by the product packaging as needed to assist with the movement of your bowels.  Take with a full glass of water.  Use this product as needed if not relieved by Colace only.  °• MiraLax (polyethylene glycol) - Pick up over-the-counter to have on hand.  MiraLax is a solution that will increase the amount of water in your bowels to assist with bowel movements.  Take as directed and can mix with a glass of water, juice, soda, coffee, or tea.  Take if you go more than two days without a movement.Do not use MiraLax more than once per day. Call your doctor if you are still constipated or irregular after using this medication for 7 days in a row. ° °If you continue to have problems with postoperative constipation, please contact the office for further assistance and recommendations.  If you experience "the worst abdominal pain ever" or develop nausea or vomiting, please contact the office immediatly for further recommendations for treatment. ° °ITCHING ° If you experience itching with your medications, try taking only a single pain pill, or even half a pain pill at a time.  You can also use Benadryl over the counter for itching or also to help with sleep.  ° °MEDICATIONS °See your medication summary on the “After Visit Summary” that the nursing staff will review with you prior to discharge.  You may have some home  medications which will be placed on hold until you complete the course of blood thinner medication.  It is important for you to complete the blood thinner medication as prescribed by your surgeon.  Continue your approved medications as instructed at time of discharge. ° °PRECAUTIONS °If you experience chest pain or shortness of breath - call 911 immediately for transfer to the hospital emergency department.  °If you develop a fever greater that 101 F, purulent drainage from wound, increased redness or drainage from wound, foul odor from the wound/dressing, or calf pain - CONTACT YOUR SURGEON.   °                                                °FOLLOW-UP APPOINTMENTS °Make sure you keep all of your appointments after your operation with your surgeon and caregivers. You should call the office at the above phone number and make an appointment for approximately two   weeks after the date of your surgery or on the date instructed by your surgeon outlined in the "After Visit Summary". ° °RANGE OF MOTION AND STRENGTHENING EXERCISES  °These exercises are designed to help you keep full movement of your hip joint. Follow your caregiver's or physical therapist's instructions. Perform all exercises about fifteen times, three times per day or as directed. Exercise both hips, even if you have had only one joint replacement. These exercises can be done on a training (exercise) mat, on the floor, on a table or on a bed. Use whatever works the best and is most comfortable for you. Use music or television while you are exercising so that the exercises are a pleasant break in your day. This will make your life better with the exercises acting as a break in routine you can look forward to.  °• Lying on your back, slowly slide your foot toward your buttocks, raising your knee up off the floor. Then slowly slide your foot back down until your leg is straight again.  °• Lying on your back spread your legs as far apart as you can without  causing discomfort.  °• Lying on your side, raise your upper leg and foot straight up from the floor as far as is comfortable. Slowly lower the leg and repeat.  °• Lying on your back, tighten up the muscle in the front of your thigh (quadriceps muscles). You can do this by keeping your leg straight and trying to raise your heel off the floor. This helps strengthen the largest muscle supporting your knee.  °• Lying on your back, tighten up the muscles of your buttocks both with the legs straight and with the knee bent at a comfortable angle while keeping your heel on the floor.  ° °IF YOU ARE TRANSFERRED TO A SKILLED REHAB FACILITY °If the patient is transferred to a skilled rehab facility following release from the hospital, a list of the current medications will be sent to the facility for the patient to continue.  When discharged from the skilled rehab facility, please have the facility set up the patient's Home Health Physical Therapy prior to being released. Also, the skilled facility will be responsible for providing the patient with their medications at time of release from the facility to include their pain medication, the muscle relaxants, and their blood thinner medication. If the patient is still at the rehab facility at time of the two week follow up appointment, the skilled rehab facility will also need to assist the patient in arranging follow up appointment in our office and any transportation needs. ° °MAKE SURE YOU:  °• Understand these instructions.  °• Get help right away if you are not doing well or get worse.  ° ° °DENTAL ANTIBIOTICS: ° °In most cases prophylactic antibiotics for Dental procdeures after total joint surgery are not necessary. ° °Exceptions are as follows: ° °1. History of prior total joint infection ° °2. Severely immunocompromised (Organ Transplant, cancer chemotherapy, Rheumatoid biologic °meds such as Humera) ° °3. Poorly controlled diabetes (A1C &gt; 8.0, blood glucose over  200) ° °If you have one of these conditions, contact your surgeon for an antibiotic prescription, prior to your °dental procedure.  ° ° °Pick up stool softner and laxative for home use following surgery while on pain medications. °Do not submerge incision under water. °Please use good hand washing techniques while changing dressing each day. °May shower starting three days after surgery. °Please use a clean towel to pat   the incision dry following showers. °Continue to use ice for pain and swelling after surgery. °Do not use any lotions or creams on the incision until instructed by your surgeon. ° °Information on my medicine - XARELTO® (Rivaroxaban) ° °Why was Xarelto® prescribed for you? °Xarelto® was prescribed for you to reduce the risk of blood clots forming after orthopedic surgery. The medical term for these abnormal blood clots is venous thromboembolism (VTE). ° °What do you need to know about xarelto® ? °Take your Xarelto® ONCE DAILY at the same time every day. °You may take it either with or without food. ° °If you have difficulty swallowing the tablet whole, you may crush it and mix in applesauce just prior to taking your dose. ° °Take Xarelto® exactly as prescribed by your doctor and DO NOT stop taking Xarelto® without talking to the doctor who prescribed the medication.  Stopping without other VTE prevention medication to take the place of Xarelto® may increase your risk of developing a clot. ° °After discharge, you should have regular check-up appointments with your healthcare provider that is prescribing your Xarelto®.   ° °What do you do if you miss a dose? °If you miss a dose, take it as soon as you remember on the same day then continue your regularly scheduled once daily regimen the next day. Do not take two doses of Xarelto® on the same day.  ° °Important Safety Information °A possible side effect of Xarelto® is bleeding. You should call your healthcare provider right away if you experience any of  the following: °? Bleeding from an injury or your nose that does not stop. °? Unusual colored urine (red or dark brown) or unusual colored stools (red or black). °? Unusual bruising for unknown reasons. °? A serious fall or if you hit your head (even if there is no bleeding). ° °Some medicines may interact with Xarelto® and might increase your risk of bleeding while on Xarelto®. To help avoid this, consult your healthcare provider or pharmacist prior to using any new prescription or non-prescription medications, including herbals, vitamins, non-steroidal anti-inflammatory drugs (NSAIDs) and supplements. ° °This website has more information on Xarelto®: www.xarelto.com. ° ° ° ° °

## 2019-11-12 NOTE — Op Note (Signed)
OPERATIVE REPORT- TOTAL HIP ARTHROPLASTY   PREOPERATIVE DIAGNOSIS: Osteoarthritis of the Right hip.   POSTOPERATIVE DIAGNOSIS: Osteoarthritis of the Right  hip.   PROCEDURE: Right total hip arthroplasty, anterior approach.   SURGEON: Gaynelle Arabian, MD   ASSISTANT: Griffith Citron, PA-C  ANESTHESIA:  Spinal  ESTIMATED BLOOD LOSS:-150 mL    DRAINS: Hemovac x1.   COMPLICATIONS: None   CONDITION: PACU - hemodynamically stable.   BRIEF CLINICAL NOTE: Rebecca [name] is a 74 y.o. female who has advanced end-  stage arthritis of their Right  hip with progressively worsening pain and  dysfunction.The patient has failed nonoperative management and presents for  total hip arthroplasty.   PROCEDURE IN DETAIL: After successful administration of spinal  anesthetic, the traction boots for the [facility] bed were placed on both  feet and the patient was placed onto the [facility] bed, boots placed into the leg  holders. The Right hip was then isolated from the perineum with plastic  drapes and prepped and draped in the usual sterile fashion. ASIS and  greater trochanter were marked and a oblique incision was made, starting  at about 1 cm lateral and 2 cm distal to the ASIS and coursing towards  the anterior cortex of the femur. The skin was cut with a 10 blade  through subcutaneous tissue to the level of the fascia overlying the  tensor fascia lata muscle. The fascia was then incised in line with the  incision at the junction of the anterior third and posterior 2/3rd. The  muscle was teased off the fascia and then the interval between the TFL  and the rectus was developed. The Hohmann retractor was then placed at  the top of the femoral neck over the capsule. The vessels overlying the  capsule were cauterized and the fat on top of the capsule was removed.  A Hohmann retractor was then placed anterior underneath the rectus  femoris to give exposure to the entire anterior capsule. A T-shaped   capsulotomy was performed. The edges were tagged and the femoral head  was identified.       Osteophytes are removed off the superior acetabulum.  The femoral neck was then cut in situ with an oscillating saw. Traction  was then applied to the left lower extremity utilizing the [facility]  traction. The femoral head was then removed. Retractors were placed  around the acetabulum and then circumferential removal of the labrum was  performed. Osteophytes were also removed. Reaming starts at 45 mm to  medialize and  Increased in 2 mm increments to 47 mm. We reamed in  approximately 40 degrees of abduction, 20 degrees anteversion. A 48 mm  pinnacle acetabular shell was then impacted in anatomic position under  fluoroscopic guidance with excellent purchase. We did not need to place  any additional dome screws. A 28 mm neutral + 4 marathon liner was then  placed into the acetabular shell.       The femoral lift was then placed along the lateral aspect of the femur  just distal to the vastus ridge. The leg was  externally rotated and capsule  was stripped off the inferior aspect of the femoral neck down to the  level of the lesser trochanter, this was done with electrocautery. The femur was lifted after this was performed. The  leg was then placed in an extended and adducted position essentially delivering the femur. We also removed the capsule superiorly and the piriformis from the piriformis  fossa to gain excellent exposure of the  proximal femur. Rongeur was used to remove some cancellous bone to get  into the lateral portion of the proximal femur for placement of the  initial starter reamer. The starter broaches was placed  the starter broach  and was shown to go down the center of the canal. Broaching  with the Actis system was then performed starting at size 0  coursing  Up to size 3. A size 3 had excellent torsional and rotational  and axial stability. The trial standard offset neck was then  placed  with a 28 + 1.5 trial head. The hip was then reduced. We confirmed that  the stem was in the canal both on AP and lateral x-rays. It also has excellent sizing. The hip was reduced with outstanding stability through full extension and full external rotation.. AP pelvis was taken and the leg lengths were measured and found to be equal. Hip was then dislocated again and the femoral head and neck removed. The  femoral broach was removed. Size 3 Actis stem with a standard offset  neck was then impacted into the femur following native anteversion. Has  excellent purchase in the canal. Excellent torsional and rotational and  axial stability. It is confirmed to be in the canal on AP and lateral  fluoroscopic views. The 28 + 1.5 metal head was placed and the hip  reduced with outstanding stability. Again AP pelvis was taken and it  confirmed that the leg lengths were equal. The wound was then copiously  irrigated with saline solution and the capsule reattached and repaired  with Ethibond suture. 30 ml of .25% Bupivicaine was  injected into the capsule and into the edge of the tensor fascia lata as well as subcutaneous tissue. The fascia overlying the tensor fascia lata was then closed with a running #1 V-Loc. Subcu was closed with interrupted 2-0 Vicryl and subcuticular running 4-0 Monocryl. Incision was cleaned  and dried. Steri-Strips and a bulky sterile dressing applied. Hemovac  drain was hooked to suction and then the patient was awakened and transported to  recovery in stable condition.        Please note that a surgical assistant was a medical necessity for this procedure to perform it in a safe and expeditious manner. Assistant was necessary to provide appropriate retraction of vital neurovascular structures and to prevent femoral fracture and allow for anatomic placement of the prosthesis.  Gaynelle Arabian, M.D.

## 2019-11-12 NOTE — Anesthesia Procedure Notes (Signed)
Procedure Name: MAC Date/Time: 11/12/2019 8:52 AM Performed by: Niel Hummer, CRNA Pre-anesthesia Checklist: Patient identified, Patient being monitored, Suction available and Emergency Drugs available Oxygen Delivery Method: Simple face mask

## 2019-11-12 NOTE — Plan of Care (Signed)
  Problem: Education: Goal: Knowledge of the prescribed therapeutic regimen will improve Outcome: Progressing   Problem: Pain Management: Goal: Pain level will decrease with appropriate interventions Outcome: Progressing   

## 2019-11-12 NOTE — Transfer of Care (Signed)
Immediate Anesthesia Transfer of Care Note  Patient: Rebecca [name]  Procedure(s) Performed: TOTAL HIP ARTHROPLASTY ANTERIOR APPROACH (Right Hip)  Patient Location: PACU  Anesthesia Type:Spinal  Level of Consciousness: awake, alert  and oriented  Airway & Oxygen Therapy: Patient Spontanous Breathing and Patient connected to face mask oxygen  Post-op Assessment: Report given to RN, Post -op Vital signs reviewed and stable and Patient moving all extremities X 4  Post vital signs: Reviewed and stable  Last Vitals:  Vitals Value Taken Time  BP    Temp    Pulse 70 11/12/19 1030  Resp 15 11/12/19 1030  SpO2 94 % 11/12/19 1030  Vitals shown include unvalidated device data.  Last Pain:  Vitals:   11/12/19 0710  TempSrc:   PainSc: 5       Patients Stated Pain Goal: 4 ([id number])  Complications: No apparent anesthesia complications

## 2019-11-12 NOTE — Anesthesia Procedure Notes (Signed)
Spinal  Patient location during procedure: OR Start time: 11/12/2019 8:53 AM End time: 11/12/2019 8:57 AM Staffing Performed: resident/CRNA  Resident/CRNA: Niel Hummer, CRNA Preanesthetic Checklist Completed: patient identified, IV checked, risks and benefits discussed, surgical consent, monitors and equipment checked and pre-op evaluation Spinal Block Patient position: sitting Prep: DuraPrep Patient monitoring: heart rate, blood pressure and continuous pulse ox Approach: midline Location: L3-4 Injection technique: single-shot Needle Needle type: Pencan  Needle gauge: 24 G Needle length: 9 cm

## 2019-11-12 NOTE — Care Plan (Signed)
Ortho Bundle Case Management Note  Patient Details  Name: Rebecca [name] MRN: [id number] Date of Birth: 1946-01-29  L THA on 11-12-19 DCP:  Home with spouse. 2 story home with 3 ste.  DME:  No needs.  Has a RW and elevated toilets with grab bars. PT:  HEP                   DME Arranged:  N/A DME Agency:  NA  HH Arranged:  NA HH Agency:  NA  Additional Comments: Please contact me with any questions of if this plan should need to change.  Marianne Sofia, RN,CCM [facility]  [phone] 11/12/2019, 7:41 AM

## 2019-11-12 NOTE — Anesthesia Postprocedure Evaluation (Signed)
Anesthesia Post Note  Patient: Rebecca [name]  Procedure(s) Performed: TOTAL HIP ARTHROPLASTY ANTERIOR APPROACH (Right Hip)     Patient location during evaluation: PACU Anesthesia Type: MAC Level of consciousness: awake and alert Pain management: pain level controlled Vital Signs Assessment: post-procedure vital signs reviewed and stable Respiratory status: spontaneous breathing, nonlabored ventilation, respiratory function stable and patient connected to nasal cannula oxygen Cardiovascular status: stable and blood pressure returned to baseline Postop Assessment: no apparent nausea or vomiting Anesthetic complications: no    Last Vitals:  Vitals:   11/12/19 1130 11/12/19 1230  BP: (!) 98/50   Pulse: (!) 54   Resp: 14   Temp:  (!) 36.2 C  SpO2: 100%     Last Pain:  Vitals:   11/12/19 1200  TempSrc:   PainSc: 0-No pain                 Rolin Schult

## 2019-11-13 ENCOUNTER — Encounter (HOSPITAL_COMMUNITY): Payer: Medicare Other

## 2019-11-13 ENCOUNTER — Encounter (HOSPITAL_COMMUNITY): Admission: RE | Admit: 2019-11-13 | Payer: Medicare Other | Source: Ambulatory Visit

## 2019-11-13 ENCOUNTER — Encounter: Payer: Self-pay | Admitting: *Deleted

## 2019-11-13 DIAGNOSIS — M1611 Unilateral primary osteoarthritis, right hip: Secondary | ICD-10-CM | POA: Diagnosis not present

## 2019-11-13 DIAGNOSIS — I110 Hypertensive heart disease with heart failure: Secondary | ICD-10-CM | POA: Diagnosis not present

## 2019-11-13 DIAGNOSIS — M810 Age-related osteoporosis without current pathological fracture: Secondary | ICD-10-CM | POA: Diagnosis not present

## 2019-11-13 DIAGNOSIS — I5032 Chronic diastolic (congestive) heart failure: Secondary | ICD-10-CM | POA: Diagnosis not present

## 2019-11-13 DIAGNOSIS — E119 Type 2 diabetes mellitus without complications: Secondary | ICD-10-CM | POA: Diagnosis not present

## 2019-11-13 DIAGNOSIS — I2510 Atherosclerotic heart disease of native coronary artery without angina pectoris: Secondary | ICD-10-CM | POA: Diagnosis not present

## 2019-11-13 LAB — BASIC METABOLIC PANEL
Anion gap: 11 (ref 5–15)
BUN: 21 mg/dL (ref 8–23)
CO2: 17 mmol/L — ABNORMAL LOW (ref 22–32)
Calcium: 8.5 mg/dL — ABNORMAL LOW (ref 8.9–10.3)
Chloride: 106 mmol/L (ref 98–111)
Creatinine, Ser: 0.67 mg/dL (ref 0.44–1.00)
GFR calc Af Amer: >60 mL/min (ref >60)
GFR calc non Af Amer: >60 mL/min (ref >60)
Glucose, Bld: 146 mg/dL — ABNORMAL HIGH (ref 70–99)
Potassium: 4.3 mmol/L (ref 3.5–5.1)
Sodium: 134 mmol/L — ABNORMAL LOW (ref 135–145)

## 2019-11-13 LAB — CBC
HCT: 40.6 % (ref 36.0–46.0)
Hemoglobin: 13.1 g/dL (ref 12.0–15.0)
MCH: 31.4 pg (ref 26.0–34.0)
MCHC: 32.3 g/dL (ref 30.0–36.0)
MCV: 97.4 fL (ref 80.0–100.0)
Platelets: 225 10*3/uL (ref 150–400)
RBC: 4.17 MIL/uL (ref 3.87–5.11)
RDW: 12.1 % (ref 11.5–15.5)
WBC: 12.2 10*3/uL — ABNORMAL HIGH (ref 4.0–10.5)
nRBC: 0 % (ref 0.0–0.2)

## 2019-11-13 MED ORDER — TRAMADOL HCL 50 MG PO TABS: 50.0000 mg | ORAL_TABLET | Freq: Four times a day (QID) | ORAL | 0 refills | Status: DC | PRN

## 2019-11-13 MED ORDER — METHOCARBAMOL 500 MG PO TABS: 500.0000 mg | ORAL_TABLET | Freq: Four times a day (QID) | ORAL | 0 refills | Status: DC | PRN

## 2019-11-13 MED ORDER — OXYCODONE HCL 5 MG PO TABS: 5.0000 mg | ORAL_TABLET | Freq: Four times a day (QID) | ORAL | 0 refills | Status: DC | PRN

## 2019-11-13 MED ORDER — RIVAROXABAN 10 MG PO TABS: 10.0000 mg | ORAL_TABLET | Freq: Every day | ORAL | 0 refills | Status: DC

## 2019-11-13 NOTE — TOC Transition Note (Signed)
Transition of Care [facility]) - CM/SW Discharge Note   Patient Details  Name: Rebecca [name] MRN: [id number] Date of Birth: 1945-12-11  Transition of [facility]) CM/SW Contact:  Lia Hopping, [place] Phone Number: 11/13/2019, 10:01 AM   Clinical Narrative:    [facility] Plan of care-CSW confirm patient aware of the plan.  -see CM note.        Patient Goals and CMS Choice        Discharge Placement                       Discharge Plan and Services                DME Arranged: N/A DME Agency: NA       HH Arranged: NA HH Agency: NA        Social Determinants of Health (SDOH) Interventions     Readmission Risk Interventions No flowsheet data found.

## 2019-11-13 NOTE — Progress Notes (Signed)
   Subjective: 1 Day Post-Op Procedure(s) (LRB): TOTAL HIP ARTHROPLASTY ANTERIOR APPROACH (Right) Patient reports pain as mild.   Patient seen in rounds with Dr. Wynelle Link. Patient is well, and has had no acute complaints or problems other than pain in the right hip. Denies chest pain or SOB. Had higher levels of pain last night, which is improved this AM. Discussed that this was likely due to the block wearing off. Foley catheter to be removed. We will continue therapy today, ambulated 77' yesterday.  Objective: Vital signs in last 24 hours: Temp:  [97.2 F (36.2 C)-98.2 F (36.8 C)] 97.7 F (36.5 C) (04/29 0628) Pulse Rate:  [50-74] 68 (04/29 0628) Resp:  [10-18] 16 (04/29 0628) BP: (98-130)/(50-75) 108/51 (04/29 0628) SpO2:  [94 %-100 %] 97 % (04/29 0628)  Intake/Output from previous day:  Intake/Output Summary (Last 24 hours) at 11/13/2019 0744 Last data filed at 11/13/2019 [id number] Gross per 24 hour  Intake 3414.93 ml  Output 3900 ml  Net -485.07 ml     Intake/Output this shift: No intake/output data recorded.  Labs: Recent Labs    11/13/19 0304  HGB 13.1   Recent Labs    11/13/19 0304  WBC 12.2*  RBC 4.17  HCT 40.6  PLT 225   Recent Labs    11/13/19 0304  NA 134*  K 4.3  CL 106  CO2 17*  BUN 21  CREATININE 0.67  GLUCOSE 146*  CALCIUM 8.5*   Recent Labs    11/10/19 1549  INR 0.9    Exam: General - Patient is Alert and Oriented Extremity - Neurologically intact Neurovascular intact Sensation intact distally Dorsiflexion/Plantar flexion intact Dressing - dressing C/D/I Motor Function - intact, moving foot and toes well on exam.   Past Medical History:  Diagnosis Date  . Asthma    extrinsic  . CAD (coronary artery disease)   . Chest pain, atypical   . Complication of anesthesia   . Diabetes ([place])   . Edema   . Fatigue   . Fatigue   . GERD (gastroesophageal reflux disease)   . Hyperlipidemia   . Hypertension    familiar  .  Hypothyroidism   . IBS (irritable bowel syndrome)    hx  . Keratosis, seborrheic   . Labyrinthitis   . MI (myocardial infarction) ([place])    age 74  . Osteoarthritis   . PONV (postoperative nausea and vomiting)   . S/P arthroscopy of right shoulder 03/01/2018  . Skin cancer    Melanoma     Assessment/Plan: 1 Day Post-Op Procedure(s) (LRB): TOTAL HIP ARTHROPLASTY ANTERIOR APPROACH (Right) Principal Problem:   OA (osteoarthritis) of hip Active Problems:   Primary osteoarthritis of right hip  Estimated body mass index is 29.09 kg/m as calculated from the following:   Height as of this encounter: 4\' 10"  (1.473 m).   Weight as of this encounter: 63.1 kg. Advance diet Up with therapy D/C IV fluids  DVT Prophylaxis - Xarelto Weight bearing as tolerated. D/C O2 and pulse ox and try on room air. Will continue therapy.  Plan is to go Home after hospital stay. Plan for discharge later today with HEP if meeting goals with physical therapy. Follow-up in the office on May 11.   Theresa Duty, PA-C Orthopedic Surgery [phone] 11/13/2019, 7:44 AM

## 2019-11-13 NOTE — Progress Notes (Signed)
Physical Therapy Treatment Patient Details Name: Rebecca [name] MRN: [id number] DOB: 1946-01-21 Today's Date: 11/13/2019    History of Present Illness Patient is 74 y.o. female s/p Rt THA anterior approach with PMH significant for OA, MI, hypothyroidism, HTN, HLD, GERD, DM, CAD, asthma, bil TKA.    PT Comments    Pt motivated and progressing well with mobility.   Follow Up Recommendations  Follow surgeon's recommendation for DC plan and follow-up therapies     Equipment Recommendations  None recommended by PT    Recommendations for Other Services       Precautions / Restrictions Precautions Precautions: Fall Restrictions Weight Bearing Restrictions: No Other Position/Activity Restrictions: WBAT    Mobility  Bed Mobility Overal bed mobility: Needs Assistance Bed Mobility: Supine to Sit     Supine to sit: Min guard     General bed mobility comments: cues for sequence and use of L LE to self assist  Transfers Overall transfer level: Needs assistance Equipment used: Rolling walker (2 wheeled) Transfers: Sit to/from Stand Sit to Stand: Min guard         General transfer comment: cues for LE management and use of UEs to self assist  Ambulation/Gait Ambulation/Gait assistance: Min assist;Min guard Gait Distance (Feet): 300 Feet Assistive device: Rolling walker (2 wheeled) Gait Pattern/deviations: Step-to pattern;Step-through pattern;Decreased stride length;Decreased weight shift to right Gait velocity: decreased   General Gait Details: cues for posture, position from RW and initial sequence   Stairs             Wheelchair Mobility    Modified Rankin (Stroke Patients Only)       Balance Overall balance assessment: Needs assistance Sitting-balance support: Feet supported Sitting balance-Leahy Scale: Good     Standing balance support: During functional activity;Bilateral upper extremity supported Standing balance-Leahy Scale: Fair                               Cognition Arousal/Alertness: Awake/alert Behavior During Therapy: WFL for tasks assessed/performed Overall Cognitive Status: Within Functional Limits for tasks assessed                                        Exercises Total Joint Exercises Ankle Circles/Pumps: AROM;Both;20 reps;Seated Quad Sets: AROM;Both;10 reps;Supine Heel Slides: AAROM;Right;20 reps;Supine Hip ABduction/ADduction: AAROM;Right;15 reps;Supine Long Arc Quad: AROM;10 reps;Right;Seated    General Comments        Pertinent Vitals/Pain Pain Assessment: 0-10 Pain Score: 4  Pain Location: Rt hip Pain Descriptors / Indicators: Aching;Burning Pain Intervention(s): Monitored during session;Limited activity within patient's tolerance;Premedicated before session;Ice applied    Home Living                      Prior Function            PT Goals (current goals can now be found in the care plan section) Acute Rehab PT Goals Patient Stated Goal: to get recoverd and back to walking and yoga PT Goal Formulation: With patient Time For Goal Achievement: 11/19/19 Potential to Achieve Goals: Good Progress towards PT goals: Progressing toward goals    Frequency    7X/week      PT Plan Current plan remains appropriate    Co-evaluation              AM-PAC PT "6 Clicks" Mobility  Outcome Measure  Help needed turning from your back to your side while in a flat bed without using bedrails?: A Little Help needed moving from lying on your back to sitting on the side of a flat bed without using bedrails?: A Little Help needed moving to and from a bed to a chair (including a wheelchair)?: A Little Help needed standing up from a chair using your arms (e.g., wheelchair or bedside chair)?: A Little Help needed to walk in hospital room?: A Little Help needed climbing 3-5 steps with a railing? : A Little 6 Click Score: 18    End of Session Equipment Utilized  During Treatment: Gait belt Activity Tolerance: Patient tolerated treatment well Patient left: in chair;with call bell/phone within reach;with chair alarm set Nurse Communication: Mobility status PT Visit Diagnosis: Muscle weakness (generalized) (M62.81);Difficulty in walking, not elsewhere classified (R26.2)     Time: [id number] PT Time Calculation (min) (ACUTE ONLY): 34 min  Charges:  $Gait Training: 8-22 mins $Therapeutic Exercise: 8-22 mins                     [place] Pager [phone] Office [phone]    [name] 11/13/2019, 4:23 PM

## 2019-11-13 NOTE — Plan of Care (Signed)
  Problem: Education: Goal: Knowledge of the prescribed therapeutic regimen will improve Outcome: Progressing Goal: Understanding of discharge needs will improve Outcome: Progressing Goal: Individualized Educational Video(s) Outcome: Progressing   Problem: Activity: Goal: Ability to avoid complications of mobility impairment will improve Outcome: Progressing Goal: Ability to tolerate increased activity will improve Outcome: Progressing   Problem: Clinical Measurements: Goal: Postoperative complications will be avoided or minimized Outcome: Progressing   Problem: Pain Management: Goal: Pain level will decrease with appropriate interventions Outcome: Progressing   Problem: Skin Integrity: Goal: Will show signs of wound healing Outcome: Progressing   Problem: Education: Goal: Knowledge of General Education information will improve Description: Including pain rating scale, medication(s)/side effects and non-pharmacologic comfort measures Outcome: Progressing   Problem: Health Behavior/Discharge Planning: Goal: Ability to manage health-related needs will improve Outcome: Progressing   Problem: Clinical Measurements: Goal: Ability to maintain clinical measurements within normal limits will improve Outcome: Progressing Goal: Will remain free from infection Outcome: Progressing Goal: Diagnostic test results will improve Outcome: Progressing Goal: Respiratory complications will improve Outcome: Progressing Goal: Cardiovascular complication will be avoided Outcome: Progressing   Problem: Nutrition: Goal: Adequate nutrition will be maintained Outcome: Progressing   Problem: Coping: Goal: Level of anxiety will decrease Outcome: Progressing   Problem: Activity: Goal: Risk for activity intolerance will decrease Outcome: Progressing   Problem: Elimination: Goal: Will not experience complications related to bowel motility Outcome: Progressing Goal: Will not experience  complications related to urinary retention Outcome: Progressing   Problem: Pain Managment: Goal: General experience of comfort will improve Outcome: Progressing   Problem: Safety: Goal: Ability to remain free from injury will improve Outcome: Progressing   Problem: Skin Integrity: Goal: Risk for impaired skin integrity will decrease Outcome: Progressing

## 2019-11-13 NOTE — Progress Notes (Signed)
Physical Therapy Treatment Patient Details Name: Rebecca [name] MRN: [id number] DOB: 05/21/1946 Today's Date: 11/13/2019    History of Present Illness Patient is 74 y.o. female s/p Rt THA anterior approach with PMH significant for OA, MI, hypothyroidism, HTN, HLD, GERD, DM, CAD, asthma, bil TKA.    PT Comments    Pt continues motivated and progressing well with mobility.  Pt reviewed car transfers as well as HEP and stairs with written instruction provided.  Pt eager for dc home.   Follow Up Recommendations  Follow surgeon's recommendation for DC plan and follow-up therapies     Equipment Recommendations  None recommended by PT    Recommendations for Other Services       Precautions / Restrictions Precautions Precautions: Fall Restrictions Weight Bearing Restrictions: No Other Position/Activity Restrictions: WBAT    Mobility  Bed Mobility Overal bed mobility: Needs Assistance Bed Mobility: Sit to Supine     Supine to sit: Min guard Sit to supine: Min guard   General bed mobility comments: cues for sequence and use of L LE to self assist  Transfers Overall transfer level: Needs assistance Equipment used: Rolling walker (2 wheeled) Transfers: Sit to/from Stand Sit to Stand: Min guard;Supervision         General transfer comment: cues for LE management and use of UEs to self assist  Ambulation/Gait Ambulation/Gait assistance: Min guard;Supervision Gait Distance (Feet): 250 Feet Assistive device: Rolling walker (2 wheeled) Gait Pattern/deviations: Step-to pattern;Step-through pattern;Decreased stride length;Decreased weight shift to right Gait velocity: decreased   General Gait Details: cues for posture, position from RW and initial sequence   Stairs Stairs: Yes Stairs assistance: Min assist Stair Management: One rail Left;Step to pattern;Forwards;With cane Number of Stairs: 7 General stair comments: cues for sequence and foot/QC  placement   Wheelchair Mobility    Modified Rankin (Stroke Patients Only)       Balance Overall balance assessment: Needs assistance Sitting-balance support: Feet supported Sitting balance-Leahy Scale: Good     Standing balance support: During functional activity;Bilateral upper extremity supported Standing balance-Leahy Scale: Fair                              Cognition Arousal/Alertness: Awake/alert Behavior During Therapy: WFL for tasks assessed/performed Overall Cognitive Status: Within Functional Limits for tasks assessed                                        Exercises Total Joint Exercises Ankle Circles/Pumps: AROM;Both;20 reps;Seated Quad Sets: AROM;Both;10 reps;Supine Heel Slides: AAROM;Right;Supine;15 reps Hip ABduction/ADduction: AAROM;Right;15 reps;Supine Long Arc Quad: AROM;10 reps;Right;Seated    General Comments        Pertinent Vitals/Pain Pain Assessment: 0-10 Pain Score: 4  Pain Location: Rt hip Pain Descriptors / Indicators: Aching;Burning Pain Intervention(s): Limited activity within patient's tolerance;Monitored during session;Premedicated before session;Ice applied    Home Living                      Prior Function            PT Goals (current goals can now be found in the care plan section) Acute Rehab PT Goals Patient Stated Goal: regain IND PT Goal Formulation: With patient Time For Goal Achievement: 11/19/19 Potential to Achieve Goals: Good Progress towards PT goals: Progressing toward goals    Frequency  7X/week      PT Plan Current plan remains appropriate    Co-evaluation              AM-PAC PT "6 Clicks" Mobility   Outcome Measure  Help needed turning from your back to your side while in a flat bed without using bedrails?: A Little Help needed moving from lying on your back to sitting on the side of a flat bed without using bedrails?: A Little Help needed moving to  and from a bed to a chair (including a wheelchair)?: A Little Help needed standing up from a chair using your arms (e.g., wheelchair or bedside chair)?: A Little Help needed to walk in hospital room?: A Little Help needed climbing 3-5 steps with a railing? : A Little 6 Click Score: 12    End of Session Equipment Utilized During Treatment: Gait belt Activity Tolerance: Patient tolerated treatment well Patient left: in bed;with call bell/phone within reach;with bed alarm set Nurse Communication: Mobility status PT Visit Diagnosis: Muscle weakness (generalized) (M62.81);Difficulty in walking, not elsewhere classified (R26.2)     Time: [id number] PT Time Calculation (min) (ACUTE ONLY): 40 min  Charges:  $Gait Training: 8-22 mins $Therapeutic Exercise: 8-22 mins $Therapeutic Activity: 8-22 mins                     Debe Coder PT Acute Rehabilitation Services Pager [phone] Office [phone]    Rebecca [name] 11/13/2019, 4:28 PM

## 2019-11-17 NOTE — Discharge Summary (Signed)
Physician Discharge Summary   Patient ID: Rebecca [name] MRN: [id number] DOB/AGE: 1946-04-23 74 y.o.  Admit date: 11/12/2019 Discharge date: 11/13/2019  Primary Diagnosis: Osteoarthritis, right hip   Admission Diagnoses:  Past Medical History:  Diagnosis Date  . Asthma    extrinsic  . CAD (coronary artery disease)   . Chest pain, atypical   . Complication of anesthesia   . Diabetes ([place])   . Edema   . Fatigue   . Fatigue   . GERD (gastroesophageal reflux disease)   . Hyperlipidemia   . Hypertension    familiar  . Hypothyroidism   . IBS (irritable bowel syndrome)    hx  . Keratosis, seborrheic   . Labyrinthitis   . MI (myocardial infarction) ([place])    age 21  . Osteoarthritis   . PONV (postoperative nausea and vomiting)   . S/P arthroscopy of right shoulder 03/01/2018  . Skin cancer    Melanoma    Discharge Diagnoses:   Principal Problem:   OA (osteoarthritis) of hip Active Problems:   Primary osteoarthritis of right hip  Estimated body mass index is 29.09 kg/m as calculated from the following:   Height as of this encounter: 4\' 10"  (1.473 m).   Weight as of this encounter: 63.1 kg.  Procedure:  Procedure(s) (LRB): TOTAL HIP ARTHROPLASTY ANTERIOR APPROACH (Right)   Consults: None  HPI: Rebecca [name] is a 74 y.o. female who has advanced end-stage arthritis of their Right  hip with progressively worsening pain and dysfunction.The patient has failed nonoperative management and presents for total hip arthroplasty.   Laboratory Data: Admission on 11/12/2019, Discharged on 11/13/2019  Component Date Value Ref Range Status  . Glucose-Capillary 11/12/2019 126* 70 - 99 mg/dL Final   Glucose reference range applies only to samples taken after fasting for at least 8 hours.  . Glucose-Capillary 11/12/2019 120* 70 - 99 mg/dL Final   Glucose reference range applies only to samples taken after fasting for at least 8 hours.  . WBC 11/13/2019 12.2* 4.0 - 10.5 K/uL Final    . RBC 11/13/2019 4.17  3.87 - 5.11 MIL/uL Final  . Hemoglobin 11/13/2019 13.1  12.0 - 15.0 g/dL Final  . HCT 11/13/2019 40.6  36.0 - 46.0 % Final  . MCV 11/13/2019 97.4  80.0 - 100.0 fL Final  . MCH 11/13/2019 31.4  26.0 - 34.0 pg Final  . MCHC 11/13/2019 32.3  30.0 - 36.0 g/dL Final  . RDW 11/13/2019 12.1  11.5 - 15.5 % Final  . Platelets 11/13/2019 225  150 - 400 K/uL Final  . nRBC 11/13/2019 0.0  0.0 - 0.2 % Final   Performed at [facility], [place] [address] [place], [place] [address]  . Sodium 11/13/2019 134* 135 - 145 mmol/L Final  . Potassium 11/13/2019 4.3  3.5 - 5.1 mmol/L Final  . Chloride 11/13/2019 106  98 - 111 mmol/L Final  . CO2 11/13/2019 17* 22 - 32 mmol/L Final  . Glucose, Bld 11/13/2019 146* 70 - 99 mg/dL Final   Glucose reference range applies only to samples taken after fasting for at least 8 hours.  . BUN 11/13/2019 21  8 - 23 mg/dL Final  . Creatinine, Ser 11/13/2019 0.67  0.44 - 1.00 mg/dL Final  . Calcium 11/13/2019 8.5* 8.9 - 10.3 mg/dL Final  . GFR calc non Af Amer 11/13/2019 >60  >60 mL/min Final  . GFR calc Af Amer 11/13/2019 >60  >60 mL/min Final  . Georgiann Hahn  gap 11/13/2019 11  5 - 15 Final   Performed at [facility], [place] [address] [place], [place] [address]  Hospital Outpatient Visit on 11/10/2019  Component Date Value Ref Range Status  . aPTT 11/10/2019 27  24 - 36 seconds Final   Performed at [facility], [place] [address] [place], [place] [address]  . Prothrombin Time 11/10/2019 12.1  11.4 - 15.2 seconds Final  . INR 11/10/2019 0.9  0.8 - 1.2 Final   Comment: (NOTE) INR goal varies based on device and disease states. Performed at [facility], [place] [address] [place], [place] [address]   . ABO/RH(D) 11/10/2019 A NEG   Final  . Antibody Screen 11/10/2019 NEG   Final  . Sample Expiration 11/10/2019 11/15/2019,2359   Final  . Extend sample reason 11/10/2019    Final                    Value:NO TRANSFUSIONS OR PREGNANCY IN THE PAST 3 MONTHS Performed at [place] [address], [place], [place] [address]   . Glucose-Capillary 11/10/2019 122* 70 - 99 mg/dL Final   Glucose reference range applies only to samples taken after fasting for at least 8 hours.  Marland Kitchen MRSA, PCR 11/10/2019 NEGATIVE  NEGATIVE Final  . Staphylococcus aureus 11/10/2019 NEGATIVE  NEGATIVE Final   Comment: (NOTE) The Xpert SA Assay (FDA approved for NASAL specimens in patients 61 years of age and older), is one component of a comprehensive surveillance program. It is not intended to diagnose infection nor to guide or monitor treatment. Performed at [facility], [place] [address], [place], [place] [address]   Hospital Outpatient Visit on 11/10/2019  Component Date Value Ref Range Status  . SARS Coronavirus 2 11/10/2019 NEGATIVE  NEGATIVE Final   Comment: (NOTE) SARS-CoV-2 target nucleic acids are NOT DETECTED. The SARS-CoV-2 RNA is generally detectable in upper and lower respiratory specimens during the acute phase of infection. Negative results do not preclude SARS-CoV-2 infection, do not rule out co-infections with other pathogens, and should not be used as the sole basis for treatment or other patient management decisions. Negative results must be combined with clinical observations, patient history, and epidemiological information. The expected result is Negative. Fact Sheet for Patients: SugarRoll.be Fact Sheet for Healthcare Providers: https://www.woods-mathews.com/ This test is not yet approved or cleared by the [place] FDA and  has been authorized for detection and/or diagnosis of SARS-CoV-2 by FDA under an Emergency Use Authorization (EUA). This EUA will remain  in effect (meaning this test can be used) for the duration of the COVID-19 declaration under Section 56                           4(b)(1) of the Act, 21 U.S.C. section 360bbb-3(b)(1), unless the authorization is terminated or revoked sooner. Performed at [facility], [place] [address], [place],  [address]   Office Visit on 11/05/2019  Component Date Value Ref Range Status  . Hgb A1c MFr Bld 11/05/2019 6.9* 4.6 - 6.5 % Final   Glycemic Control Guidelines for People with Diabetes:Non Diabetic:  <6%Goal of Therapy: <7%Additional Action Suggested:  >8%   . Cholesterol 11/05/2019 167  0 - 200 mg/dL Final   ATP III Classification       Desirable:  < 200 mg/dL  Borderline High:  200 - 239 mg/dL          High:  > = 240 mg/dL  . Triglycerides 11/05/2019 123.0  0.0 - 149.0 mg/dL Final   Normal:  <150 mg/dLBorderline High:  150 - 199 mg/dL  . HDL 11/05/2019 55.00  >39.00 mg/dL Final  . VLDL 11/05/2019 24.6  0.0 - 40.0 mg/dL Final  . LDL Cholesterol 11/05/2019 87  0 - 99 mg/dL Final  . Total CHOL/HDL Ratio 11/05/2019 3   Final                  Men          Women1/2 Average Risk     3.4          3.3Average Risk          5.0          4.42X Average Risk          9.6          7.13X Average Risk          15.0          11.0                      . NonHDL 11/05/2019 111.61   Final   NOTE:  Non-HDL goal should be 30 mg/dL higher than patient's LDL goal (i.e. LDL goal of < 70 mg/dL, would have non-HDL goal of < 100 mg/dL)  . TSH 11/05/2019 1.56  0.35 - 4.50 uIU/mL Final  . Color, Urine 11/05/2019 YELLOW  Yellow;Lt. Yellow;Straw;Dark Yellow;Amber;Green;Red;Brown Final  . APPearance 11/05/2019 CLEAR  Clear;Turbid;Slightly Cloudy;Cloudy Final  . Specific Gravity, Urine 11/05/2019 1.010  1.000 - 1.030 Final  . pH 11/05/2019 5.5  5.0 - 8.0 Final  . Total Protein, Urine 11/05/2019 NEGATIVE  Negative Final  . Urine Glucose 11/05/2019 >=1000* Negative Final  . Ketones, ur 11/05/2019 15* Negative Final  . Bilirubin Urine 11/05/2019 NEGATIVE  Negative Final  . Hgb urine dipstick 11/05/2019 NEGATIVE  Negative Final  .  Urobilinogen, UA 11/05/2019 0.2  0.0 - 1.0 Final  . Leukocytes,Ua 11/05/2019 NEGATIVE  Negative Final  . Nitrite 11/05/2019 NEGATIVE  Negative Final  . WBC 11/05/2019 5.8  4.0 - 10.5 K/uL Final  . RBC 11/05/2019 4.71  3.87 - 5.11 Mil/uL Final  . Hemoglobin 11/05/2019 14.9  12.0 - 15.0 g/dL Final  . HCT 11/05/2019 45.2  36.0 - 46.0 % Final  . MCV 11/05/2019 96.1  78.0 - 100.0 fl Final  . MCHC 11/05/2019 32.9  30.0 - 36.0 g/dL Final  . RDW 11/05/2019 13.3  11.5 - 15.5 % Final  . Platelets 11/05/2019 274.0  150.0 - 400.0 K/uL Final  . Neutrophils Relative % 11/05/2019 61.1  43.0 - 77.0 % Final  . Lymphocytes Relative 11/05/2019 31.8  12.0 - 46.0 % Final  . Monocytes Relative 11/05/2019 5.8  3.0 - 12.0 % Final  . Eosinophils Relative 11/05/2019 0.8  0.0 - 5.0 % Final  . Basophils Relative 11/05/2019 0.5  0.0 - 3.0 % Final  . Neutro Abs 11/05/2019 3.5  1.4 - 7.7 K/uL Final  . Lymphs Abs 11/05/2019 1.8  0.7 - 4.0 K/uL Final  . Monocytes Absolute 11/05/2019 0.3  0.1 - 1.0 K/uL Final  . Eosinophils Absolute 11/05/2019 0.0  0.0 - 0.7 K/uL Final  . Basophils Absolute 11/05/2019 0.0  0.0 - 0.1 K/uL Final  . Sodium 11/05/2019 133* 135 - 145 mEq/L Final  .  Potassium 11/05/2019 4.6  3.5 - 5.1 mEq/L Final  . Chloride 11/05/2019 97  96 - 112 mEq/L Final  . CO2 11/05/2019 26  19 - 32 mEq/L Final  . Glucose, Bld 11/05/2019 114* 70 - 99 mg/dL Final  . BUN 11/05/2019 22  6 - 23 mg/dL Final  . Creatinine, Ser 11/05/2019 0.81  0.40 - 1.20 mg/dL Final  . Total Bilirubin 11/05/2019 0.6  0.2 - 1.2 mg/dL Final  . Alkaline Phosphatase 11/05/2019 61  39 - 117 U/L Final  . AST 11/05/2019 18  0 - 37 U/L Final  . ALT 11/05/2019 24  0 - 35 U/L Final  . Total Protein 11/05/2019 6.7  6.0 - 8.3 g/dL Final  . Albumin 11/05/2019 4.7  3.5 - 5.2 g/dL Final  . GFR 11/05/2019 68.83  >60.00 mL/min Final  . Calcium 11/05/2019 9.8  8.4 - 10.5 mg/dL Final     X-Rays:DG Pelvis Portable  Result Date: 11/12/2019 CLINICAL  DATA:  Post right hip replacement EXAM: PORTABLE PELVIS 1-2 VIEWS COMPARISON:  None. FINDINGS: Changes of right hip replacement. Normal AP alignment. No hardware bony complicating feature. IMPRESSION: Right hip replacement.  No visible complicating feature. Electronically Signed   By: Rolm Baptise M.D.   On: 11/12/2019 11:22   DG C-Arm 1-60 Min-No Report  Result Date: 11/12/2019 Fluoroscopy was utilized by the requesting physician.  No radiographic interpretation.   DG HIP OPERATIVE UNILAT W OR W/O PELVIS RIGHT  Result Date: 11/12/2019 CLINICAL DATA:  Right hip replacement EXAM: OPERATIVE right HIP (WITH PELVIS IF PERFORMED) 4 VIEWS TECHNIQUE: Fluoroscopic spot image(s) were submitted for interpretation post-operatively. COMPARISON:  None. FINDINGS: Changes of right hip replacement. Normal AP alignment. No hardware bony complicating feature. IMPRESSION: Right hip replacement.  No visible complicating feature. Electronically Signed   By: Rolm Baptise M.D.   On: 11/12/2019 11:23    EKG: Orders placed or performed in visit on 10/29/19  . EKG 12-Lead     Hospital Course: Rebecca [name] is a 74 y.o. who was admitted to [facility]. They were brought to the operating room on 11/12/2019 and underwent Procedure(s): [place].  Patient tolerated the procedure well and was later transferred to the recovery room and then to the orthopaedic floor for postoperative care. They were given PO and IV analgesics for pain control following their surgery. They were given 24 hours of postoperative antibiotics of  Anti-infectives (From admission, onward)   Start     Dose/Rate Route Frequency Ordered Stop   11/12/19 1500  ceFAZolin (ANCEF) IVPB 2g/100 mL premix     2 g 200 mL/hr over 30 Minutes Intravenous Every 6 hours 11/12/19 1401 11/12/19 2131   11/12/19 0646  ceFAZolin (ANCEF) 2-4 GM/100ML-% IVPB    Note to Pharmacy: Kyra Leyland   : cabinet override      11/12/19  0646 11/12/19 0904   11/12/19 0645  ceFAZolin (ANCEF) IVPB 2g/100 mL premix     2 g 200 mL/hr over 30 Minutes Intravenous On call to O.R. 11/12/19 [id number] 11/12/19 0927     and started on DVT prophylaxis in the form of Xarelto.   PT and OT were ordered for total joint protocol. Discharge planning consulted to help with postop disposition and equipment needs.  Patient had a decent night on the evening of surgery. Had higher levels of pain throughout the night, which was most likely due to block wearing off. They started to get up OOB with  therapy on POD #0. Pt was seen during rounds and was ready to go home pending progress with therapy.  She worked with therapy on POD #1 and was meeting her goals. Pain was much improved and well controlled with oral pain medications. Pt was discharged to home later that day in stable condition.  Diet: Cardiac diet Activity: WBAT Follow-up: in 2 weeks Disposition: Home with HEP Discharged Condition: stable   Discharge Instructions    Call MD / Call 911   Complete by: As directed    If you experience chest pain or shortness of breath, CALL 911 and be transported to the hospital emergency room.  If you develope a fever above 101 F, pus (white drainage) or increased drainage or redness at the wound, or calf pain, call your surgeon's office.   Change dressing   Complete by: As directed    You have an adhesive waterproof bandage over the incision. Leave this in place until your first follow-up appointment. Once you remove this you will not need to place another bandage.   Constipation Prevention   Complete by: As directed    Drink plenty of fluids.  Prune juice may be helpful.  You may use a stool softener, such as Colace (over the counter) 100 mg twice a day.  Use MiraLax (over the counter) for constipation as needed.   Diet - low sodium heart healthy   Complete by: As directed    Do not sit on low chairs, stoools or toilet seats, as it may be difficult to get up  from low surfaces   Complete by: As directed    Driving restrictions   Complete by: As directed    No driving for two weeks   TED hose   Complete by: As directed    Use stockings (TED hose) for three weeks on both leg(s).  You may remove them at night for sleeping.   Weight bearing as tolerated   Complete by: As directed      Allergies as of 11/13/2019      Reactions   Sulfa Antibiotics     Kathreen Cosier syndrome   Sulfonamide Derivatives    Kathreen Cosier syndrome      Medication List    STOP taking these medications   aspirin EC 81 MG tablet   calcium carbonate 600 MG Tabs tablet Commonly known as: OS-CAL   Fish Oil 1200 MG Caps   Vitamin D 50 MCG (2000 UT) Caps     TAKE these medications   Accu-Chek FastClix Lancets Misc USE   TO CHECK GLUCOSE TWICE DAILY   Accu-Chek Guide test strip Generic drug: glucose blood Use to test blood glucose twice daily   acetaminophen 325 MG tablet Commonly known as: TYLENOL Take 650 mg by mouth every 6 (six) hours as needed for moderate pain.   ALPRAZolam 0.5 MG tablet Commonly known as: Xanax Take 1 tablet (0.5 mg total) by mouth 2 (two) times daily as needed for anxiety. What changed:   how much to take  when to take this  reasons to take this   atorvastatin 80 MG tablet Commonly known as: LIPITOR TAKE 1 TABLET BY MOUTH  DAILY   conjugated estrogens vaginal cream Commonly known as: PREMARIN Place 0.5 Applicatorfuls vaginally every 7 (seven) days.   denosumab 60 MG/ML Sosy injection Commonly known as: PROLIA Inject 60 mg into the skin every 6 (six) months.   diltiazem 240 MG 24 hr capsule Commonly known as: CARDIZEM CD  TAKE 1 CAPSULE(240 MG) BY MOUTH DAILY What changed: See the new instructions.   Eye Drops 0.012-0.2 % Soln Generic drug: Naphazoline-Polyethyl Glycol Apply 1 drop to eye as needed (dry eyes). Artifical eye drops   fluticasone 44 MCG/ACT inhaler Commonly known as: Flovent HFA Inhale 2  puffs into the lungs 2 (two) times daily.   furosemide 20 MG tablet Commonly known as: LASIX TAKE 1 TABLET BY MOUTH  TWICE DAILY   Jardiance 10 MG Tabs tablet Generic drug: empagliflozin Take 10 mg by mouth daily before breakfast.   levothyroxine 50 MCG tablet Commonly known as: SYNTHROID TAKE 1 TABLET BY MOUTH  DAILY BEFORE BREAKFAST   methocarbamol 500 MG tablet Commonly known as: ROBAXIN Take 1 tablet (500 mg total) by mouth every 6 (six) hours as needed for muscle spasms.   nitroGLYCERIN 0.4 MG SL tablet Commonly known as: NITROSTAT DISSOLVE 1 TABLET UNDER THE TONGUE EVERY 5 MINUTES AS  NEEDED FOR CHEST PAIN. MAX  3 DOSES IN 15 MINUTES. CALL 911 IF CHEST PAIN PERSISTS What changed: See the new instructions.   oxyCODONE 5 MG immediate release tablet Commonly known as: Oxy IR/ROXICODONE Take 1-2 tablets (5-10 mg total) by mouth every 6 (six) hours as needed for moderate pain, severe pain or breakthrough pain.   potassium chloride SA 20 MEQ tablet Commonly known as: KLOR-CON Take 1 tablet (20 mEq total) by mouth 2 (two) times daily. What changed: when to take this   ProAir RespiClick [id number] (90 Base) MCG/ACT Aepb Generic drug: Albuterol Sulfate Inhale 2 puffs into the lungs every 6 (six) hours as needed (for SOB or Wheezing).   rivaroxaban 10 MG Tabs tablet Commonly known as: XARELTO Take 1 tablet (10 mg total) by mouth daily with breakfast for 20 days. Then resume one 81 mg aspirin once a day.   traMADol 50 MG tablet Commonly known as: ULTRAM Take 1-2 tablets (50-100 mg total) by mouth every 6 (six) hours as needed for moderate pain.   traZODone 50 MG tablet Commonly known as: DESYREL TAKE 1/2 TO 1 TABLET BY  MOUTH AT BEDTIME AS NEEDED  FOR SLEEP            Discharge Care Instructions  (From admission, onward)         Start     Ordered   11/13/19 0000  Weight bearing as tolerated     11/13/19 0749   11/13/19 0000  Change dressing    Comments: You have an  adhesive waterproof bandage over the incision. Leave this in place until your first follow-up appointment. Once you remove this you will not need to place another bandage.   11/13/19 0749         Follow-up Information    Gaynelle Arabian, MD. Go on 11/25/2019.   Specialty: Orthopedic Surgery Why: You are scheduled for a post-operative appointment on 11-25-19 at 1:45 pm.  Contact information: [address] [place] [address] [id number]           Signed: Theresa Duty, PA-C Orthopedic Surgery 11/17/2019, 8:11 AM

## 2019-11-20 ENCOUNTER — Other Ambulatory Visit: Payer: Self-pay | Admitting: Adult Health

## 2019-11-26 ENCOUNTER — Other Ambulatory Visit: Payer: Self-pay | Admitting: Physician Assistant

## 2019-11-26 DIAGNOSIS — I2510 Atherosclerotic heart disease of native coronary artery without angina pectoris: Secondary | ICD-10-CM

## 2019-11-26 DIAGNOSIS — I10 Essential (primary) hypertension: Secondary | ICD-10-CM

## 2019-12-12 ENCOUNTER — Encounter: Payer: Medicare Other | Admitting: Adult Health

## 2019-12-18 ENCOUNTER — Other Ambulatory Visit: Payer: Self-pay

## 2019-12-19 ENCOUNTER — Encounter: Payer: Self-pay | Admitting: Adult Health

## 2019-12-19 ENCOUNTER — Ambulatory Visit (INDEPENDENT_AMBULATORY_CARE_PROVIDER_SITE_OTHER): Payer: Medicare Other | Admitting: Adult Health

## 2019-12-19 VITALS — BP 116/68 | Temp 98.0°F | Ht 59.0 in | Wt 136.0 lb

## 2019-12-19 DIAGNOSIS — E782 Mixed hyperlipidemia: Secondary | ICD-10-CM | POA: Diagnosis not present

## 2019-12-19 DIAGNOSIS — M25551 Pain in right hip: Secondary | ICD-10-CM

## 2019-12-19 DIAGNOSIS — E119 Type 2 diabetes mellitus without complications: Secondary | ICD-10-CM | POA: Diagnosis not present

## 2019-12-19 DIAGNOSIS — E039 Hypothyroidism, unspecified: Secondary | ICD-10-CM | POA: Diagnosis not present

## 2019-12-19 DIAGNOSIS — E2839 Other primary ovarian failure: Secondary | ICD-10-CM | POA: Diagnosis not present

## 2019-12-19 DIAGNOSIS — I2510 Atherosclerotic heart disease of native coronary artery without angina pectoris: Secondary | ICD-10-CM | POA: Diagnosis not present

## 2019-12-19 DIAGNOSIS — G479 Sleep disorder, unspecified: Secondary | ICD-10-CM

## 2019-12-19 DIAGNOSIS — I10 Essential (primary) hypertension: Secondary | ICD-10-CM | POA: Diagnosis not present

## 2019-12-19 MED ORDER — ALPRAZOLAM 0.5 MG PO TABS: 0.2500 mg | ORAL_TABLET | Freq: Every evening | ORAL | 0 refills | Status: DC | PRN

## 2019-12-19 MED ORDER — NITROGLYCERIN 0.4 MG SL SUBL: SUBLINGUAL_TABLET | SUBLINGUAL | 6 refills | Status: DC

## 2019-12-19 MED ORDER — ESTROGENS, CONJUGATED 0.625 MG/GM VA CREA: 1.0000 g | TOPICAL_CREAM | VAGINAL | 6 refills | Status: AC

## 2019-12-19 MED ORDER — ACCU-CHEK GUIDE VI STRP: ORAL_STRIP | 3 refills | Status: DC

## 2019-12-19 MED ORDER — ATORVASTATIN CALCIUM 80 MG PO TABS: 80.0000 mg | ORAL_TABLET | Freq: Every day | ORAL | 3 refills | Status: DC

## 2019-12-19 NOTE — Progress Notes (Signed)
Subjective:    Patient ID: Rebecca [name], female    DOB: August 25, 1945, 74 y.o.   MRN: [id number]  HPI Patient presents for yearly preventative medicine examination. She is a pleasant 74 year old female who  has a past medical history of Asthma, CAD (coronary artery disease), Chest pain, atypical, Complication of anesthesia, Diabetes ([place]), Edema, Fatigue, Fatigue, GERD (gastroesophageal reflux disease), Hyperlipidemia, Hypertension, Hypothyroidism, IBS (irritable bowel syndrome), Keratosis, seborrheic, Labyrinthitis, MI (myocardial infarction) ([place]), Osteoarthritis, PONV (postoperative nausea and vomiting), S/P arthroscopy of right shoulder (03/01/2018), and Skin cancer.  Hypothyroidism -well controlled with Synthroid 50 mcg.  DM -currently prescribed Jardiance 10 mg.  She does monitor her blood sugars at home for the most part they are well controlled.  She had an A1c done in April which showed a reading of 6.9.  Hyperlipidemia/CAD -currently prescribed Lipitor 80 mg daily.  She denies myalgia/fatigue/CP  Chronic diastolic CHF-controlled with low-dose Lasix.  Palpitations -he had a 2-week cardiac monitor in February 2020 which showed sinus bradycardia to sinus tachycardia with infrequent PACs, PVCs, and short runs of SVT.  Currently prescribed diltiazem 240 mg  Insomnia - takes tramadol 25 mg QHS and Xanax 0.25 mg ( 1/2 tab) PRN  All immunizations and health maintenance protocols were reviewed with the patient and needed orders were placed. She is up to date on vaccinations   Appropriate screening laboratory values were ordered for the patient including screening of hyperlipidemia, renal function and hepatic function.   Medication reconciliation,  past medical history, social history, problem list and allergies were reviewed in detail with the patient  Goals were established with regard to weight loss, exercise, and  diet in compliance with medications. She is staying active and eats  healthy.  She had a right total hip done at the end of April, had pain inflammation for about 2 weeks afterwards but is currently back to normal activity without pain   Review of Systems  Constitutional: Negative.   HENT: Negative.   Eyes: Negative.   Respiratory: Negative.   Cardiovascular: Negative.   Gastrointestinal: Negative.   Endocrine: Negative.   Genitourinary: Negative.   Musculoskeletal: Negative.   Skin: Negative.   Allergic/Immunologic: Negative.   Neurological: Negative.   Hematological: Negative.   Psychiatric/Behavioral: Positive for sleep disturbance.   Past Medical History:  Diagnosis Date  . Asthma    extrinsic  . CAD (coronary artery disease)   . Chest pain, atypical   . Complication of anesthesia   . Diabetes ([place])   . Edema   . Fatigue   . Fatigue   . GERD (gastroesophageal reflux disease)   . Hyperlipidemia   . Hypertension    familiar  . Hypothyroidism   . IBS (irritable bowel syndrome)    hx  . Keratosis, seborrheic   . Labyrinthitis   . MI (myocardial infarction) ([place])    age 23  . Osteoarthritis   . PONV (postoperative nausea and vomiting)   . S/P arthroscopy of right shoulder 03/01/2018  . Skin cancer    Melanoma     Social History   Socioeconomic History  . Marital status: Married    Spouse name: Not on file  . Number of children: 2  . Years of education: Not on file  . Highest education level: Not on file  Occupational History  . Occupation: RN-retired    Employer: RETIRED  Tobacco Use  . Smoking status: Never Smoker  . Smokeless tobacco: Never Used  Substance and Sexual  Activity  . Alcohol use: No    Comment: rarely  . Drug use: No  . Sexual activity: Not on file  Other Topics Concern  . Not on file  Social History Narrative   ** Merged History Encounter **       Social Determinants of Health   Financial Resource Strain:   . Difficulty of Paying Living Expenses:   Food Insecurity:   . Worried About Paediatric nurse in the Last Year:   . Arboriculturist in the Last Year:   Transportation Needs:   . Film/video editor (Medical):   Marland Kitchen Lack of Transportation (Non-Medical):   Physical Activity:   . Days of Exercise per Week:   . Minutes of Exercise per Session:   Stress:   . Feeling of Stress :   Social Connections:   . Frequency of Communication with Friends and Family:   . Frequency of Social Gatherings with Friends and Family:   . Attends Religious Services:   . Active Member of Clubs or Organizations:   . Attends Archivist Meetings:   Marland Kitchen Marital Status:   Intimate Partner Violence:   . Fear of Current or Ex-Partner:   . Emotionally Abused:   Marland Kitchen Physically Abused:   . Sexually Abused:     Past Surgical History:  Procedure Laterality Date  . Breast Biopsy     dense breast tissue  . BREAST EXCISIONAL BIOPSY Left 2000   benign  . CHOLECYSTECTOMY    . EUS  04/04/2012   Procedure: UPPER ENDOSCOPIC ULTRASOUND (EUS) LINEAR;  Surgeon: Milus Banister, MD;  Location: [facility];  Service: Endoscopy;  Laterality: N/A;  radial linear  . KNEE ARTHROPLASTY     total right  02-28-2010 Dr Pilar Plate Aluisio, left  . KNEE ARTHROPLASTY Bilateral 09/12/2017   Hector Shade  . LEFT HEART CATH AND CORONARY ANGIOGRAPHY N/A 04/20/2017   Procedure: LEFT HEART CATH AND CORONARY ANGIOGRAPHY;  Surgeon: Burnell Blanks, MD;  Location: [facility];  Service: Cardiovascular;  Laterality: N/A;  . TOTAL HIP ARTHROPLASTY Right 11/12/2019   Procedure: TOTAL HIP ARTHROPLASTY ANTERIOR APPROACH;  Surgeon: Gaynelle Arabian, MD;  Location: [facility];  Service: Orthopedics;  Laterality: Right;  129min  . TUBAL LIGATION      Family History  Problem Relation Age of Onset  . Heart disease Mother   . Hypertension Mother   . Heart attack Mother 25       MULTIPLES OVER TIME  . Melanoma Daughter 5       METASTATIC  . Breast cancer Sister   . Hypertension Father   . Hypertension Sister      Allergies  Allergen Reactions  . Sulfa Antibiotics      Kathreen Cosier syndrome  . Sulfonamide Derivatives     Kathreen Cosier syndrome    Current Outpatient Medications on File Prior to Visit  Medication Sig Dispense Refill  . Accu-Chek FastClix Lancets MISC USE   TO CHECK GLUCOSE TWICE DAILY 102 each 3  . ACCU-CHEK GUIDE test strip Use to test blood glucose twice daily 200 each 3  . acetaminophen (TYLENOL) 325 MG tablet Take 650 mg by mouth every 6 (six) hours as needed for moderate pain.     Marland Kitchen ALPRAZolam (XANAX) 0.5 MG tablet Take 1 tablet (0.5 mg total) by mouth 2 (two) times daily as needed for anxiety. (Patient taking differently: Take 0.25-0.5 mg by mouth at bedtime as needed for sleep. )  20 tablet 0  . atorvastatin (LIPITOR) 80 MG tablet TAKE 1 TABLET BY MOUTH  DAILY 90 tablet 3  . conjugated estrogens (PREMARIN) vaginal cream Place 0.5 Applicatorfuls vaginally every 7 (seven) days. 42.5 g 6  . denosumab (PROLIA) 60 MG/ML SOSY injection Inject 60 mg into the skin every 6 (six) months.    . diltiazem (CARDIZEM CD) 240 MG 24 hr capsule TAKE 1 CAPSULE(240 MG) BY MOUTH DAILY 90 capsule 3  . fluticasone (FLOVENT HFA) 44 MCG/ACT inhaler Inhale 2 puffs into the lungs 2 (two) times daily. 1 Inhaler 3  . furosemide (LASIX) 20 MG tablet TAKE 1 TABLET BY MOUTH  TWICE DAILY 180 tablet 0  . JARDIANCE 10 MG TABS tablet TAKE 1 TABLET BY MOUTH  DAILY BEFORE BREAKFAST 90 tablet 1  . levothyroxine (SYNTHROID) 50 MCG tablet TAKE 1 TABLET BY MOUTH  DAILY BEFORE BREAKFAST 90 tablet 0  . methocarbamol (ROBAXIN) 500 MG tablet Take 1 tablet (500 mg total) by mouth every 6 (six) hours as needed for muscle spasms. 40 tablet 0  . Naphazoline-Polyethyl Glycol (EYE DROPS) 0.012-0.2 % SOLN Apply 1 drop to eye as needed (dry eyes). Artifical eye drops    . nitroGLYCERIN (NITROSTAT) 0.4 MG SL tablet DISSOLVE 1 TABLET UNDER THE TONGUE EVERY 5 MINUTES AS  NEEDED FOR CHEST PAIN. MAX  3 DOSES IN 15 MINUTES. CALL  911 IF CHEST PAIN PERSISTS (Patient taking differently: Place 0.4 mg under the tongue every 5 (five) minutes as needed for chest pain. ) 100 tablet 6  . oxyCODONE (OXY IR/ROXICODONE) 5 MG immediate release tablet Take 1-2 tablets (5-10 mg total) by mouth every 6 (six) hours as needed for moderate pain, severe pain or breakthrough pain. 56 tablet 0  . potassium chloride SA (K-DUR) 20 MEQ tablet Take 1 tablet (20 mEq total) by mouth 2 (two) times daily. (Patient taking differently: Take 20 mEq by mouth daily. ) 180 tablet 3  . PROAIR RESPICLICK 161 (90 Base) MCG/ACT AEPB Inhale 2 puffs into the lungs every 6 (six) hours as needed (for SOB or Wheezing). 3 each 0  . rivaroxaban (XARELTO) 10 MG TABS tablet Take 1 tablet (10 mg total) by mouth daily with breakfast for 20 days. Then resume one 81 mg aspirin once a day. 20 tablet 0  . traMADol (ULTRAM) 50 MG tablet Take 1-2 tablets (50-100 mg total) by mouth every 6 (six) hours as needed for moderate pain. 40 tablet 0  . traZODone (DESYREL) 50 MG tablet TAKE 1/2 TO 1 TABLET BY  MOUTH AT BEDTIME AS NEEDED  FOR SLEEP 90 tablet 0   No current facility-administered medications on file prior to visit.    There were no vitals taken for this visit.      Objective:   Physical Exam Vitals and nursing note reviewed.  Constitutional:      General: She is not in acute distress.    Appearance: Normal appearance. She is well-developed. She is not ill-appearing.  HENT:     Head: Normocephalic and atraumatic.     Right Ear: Tympanic membrane, ear canal and external ear normal. There is no impacted cerumen.     Left Ear: Tympanic membrane, ear canal and external ear normal. There is no impacted cerumen.     Nose: Nose normal. No congestion or rhinorrhea.     Mouth/Throat:     Mouth: Mucous membranes are moist.     Pharynx: Oropharynx is clear. No oropharyngeal exudate or posterior oropharyngeal erythema.  Eyes:     General:        Right eye: No discharge.         Left eye: No discharge.     Extraocular Movements: Extraocular movements intact.     Conjunctiva/sclera: Conjunctivae normal.     Pupils: Pupils are equal, round, and reactive to light.  Neck:     Thyroid: No thyromegaly.     Vascular: No carotid bruit.     Trachea: No tracheal deviation.  Cardiovascular:     Rate and Rhythm: Normal rate and regular rhythm.     Pulses: Normal pulses.     Heart sounds: Normal heart sounds. No murmur. No friction rub. No gallop.   Pulmonary:     Effort: Pulmonary effort is normal. No respiratory distress.     Breath sounds: Normal breath sounds. No stridor. No wheezing, rhonchi or rales.  Chest:     Chest wall: No tenderness.  Abdominal:     General: Abdomen is flat. Bowel sounds are normal. There is no distension.     Palpations: Abdomen is soft. There is no mass.     Tenderness: There is no abdominal tenderness. There is no right CVA tenderness, left CVA tenderness, guarding or rebound.     Hernia: No hernia is present.  Musculoskeletal:        General: No swelling, tenderness, deformity or signs of injury. Normal range of motion.     Cervical back: Normal range of motion and neck supple.     Right lower leg: No edema.     Left lower leg: No edema.  Lymphadenopathy:     Cervical: No cervical adenopathy.  Skin:    General: Skin is warm and dry.     Coloration: Skin is not jaundiced or pale.     Findings: No bruising, erythema, lesion or rash.  Neurological:     General: No focal deficit present.     Mental Status: She is alert and oriented to person, place, and time.     Cranial Nerves: No cranial nerve deficit.     Sensory: No sensory deficit.     Motor: No weakness.     Coordination: Coordination normal.     Gait: Gait normal.     Deep Tendon Reflexes: Reflexes normal.  Psychiatric:        Mood and Affect: Mood normal.        Behavior: Behavior normal.        Thought Content: Thought content normal.        Judgment: Judgment  normal.       Assessment & Plan:  She had all of her basic labs done 1 month ago and all were normal.  We did not see any point of redrawing his labs today  1. Hypothyroidism, unspecified type - Continue with synthroid dose  2. Diabetes mellitus without complication ([place]) - Continue with Jardiance 10 mg  - ACCU-CHEK GUIDE test strip; Use to test blood glucose three daily  Dispense: 300 each; Refill: 3  3. Mixed hyperlipidemia  - atorvastatin (LIPITOR) 80 MG tablet; Take 1 tablet (80 mg total) by mouth daily.  Dispense: 90 tablet; Refill: 3  4. Essential hypertension - No change in medications   5. Atherosclerosis of native coronary artery of native heart without angina pectoris  - atorvastatin (LIPITOR) 80 MG tablet; Take 1 tablet (80 mg total) by mouth daily.  Dispense: 90 tablet; Refill: 3 - nitroGLYCERIN (NITROSTAT) 0.4 MG SL tablet; DISSOLVE 1 TABLET  UNDER THE TONGUE EVERY 5 MINUTES AS&nbsp;&nbsp;NEEDED FOR CHEST PAIN. MAX&nbsp;&nbsp;3 DOSES IN 15 MINUTES. CALL 911 IF CHEST PAIN PERSISTS  Dispense: 100 tablet; Refill: 6  6. Right hip pain - Resolved after hip replacement   7. Sleep disturbance - Continue trazodone and xanax PRN  - ALPRAZolam (XANAX) 0.5 MG tablet; Take 0.5-1 tablets (0.25-0.5 mg total) by mouth at bedtime as needed for sleep.  Dispense: 30 tablet; Refill: 0  8. Estrogen deficiency  - conjugated estrogens (PREMARIN) vaginal cream; Place 0.5 Applicatorfuls vaginally every 7 (seven) days.  Dispense: 42.5 g; Refill: 6   [name], NP

## 2019-12-23 DIAGNOSIS — Z471 Aftercare following joint replacement surgery: Secondary | ICD-10-CM | POA: Diagnosis not present

## 2019-12-23 DIAGNOSIS — Z96641 Presence of right artificial hip joint: Secondary | ICD-10-CM | POA: Diagnosis not present

## 2020-01-15 DIAGNOSIS — Z961 Presence of intraocular lens: Secondary | ICD-10-CM | POA: Diagnosis not present

## 2020-01-15 DIAGNOSIS — E119 Type 2 diabetes mellitus without complications: Secondary | ICD-10-CM | POA: Diagnosis not present

## 2020-01-15 DIAGNOSIS — H52203 Unspecified astigmatism, bilateral: Secondary | ICD-10-CM | POA: Diagnosis not present

## 2020-01-15 DIAGNOSIS — H43811 Vitreous degeneration, right eye: Secondary | ICD-10-CM | POA: Diagnosis not present

## 2020-01-15 LAB — HM DIABETES EYE EXAM

## 2020-01-27 ENCOUNTER — Encounter: Payer: Self-pay | Admitting: Family Medicine

## 2020-02-09 ENCOUNTER — Other Ambulatory Visit: Payer: Self-pay | Admitting: Adult Health

## 2020-02-09 DIAGNOSIS — I10 Essential (primary) hypertension: Secondary | ICD-10-CM

## 2020-02-09 DIAGNOSIS — E039 Hypothyroidism, unspecified: Secondary | ICD-10-CM

## 2020-02-09 DIAGNOSIS — Z760 Encounter for issue of repeat prescription: Secondary | ICD-10-CM

## 2020-02-12 ENCOUNTER — Other Ambulatory Visit: Payer: Self-pay | Admitting: Adult Health

## 2020-02-13 NOTE — Telephone Encounter (Signed)
Please advise, Rx is not on the med list.

## 2020-02-13 NOTE — Telephone Encounter (Signed)
Filled by cardiology

## 2020-03-02 ENCOUNTER — Other Ambulatory Visit: Payer: Self-pay

## 2020-03-02 ENCOUNTER — Encounter: Payer: Medicare Other | Admitting: *Deleted

## 2020-03-02 DIAGNOSIS — Z006 Encounter for examination for normal comparison and control in clinical research program: Secondary | ICD-10-CM

## 2020-03-02 NOTE — Research (Signed)
Subject Name: Rebecca [name]  Subject met inclusion and exclusion criteria.  The informed consent form, study requirements and expectations were reviewed with the subject and questions and concerns were addressed prior to the signing of the consent form.  The subject verbalized understanding of the trial requirements.  The subject agreed to participate in the [place] 4 trial and signed the informed consent at 1140 on 03/02/20  The informed consent was obtained prior to performance of any protocol-specific procedures for the subject.  A copy of the signed informed consent was given to the subject and a copy was placed in the subject's medical record.   [name]   The subject screen failed for this study due Total cholesterol being to low. Subject would like to be considered for future trials.

## 2020-03-19 DIAGNOSIS — Z471 Aftercare following joint replacement surgery: Secondary | ICD-10-CM | POA: Diagnosis not present

## 2020-03-19 DIAGNOSIS — Z96641 Presence of right artificial hip joint: Secondary | ICD-10-CM | POA: Diagnosis not present

## 2020-03-26 DIAGNOSIS — R2689 Other abnormalities of gait and mobility: Secondary | ICD-10-CM | POA: Diagnosis not present

## 2020-03-26 DIAGNOSIS — Z96641 Presence of right artificial hip joint: Secondary | ICD-10-CM | POA: Diagnosis not present

## 2020-03-26 DIAGNOSIS — M6281 Muscle weakness (generalized): Secondary | ICD-10-CM | POA: Diagnosis not present

## 2020-03-26 DIAGNOSIS — M25551 Pain in right hip: Secondary | ICD-10-CM | POA: Diagnosis not present

## 2020-03-29 ENCOUNTER — Ambulatory Visit (INDEPENDENT_AMBULATORY_CARE_PROVIDER_SITE_OTHER): Payer: Medicare Other

## 2020-03-29 ENCOUNTER — Other Ambulatory Visit: Payer: Self-pay

## 2020-03-29 DIAGNOSIS — Z0000 Encounter for general adult medical examination without abnormal findings: Secondary | ICD-10-CM

## 2020-03-29 NOTE — Progress Notes (Signed)
Subjective:   Rebecca [name] is a 74 y.o. female who presents for Medicare Annual (Subsequent) preventive examination.  I connected with [name] today by telephone and verified that I am speaking with the correct person using two identifiers. Location patient: home Location provider: work Persons participating in the virtual visit: patient, provider.   I discussed the limitations, risks, security and privacy concerns of performing an evaluation and management service by telephone and the availability of in person appointments. I also discussed with the patient that there may be a patient responsible charge related to this service. The patient expressed understanding and verbally consented to this telephonic visit.    Interactive audio and video telecommunications were attempted between this provider and patient, however failed, due to patient having technical difficulties OR patient did not have access to video capability.  We continued and completed visit with audio only.      Review of Systems    N/A  Cardiac Risk Factors include: advanced age (>37men, >54 women);diabetes mellitus;hypertension     Objective:    Today's Vitals   03/29/20 1116  PainSc: 6    There is no height or weight on file to calculate BMI.  Advanced Directives 03/29/2020 11/12/2019 11/10/2019 11/03/2018 09/04/2017 04/20/2017 04/04/2012  Does Patient Have a Medical Advance Directive? Yes Yes Yes Yes Yes Yes Patient has advance directive, copy not in chart  Type of Advance Directive [place];Living will [place];Living will [place];Living will - [place];Living will [place];Living will -  Does patient want to make changes to medical advance directive? No - Patient declined No - Patient declined No - Patient declined - - No - Patient declined -  Copy of [place] in Chart? Yes - validated  most recent copy scanned in chart (See row information) Yes - validated most recent copy scanned in chart (See row information) Yes - validated most recent copy scanned in chart (See row information) - - No - copy requested -    Current Medications (verified) Outpatient Encounter Medications as of 03/29/2020  Medication Sig  . Accu-Chek FastClix Lancets MISC USE   TO CHECK GLUCOSE TWICE DAILY  . ACCU-CHEK GUIDE test strip Use to test blood glucose three daily  . acetaminophen (TYLENOL) 325 MG tablet Take 650 mg by mouth every 6 (six) hours as needed for moderate pain.   Marland Kitchen ALPRAZolam (XANAX) 0.5 MG tablet Take 0.5-1 tablets (0.25-0.5 mg total) by mouth at bedtime as needed for sleep.  Marland Kitchen atorvastatin (LIPITOR) 80 MG tablet Take 1 tablet (80 mg total) by mouth daily.  Marland Kitchen conjugated estrogens (PREMARIN) vaginal cream Place 0.5 Applicatorfuls vaginally every 7 (seven) days.  Marland Kitchen denosumab (PROLIA) 60 MG/ML SOSY injection Inject 60 mg into the skin every 6 (six) months.  . diltiazem (CARDIZEM CD) 240 MG 24 hr capsule TAKE 1 CAPSULE(240 MG) BY MOUTH DAILY  . furosemide (LASIX) 20 MG tablet TAKE 1 TABLET BY MOUTH  TWICE DAILY  . JARDIANCE 10 MG TABS tablet TAKE 1 TABLET BY MOUTH  DAILY BEFORE BREAKFAST  . levothyroxine (SYNTHROID) 50 MCG tablet TAKE 1 TABLET BY MOUTH  DAILY BEFORE BREAKFAST  . potassium chloride SA (K-DUR) 20 MEQ tablet Take 1 tablet (20 mEq total) by mouth 2 (two) times daily. (Patient taking differently: Take 20 mEq by mouth daily. )  . traZODone (DESYREL) 50 MG tablet TAKE 1/2 TO 1 TABLET BY  MOUTH AT BEDTIME  AS NEEDED  FOR SLEEP  . nitroGLYCERIN (NITROSTAT) 0.4 MG SL tablet DISSOLVE 1 TABLET UNDER THE TONGUE EVERY 5 MINUTES AS  NEEDED FOR CHEST PAIN. MAX  3 DOSES IN 15 MINUTES. CALL 911 IF CHEST PAIN PERSISTS (Patient not taking: Reported on 03/29/2020)   No facility-administered encounter medications on file as of 03/29/2020.    Allergies (verified) Sulfa antibiotics and Sulfonamide  derivatives   History: Past Medical History:  Diagnosis Date  . Asthma    extrinsic  . CAD (coronary artery disease)   . Chest pain, atypical   . Complication of anesthesia   . Diabetes ([place])   . Edema   . Fatigue   . Fatigue   . GERD (gastroesophageal reflux disease)   . Hyperlipidemia   . Hypertension    familiar  . Hypothyroidism   . IBS (irritable bowel syndrome)    hx  . Keratosis, seborrheic   . Labyrinthitis   . MI (myocardial infarction) ([place])    age 12  . Osteoarthritis   . PONV (postoperative nausea and vomiting)   . S/P arthroscopy of right shoulder 03/01/2018  . Skin cancer    Melanoma    Past Surgical History:  Procedure Laterality Date  . Breast Biopsy     dense breast tissue  . BREAST EXCISIONAL BIOPSY Left 2000   benign  . CHOLECYSTECTOMY    . EUS  04/04/2012   Procedure: UPPER ENDOSCOPIC ULTRASOUND (EUS) LINEAR;  Surgeon: Milus Banister, MD;  Location: [facility];  Service: Endoscopy;  Laterality: N/A;  radial linear  . KNEE ARTHROPLASTY     total right  02-28-2010 Dr Pilar Plate Aluisio, left  . KNEE ARTHROPLASTY Bilateral 09/12/2017   Hector Shade  . LEFT HEART CATH AND CORONARY ANGIOGRAPHY N/A 04/20/2017   Procedure: LEFT HEART CATH AND CORONARY ANGIOGRAPHY;  Surgeon: Burnell Blanks, MD;  Location: [facility];  Service: Cardiovascular;  Laterality: N/A;  . TOTAL HIP ARTHROPLASTY Right 11/12/2019   Procedure: TOTAL HIP ARTHROPLASTY ANTERIOR APPROACH;  Surgeon: Gaynelle Arabian, MD;  Location: [facility];  Service: Orthopedics;  Laterality: Right;  157min  . TUBAL LIGATION     Family History  Problem Relation Age of Onset  . Heart disease Mother   . Hypertension Mother   . Heart attack Mother 77       MULTIPLES OVER TIME  . Melanoma Daughter 16       METASTATIC  . Breast cancer Sister   . Hypertension Father   . Hypertension Sister    Social History   Socioeconomic History  . Marital status: Married    Spouse name: Not on file    . Number of children: 2  . Years of education: Not on file  . Highest education level: Not on file  Occupational History  . Occupation: RN-retired    Employer: RETIRED  Tobacco Use  . Smoking status: Never Smoker  . Smokeless tobacco: Never Used  Vaping Use  . Vaping Use: Never used  Substance and Sexual Activity  . Alcohol use: No    Comment: rarely  . Drug use: No  . Sexual activity: Not on file  Other Topics Concern  . Not on file  Social History Narrative   ** Merged History Encounter **       Social Determinants of Health   Financial Resource Strain: Low Risk   . Difficulty of Paying Living Expenses: Not hard at all  Food Insecurity: No Food Insecurity  . Worried  About Running Out of Food in the Last Year: Never true  . Ran Out of Food in the Last Year: Never true  Transportation Needs: No Transportation Needs  . Lack of Transportation (Medical): No  . Lack of Transportation (Non-Medical): No  Physical Activity: Inactive  . Days of Exercise per Week: 0 days  . Minutes of Exercise per Session: 0 min  Stress: No Stress Concern Present  . Feeling of Stress : Not at all  Social Connections: Moderately Integrated  . Frequency of Communication with Friends and Family: More than three times a week  . Frequency of Social Gatherings with Friends and Family: More than three times a week  . Attends Religious Services: More than 4 times per year  . Active Member of Clubs or Organizations: No  . Attends Archivist Meetings: Never  . Marital Status: Married    Tobacco Counseling Counseling given: Not Answered   Clinical Intake:  Pre-visit preparation completed: Yes  Pain : 0-10 Pain Score: 6  Pain Type: Chronic pain Pain Location: Groin Pain Orientation: Right Pain Descriptors / Indicators: Sharp, Aching Pain Onset: More than a month ago Pain Frequency: Constant Pain Relieving Factors: Standing, and ice  Pain Relieving Factors: Standing, and  ice  Nutritional Risks: None Diabetes: Yes (Patient states checks glucose 1-2 times per day) CBG done?: No Did pt. bring in CBG monitor from home?: No  How often do you need to have someone help you when you read instructions, pamphlets, or other written materials from your doctor or pharmacy?: 1 - Never What is the last grade level you completed in school?: College  Diabetic?Yes  Interpreter Needed?: No  Information entered by :: [place] of Daily Living In your present state of health, do you have any difficulty performing the following activities: 03/29/2020 11/12/2019  Hearing? Y N  Vision? N N  Difficulty concentrating or making decisions? N N  Walking or climbing stairs? Y N  Comment When having groin pain -  Dressing or bathing? N N  Doing errands, shopping? N Y  Conservation officer, nature and eating ? N -  Using the Toilet? N -  In the past six months, have you accidently leaked urine? N -  Do you have problems with loss of bowel control? N -  Managing your Medications? N -  Managing your Finances? N -  Housekeeping or managing your Housekeeping? N -  Some recent data might be hidden    Patient Care Team: [name], NP as PCP - General (Family Medicine) Dorothy Spark, MD as PCP - Cardiology (Cardiology) Florinda Marker as Physician Assistant (Physician Assistant) Sydnee Cabal, MD as Consulting Physician (Orthopedic Surgery) Gaynelle Arabian, MD as Consulting Physician (Orthopedic Surgery)  Indicate any recent [place] you may have received from other than [facility] providers in the past year (date may be approximate).     Assessment:   This is a routine wellness examination for Darla.  Hearing/Vision screen  Hearing Screening   125Hz  250Hz  500Hz  1000Hz  2000Hz  3000Hz  4000Hz  6000Hz  8000Hz   Right ear:           Left ear:           Vision Screening Comments: Patient states gets eye  checked annually    Dietary issues and exercise  activities discussed: Current Exercise Habits: The patient does not participate in regular exercise at present, Exercise limited by: orthopedic condition(s)  Goals    . Patient Stated  I want to get back active     . Weight (lb) < 130 lb (59 kg)      Depression Screen PHQ 2/9 Scores 03/29/2020 12/19/2019 11/09/2017 11/07/2016 11/05/2015 06/22/2015 06/22/2015  PHQ - 2 Score 0 0 0 0 0 0 0  PHQ- 9 Score 0 - - - - - -    Fall Risk Fall Risk  03/29/2020 12/19/2019 02/12/2019 11/09/2017 11/07/2016  Falls in the past year? 0 0 0 No No  Comment - - Emmi Telephone Survey: data to providers prior to load - -  Number falls in past yr: 0 - - - -  Injury with Fall? 0 - - - -  Risk for fall due to : Medication side effect - - - -    Any stairs in or around the home? No  If so, are there any without handrails? No  Home free of loose throw rugs in walkways, pet beds, electrical cords, etc? Yes  Adequate lighting in your home to reduce risk of falls? Yes   ASSISTIVE DEVICES UTILIZED TO PREVENT FALLS:  Life alert? Yes  Use of a cane, walker or w/c? No  Grab bars in the bathroom? Yes  Shower chair or bench in shower? Yes  Elevated toilet seat or a handicapped toilet? Yes    Cognitive Function:     6CIT Screen 03/29/2020  What Year? 0 points  What month? 0 points  What time? 0 points  Count back from 20 0 points  Months in reverse 0 points  Repeat phrase 0 points  Total Score 0    Immunizations Immunization History  Administered Date(s) Administered  . Fluad Quad(high Dose 65+) 04/17/2019  . Hep A / Hep B 11/22/2009, 12/20/2009  . Influenza Split 03/30/2011, 04/12/2012  . Influenza Whole 04/26/2007, 04/13/2009  . Influenza, High Dose Seasonal PF 04/23/2015, 03/14/2017, 04/05/2018  . Influenza, Seasonal, Injecte, Preservative Fre 04/29/2012  . Influenza-Unspecified 04/18/2014, 04/16/2018  . PFIZER SARS-COV-2 Vaccination 08/07/2019, 08/28/2019  . Pneumococcal Conjugate-13 06/30/2014  .  Pneumococcal Polysaccharide-23 07/18/2003, 02/23/2009, 11/07/2016  . Td 07/17/2001  . Tdap 05/16/2011  . Zoster 04/05/2010    TDAP status: Up to date Flu Vaccine status: Up to date Pneumococcal vaccine status: Completed during today's visit. Covid-19 vaccine status: Completed vaccines  Qualifies for Shingles Vaccine? Yes   Zostavax completed Yes   Shingrix Completed?: No.    Education has been provided regarding the importance of this vaccine. Patient has been advised to call insurance company to determine out of pocket expense if they have not yet received this vaccine. Advised may also receive vaccine at local pharmacy or Health Dept. Verbalized acceptance and understanding.  Screening Tests Health Maintenance  Topic Date Due  . FOOT EXAM  02/06/2018  . URINE MICROALBUMIN  11/10/2018  . COLONOSCOPY  09/01/2019  . INFLUENZA VACCINE  02/15/2020  . HEMOGLOBIN A1C  05/06/2020  . OPHTHALMOLOGY EXAM  01/14/2021  . TETANUS/TDAP  05/15/2021  . DEXA SCAN  Completed  . COVID-19 Vaccine  Completed  . Hepatitis C Screening  Completed  . PNA vac Low Risk Adult  Completed    Health Maintenance  Health Maintenance Due  Topic Date Due  . FOOT EXAM  02/06/2018  . URINE MICROALBUMIN  11/10/2018  . COLONOSCOPY  09/01/2019  . INFLUENZA VACCINE  02/15/2020    Colorectal cancer screening: Completed 08/31/2009. Repeat every 10 years Mammogram status: Completed 09/29/2019. Repeat every year Bone Density status: Completed 12/20/2017. Results reflect: Bone density results: OSTEOPENIA.  Repeat every 5 years.  Lung Cancer Screening: (Low Dose CT Chest recommended if Age 33-80 years, 30 pack-year currently smoking OR have quit w/in 15years.) does not qualify.   Lung Cancer Screening Referral: N/A  Additional Screening:  Hepatitis C Screening: does qualify; Completed 11/07/2016  Vision Screening: Recommended annual ophthalmology exams for early detection of glaucoma and other disorders of the  eye. Is the patient up to date with their annual eye exam?  Yes  Who is the provider or what is the name of the office in which the patient attends annual eye exams? Dr. Kathrin Penner If pt is not established with a provider, would they like to be referred to a provider to establish care? No .   Dental Screening: Recommended annual dental exams for proper oral hygiene  Community Resource Referral / Chronic Care Management: CRR required this visit?  No   CCM required this visit?  No      Plan:     I have personally reviewed and noted the following in the patient's chart:   . Medical and social history . Use of alcohol, tobacco or illicit drugs  . Current medications and supplements . Functional ability and status . Nutritional status . Physical activity . Advanced directives . List of other physicians . Hospitalizations, surgeries, and ER visits in previous 12 months . Vitals . Screenings to include cognitive, depression, and falls . Referrals and appointments  In addition, I have reviewed and discussed with patient certain preventive protocols, quality metrics, and best practice recommendations. A written personalized care plan for preventive services as well as general preventive health recommendations were provided to patient.     Ofilia Neas, LPN   [id number]   Nurse Notes: None

## 2020-03-29 NOTE — Patient Instructions (Signed)
Rebecca [name] , Thank you for taking time to come for your Medicare Wellness Visit. I appreciate your ongoing commitment to your health goals. Please review the following plan we discussed and let me know if I can assist you in the future.   Screening recommendations/referrals: Colonoscopy: patient refused  Mammogram: Up to date, next due 09/28/2020 Bone Density: Up to date, next due 12/21/2022 Recommended yearly ophthalmology/optometry visit for glaucoma screening and checkup Recommended yearly dental visit for hygiene and checkup  Vaccinations: Influenza vaccine: Currently due, you may receive in office or at your local pharmacy Pneumococcal vaccine: completed series Tdap vaccine: Up to date, next due 05/15/2021 Shingles vaccine: Currently due for shingrix, please contact your pharmacy to discuss cost and to receive vaccine    Advanced directives: Copies on file   Conditions/risks identified: None   Next appointment: None   Preventive Care 65 Years and Older, Female Preventive care refers to lifestyle choices and visits with your health care provider that can promote health and wellness. What does preventive care include?  A yearly physical exam. This is also called an annual well check.  Dental exams once or twice a year.  Routine eye exams. Ask your health care provider how often you should have your eyes checked.  Personal lifestyle choices, including:  Daily care of your teeth and gums.  Regular physical activity.  Eating a healthy diet.  Avoiding tobacco and drug use.  Limiting alcohol use.  Practicing safe sex.  Taking low-dose aspirin every day.  Taking vitamin and mineral supplements as recommended by your health care provider. What happens during an annual well check? The services and screenings done by your health care provider during your annual well check will depend on your age, overall health, lifestyle risk factors, and family history of  disease. Counseling  Your health care provider may ask you questions about your:  Alcohol use.  Tobacco use.  Drug use.  Emotional well-being.  Home and relationship well-being.  Sexual activity.  Eating habits.  History of falls.  Memory and ability to understand (cognition).  Work and work Statistician.  Reproductive health. Screening  You may have the following tests or measurements:  Height, weight, and BMI.  Blood pressure.  Lipid and cholesterol levels. These may be checked every 5 years, or more frequently if you are over 35 years old.  Skin check.  Lung cancer screening. You may have this screening every year starting at age 12 if you have a 30-pack-year history of smoking and currently smoke or have quit within the past 15 years.  Fecal occult blood test (FOBT) of the stool. You may have this test every year starting at age 18.  Flexible sigmoidoscopy or colonoscopy. You may have a sigmoidoscopy every 5 years or a colonoscopy every 10 years starting at age 23.  Hepatitis C blood test.  Hepatitis B blood test.  Sexually transmitted disease (STD) testing.  Diabetes screening. This is done by checking your blood sugar (glucose) after you have not eaten for a while (fasting). You may have this done every 1-3 years.  Bone density scan. This is done to screen for osteoporosis. You may have this done starting at age 5.  Mammogram. This may be done every 1-2 years. Talk to your health care provider about how often you should have regular mammograms. Talk with your health care provider about your test results, treatment options, and if necessary, the need for more tests. Vaccines  Your health care provider may recommend  certain vaccines, such as:  Influenza vaccine. This is recommended every year.  Tetanus, diphtheria, and acellular pertussis (Tdap, Td) vaccine. You may need a Td booster every 10 years.  Zoster vaccine. You may need this after age  52.  Pneumococcal 13-valent conjugate (PCV13) vaccine. One dose is recommended after age 28.  Pneumococcal polysaccharide (PPSV23) vaccine. One dose is recommended after age 13. Talk to your health care provider about which screenings and vaccines you need and how often you need them. This information is not intended to replace advice given to you by your health care provider. Make sure you discuss any questions you have with your health care provider. Document Released: 07/30/2015 Document Revised: 03/22/2016 Document Reviewed: 05/04/2015 Elsevier Interactive Patient Education  2017 [place] Prevention in the Home Falls can cause injuries. They can happen to people of all ages. There are many things you can do to make your home safe and to help prevent falls. What can I do on the outside of my home?  Regularly fix the edges of walkways and driveways and fix any cracks.  Remove anything that might make you trip as you walk through a door, such as a raised step or threshold.  Trim any bushes or trees on the path to your home.  Use bright outdoor lighting.  Clear any walking paths of anything that might make someone trip, such as rocks or tools.  Regularly check to see if handrails are loose or broken. Make sure that both sides of any steps have handrails.  Any raised decks and porches should have guardrails on the edges.  Have any leaves, snow, or ice cleared regularly.  Use sand or salt on walking paths during winter.  Clean up any spills in your garage right away. This includes oil or grease spills. What can I do in the bathroom?  Use night lights.  Install grab bars by the toilet and in the tub and shower. Do not use towel bars as grab bars.  Use non-skid mats or decals in the tub or shower.  If you need to sit down in the shower, use a plastic, non-slip stool.  Keep the floor dry. Clean up any water that spills on the floor as soon as it happens.  Remove  soap buildup in the tub or shower regularly.  Attach bath mats securely with double-sided non-slip rug tape.  Do not have throw rugs and other things on the floor that can make you trip. What can I do in the bedroom?  Use night lights.  Make sure that you have a light by your bed that is easy to reach.  Do not use any sheets or blankets that are too big for your bed. They should not hang down onto the floor.  Have a firm chair that has side arms. You can use this for support while you get dressed.  Do not have throw rugs and other things on the floor that can make you trip. What can I do in the kitchen?  Clean up any spills right away.  Avoid walking on wet floors.  Keep items that you use a lot in easy-to-reach places.  If you need to reach something above you, use a strong step stool that has a grab bar.  Keep electrical cords out of the way.  Do not use floor polish or wax that makes floors slippery. If you must use wax, use non-skid floor wax.  Do not have throw rugs and other  things on the floor that can make you trip. What can I do with my stairs?  Do not leave any items on the stairs.  Make sure that there are handrails on both sides of the stairs and use them. Fix handrails that are broken or loose. Make sure that handrails are as long as the stairways.  Check any carpeting to make sure that it is firmly attached to the stairs. Fix any carpet that is loose or worn.  Avoid having throw rugs at the top or bottom of the stairs. If you do have throw rugs, attach them to the floor with carpet tape.  Make sure that you have a light switch at the top of the stairs and the bottom of the stairs. If you do not have them, ask someone to add them for you. What else can I do to help prevent falls?  Wear shoes that:  Do not have high heels.  Have rubber bottoms.  Are comfortable and fit you well.  Are closed at the toe. Do not wear sandals.  If you use a  stepladder:  Make sure that it is fully opened. Do not climb a closed stepladder.  Make sure that both sides of the stepladder are locked into place.  Ask someone to hold it for you, if possible.  Clearly mark and make sure that you can see:  Any grab bars or handrails.  First and last steps.  Where the edge of each step is.  Use tools that help you move around (mobility aids) if they are needed. These include:  Canes.  Walkers.  Scooters.  Crutches.  Turn on the lights when you go into a dark area. Replace any light bulbs as soon as they burn out.  Set up your furniture so you have a clear path. Avoid moving your furniture around.  If any of your floors are uneven, fix them.  If there are any pets around you, be aware of where they are.  Review your medicines with your doctor. Some medicines can make you feel dizzy. This can increase your chance of falling. Ask your doctor what other things that you can do to help prevent falls. This information is not intended to replace advice given to you by your health care provider. Make sure you discuss any questions you have with your health care provider. Document Released: 04/29/2009 Document Revised: 12/09/2015 Document Reviewed: 08/07/2014 Elsevier Interactive Patient Education  2017 [employer].

## 2020-03-30 DIAGNOSIS — M6281 Muscle weakness (generalized): Secondary | ICD-10-CM | POA: Diagnosis not present

## 2020-03-30 DIAGNOSIS — Z96641 Presence of right artificial hip joint: Secondary | ICD-10-CM | POA: Diagnosis not present

## 2020-03-30 DIAGNOSIS — R2689 Other abnormalities of gait and mobility: Secondary | ICD-10-CM | POA: Diagnosis not present

## 2020-03-30 DIAGNOSIS — M25551 Pain in right hip: Secondary | ICD-10-CM | POA: Diagnosis not present

## 2020-03-31 DIAGNOSIS — Z23 Encounter for immunization: Secondary | ICD-10-CM | POA: Diagnosis not present

## 2020-04-01 DIAGNOSIS — M25551 Pain in right hip: Secondary | ICD-10-CM | POA: Diagnosis not present

## 2020-04-01 DIAGNOSIS — Z96641 Presence of right artificial hip joint: Secondary | ICD-10-CM | POA: Diagnosis not present

## 2020-04-01 DIAGNOSIS — R2689 Other abnormalities of gait and mobility: Secondary | ICD-10-CM | POA: Diagnosis not present

## 2020-04-01 DIAGNOSIS — M6281 Muscle weakness (generalized): Secondary | ICD-10-CM | POA: Diagnosis not present

## 2020-04-02 ENCOUNTER — Telehealth: Payer: Self-pay | Admitting: Adult Health

## 2020-04-02 NOTE — Telephone Encounter (Signed)
Insurance verification started.

## 2020-04-02 NOTE — Telephone Encounter (Signed)
Pt is calling in to see if she can schedule her Prolia injection state that it is overdue.

## 2020-04-06 DIAGNOSIS — R2689 Other abnormalities of gait and mobility: Secondary | ICD-10-CM | POA: Diagnosis not present

## 2020-04-06 DIAGNOSIS — M25551 Pain in right hip: Secondary | ICD-10-CM | POA: Diagnosis not present

## 2020-04-06 DIAGNOSIS — M6281 Muscle weakness (generalized): Secondary | ICD-10-CM | POA: Diagnosis not present

## 2020-04-06 DIAGNOSIS — Z96641 Presence of right artificial hip joint: Secondary | ICD-10-CM | POA: Diagnosis not present

## 2020-04-08 DIAGNOSIS — M6281 Muscle weakness (generalized): Secondary | ICD-10-CM | POA: Diagnosis not present

## 2020-04-08 DIAGNOSIS — Z96641 Presence of right artificial hip joint: Secondary | ICD-10-CM | POA: Diagnosis not present

## 2020-04-08 DIAGNOSIS — M25551 Pain in right hip: Secondary | ICD-10-CM | POA: Diagnosis not present

## 2020-04-08 DIAGNOSIS — R2689 Other abnormalities of gait and mobility: Secondary | ICD-10-CM | POA: Diagnosis not present

## 2020-04-12 ENCOUNTER — Other Ambulatory Visit: Payer: Self-pay | Admitting: Adult Health

## 2020-04-13 DIAGNOSIS — R2689 Other abnormalities of gait and mobility: Secondary | ICD-10-CM | POA: Diagnosis not present

## 2020-04-13 DIAGNOSIS — Z96641 Presence of right artificial hip joint: Secondary | ICD-10-CM | POA: Diagnosis not present

## 2020-04-13 DIAGNOSIS — M25551 Pain in right hip: Secondary | ICD-10-CM | POA: Diagnosis not present

## 2020-04-13 DIAGNOSIS — M6281 Muscle weakness (generalized): Secondary | ICD-10-CM | POA: Diagnosis not present

## 2020-04-15 DIAGNOSIS — R2689 Other abnormalities of gait and mobility: Secondary | ICD-10-CM | POA: Diagnosis not present

## 2020-04-15 DIAGNOSIS — Z96641 Presence of right artificial hip joint: Secondary | ICD-10-CM | POA: Diagnosis not present

## 2020-04-15 DIAGNOSIS — M25551 Pain in right hip: Secondary | ICD-10-CM | POA: Diagnosis not present

## 2020-04-15 DIAGNOSIS — M6281 Muscle weakness (generalized): Secondary | ICD-10-CM | POA: Diagnosis not present

## 2020-04-20 DIAGNOSIS — M6281 Muscle weakness (generalized): Secondary | ICD-10-CM | POA: Diagnosis not present

## 2020-04-20 DIAGNOSIS — R2689 Other abnormalities of gait and mobility: Secondary | ICD-10-CM | POA: Diagnosis not present

## 2020-04-20 DIAGNOSIS — M25551 Pain in right hip: Secondary | ICD-10-CM | POA: Diagnosis not present

## 2020-04-20 DIAGNOSIS — Z96641 Presence of right artificial hip joint: Secondary | ICD-10-CM | POA: Diagnosis not present

## 2020-04-20 NOTE — Telephone Encounter (Signed)
Prolia okay to schedule.

## 2020-04-21 ENCOUNTER — Other Ambulatory Visit: Payer: Self-pay

## 2020-04-21 ENCOUNTER — Ambulatory Visit (INDEPENDENT_AMBULATORY_CARE_PROVIDER_SITE_OTHER): Payer: Medicare Other | Admitting: *Deleted

## 2020-04-21 DIAGNOSIS — M810 Age-related osteoporosis without current pathological fracture: Secondary | ICD-10-CM

## 2020-04-21 MED ORDER — DENOSUMAB 60 MG/ML ~~LOC~~ SOSY
60.0000 mg | PREFILLED_SYRINGE | Freq: Once | SUBCUTANEOUS | Status: AC
  Administered 2020-04-21: 60 mg via SUBCUTANEOUS

## 2020-04-21 NOTE — Progress Notes (Signed)
Per orders of Cory Nafziger, NP, injection of Prolia given by Patrece Tallie. Patient tolerated injection well.  

## 2020-04-22 DIAGNOSIS — R2689 Other abnormalities of gait and mobility: Secondary | ICD-10-CM | POA: Diagnosis not present

## 2020-04-22 DIAGNOSIS — M6281 Muscle weakness (generalized): Secondary | ICD-10-CM | POA: Diagnosis not present

## 2020-04-22 DIAGNOSIS — Z96641 Presence of right artificial hip joint: Secondary | ICD-10-CM | POA: Diagnosis not present

## 2020-04-22 DIAGNOSIS — M25551 Pain in right hip: Secondary | ICD-10-CM | POA: Diagnosis not present

## 2020-04-27 DIAGNOSIS — R2689 Other abnormalities of gait and mobility: Secondary | ICD-10-CM | POA: Diagnosis not present

## 2020-04-27 DIAGNOSIS — M25551 Pain in right hip: Secondary | ICD-10-CM | POA: Diagnosis not present

## 2020-04-27 DIAGNOSIS — M6281 Muscle weakness (generalized): Secondary | ICD-10-CM | POA: Diagnosis not present

## 2020-04-27 DIAGNOSIS — Z96641 Presence of right artificial hip joint: Secondary | ICD-10-CM | POA: Diagnosis not present

## 2020-04-28 ENCOUNTER — Other Ambulatory Visit: Payer: Self-pay | Admitting: Adult Health

## 2020-04-28 DIAGNOSIS — G479 Sleep disorder, unspecified: Secondary | ICD-10-CM

## 2020-04-29 DIAGNOSIS — M1611 Unilateral primary osteoarthritis, right hip: Secondary | ICD-10-CM | POA: Diagnosis not present

## 2020-04-30 DIAGNOSIS — Z96641 Presence of right artificial hip joint: Secondary | ICD-10-CM | POA: Diagnosis not present

## 2020-04-30 DIAGNOSIS — R2689 Other abnormalities of gait and mobility: Secondary | ICD-10-CM | POA: Diagnosis not present

## 2020-04-30 DIAGNOSIS — M6281 Muscle weakness (generalized): Secondary | ICD-10-CM | POA: Diagnosis not present

## 2020-04-30 DIAGNOSIS — M25551 Pain in right hip: Secondary | ICD-10-CM | POA: Diagnosis not present

## 2020-05-04 DIAGNOSIS — Z96641 Presence of right artificial hip joint: Secondary | ICD-10-CM | POA: Diagnosis not present

## 2020-05-04 DIAGNOSIS — R2689 Other abnormalities of gait and mobility: Secondary | ICD-10-CM | POA: Diagnosis not present

## 2020-05-04 DIAGNOSIS — M25551 Pain in right hip: Secondary | ICD-10-CM | POA: Diagnosis not present

## 2020-05-04 DIAGNOSIS — M6281 Muscle weakness (generalized): Secondary | ICD-10-CM | POA: Diagnosis not present

## 2020-05-06 DIAGNOSIS — Z96641 Presence of right artificial hip joint: Secondary | ICD-10-CM | POA: Diagnosis not present

## 2020-05-06 DIAGNOSIS — M25551 Pain in right hip: Secondary | ICD-10-CM | POA: Diagnosis not present

## 2020-05-06 DIAGNOSIS — M6281 Muscle weakness (generalized): Secondary | ICD-10-CM | POA: Diagnosis not present

## 2020-05-06 DIAGNOSIS — R2689 Other abnormalities of gait and mobility: Secondary | ICD-10-CM | POA: Diagnosis not present

## 2020-05-11 DIAGNOSIS — M25551 Pain in right hip: Secondary | ICD-10-CM | POA: Diagnosis not present

## 2020-05-11 DIAGNOSIS — Z23 Encounter for immunization: Secondary | ICD-10-CM | POA: Diagnosis not present

## 2020-05-11 DIAGNOSIS — Z96641 Presence of right artificial hip joint: Secondary | ICD-10-CM | POA: Diagnosis not present

## 2020-05-11 DIAGNOSIS — R2689 Other abnormalities of gait and mobility: Secondary | ICD-10-CM | POA: Diagnosis not present

## 2020-05-11 DIAGNOSIS — M6281 Muscle weakness (generalized): Secondary | ICD-10-CM | POA: Diagnosis not present

## 2020-05-13 DIAGNOSIS — M6281 Muscle weakness (generalized): Secondary | ICD-10-CM | POA: Diagnosis not present

## 2020-05-13 DIAGNOSIS — R2689 Other abnormalities of gait and mobility: Secondary | ICD-10-CM | POA: Diagnosis not present

## 2020-05-13 DIAGNOSIS — M25551 Pain in right hip: Secondary | ICD-10-CM | POA: Diagnosis not present

## 2020-05-13 DIAGNOSIS — Z96641 Presence of right artificial hip joint: Secondary | ICD-10-CM | POA: Diagnosis not present

## 2020-05-18 DIAGNOSIS — R2689 Other abnormalities of gait and mobility: Secondary | ICD-10-CM | POA: Diagnosis not present

## 2020-05-18 DIAGNOSIS — M6281 Muscle weakness (generalized): Secondary | ICD-10-CM | POA: Diagnosis not present

## 2020-05-18 DIAGNOSIS — Z96641 Presence of right artificial hip joint: Secondary | ICD-10-CM | POA: Diagnosis not present

## 2020-05-18 DIAGNOSIS — M25551 Pain in right hip: Secondary | ICD-10-CM | POA: Diagnosis not present

## 2020-05-20 DIAGNOSIS — M6281 Muscle weakness (generalized): Secondary | ICD-10-CM | POA: Diagnosis not present

## 2020-05-20 DIAGNOSIS — M25551 Pain in right hip: Secondary | ICD-10-CM | POA: Diagnosis not present

## 2020-05-20 DIAGNOSIS — Z96641 Presence of right artificial hip joint: Secondary | ICD-10-CM | POA: Diagnosis not present

## 2020-05-20 DIAGNOSIS — R2689 Other abnormalities of gait and mobility: Secondary | ICD-10-CM | POA: Diagnosis not present

## 2020-05-25 DIAGNOSIS — M6281 Muscle weakness (generalized): Secondary | ICD-10-CM | POA: Diagnosis not present

## 2020-05-25 DIAGNOSIS — R2689 Other abnormalities of gait and mobility: Secondary | ICD-10-CM | POA: Diagnosis not present

## 2020-05-25 DIAGNOSIS — M25551 Pain in right hip: Secondary | ICD-10-CM | POA: Diagnosis not present

## 2020-05-25 DIAGNOSIS — Z96641 Presence of right artificial hip joint: Secondary | ICD-10-CM | POA: Diagnosis not present

## 2020-06-01 DIAGNOSIS — L309 Dermatitis, unspecified: Secondary | ICD-10-CM | POA: Diagnosis not present

## 2020-06-01 DIAGNOSIS — D225 Melanocytic nevi of trunk: Secondary | ICD-10-CM | POA: Diagnosis not present

## 2020-06-01 DIAGNOSIS — Z86018 Personal history of other benign neoplasm: Secondary | ICD-10-CM | POA: Diagnosis not present

## 2020-06-01 DIAGNOSIS — D485 Neoplasm of uncertain behavior of skin: Secondary | ICD-10-CM | POA: Diagnosis not present

## 2020-06-01 DIAGNOSIS — L821 Other seborrheic keratosis: Secondary | ICD-10-CM | POA: Diagnosis not present

## 2020-06-01 DIAGNOSIS — Z85820 Personal history of malignant melanoma of skin: Secondary | ICD-10-CM | POA: Diagnosis not present

## 2020-06-01 DIAGNOSIS — L810 Postinflammatory hyperpigmentation: Secondary | ICD-10-CM | POA: Diagnosis not present

## 2020-06-01 DIAGNOSIS — Z85828 Personal history of other malignant neoplasm of skin: Secondary | ICD-10-CM | POA: Diagnosis not present

## 2020-06-01 DIAGNOSIS — L578 Other skin changes due to chronic exposure to nonionizing radiation: Secondary | ICD-10-CM | POA: Diagnosis not present

## 2020-06-01 DIAGNOSIS — L814 Other melanin hyperpigmentation: Secondary | ICD-10-CM | POA: Diagnosis not present

## 2020-06-08 DIAGNOSIS — R2689 Other abnormalities of gait and mobility: Secondary | ICD-10-CM | POA: Diagnosis not present

## 2020-06-08 DIAGNOSIS — M6281 Muscle weakness (generalized): Secondary | ICD-10-CM | POA: Diagnosis not present

## 2020-06-08 DIAGNOSIS — Z96641 Presence of right artificial hip joint: Secondary | ICD-10-CM | POA: Diagnosis not present

## 2020-06-08 DIAGNOSIS — M25551 Pain in right hip: Secondary | ICD-10-CM | POA: Diagnosis not present

## 2020-06-15 DIAGNOSIS — R2689 Other abnormalities of gait and mobility: Secondary | ICD-10-CM | POA: Diagnosis not present

## 2020-06-15 DIAGNOSIS — Z96641 Presence of right artificial hip joint: Secondary | ICD-10-CM | POA: Diagnosis not present

## 2020-06-15 DIAGNOSIS — M6281 Muscle weakness (generalized): Secondary | ICD-10-CM | POA: Diagnosis not present

## 2020-06-15 DIAGNOSIS — M25551 Pain in right hip: Secondary | ICD-10-CM | POA: Diagnosis not present

## 2020-06-17 DIAGNOSIS — M6281 Muscle weakness (generalized): Secondary | ICD-10-CM | POA: Diagnosis not present

## 2020-06-17 DIAGNOSIS — Z96641 Presence of right artificial hip joint: Secondary | ICD-10-CM | POA: Diagnosis not present

## 2020-06-17 DIAGNOSIS — R2689 Other abnormalities of gait and mobility: Secondary | ICD-10-CM | POA: Diagnosis not present

## 2020-06-17 DIAGNOSIS — M25551 Pain in right hip: Secondary | ICD-10-CM | POA: Diagnosis not present

## 2020-06-22 DIAGNOSIS — R2689 Other abnormalities of gait and mobility: Secondary | ICD-10-CM | POA: Diagnosis not present

## 2020-06-22 DIAGNOSIS — Z96641 Presence of right artificial hip joint: Secondary | ICD-10-CM | POA: Diagnosis not present

## 2020-06-22 DIAGNOSIS — M25551 Pain in right hip: Secondary | ICD-10-CM | POA: Diagnosis not present

## 2020-06-22 DIAGNOSIS — M6281 Muscle weakness (generalized): Secondary | ICD-10-CM | POA: Diagnosis not present

## 2020-06-23 DIAGNOSIS — M25551 Pain in right hip: Secondary | ICD-10-CM | POA: Diagnosis not present

## 2020-06-23 DIAGNOSIS — Z96641 Presence of right artificial hip joint: Secondary | ICD-10-CM | POA: Diagnosis not present

## 2020-06-29 ENCOUNTER — Other Ambulatory Visit: Payer: Self-pay | Admitting: Adult Health

## 2020-06-29 DIAGNOSIS — G4700 Insomnia, unspecified: Secondary | ICD-10-CM

## 2020-07-21 DIAGNOSIS — M25551 Pain in right hip: Secondary | ICD-10-CM | POA: Diagnosis not present

## 2020-07-21 DIAGNOSIS — M1611 Unilateral primary osteoarthritis, right hip: Secondary | ICD-10-CM | POA: Diagnosis not present

## 2020-08-12 DIAGNOSIS — M1611 Unilateral primary osteoarthritis, right hip: Secondary | ICD-10-CM | POA: Diagnosis not present

## 2020-08-20 ENCOUNTER — Other Ambulatory Visit: Payer: Self-pay | Admitting: Family Medicine

## 2020-08-31 ENCOUNTER — Other Ambulatory Visit: Payer: Self-pay | Admitting: Adult Health

## 2020-08-31 DIAGNOSIS — I2510 Atherosclerotic heart disease of native coronary artery without angina pectoris: Secondary | ICD-10-CM

## 2020-08-31 DIAGNOSIS — E782 Mixed hyperlipidemia: Secondary | ICD-10-CM

## 2020-09-01 NOTE — Telephone Encounter (Signed)
I have scheduled the pt for a follow up.  Rx sent to the pharmacy by e-scribe.

## 2020-09-01 NOTE — Telephone Encounter (Signed)
CPE is not until June, we did labs in April for pre surgical screen.   Ok to send in the statin until then. Jardiance, ok to fill for three months but we do need to see her

## 2020-09-01 NOTE — Telephone Encounter (Signed)
Looks like she is due for labs in April.  Do you want to see her for cpx at that time?  What would you like to do about the Jardiance?

## 2020-09-03 ENCOUNTER — Ambulatory Visit (INDEPENDENT_AMBULATORY_CARE_PROVIDER_SITE_OTHER): Payer: Medicare Other | Admitting: Adult Health

## 2020-09-03 ENCOUNTER — Other Ambulatory Visit: Payer: Self-pay

## 2020-09-03 ENCOUNTER — Encounter: Payer: Self-pay | Admitting: Adult Health

## 2020-09-03 VITALS — BP 124/70 | Temp 98.4°F | Wt 140.0 lb

## 2020-09-03 DIAGNOSIS — E119 Type 2 diabetes mellitus without complications: Secondary | ICD-10-CM | POA: Diagnosis not present

## 2020-09-03 DIAGNOSIS — E039 Hypothyroidism, unspecified: Secondary | ICD-10-CM | POA: Diagnosis not present

## 2020-09-03 DIAGNOSIS — I2510 Atherosclerotic heart disease of native coronary artery without angina pectoris: Secondary | ICD-10-CM

## 2020-09-03 LAB — TSH: TSH: 1.58 u[IU]/mL (ref 0.35–4.50)

## 2020-09-03 LAB — POCT GLYCOSYLATED HEMOGLOBIN (HGB A1C): HbA1c, POC (controlled diabetic range): 7.6 % — AB (ref 0.0–7.0)

## 2020-09-03 MED ORDER — FREESTYLE LIBRE 2 SENSOR MISC: 0 refills | Status: DC

## 2020-09-03 NOTE — Progress Notes (Signed)
Subjective:    Patient ID: Rebecca [name], female    DOB: [birth year], 75 y.o.   MRN: [id number]  HPI 75 year old female who  has a past medical history of Asthma, CAD (coronary artery disease), Chest pain, atypical, Complication of anesthesia, Diabetes ([place]), Edema, Fatigue, Fatigue, GERD (gastroesophageal reflux disease), Hyperlipidemia, Hypertension, Hypothyroidism, IBS (irritable bowel syndrome), Keratosis, seborrheic, Labyrinthitis, MI (myocardial infarction) ([place]), Osteoarthritis, PONV (postoperative nausea and vomiting), S/P arthroscopy of right shoulder (03/01/2018), and Skin cancer.  She presents to the office today for follow up regarding DM.  Currently prescribed Jardiance 10 mg daily.  She does monitor her blood sugars at home and reports that her blood sugars have been more elevated over the last 5-6 months. She reports readings staying pretty stable in the 150-170's, she has had a few blood sugars in the 200's   She has been doing a lot of aerobic exercise and eating healthy, usually has about 20-30 grams of carbs a day.  Lab Results  Component Value Date   HGBA1C 7.6 (A) 09/03/2020   Unfortunately, she continues to be in significant pain at times status post right hip surgery.  She is working with Dr. Wynelle Link.  Would also like to have her TSH rechecked.  Currently prescribed Synthroid 50 mcg.  Reports that she has noticed any dry skin and dry hair.    Review of Systems See HPI   Past Medical History:  Diagnosis Date  . Asthma    extrinsic  . CAD (coronary artery disease)   . Chest pain, atypical   . Complication of anesthesia   . Diabetes ([place])   . Edema   . Fatigue   . Fatigue   . GERD (gastroesophageal reflux disease)   . Hyperlipidemia   . Hypertension    familiar  . Hypothyroidism   . IBS (irritable bowel syndrome)    hx  . Keratosis, seborrheic   . Labyrinthitis   . MI (myocardial infarction) ([place])    age 73  . Osteoarthritis   . PONV (postoperative  nausea and vomiting)   . S/P arthroscopy of right shoulder 03/01/2018  . Skin cancer    Melanoma     Social History   Socioeconomic History  . Marital status: Married    Spouse name: Not on file  . Number of children: 2  . Years of education: Not on file  . Highest education level: Not on file  Occupational History  . Occupation: RN-retired    Employer: RETIRED  Tobacco Use  . Smoking status: Never Smoker  . Smokeless tobacco: Never Used  Vaping Use  . Vaping Use: Never used  Substance and Sexual Activity  . Alcohol use: No    Comment: rarely  . Drug use: No  . Sexual activity: Not on file  Other Topics Concern  . Not on file  Social History Narrative   ** Merged History Encounter **       Social Determinants of Health   Financial Resource Strain: Low Risk   . Difficulty of Paying Living Expenses: Not hard at all  Food Insecurity: No Food Insecurity  . Worried About Charity fundraiser in the Last Year: Never true  . Ran Out of Food in the Last Year: Never true  Transportation Needs: No Transportation Needs  . Lack of Transportation (Medical): No  . Lack of Transportation (Non-Medical): No  Physical Activity: Inactive  . Days of Exercise per Week: 0 days  . Minutes of  Exercise per Session: 0 min  Stress: No Stress Concern Present  . Feeling of Stress : Not at all  Social Connections: Moderately Integrated  . Frequency of Communication with Friends and Family: More than three times a week  . Frequency of Social Gatherings with Friends and Family: More than three times a week  . Attends Religious Services: More than 4 times per year  . Active Member of Clubs or Organizations: No  . Attends Archivist Meetings: Never  . Marital Status: Married  Human resources officer Violence: Not At Risk  . Fear of Current or Ex-Partner: No  . Emotionally Abused: No  . Physically Abused: No  . Sexually Abused: No    Past Surgical History:  Procedure Laterality Date  .  Breast Biopsy     dense breast tissue  . BREAST EXCISIONAL BIOPSY Left 2000   benign  . CHOLECYSTECTOMY    . EUS  04/04/2012   Procedure: UPPER ENDOSCOPIC ULTRASOUND (EUS) LINEAR;  Surgeon: Milus Banister, MD;  Location: [facility];  Service: Endoscopy;  Laterality: N/A;  radial linear  . KNEE ARTHROPLASTY     total right  02-28-2010 Dr Pilar Plate Aluisio, left  . KNEE ARTHROPLASTY Bilateral 09/12/2017   Hector Shade  . LEFT HEART CATH AND CORONARY ANGIOGRAPHY N/A 04/20/2017   Procedure: LEFT HEART CATH AND CORONARY ANGIOGRAPHY;  Surgeon: Burnell Blanks, MD;  Location: [facility];  Service: Cardiovascular;  Laterality: N/A;  . TOTAL HIP ARTHROPLASTY Right 11/12/2019   Procedure: TOTAL HIP ARTHROPLASTY ANTERIOR APPROACH;  Surgeon: Gaynelle Arabian, MD;  Location: [facility];  Service: Orthopedics;  Laterality: Right;  187min  . TUBAL LIGATION      Family History  Problem Relation Age of Onset  . Heart disease Mother   . Hypertension Mother   . Heart attack Mother 60       MULTIPLES OVER TIME  . Melanoma Daughter 49       METASTATIC  . Breast cancer Sister   . Hypertension Father   . Hypertension Sister     Allergies  Allergen Reactions  . Sulfa Antibiotics      Kathreen Cosier syndrome  . Sulfonamide Derivatives     Kathreen Cosier syndrome    Current Outpatient Medications on File Prior to Visit  Medication Sig Dispense Refill  . Accu-Chek FastClix Lancets MISC USE   TO CHECK GLUCOSE TWICE DAILY 102 each 3  . ACCU-CHEK GUIDE test strip Use to test blood glucose three daily 300 each 3  . acetaminophen (TYLENOL) 325 MG tablet Take 650 mg by mouth every 6 (six) hours as needed for moderate pain.    Marland Kitchen ALPRAZolam (XANAX) 0.5 MG tablet TAKE 1/2 TO 1 TABLET BY  MOUTH AT BEDTIME AS NEEDED  FOR SLEEP 30 tablet 2  . atorvastatin (LIPITOR) 80 MG tablet TAKE 1 TABLET BY MOUTH  DAILY 90 tablet 0  . CARTIA XT 180 MG 24 hr capsule TAKE 1 CAPSULE BY MOUTH  DAILY 90 capsule 3  .  conjugated estrogens (PREMARIN) vaginal cream Place 0.5 Applicatorfuls vaginally every 7 (seven) days. 42.5 g 6  . denosumab (PROLIA) 60 MG/ML SOSY injection Inject 60 mg into the skin every 6 (six) months.    . diltiazem (CARDIZEM CD) 240 MG 24 hr capsule TAKE 1 CAPSULE(240 MG) BY MOUTH DAILY 90 capsule 3  . furosemide (LASIX) 20 MG tablet TAKE 1 TABLET BY MOUTH  TWICE DAILY 180 tablet 3  . gabapentin (NEURONTIN) 300 MG  capsule gabapentin 300 mg capsule  Take 1 capsule every day by oral route at bedtime for 30 days.    Marland Kitchen JARDIANCE 10 MG TABS tablet TAKE 1 TABLET BY MOUTH  DAILY BEFORE BREAKFAST 90 tablet 0  . levothyroxine (SYNTHROID) 50 MCG tablet TAKE 1 TABLET BY MOUTH  DAILY BEFORE BREAKFAST 90 tablet 3  . nitroGLYCERIN (NITROSTAT) 0.4 MG SL tablet DISSOLVE 1 TABLET UNDER THE TONGUE EVERY 5 MINUTES AS  NEEDED FOR CHEST PAIN. MAX  3 DOSES IN 15 MINUTES. CALL 911 IF CHEST PAIN PERSISTS 100 tablet 6  . potassium chloride SA (K-DUR) 20 MEQ tablet Take 1 tablet (20 mEq total) by mouth 2 (two) times daily. (Patient taking differently: Take 20 mEq by mouth daily.) 180 tablet 3  . traZODone (DESYREL) 50 MG tablet TAKE 1/2 TO 1 TABLET BY  MOUTH AT BEDTIME AS NEEDED  FOR SLEEP 90 tablet 1   No current facility-administered medications on file prior to visit.    BP 124/70   Temp 98.4 F (36.9 C)   Wt 140 lb (63.5 kg)   BMI 28.28 kg/m       Objective:   Physical Exam Vitals and nursing note reviewed.  Constitutional:      Appearance: Normal appearance.  Cardiovascular:     Rate and Rhythm: Normal rate and regular rhythm.     Pulses: Normal pulses.     Heart sounds: Normal heart sounds.  Skin:    General: Skin is warm and dry.  Neurological:     General: No focal deficit present.     Mental Status: She is alert and oriented to person, place, and time.  Psychiatric:        Mood and Affect: Mood normal.        Behavior: Behavior normal.        Thought Content: Thought content normal.         Judgment: Judgment normal.       Assessment & Plan:  1. Diabetes mellitus without complication (HCC)  - POCT A1C- 7.6 -has increased to no longer at goal.  Wondering if this is partially due to increased cortisol levels from chronic pain and it could also be decreased insulin production by the pancreas.  Gave her 3 weeks of samples of Jardiance 25 mg to trial at home.  She is also interested in using the [place] system so I will send in this as well.  Follow-up in 3 months or sooner if needed  2. Hypothyroidism, unspecified type - Consider dose change of synthroid  - TSH; Future - TSH  [name], NP

## 2020-09-23 DIAGNOSIS — Z96641 Presence of right artificial hip joint: Secondary | ICD-10-CM | POA: Diagnosis not present

## 2020-09-25 ENCOUNTER — Encounter: Payer: Self-pay | Admitting: Adult Health

## 2020-10-06 NOTE — Progress Notes (Signed)
Cardiology Office Note    Date:  10/13/2020   ID:  Rebecca [name], Rebecca [name] Mar 13, 1946, MRN [id number]   PCP:  [name], NP   [facility]  Cardiologist:  Ena Dawley, MD  Advanced Practice Provider:  [name], PA-C Electrophysiologist:  None   [id number]   Chief Complaint  Patient presents with  . 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  Also has hyperlipidemia, HTN, chronic diastolic CHF with lower extremity edema controlled with low-dose Lasix.    I last saw the patient 08/28/18 and was complaining of feeling jittery inside.  2-week monitor showed sinus bradycardia down to 44 bpm to sinus tachycardia with infrequent PACs, PVCs and few very short runs of SVT.  Could not increase medication because of bradycardia.  2D echo 09/04/2018 normal LVEF 55 to 60% with some DD.   I last saw the patient 10/29/2019 for surgical clearance before undergoing hip surgery.   Patient comes in for f/u. Doing well on diltiazem. Legs started swelling about 5 months ago. Moved into independent living at [facility]. May be getting extra salt at the facility. Occasionally takes an extra lasix if needed. Exercises in classes 1-2 times daily. Having some tendonitis of her hip. A1C high 7.6 and now on Jardiance. No chest pain, palpitations, dyspnea. Had a vitreous detachment last week.    Past Medical History:  Diagnosis Date  . Asthma    extrinsic  . CAD (coronary artery disease)   . Chest pain, atypical   . Complication of anesthesia   . Diabetes ([place])   . Edema   . Fatigue   . Fatigue   . GERD (gastroesophageal reflux disease)   . Hyperlipidemia   . Hypertension    familiar  . Hypothyroidism   . IBS (irritable bowel syndrome)    hx  . Keratosis,  seborrheic   . Labyrinthitis   . MI (myocardial infarction) ([place])    age 60  . Osteoarthritis   . PONV (postoperative nausea and vomiting)   . S/P arthroscopy of right shoulder 03/01/2018  . Skin cancer    Melanoma     Past Surgical History:  Procedure Laterality Date  . Breast Biopsy     dense breast tissue  . BREAST EXCISIONAL BIOPSY Left 2000   benign  . CHOLECYSTECTOMY    . EUS  04/04/2012   Procedure: UPPER ENDOSCOPIC ULTRASOUND (EUS) LINEAR;  Surgeon: Milus Banister, MD;  [facility];  Service: Endoscopy;  Laterality: N/A;  radial linear  . KNEE ARTHROPLASTY     total right  02-28-2010 Dr Pilar Plate Aluisio, left  . KNEE ARTHROPLASTY Bilateral 09/12/2017   Hector Shade  . LEFT HEART CATH AND CORONARY ANGIOGRAPHY N/A 04/20/2017   Procedure: LEFT HEART CATH AND CORONARY ANGIOGRAPHY;  Surgeon: Burnell Blanks, MD;  Location: [facility];  Service: Cardiovascular;  Laterality: N/A;  . TOTAL HIP ARTHROPLASTY Right 11/12/2019   Procedure: TOTAL HIP ARTHROPLASTY ANTERIOR APPROACH;  Surgeon: Gaynelle Arabian, MD;  [facility];  Service: Orthopedics;  Laterality: Right;  112min  . TUBAL LIGATION      Current Medications: Current Meds  Medication Sig  . Accu-Chek FastClix Lancets MISC USE   TO CHECK GLUCOSE TWICE DAILY  . ACCU-CHEK GUIDE  test strip Use to test blood glucose three daily  . acetaminophen (TYLENOL) 325 MG tablet Take 650 mg by mouth every 6 (six) hours as needed for moderate pain.  Marland Kitchen ALPRAZolam (XANAX) 0.5 MG tablet TAKE 1/2 TO 1 TABLET BY  MOUTH AT BEDTIME AS NEEDED  FOR SLEEP  . atorvastatin (LIPITOR) 80 MG tablet TAKE 1 TABLET BY MOUTH  DAILY  . conjugated estrogens (PREMARIN) vaginal cream Place 0.5 Applicatorfuls vaginally every 7 (seven) days.  . Continuous Blood Gluc Sensor (FREESTYLE LIBRE 2 SENSOR) MISC Use with libre app  . denosumab (PROLIA) 60 MG/ML SOSY injection Inject 60 mg into the skin every 6 (six) months.  . diltiazem  (CARDIZEM CD) 240 MG 24 hr capsule TAKE 1 CAPSULE(240 MG) BY MOUTH DAILY  . furosemide (LASIX) 20 MG tablet TAKE 1 TABLET BY MOUTH  TWICE DAILY  . gabapentin (NEURONTIN) 300 MG capsule gabapentin 300 mg capsule  Take 1 capsule every day by oral route at bedtime for 30 days.  Marland Kitchen JARDIANCE 10 MG TABS tablet TAKE 1 TABLET BY MOUTH  DAILY BEFORE BREAKFAST (Patient taking differently: 20 mg daily.)  . levothyroxine (SYNTHROID) 50 MCG tablet TAKE 1 TABLET BY MOUTH  DAILY BEFORE BREAKFAST  . meloxicam (MOBIC) 15 MG tablet meloxicam 15 mg tablet  Take 1 tablet every day by oral route.  . nitroGLYCERIN (NITROSTAT) 0.4 MG SL tablet DISSOLVE 1 TABLET UNDER THE TONGUE EVERY 5 MINUTES AS  NEEDED FOR CHEST PAIN. MAX  3 DOSES IN 15 MINUTES. CALL 911 IF CHEST PAIN PERSISTS  . potassium chloride SA (K-DUR) 20 MEQ tablet Take 1 tablet (20 mEq total) by mouth 2 (two) times daily. (Patient taking differently: Take 20 mEq by mouth daily.)  . traZODone (DESYREL) 50 MG tablet TAKE 1/2 TO 1 TABLET BY  MOUTH AT BEDTIME AS NEEDED  FOR SLEEP  . [DISCONTINUED] CARTIA XT 180 MG 24 hr capsule TAKE 1 CAPSULE BY MOUTH  DAILY     Allergies:   Sulfa antibiotics and Sulfonamide derivatives   Social History   Socioeconomic History  . Marital status: Married    Spouse name: Not on file  . Number of children: 2  . Years of education: Not on file  . Highest education level: Not on file  Occupational History  . Occupation: RN-retired    Employer: RETIRED  Tobacco Use  . Smoking status: Never Smoker  . Smokeless tobacco: Never Used  Vaping Use  . Vaping Use: Never used  Substance and Sexual Activity  . Alcohol use: No    Comment: rarely  . Drug use: No  . Sexual activity: Not on file  Other Topics Concern  . Not on file  Social History Narrative   ** Merged History Encounter **       Social Determinants of Health   Financial Resource Strain: Low Risk   . Difficulty of Paying Living Expenses: Not hard at all   Food Insecurity: No Food Insecurity  . Worried About Charity fundraiser in the Last Year: Never true  . Ran Out of Food in the Last Year: Never true  Transportation Needs: No Transportation Needs  . Lack of Transportation (Medical): No  . Lack of Transportation (Non-Medical): No  Physical Activity: Inactive  . Days of Exercise per Week: 0 days  . Minutes of Exercise per Session: 0 min  Stress: No Stress Concern Present  . Feeling of Stress : Not at all  Social Connections: Moderately Integrated  . Frequency  of Communication with Friends and Family: More than three times a week  . Frequency of Social Gatherings with Friends and Family: More than three times a week  . Attends Religious Services: More than 4 times per year  . Active Member of Clubs or Organizations: No  . Attends Archivist Meetings: Never  . Marital Status: Married     Family History:  The patient's family history includes Breast cancer in her sister; Heart attack (age of onset: 83) in her mother; Heart disease in her mother; Hypertension in her father, mother, and sister; Melanoma (age of onset: 24) in her daughter.   ROS:   Please see the history of present illness.    ROS All other systems reviewed and are negative.   PHYSICAL EXAM:   VS:  BP 128/62   Pulse 65   Ht 4\' 11"  (1.499 m)   Wt 138 lb 6.4 oz (62.8 kg)   SpO2 95%   BMI 27.95 kg/m   Physical Exam  GEN: Well nourished, well developed, in no acute distress  Neck: no JVD, carotid bruits, or masses Cardiac:RRR; no murmurs, rubs, or gallops  Respiratory:  clear to auscultation bilaterally, normal work of breathing GI: soft, nontender, nondistended, + BS Ext: without cyanosis, clubbing, or edema, Good distal pulses bilaterally Neuro:  Alert and Oriented x 3, Psych: euthymic mood, full affect  Wt Readings from Last 3 Encounters:  10/13/20 138 lb 6.4 oz (62.8 kg)  09/03/20 140 lb (63.5 kg)  12/19/19 136 lb (61.7 kg)      Studies/Labs  Reviewed:   EKG:  EKG is ordered today.  The ekg ordered today demonstrates NSR with inf Q waves and TWI unchanged  Recent Labs: 11/05/2019: ALT 24 11/13/2019: BUN 21; Creatinine, Ser 0.67; Hemoglobin 13.1; Platelets 225; Potassium 4.3; Sodium 134 09/03/2020: TSH 1.58   Lipid Panel    Component Value Date/Time   CHOL 167 11/05/2019 1025   TRIG 123.0 11/05/2019 1025   HDL 55.00 11/05/2019 1025   CHOLHDL 3 11/05/2019 1025   VLDL 24.6 11/05/2019 1025   LDLCALC 87 11/05/2019 1025    Additional studies/ records that were reviewed today include:  2D echo 09/04/2018 IMPRESSIONS     1. The left ventricle has normal systolic function, with an ejection  fraction of 55-60%. The cavity size was normal. There is mildly increased  left ventricular wall thickness. Left ventricular diastolic Doppler  parameters are consistent with  pseudonormalization No evidence of left ventricular regional wall motion  abnormalities.   2. The right ventricle has normal systolic function. The cavity was  normal. There is no increase in right ventricular wall thickness.   3. The mitral valve is normal in structure. No evidence of mitral valve  stenosis. Trivial regurgitation.   4. The tricuspid valve is normal in structure.   5. The aortic valve is tricuspid no stenosis of the aortic valve.   6. The pulmonic valve was normal in structure.   7. The aortic root and ascending aorta are normal in size and structure.   8. IVC normal in size, PA systolic pressure 26 mmHg.    Holter monitor 09/04/2018  Sinus bradycardia to sinus tachycardia.  Infrequent PACs, PVCs. Few very short SVT runs.   Sinus bradycardia to sinus tachycardia. Infrequent PACs, PVCs. Few very short SVT runs.          Risk Assessment/Calculations:         ASSESSMENT:    1. Coronary artery disease involving  native coronary artery of native heart without angina pectoris   2. Palpitations   3. Chronic diastolic CHF (congestive  heart failure) ([place])   4. Essential hypertension   5. Hyperlipidemia, unspecified hyperlipidemia type      PLAN:  In order of problems listed above:   CAD status post PCI to the RCA in 2010, last cath 04/2017 nonobstructive CAD with normal LVEF medical management recommended-no angina   History of palpitations 2-week monitor 09/04/2018 sinus bradycardia to sinus tachycardia with infrequent PACs, PVCs and very short runs of SVT.Increase in palpitations and increase diltiazem 240 mg daily has helped alot.   Chronic diastolic CHF 2D echo 12/3873 normal LVEF with diastolic dysfunction-increase edema over the past 5 months. No longer cooking so probably getting extra salt and some weight gain. Can take an extra lasix prn. Update echo and check labs   Hypertension controlled   Hyperlipidemia on lipitor-check FLP today      Shared Decision Making/Informed Consent        Medication Adjustments/Labs and Tests Ordered: Current medicines are reviewed at length with the patient today.  Concerns regarding medicines are outlined above.  Medication changes, Labs and Tests ordered today are listed in the Patient Instructions below. Patient Instructions  Medication Instructions:  Your physician recommends that you continue on your current medications as directed. Please refer to the Current Medication list given to you today. *If you need a refill on your cardiac medications before your next appointment, please call your pharmacy*   Lab Work: Fasting lipids, CMET, CBC and BNP today If you have labs (blood work) drawn today and your tests are completely normal, you will receive your results only by: Marland Kitchen MyChart Message (if you have MyChart) OR . A paper copy in the mail If you have any lab test that is abnormal or we need to change your treatment, we will call you to review the results.   Testing/Procedures: Your physician has requested that you have an echocardiogram. Echocardiography is a  painless test that uses sound waves to create images of your heart. It provides your doctor with information about the size and shape of your heart and how well your heart's chambers and valves are working. This procedure takes approximately one hour. There are no restrictions for this procedure.   Follow-Up: At [facility] you and your health needs are our priority.  As part of our continuing mission to provide you with exceptional heart care, we have created designated Provider Care Teams.  These Care Teams include your primary Cardiologist (physician) and Advanced Practice Providers (APPs -  Physician Assistants and Nurse Practitioners) who all work together to provide you with the care you need, when you need it.   Your next appointment:   1 year(s)  The format for your next appointment:   In Person  Provider:   Gwyndolyn Kaufman, MD        Signed, Ermalinda Barrios, PA-C  10/13/2020 8:54 AM    [facility] [address], [place], [place]  [address] Phone: [phone]; Fax: [phone]

## 2020-10-07 ENCOUNTER — Telehealth: Payer: Self-pay | Admitting: *Deleted

## 2020-10-07 NOTE — Telephone Encounter (Signed)
Left message with husband.  Okay for patient to schedule Prolia after 10/21/20.

## 2020-10-11 DIAGNOSIS — H43812 Vitreous degeneration, left eye: Secondary | ICD-10-CM | POA: Diagnosis not present

## 2020-10-11 NOTE — Telephone Encounter (Signed)
Pt called in and has been scheduled 10/25/2020

## 2020-10-13 ENCOUNTER — Ambulatory Visit (INDEPENDENT_AMBULATORY_CARE_PROVIDER_SITE_OTHER): Payer: Medicare Other | Admitting: Physician Assistant

## 2020-10-13 ENCOUNTER — Other Ambulatory Visit: Payer: Self-pay

## 2020-10-13 ENCOUNTER — Encounter: Payer: Self-pay | Admitting: Physician Assistant

## 2020-10-13 VITALS — BP 128/62 | HR 65 | Ht 59.0 in | Wt 138.4 lb

## 2020-10-13 DIAGNOSIS — R609 Edema, unspecified: Secondary | ICD-10-CM

## 2020-10-13 DIAGNOSIS — R002 Palpitations: Secondary | ICD-10-CM

## 2020-10-13 DIAGNOSIS — E785 Hyperlipidemia, unspecified: Secondary | ICD-10-CM | POA: Diagnosis not present

## 2020-10-13 DIAGNOSIS — I2510 Atherosclerotic heart disease of native coronary artery without angina pectoris: Secondary | ICD-10-CM | POA: Diagnosis not present

## 2020-10-13 DIAGNOSIS — I10 Essential (primary) hypertension: Secondary | ICD-10-CM | POA: Diagnosis not present

## 2020-10-13 DIAGNOSIS — I5032 Chronic diastolic (congestive) heart failure: Secondary | ICD-10-CM

## 2020-10-13 NOTE — Patient Instructions (Signed)
Medication Instructions:  Your physician recommends that you continue on your current medications as directed. Please refer to the Current Medication list given to you today. *If you need a refill on your cardiac medications before your next appointment, please call your pharmacy*   Lab Work: Fasting lipids, CMET, CBC and BNP today If you have labs (blood work) drawn today and your tests are completely normal, you will receive your results only by: Marland Kitchen MyChart Message (if you have MyChart) OR . A paper copy in the mail If you have any lab test that is abnormal or we need to change your treatment, we will call you to review the results.   Testing/Procedures: Your physician has requested that you have an echocardiogram. Echocardiography is a painless test that uses sound waves to create images of your heart. It provides your doctor with information about the size and shape of your heart and how well your heart's chambers and valves are working. This procedure takes approximately one hour. There are no restrictions for this procedure.   Follow-Up: At [facility], you and your health needs are our priority.  As part of our continuing mission to provide you with exceptional heart care, we have created designated Provider Care Teams.  These Care Teams include your primary Cardiologist (physician) and Advanced Practice Providers (APPs -  Physician Assistants and Nurse Practitioners) who all work together to provide you with the care you need, when you need it.   Your next appointment:   1 year(s)  The format for your next appointment:   In Person  Provider:   Gwyndolyn Kaufman, MD

## 2020-10-14 LAB — CBC
Hematocrit: 44.5 % (ref 34.0–46.6)
Hemoglobin: 14.9 g/dL (ref 11.1–15.9)
MCH: 31 pg (ref 26.6–33.0)
MCHC: 33.5 g/dL (ref 31.5–35.7)
MCV: 93 fL (ref 79–97)
Platelets: 255 10*3/uL (ref 150–450)
RBC: 4.81 x10E6/uL (ref 3.77–5.28)
RDW: 12.3 % (ref 11.7–15.4)
WBC: 5.4 10*3/uL (ref 3.4–10.8)

## 2020-10-14 LAB — COMPREHENSIVE METABOLIC PANEL
ALT: 28 IU/L (ref 0–32)
AST: 23 IU/L (ref 0–40)
Albumin/Globulin Ratio: 2.3 — ABNORMAL HIGH (ref 1.2–2.2)
Albumin: 4.6 g/dL (ref 3.7–4.7)
Alkaline Phosphatase: 83 IU/L (ref 44–121)
BUN/Creatinine Ratio: 20 (ref 12–28)
BUN: 19 mg/dL (ref 8–27)
Bilirubin Total: 0.3 mg/dL (ref 0.0–1.2)
CO2: 22 mmol/L (ref 20–29)
Calcium: 10.2 mg/dL (ref 8.7–10.3)
Chloride: 102 mmol/L (ref 96–106)
Creatinine, Ser: 0.97 mg/dL (ref 0.57–1.00)
Globulin, Total: 2 g/dL (ref 1.5–4.5)
Glucose: 162 mg/dL — ABNORMAL HIGH (ref 65–99)
Potassium: 4.5 mmol/L (ref 3.5–5.2)
Sodium: 143 mmol/L (ref 134–144)
Total Protein: 6.6 g/dL (ref 6.0–8.5)
eGFR: 61 mL/min/{1.73_m2} (ref >59)

## 2020-10-14 LAB — LIPID PANEL
Chol/HDL Ratio: 2.6 ratio (ref 0.0–4.4)
Cholesterol, Total: 155 mg/dL (ref 100–199)
HDL: 60 mg/dL (ref >39)
LDL Chol Calc (NIH): 78 mg/dL (ref 0–99)
Triglycerides: 92 mg/dL (ref 0–149)
VLDL Cholesterol Cal: 17 mg/dL (ref 5–40)

## 2020-10-14 LAB — PRO B NATRIURETIC PEPTIDE: NT-Pro BNP: 108 pg/mL (ref 0–738)

## 2020-10-25 ENCOUNTER — Other Ambulatory Visit: Payer: Self-pay

## 2020-10-25 ENCOUNTER — Ambulatory Visit (INDEPENDENT_AMBULATORY_CARE_PROVIDER_SITE_OTHER): Payer: Medicare Other | Admitting: *Deleted

## 2020-10-25 DIAGNOSIS — M810 Age-related osteoporosis without current pathological fracture: Secondary | ICD-10-CM

## 2020-10-25 MED ORDER — DENOSUMAB 60 MG/ML ~~LOC~~ SOSY
60.0000 mg | PREFILLED_SYRINGE | Freq: Once | SUBCUTANEOUS | Status: AC
  Administered 2020-10-25: 60 mg via SUBCUTANEOUS

## 2020-10-25 NOTE — Progress Notes (Signed)
Per orders of Dr. Sarajane Jews, injection of Prolia 34m given by FAgnes Lawrence Patient tolerated injection well.

## 2020-11-03 DIAGNOSIS — Z23 Encounter for immunization: Secondary | ICD-10-CM | POA: Diagnosis not present

## 2020-11-09 DIAGNOSIS — H43812 Vitreous degeneration, left eye: Secondary | ICD-10-CM | POA: Diagnosis not present

## 2020-11-11 ENCOUNTER — Other Ambulatory Visit: Payer: Self-pay | Admitting: Adult Health

## 2020-11-11 DIAGNOSIS — Z1231 Encounter for screening mammogram for malignant neoplasm of breast: Secondary | ICD-10-CM

## 2020-11-12 ENCOUNTER — Other Ambulatory Visit: Payer: Self-pay

## 2020-11-12 ENCOUNTER — Ambulatory Visit (HOSPITAL_COMMUNITY): Payer: Medicare Other | Attending: Physician Assistant

## 2020-11-12 DIAGNOSIS — I5032 Chronic diastolic (congestive) heart failure: Secondary | ICD-10-CM

## 2020-11-12 DIAGNOSIS — R609 Edema, unspecified: Secondary | ICD-10-CM | POA: Insufficient documentation

## 2020-11-12 LAB — ECHOCARDIOGRAM COMPLETE
Area-P 1/2: 2.46 cm2
S' Lateral: 2.7 cm

## 2020-11-15 ENCOUNTER — Telehealth: Payer: Self-pay | Admitting: Cardiology

## 2020-11-15 NOTE — Telephone Encounter (Signed)
Patient called to get results from doctor or nurse

## 2020-11-15 NOTE — Telephone Encounter (Signed)
Returned call to pt.  She has been made aware of her echocardiogram results and verbalized understanding.

## 2020-11-15 NOTE — Telephone Encounter (Signed)
-----   Message from [name], [place] sent at 11/15/2020  7:42 AM EDT ----- Echo looks good with normal heart function, heart has trouble relaxing similar to before. Limit salt, no changes, thanks

## 2020-12-01 ENCOUNTER — Other Ambulatory Visit: Payer: Self-pay | Admitting: Adult Health

## 2020-12-01 DIAGNOSIS — E782 Mixed hyperlipidemia: Secondary | ICD-10-CM

## 2020-12-01 DIAGNOSIS — I2510 Atherosclerotic heart disease of native coronary artery without angina pectoris: Secondary | ICD-10-CM

## 2020-12-01 DIAGNOSIS — I10 Essential (primary) hypertension: Secondary | ICD-10-CM

## 2020-12-01 DIAGNOSIS — Z760 Encounter for issue of repeat prescription: Secondary | ICD-10-CM

## 2020-12-20 ENCOUNTER — Other Ambulatory Visit: Payer: Self-pay

## 2020-12-21 ENCOUNTER — Ambulatory Visit (INDEPENDENT_AMBULATORY_CARE_PROVIDER_SITE_OTHER): Payer: Medicare Other | Admitting: Adult Health

## 2020-12-21 ENCOUNTER — Encounter: Payer: Self-pay | Admitting: Adult Health

## 2020-12-21 VITALS — BP 100/70 | HR 78 | Temp 97.9°F | Ht <= 58 in | Wt 138.0 lb

## 2020-12-21 DIAGNOSIS — E1140 Type 2 diabetes mellitus with diabetic neuropathy, unspecified: Secondary | ICD-10-CM

## 2020-12-21 DIAGNOSIS — I10 Essential (primary) hypertension: Secondary | ICD-10-CM

## 2020-12-21 DIAGNOSIS — E782 Mixed hyperlipidemia: Secondary | ICD-10-CM | POA: Diagnosis not present

## 2020-12-21 DIAGNOSIS — I2510 Atherosclerotic heart disease of native coronary artery without angina pectoris: Secondary | ICD-10-CM

## 2020-12-21 DIAGNOSIS — E039 Hypothyroidism, unspecified: Secondary | ICD-10-CM

## 2020-12-21 DIAGNOSIS — G479 Sleep disorder, unspecified: Secondary | ICD-10-CM

## 2020-12-21 DIAGNOSIS — E119 Type 2 diabetes mellitus without complications: Secondary | ICD-10-CM

## 2020-12-21 DIAGNOSIS — G629 Polyneuropathy, unspecified: Secondary | ICD-10-CM

## 2020-12-21 LAB — CBC WITH DIFFERENTIAL/PLATELET
Basophils Absolute: 0 10*3/uL (ref 0.0–0.1)
Basophils Relative: 0.2 % (ref 0.0–3.0)
Eosinophils Absolute: 0.1 10*3/uL (ref 0.0–0.7)
Eosinophils Relative: 1.8 % (ref 0.0–5.0)
HCT: 45.3 % (ref 36.0–46.0)
Hemoglobin: 15.4 g/dL — ABNORMAL HIGH (ref 12.0–15.0)
Lymphocytes Relative: 23.4 % (ref 12.0–46.0)
Lymphs Abs: 1.6 10*3/uL (ref 0.7–4.0)
MCHC: 34 g/dL (ref 30.0–36.0)
MCV: 92.2 fl (ref 78.0–100.0)
Monocytes Absolute: 0.4 10*3/uL (ref 0.1–1.0)
Monocytes Relative: 6.4 % (ref 3.0–12.0)
Neutro Abs: 4.5 10*3/uL (ref 1.4–7.7)
Neutrophils Relative %: 68.2 % (ref 43.0–77.0)
Platelets: 269 10*3/uL (ref 150.0–400.0)
RBC: 4.92 Mil/uL (ref 3.87–5.11)
RDW: 12.8 % (ref 11.5–15.5)
WBC: 6.6 10*3/uL (ref 4.0–10.5)

## 2020-12-21 LAB — COMPREHENSIVE METABOLIC PANEL
ALT: 36 U/L — ABNORMAL HIGH (ref 0–35)
AST: 22 U/L (ref 0–37)
Albumin: 4.8 g/dL (ref 3.5–5.2)
Alkaline Phosphatase: 73 U/L (ref 39–117)
BUN: 36 mg/dL — ABNORMAL HIGH (ref 6–23)
CO2: 27 mEq/L (ref 19–32)
Calcium: 11.3 mg/dL — ABNORMAL HIGH (ref 8.4–10.5)
Chloride: 100 mEq/L (ref 96–112)
Creatinine, Ser: 0.98 mg/dL (ref 0.40–1.20)
GFR: 56 mL/min — ABNORMAL LOW (ref >60.00)
Glucose, Bld: 148 mg/dL — ABNORMAL HIGH (ref 70–99)
Potassium: 4 mEq/L (ref 3.5–5.1)
Sodium: 141 mEq/L (ref 135–145)
Total Bilirubin: 0.4 mg/dL (ref 0.2–1.2)
Total Protein: 6.9 g/dL (ref 6.0–8.3)

## 2020-12-21 LAB — TSH: TSH: 1.79 u[IU]/mL (ref 0.35–4.50)

## 2020-12-21 LAB — HEMOGLOBIN A1C: Hgb A1c MFr Bld: 7.4 % — ABNORMAL HIGH (ref 4.6–6.5)

## 2020-12-21 LAB — VITAMIN B12: Vitamin B-12: 255 pg/mL (ref 211–911)

## 2020-12-21 LAB — MICROALBUMIN / CREATININE URINE RATIO
Creatinine,U: 17.9 mg/dL
Microalb Creat Ratio: 3.9 mg/g (ref 0.0–30.0)
Microalb, Ur: <0.7 mg/dL (ref 0.0–1.9)

## 2020-12-21 NOTE — Progress Notes (Signed)
Subjective:    Patient ID: Rebecca [name], female    DOB: Oct 11, 1945, 75 y.o.   MRN: [id number]  HPI Patient presents for yearly preventative medicine examination. She is a pleasant 75 year old female who  has a past medical history of Asthma, CAD (coronary artery disease), Chest pain, atypical, Complication of anesthesia, Diabetes ([place]), Edema, Fatigue, Fatigue, GERD (gastroesophageal reflux disease), Hyperlipidemia, Hypertension, Hypothyroidism, IBS (irritable bowel syndrome), Keratosis, seborrheic, Labyrinthitis, MI (myocardial infarction) ([place]), Osteoarthritis, PONV (postoperative nausea and vomiting), S/P arthroscopy of right shoulder (03/01/2018), and Skin cancer.  DM -was last seen in February at which time her A1c increased from 6.9-7.6. Jardiance was increased to 20 mg at this time.  She reports that her blood sugars have been in the 132 160 range with most of her blood sugars in the 150s to 160s.  Reports worsening neuropathy in her left foot.  She is working out quite often at her independent living facility and tries to eat healthy Lab Results  Component Value Date   HGBA1C 7.6 (A) 09/03/2020   CAD-status post PCI to RCA in 2010, last cath in October 2018 showed nonobstructive CAD with normal LVEF.  Currently medical management with cardiology Lipitor 80 mg daily Lab Results  Component Value Date   CHOL 155 10/13/2020   HDL 60 10/13/2020   LDLCALC 78 10/13/2020   TRIG 92 10/13/2020   CHOLHDL 2.6 10/13/2020    Palpitations -currently prescribed diltiazem 240 mg daily which is helped significantly with her palpitations  Chronic Diastolic CHF -20 mg twice daily  Hypothyroidism -controlled with Synthroid 50 mcg daily. Reports dry skin, hair, and nails.   Insomnia - takes Tradosone 25-50 mg PRN  All immunizations and health maintenance protocols were reviewed with the patient and needed orders were placed.  Appropriate screening laboratory values were ordered for the patient  including screening of hyperlipidemia, renal function and hepatic function.  Medication reconciliation,  past medical history, social history, problem list and allergies were reviewed in detail with the patient  Goals were established with regard to weight loss, exercise, and  diet in compliance with medications   Review of Systems  Constitutional: Negative.   HENT: Negative.   Eyes: Negative.   Respiratory: Negative.   Cardiovascular: Negative.   Gastrointestinal: Negative.   Endocrine: Negative.   Genitourinary: Negative.   Musculoskeletal: Positive for arthralgias.  Skin: Negative.   Allergic/Immunologic: Negative.   Neurological: Negative.   Hematological: Negative.   Psychiatric/Behavioral: Negative.      Past Medical History:  Diagnosis Date  . Asthma    extrinsic  . CAD (coronary artery disease)   . Chest pain, atypical   . Complication of anesthesia   . Diabetes ([place])   . Edema   . Fatigue   . Fatigue   . GERD (gastroesophageal reflux disease)   . Hyperlipidemia   . Hypertension    familiar  . Hypothyroidism   . IBS (irritable bowel syndrome)    hx  . Keratosis, seborrheic   . Labyrinthitis   . MI (myocardial infarction) ([place])    age 40  . Osteoarthritis   . PONV (postoperative nausea and vomiting)   . S/P arthroscopy of right shoulder 03/01/2018  . Skin cancer    Melanoma     Social History   Socioeconomic History  . Marital status: Married    Spouse name: Not on file  . Number of children: 2  . Years of education: Not on file  . Highest  education level: Not on file  Occupational History  . Occupation: RN-retired    Employer: RETIRED  Tobacco Use  . Smoking status: Never Smoker  . Smokeless tobacco: Never Used  Vaping Use  . Vaping Use: Never used  Substance and Sexual Activity  . Alcohol use: No    Comment: rarely  . Drug use: No  . Sexual activity: Not on file  Other Topics Concern  . Not on file  Social History Narrative   **  Merged History Encounter **       Social Determinants of Health   Financial Resource Strain: Low Risk   . Difficulty of Paying Living Expenses: Not hard at all  Food Insecurity: No Food Insecurity  . Worried About Charity fundraiser in the Last Year: Never true  . Ran Out of Food in the Last Year: Never true  Transportation Needs: No Transportation Needs  . Lack of Transportation (Medical): No  . Lack of Transportation (Non-Medical): No  Physical Activity: Inactive  . Days of Exercise per Week: 0 days  . Minutes of Exercise per Session: 0 min  Stress: No Stress Concern Present  . Feeling of Stress : Not at all  Social Connections: Moderately Integrated  . Frequency of Communication with Friends and Family: More than three times a week  . Frequency of Social Gatherings with Friends and Family: More than three times a week  . Attends Religious Services: More than 4 times per year  . Active Member of Clubs or Organizations: No  . Attends Archivist Meetings: Never  . Marital Status: Married  Human resources officer Violence: Not At Risk  . Fear of Current or Ex-Partner: No  . Emotionally Abused: No  . Physically Abused: No  . Sexually Abused: No    Past Surgical History:  Procedure Laterality Date  . Breast Biopsy     dense breast tissue  . BREAST EXCISIONAL BIOPSY Left 2000   benign  . CHOLECYSTECTOMY    . EUS  04/04/2012   Procedure: UPPER ENDOSCOPIC ULTRASOUND (EUS) LINEAR;  Surgeon: Milus Banister, MD;  Location: [facility];  Service: Endoscopy;  Laterality: N/A;  radial linear  . KNEE ARTHROPLASTY     total right  02-28-2010 Dr Pilar Plate Aluisio, left  . KNEE ARTHROPLASTY Bilateral 09/12/2017   Hector Shade  . LEFT HEART CATH AND CORONARY ANGIOGRAPHY N/A 04/20/2017   Procedure: LEFT HEART CATH AND CORONARY ANGIOGRAPHY;  Surgeon: Burnell Blanks, MD;  Location: [place] [facility];  Service: Cardiovascular;  Laterality: N/A;  . TOTAL HIP ARTHROPLASTY Right  11/12/2019   Procedure: TOTAL HIP ARTHROPLASTY ANTERIOR APPROACH;  Surgeon: Gaynelle Arabian, MD;  Location: [facility];  Service: Orthopedics;  Laterality: Right;  166min  . TUBAL LIGATION      Family History  Problem Relation Age of Onset  . Heart disease Mother   . Hypertension Mother   . Heart attack Mother 90       MULTIPLES OVER TIME  . Melanoma Daughter 43       METASTATIC  . Breast cancer Sister   . Hypertension Father   . Hypertension Sister     Allergies  Allergen Reactions  . Sulfa Antibiotics      Kathreen Cosier syndrome  . Sulfonamide Derivatives     Kathreen Cosier syndrome    Current Outpatient Medications on File Prior to Visit  Medication Sig Dispense Refill  . Accu-Chek FastClix Lancets MISC USE   TO CHECK GLUCOSE TWICE  DAILY 102 each 3  . ACCU-CHEK GUIDE test strip Use to test blood glucose three daily 300 each 3  . acetaminophen (TYLENOL) 325 MG tablet Take 650 mg by mouth every 6 (six) hours as needed for moderate pain.    Marland Kitchen ALPRAZolam (XANAX) 0.5 MG tablet TAKE 1/2 TO 1 TABLET BY  MOUTH AT BEDTIME AS NEEDED  FOR SLEEP 30 tablet 2  . atorvastatin (LIPITOR) 80 MG tablet TAKE 1 TABLET BY MOUTH  DAILY 90 tablet 3  . conjugated estrogens (PREMARIN) vaginal cream Place 0.5 Applicatorfuls vaginally every 7 (seven) days. 42.5 g 6  . Continuous Blood Gluc Sensor (FREESTYLE LIBRE 2 SENSOR) MISC Use with libre app 6 each 0  . denosumab (PROLIA) 60 MG/ML SOSY injection Inject 60 mg into the skin every 6 (six) months.    . diltiazem (CARDIZEM CD) 180 MG 24 hr capsule TAKE 1 CAPSULE BY MOUTH  DAILY 90 capsule 3  . diltiazem (CARDIZEM CD) 240 MG 24 hr capsule TAKE 1 CAPSULE(240 MG) BY MOUTH DAILY 90 capsule 3  . furosemide (LASIX) 20 MG tablet TAKE 1 TABLET BY MOUTH  TWICE DAILY 180 tablet 3  . gabapentin (NEURONTIN) 300 MG capsule gabapentin 300 mg capsule  Take 1 capsule every day by oral route at bedtime for 30 days.    Marland Kitchen JARDIANCE 10 MG TABS tablet TAKE 1 TABLET BY MOUTH   DAILY BEFORE BREAKFAST (Patient taking differently: 20 mg daily.) 90 tablet 0  . levothyroxine (SYNTHROID) 50 MCG tablet TAKE 1 TABLET BY MOUTH  DAILY BEFORE BREAKFAST 90 tablet 3  . meloxicam (MOBIC) 15 MG tablet meloxicam 15 mg tablet  Take 1 tablet every day by oral route.    . nitroGLYCERIN (NITROSTAT) 0.4 MG SL tablet DISSOLVE 1 TABLET UNDER THE TONGUE EVERY 5 MINUTES AS&nbsp;&nbsp;NEEDED FOR CHEST PAIN. MAX&nbsp;&nbsp;3 DOSES IN 15 MINUTES. CALL 911 IF CHEST PAIN PERSISTS 100 tablet 6  . potassium chloride SA (K-DUR) 20 MEQ tablet Take 1 tablet (20 mEq total) by mouth 2 (two) times daily. (Patient taking differently: Take 20 mEq by mouth daily.) 180 tablet 3  . traZODone (DESYREL) 50 MG tablet TAKE 1/2 TO 1 TABLET BY  MOUTH AT BEDTIME AS NEEDED  FOR SLEEP 90 tablet 1   No current facility-administered medications on file prior to visit.    There were no vitals taken for this visit.      Objective:   Physical Exam Vitals and nursing note reviewed.  Constitutional:      General: She is not in acute distress.    Appearance: Normal appearance. She is well-developed. She is not ill-appearing.  HENT:     Head: Normocephalic and atraumatic.     Right Ear: Tympanic membrane, ear canal and external ear normal. There is no impacted cerumen.     Left Ear: Tympanic membrane, ear canal and external ear normal. There is no impacted cerumen.     Nose: Nose normal. No congestion or rhinorrhea.     Mouth/Throat:     Mouth: Mucous membranes are moist.     Pharynx: Oropharynx is clear. No oropharyngeal exudate or posterior oropharyngeal erythema.  Eyes:     General:        Right eye: No discharge.        Left eye: No discharge.     Extraocular Movements: Extraocular movements intact.     Conjunctiva/sclera: Conjunctivae normal.     Pupils: Pupils are equal, round, and reactive to light.  Neck:  Thyroid: No thyromegaly.     Vascular: No carotid bruit.     Trachea: No tracheal deviation.   Cardiovascular:     Rate and Rhythm: Normal rate and regular rhythm.     Pulses: Normal pulses.     Heart sounds: Normal heart sounds. No murmur heard. No friction rub. No gallop.   Pulmonary:     Effort: Pulmonary effort is normal. No respiratory distress.     Breath sounds: Normal breath sounds. No stridor. No wheezing, rhonchi or rales.  Chest:     Chest wall: No tenderness.  Abdominal:     General: Abdomen is flat. Bowel sounds are normal. There is no distension.     Palpations: Abdomen is soft. There is no mass.     Tenderness: There is no abdominal tenderness. There is no right CVA tenderness, left CVA tenderness, guarding or rebound.     Hernia: No hernia is present.  Musculoskeletal:        General: No swelling, tenderness, deformity or signs of injury. Normal range of motion.     Cervical back: Normal range of motion and neck supple.     Right lower leg: No edema.     Left lower leg: No edema.  Lymphadenopathy:     Cervical: No cervical adenopathy.  Skin:    General: Skin is warm and dry.     Coloration: Skin is not jaundiced or pale.     Findings: No bruising, erythema, lesion or rash.  Neurological:     General: No focal deficit present.     Mental Status: She is alert and oriented to person, place, and time.     Cranial Nerves: No cranial nerve deficit.     Sensory: No sensory deficit.     Motor: No weakness.     Coordination: Coordination normal.     Gait: Gait normal.     Deep Tendon Reflexes: Reflexes normal.  Psychiatric:        Mood and Affect: Mood normal.        Behavior: Behavior normal.        Thought Content: Thought content normal.        Judgment: Judgment normal.       Assessment & Plan:  1. Diabetes mellitus without complication ([place]) - Consider increasing Jardiance to 25 mg or decreasing and adding Trulicity  - Vitamin J50; Future - CBC with Differential/Platelet; Future - Comprehensive metabolic panel; Future - Hemoglobin A1c; Future -  TSH; Future - Microalbumin/Creatinine Ratio, Urine; Future - Microalbumin/Creatinine Ratio, Urine - TSH - Hemoglobin A1c - Comprehensive metabolic panel - CBC with Differential/Platelet - Vitamin B12  2. Hypothyroidism, unspecified type - Consider adding synthroid  - Vitamin B12; Future - CBC with Differential/Platelet; Future - Comprehensive metabolic panel; Future - Hemoglobin A1c; Future - TSH; Future - TSH - Hemoglobin A1c - Comprehensive metabolic panel - CBC with Differential/Platelet - Vitamin B12  3. Mixed hyperlipidemia - Continue statin  - Vitamin B12; Future - CBC with Differential/Platelet; Future - Comprehensive metabolic panel; Future - Hemoglobin A1c; Future - TSH; Future - TSH - Hemoglobin A1c - Comprehensive metabolic panel - CBC with Differential/Platelet - Vitamin B12  4. Essential hypertension - Well controlled. No change in medications  - Vitamin B12; Future - CBC with Differential/Platelet; Future - Comprehensive metabolic panel; Future - Hemoglobin A1c; Future - TSH; Future - TSH - Hemoglobin A1c - Comprehensive metabolic panel - CBC with Differential/Platelet - Vitamin B12  5. Sleep disturbance - Continue  with Trazodone PRN   6. Atherosclerosis of native coronary artery of native heart without angina pectoris - continue with lipitor 80 mg  - Follow up with cardiology as directed - Vitamin B12; Future - CBC with Differential/Platelet; Future - Comprehensive metabolic panel; Future - Hemoglobin A1c; Future - TSH; Future - TSH - Hemoglobin A1c - Comprehensive metabolic panel - CBC with Differential/Platelet - Vitamin B12  7. Neuropathy - Will check B12  - Vitamin B12; Future - CBC with Differential/Platelet; Future - Comprehensive metabolic panel; Future - Hemoglobin A1c; Future - TSH; Future - TSH - Hemoglobin A1c - Comprehensive metabolic panel - CBC with Differential/Platelet - Vitamin B12  [name], NP

## 2020-12-22 ENCOUNTER — Telehealth: Payer: Self-pay | Admitting: Adult Health

## 2020-12-22 MED ORDER — EMPAGLIFLOZIN 25 MG PO TABS: 25.0000 mg | ORAL_TABLET | Freq: Every day | ORAL | 1 refills | Status: DC

## 2020-12-22 NOTE — Telephone Encounter (Signed)
Updated patient on her labs. A1c at 7.4 - will increase jardaince to 25 mg and follow up in 3 months

## 2020-12-24 DIAGNOSIS — M25511 Pain in right shoulder: Secondary | ICD-10-CM | POA: Diagnosis not present

## 2021-01-03 ENCOUNTER — Ambulatory Visit: Payer: Medicare Other

## 2021-01-11 ENCOUNTER — Other Ambulatory Visit: Payer: Self-pay | Admitting: Adult Health

## 2021-01-11 DIAGNOSIS — E039 Hypothyroidism, unspecified: Secondary | ICD-10-CM

## 2021-01-11 DIAGNOSIS — G4700 Insomnia, unspecified: Secondary | ICD-10-CM

## 2021-02-21 ENCOUNTER — Other Ambulatory Visit: Payer: Self-pay

## 2021-02-21 ENCOUNTER — Ambulatory Visit
Admission: RE | Admit: 2021-02-21 | Discharge: 2021-02-21 | Disposition: A | Discharge status: Home / Self Care | Payer: Medicare Other | Source: Ambulatory Visit | Attending: Adult Health | Admitting: Adult Health

## 2021-02-21 DIAGNOSIS — Z1231 Encounter for screening mammogram for malignant neoplasm of breast: Secondary | ICD-10-CM

## 2021-02-25 DIAGNOSIS — H524 Presbyopia: Secondary | ICD-10-CM | POA: Diagnosis not present

## 2021-02-25 DIAGNOSIS — Z961 Presence of intraocular lens: Secondary | ICD-10-CM | POA: Diagnosis not present

## 2021-02-25 DIAGNOSIS — H43812 Vitreous degeneration, left eye: Secondary | ICD-10-CM | POA: Diagnosis not present

## 2021-03-02 ENCOUNTER — Telehealth: Payer: Self-pay | Admitting: Adult Health

## 2021-03-02 NOTE — Telephone Encounter (Signed)
Left message for patient to call back and schedule Medicare Annual Wellness Visit (AWV) either virtually or in office.  Left both  my jabber number [phone] and office number    Last AWV 03/29/20  please schedule at anytime with [facility] or 2   This should be a 45 minute visit.

## 2021-03-30 ENCOUNTER — Ambulatory Visit: Payer: Medicare Other

## 2021-03-30 DIAGNOSIS — Z23 Encounter for immunization: Secondary | ICD-10-CM | POA: Diagnosis not present

## 2021-04-08 ENCOUNTER — Ambulatory Visit
Admission: EM | Admit: 2021-04-08 | Discharge: 2021-04-08 | Disposition: A | Discharge status: Home / Self Care | Payer: Medicare Other | Attending: Emergency Medicine | Admitting: Emergency Medicine

## 2021-04-08 ENCOUNTER — Other Ambulatory Visit: Payer: Self-pay

## 2021-04-08 DIAGNOSIS — J028 Acute pharyngitis due to other specified organisms: Secondary | ICD-10-CM | POA: Diagnosis not present

## 2021-04-08 DIAGNOSIS — J4521 Mild intermittent asthma with (acute) exacerbation: Secondary | ICD-10-CM | POA: Diagnosis not present

## 2021-04-08 DIAGNOSIS — B349 Viral infection, unspecified: Secondary | ICD-10-CM | POA: Diagnosis not present

## 2021-04-08 DIAGNOSIS — R112 Nausea with vomiting, unspecified: Secondary | ICD-10-CM | POA: Insufficient documentation

## 2021-04-08 DIAGNOSIS — R8290 Unspecified abnormal findings in urine: Secondary | ICD-10-CM | POA: Diagnosis not present

## 2021-04-08 DIAGNOSIS — R059 Cough, unspecified: Secondary | ICD-10-CM | POA: Diagnosis not present

## 2021-04-08 DIAGNOSIS — R197 Diarrhea, unspecified: Secondary | ICD-10-CM | POA: Insufficient documentation

## 2021-04-08 DIAGNOSIS — B9789 Other viral agents as the cause of diseases classified elsewhere: Secondary | ICD-10-CM | POA: Diagnosis not present

## 2021-04-08 DIAGNOSIS — R9431 Abnormal electrocardiogram [ECG] [EKG]: Secondary | ICD-10-CM | POA: Insufficient documentation

## 2021-04-08 LAB — POCT URINALYSIS DIP (MANUAL ENTRY)
Bilirubin, UA: NEGATIVE
Blood, UA: NEGATIVE
Glucose, UA: >=1000 mg/dL — AB
Leukocytes, UA: NEGATIVE
Nitrite, UA: NEGATIVE
Protein Ur, POC: NEGATIVE mg/dL
Spec Grav, UA: 1.02 (ref 1.010–1.025)
Urobilinogen, UA: 0.2 E.U./dL
pH, UA: 5 (ref 5.0–8.0)

## 2021-04-08 MED ORDER — DEXAMETHASONE 6 MG PO TABS: 6.0000 mg | ORAL_TABLET | Freq: Two times a day (BID) | ORAL | 0 refills | Status: AC

## 2021-04-08 MED ORDER — ALBUTEROL SULFATE HFA 108 (90 BASE) MCG/ACT IN AERS: 1.0000 | INHALATION_SPRAY | RESPIRATORY_TRACT | 1 refills | Status: DC | PRN

## 2021-04-08 MED ORDER — ARNUITY ELLIPTA 100 MCG/ACT IN AEPB: 1.0000 | INHALATION_SPRAY | Freq: Every day | RESPIRATORY_TRACT | 1 refills | Status: AC

## 2021-04-08 MED ORDER — AZITHROMYCIN 250 MG PO TABS: 250.0000 mg | ORAL_TABLET | Freq: Every day | ORAL | 0 refills | Status: DC

## 2021-04-08 NOTE — ED Triage Notes (Signed)
Pt reports sore throat, ear aches, cough, body aches, diarrhea, nausea for about 5 days.    Home interventions: Flovent,tylenol, motrin, mucus relief DM

## 2021-04-08 NOTE — ED Provider Notes (Signed)
[facility]    CSN: [id number] Arrival date & time: 04/08/21  2482      History   Chief Complaint Chief Complaint  Patient presents with   Sore Throat   Cough   Otalgia   Diarrhea    HPI Rebecca [name] is a 75 y.o. female.   Patient complains of 6 days of body ache, headache, sore throat, cough, subjective fever.  Patient states diarrhea began yesterday, she has had approximately 3 episodes of liquid stool without blood or mucus.  Patient states she has been a little bit nauseated as well, typically vomits when she has a bad coughing spell, reports a history of reactive airway disease.  States she had an old prescription for Flovent that was over a-year-old that she started using yesterday, is not sure how well it is working for her.  Patient states she is also tried Tylenol, Motrin and Mucinex DM.  Patient states she is not noticed any improvement with any of the medication she is tried.  Patient states she has been avoiding being around others, wearing her mask is much as possible, states she lives in an independent living community, has not been able to get an appointment with her doctor until next week.  Urine dip today revealed 1000+ sugar and moderate ketones, fingerstick blood glucose today was 134, patient states she has not had much of an appetite last few days, reports compliance with all medication.  Patient reports a history of type 2 diabetes, currently on Jardiance, states last A1c was about 3 months ago 1 was 7.9.  Patient reports history of 100% occlusion of her right coronary artery diagnosed 21 years ago, states 15 years later she had a stent placed as well.  Patient states she has noticed unusual fatigue since she began feeling sick 6 days ago, cannot definitively state whether or not this fatigue is similar or dissimilar to previous episodes of acute coronary syndrome.  Patient denies active chest pain, shortness of breath, sense of pending doom, diaphoresis  at this time.  Med list reviewed, patient currently does not take aspirin on a daily basis  The history is provided by the patient.  Past Medical History:  Diagnosis Date   Asthma    extrinsic   CAD (coronary artery disease)    Chest pain, atypical    Complication of anesthesia    Diabetes ([place])    Edema    Fatigue    Fatigue    GERD (gastroesophageal reflux disease)    Hyperlipidemia    Hypertension    familiar   Hypothyroidism    IBS (irritable bowel syndrome)    hx   Keratosis, seborrheic    Labyrinthitis    MI (myocardial infarction) ([place])    age 44   Osteoarthritis    PONV (postoperative nausea and vomiting)    S/P arthroscopy of right shoulder 03/01/2018   Skin cancer    Melanoma     Patient Active Problem List   Diagnosis Date Noted   OA (osteoarthritis) of hip 11/12/2019   Primary osteoarthritis of right hip 11/12/2019   Palpitations 08/28/2018   Osteoporosis 12/25/2017   Coronary artery disease    Dysuria 02/17/2017   Diarrhea 02/17/2017   Vitamin D deficiency 11/07/2016   Chronic diastolic CHF (congestive heart failure) ([place]) 11/18/2015   Diabetes mellitus without complication ([place]) 50/09/7046   Reactive airway disease with acute exacerbation 09/10/2014   Nonspecific (abnormal) findings on radiological and other examination of gastrointestinal tract  04/04/2012   Insomnia, idiopathic 10/04/2010   Hyperlipidemia 09/22/2009   Essential hypertension 09/22/2009   CAD, NATIVE VESSEL 09/22/2009   EDEMA 05/19/2009   Hypothyroidism 02/23/2009   EXTRINSIC ASTHMA, UNSPECIFIED 02/10/2009   KERATOSIS, SEBORRHEIC NEC 04/16/2007   SKIN CANCER, HX OF 02/14/2007   Depression 01/24/2007   MYOCARDIAL INFARCTION, HX OF 01/24/2007   GERD 01/24/2007   Osteoarthritis 01/24/2007   COLONIC POLYPS, HX OF 01/24/2007   IRRITABLE BOWEL SYNDROME, HX OF 01/24/2007    Past Surgical History:  Procedure Laterality Date   Breast Biopsy     dense breast tissue   BREAST  EXCISIONAL BIOPSY Left 2000   benign   CHOLECYSTECTOMY     EUS  04/04/2012   Procedure: UPPER ENDOSCOPIC ULTRASOUND (EUS) LINEAR;  Surgeon: Milus Banister, MD;  Location: [facility];  Service: Endoscopy;  Laterality: N/A;  radial linear   KNEE ARTHROPLASTY     total right  02-28-2010 Dr Gaynelle Arabian, left   KNEE ARTHROPLASTY Bilateral 09/12/2017   Hector Shade   LEFT HEART CATH AND CORONARY ANGIOGRAPHY N/A 04/20/2017   Procedure: LEFT HEART CATH AND CORONARY ANGIOGRAPHY;  Surgeon: Burnell Blanks, MD;  Location: [facility] [facility];  Service: Cardiovascular;  Laterality: N/A;   TOTAL HIP ARTHROPLASTY Right 11/12/2019   Procedure: TOTAL HIP ARTHROPLASTY ANTERIOR APPROACH;  Surgeon: Gaynelle Arabian, MD;  Location: [facility];  Service: Orthopedics;  Laterality: Right;  157min   TUBAL LIGATION      OB History   No obstetric history on file.      Home Medications    Prior to Admission medications   Medication Sig Start Date End Date Taking? Authorizing Provider  albuterol (VENTOLIN HFA) 108 (90 Base) MCG/ACT inhaler Inhale 1-2 puffs into the lungs every 4 (four) hours as needed for wheezing or shortness of breath. 04/08/21  Yes Lynden Oxford Scales, PA-C  azithromycin (ZITHROMAX) 250 MG tablet Take 1 tablet (250 mg total) by mouth daily. Take first 2 tablets together, then 1 every day until finished. 04/08/21  Yes Lynden Oxford Scales, PA-C  dexamethasone (DECADRON) 6 MG tablet Take 1 tablet (6 mg total) by mouth 2 (two) times daily with a meal for 5 days. 04/08/21 04/13/21 Yes Lynden Oxford Scales, PA-C  Fluticasone Furoate (ARNUITY ELLIPTA) 100 MCG/ACT AEPB Inhale 1 puff into the lungs daily. 04/08/21 05/08/21 Yes Lynden Oxford Scales, PA-C  Accu-Chek FastClix Lancets MISC USE   TO CHECK GLUCOSE TWICE DAILY 11/14/18   [name], NP  ACCU-CHEK GUIDE test strip Use to test blood glucose three daily 12/19/19   Nafziger, Tommi Rumps, NP  acetaminophen (TYLENOL) 325 MG tablet Take 650 mg by  mouth every 6 (six) hours as needed for moderate pain.    [provider]  ALPRAZolam Duanne Moron) 0.5 MG tablet TAKE 1/2 TO 1 TABLET BY  MOUTH AT BEDTIME AS NEEDED  FOR SLEEP 04/28/20   Nafziger, Tommi Rumps, NP  atorvastatin (LIPITOR) 80 MG tablet TAKE 1 TABLET BY MOUTH  DAILY 12/07/20   Nafziger, Tommi Rumps, NP  conjugated estrogens (PREMARIN) vaginal cream Place 0.5 Applicatorfuls vaginally every 7 (seven) days. 12/19/19   Nafziger, Tommi Rumps, NP  denosumab (PROLIA) 60 MG/ML SOSY injection Inject 60 mg into the skin every 6 (six) months.    [provider]  Diclofenac Sodium 1 % CREA Apply topically.    [provider]  diltiazem (CARDIZEM CD) 240 MG 24 hr capsule TAKE 1 CAPSULE(240 MG) BY MOUTH DAILY 11/26/19   [name], PA-C  empagliflozin (JARDIANCE) 25 MG TABS tablet Take 1 tablet (25 mg total) by mouth daily before breakfast. 12/22/20   Nafziger, Tommi Rumps, NP  furosemide (LASIX) 20 MG tablet TAKE 1 TABLET BY MOUTH  TWICE DAILY 12/07/20   Nafziger, Tommi Rumps, NP  levothyroxine (SYNTHROID) 50 MCG tablet TAKE 1 TABLET BY MOUTH  DAILY BEFORE BREAKFAST 01/11/21   Nafziger, Tommi Rumps, NP  Lido-Menthol-Methyl Sal-Camph (CBD KINGS) 4-3-9-1.2 % PTCH Apply topically.    [provider]  meloxicam (MOBIC) 15 MG tablet meloxicam 15 mg tablet  Take 1 tablet every day by oral route.    [provider]  nitroGLYCERIN (NITROSTAT) 0.4 MG SL tablet DISSOLVE 1 TABLET UNDER THE TONGUE EVERY 5 MINUTES AS&nbsp;&nbsp;NEEDED FOR CHEST PAIN. MAX&nbsp;&nbsp;3 DOSES IN 15 MINUTES. CALL 911 IF CHEST PAIN PERSISTS 12/19/19   Nafziger, Tommi Rumps, NP  potassium chloride SA (K-DUR) 20 MEQ tablet Take 1 tablet (20 mEq total) by mouth 2 (two) times daily. Patient taking differently: Take 20 mEq by mouth daily. 11/14/18   Nafziger, Tommi Rumps, NP  traZODone (DESYREL) 50 MG tablet TAKE 1/2 TO 1 TABLET BY  MOUTH AT BEDTIME AS NEEDED  FOR SLEEP 01/11/21   Nafziger, Tommi Rumps, NP    Family History Family History  Problem Relation Age of  Onset   Heart disease Mother    Hypertension Mother    Heart attack Mother 4       MULTIPLES OVER TIME   Melanoma Daughter 23       METASTATIC   Breast cancer Sister    Hypertension Father    Hypertension Sister     Social History Social History   Tobacco Use   Smoking status: Never   Smokeless tobacco: Never  Vaping Use   Vaping Use: Never used  Substance Use Topics   Alcohol use: No    Comment: rarely   Drug use: No     Allergies   Sulfa antibiotics and Sulfonamide derivatives   Review of Systems Review of Systems Per HPI  Physical Exam Triage Vital Signs ED Triage Vitals  Enc Vitals Group     BP 04/08/21 1059 130/79     Pulse Rate 04/08/21 1059 92     Resp 04/08/21 1059 18     Temp 04/08/21 1059 98.7 F (37.1 C)     Temp Source 04/08/21 1059 Oral     SpO2 04/08/21 1059 96 %     Weight --      Height --      Head Circumference --      Peak Flow --      Pain Score 04/08/21 1058 5     Pain Loc --      Pain Edu? --      Excl. in [place]? --    No data found.  Updated Vital Signs BP 130/79 (BP Location: Right Arm)   Pulse 92   Temp 98.7 F (37.1 C) (Oral)   Resp 18   SpO2 96%   Visual Acuity Right Eye Distance:   Left Eye Distance:   Bilateral Distance:    Right Eye Near:   Left Eye Near:    Bilateral Near:     Physical Exam   UC Treatments / Results  Labs (all labs ordered are listed, but only abnormal results are displayed) Labs Reviewed  POCT URINALYSIS DIP (MANUAL ENTRY) - Abnormal; Notable for the following components:      Result Value   Glucose, UA >=1,000 (*)    Ketones, POC UA  moderate (40) (*)    All other components within normal limits  COVID-19, FLU A+B NAA  URINE CULTURE  POCT FASTING CBG [facility] MANUAL ENTRY    EKG   Radiology No results found.  Procedures Procedures (including critical care time)  Medications Ordered in UC Medications - No data to display  Initial Impression / Assessment and Plan / UC Course   I have reviewed the triage vital signs and the nursing notes.  Pertinent labs & imaging results that were available during my care of the patient were reviewed by me and considered in my medical decision making (see chart for details).   Patient was tested for COVID and flu today, will be advised of results once they are available.  Urine dip was performed today due to patient complaining of nausea symptoms, it was normal.  I have renewed patient's inhaled corticosteroid and added above bronchodilator to her regimen.  I recommend that she take the bronchodilator 2 puffs 4 times daily and inhale Arnuity once daily in the morning.  I also recommend that she began a short course of dexamethasone, the steroid was chosen due to the possibility that she has COVID but has not been testing positive given that she lives in community with other elderly patients.  At this point, she has been sick long enough that antivirals would no longer be effective whether she has flu or COVID, patient advised.  I also recommend that she begin azithromycin both for pneumonia prophylaxis as well as for its anti-inflammatory properties.    Patient was advised of mild abnormalities on EKG.  I advised patient that obtaining a troponin level would be a good idea to rule out any cardiac etiology for her symptoms today.  Patient states she has a very good relationship with her cardiology PA and would like to show today's EKG to her to get her opinion first.  Patient was given return precautions, see below and discharge instructions for more details.  Patient demonstrated understanding and agreement with plan, all questions were addressed during visit. Final Clinical Impressions(s) / UC Diagnoses   Final diagnoses:  Mild intermittent reactive airway disease with acute exacerbation  Cough  Diarrhea, unspecified type  Sore throat (viral)  Non-intractable vomiting with nausea, unspecified vomiting type  Viral illness  Abnormal  urine finding  Nonspecific abnormal electrocardiogram (ECG) (EKG)     Discharge Instructions      To prevent your reactive airways disease worsening into an acute exacerbation, I recommend that you began 2 puffs of albuterol 4 times daily.  Your insurance formulary did not cover Flovent so I ordered you a prescription for another inhaled steroid called Arnuity, inhale 1 puff daily.  As we discussed, I believe an oral steroid is indicated as well, I have sent a prescription for dexamethasone 6 mg which she will take twice daily for 5 days.  After 3 days of dexamethasone, if your cough has completely resolved you do not need to finish the entire course.  Azithromycin has been prescribed for pneumonia prevention.  You will take 2 tablets on day 1 and then take 1 tablet daily thereafter until all tablets are complete.  This medication should be taken in its entirety even if you are feeling better.  Your COVID and flu testing results will be made available to you once they are received, this typically takes 12 to 24 hours.  In the meantime, I recommend you continue to "lay low" and wear your mask around others.  If  you continue to have diarrhea, please be sure that you are drinking an electrolyte solution such as Gatorade Zero sugar or a similar sugar-free electrolyte fluid.  You may certainly continue Tylenol as needed, ibuprofen may provide you a little more benefit of protecting her airway and relieve aches and pains, you can certainly alternate them if needed.  Please take urgent or emergency care should her symptoms worsen, should you have altered mental status, acute shortness of breath, increasing diarrhea, fever that does not respond to Tylenol, chest pain, purulent sputum.  Thank you for entrusting me with your care today.  II hope you feel better soon.     ED Prescriptions     Medication Sig Dispense Auth. Provider   albuterol (VENTOLIN HFA) 108 (90 Base) MCG/ACT inhaler Inhale 1-2  puffs into the lungs every 4 (four) hours as needed for wheezing or shortness of breath. 18 g Lynden Oxford Scales, PA-C   dexamethasone (DECADRON) 6 MG tablet Take 1 tablet (6 mg total) by mouth 2 (two) times daily with a meal for 5 days. 10 tablet Lynden Oxford Scales, PA-C   azithromycin (ZITHROMAX) 250 MG tablet Take 1 tablet (250 mg total) by mouth daily. Take first 2 tablets together, then 1 every day until finished. 6 tablet Lynden Oxford Scales, PA-C   Fluticasone Furoate (ARNUITY ELLIPTA) 100 MCG/ACT AEPB Inhale 1 puff into the lungs daily. 30 each Lynden Oxford Scales, PA-C      PDMP not reviewed this encounter.   Lynden Oxford Scales, PA-C 04/08/21 1248

## 2021-04-08 NOTE — Discharge Instructions (Addendum)
To prevent your reactive airways disease worsening into an acute exacerbation, I recommend that you began 2 puffs of albuterol 4 times daily.  Your insurance formulary did not cover Flovent so I ordered you a prescription for another inhaled steroid called Arnuity, inhale 1 puff daily.  As we discussed, I believe an oral steroid is indicated as well, I have sent a prescription for dexamethasone 6 mg which she will take twice daily for 5 days.  After 3 days of dexamethasone, if your cough has completely resolved you do not need to finish the entire course.  Azithromycin has been prescribed for pneumonia prevention.  You will take 2 tablets on day 1 and then take 1 tablet daily thereafter until all tablets are complete.  This medication should be taken in its entirety even if you are feeling better.  Your COVID and flu testing results will be made available to you once they are received, this typically takes 12 to 24 hours.  In the meantime, I recommend you continue to "lay low" and wear your mask around others.  If you continue to have diarrhea, please be sure that you are drinking an electrolyte solution such as Gatorade Zero sugar or a similar sugar-free electrolyte fluid.  You may certainly continue Tylenol as needed, ibuprofen may provide you a little more benefit of protecting her airway and relieve aches and pains, you can certainly alternate them if needed.  Please take urgent or emergency care should her symptoms worsen, should you have altered mental status, acute shortness of breath, increasing diarrhea, fever that does not respond to Tylenol, chest pain, purulent sputum.  Thank you for entrusting me with your care today.  II hope you feel better soon.

## 2021-04-10 LAB — URINE CULTURE: Culture: <10000 — AB

## 2021-04-10 LAB — COVID-19, FLU A+B NAA
Influenza A, NAA: NOT DETECTED
Influenza B, NAA: NOT DETECTED
SARS-CoV-2, NAA: NOT DETECTED

## 2021-04-12 ENCOUNTER — Telehealth: Payer: Self-pay

## 2021-04-12 DIAGNOSIS — M810 Age-related osteoporosis without current pathological fracture: Secondary | ICD-10-CM

## 2021-04-12 NOTE — Telephone Encounter (Signed)
Patient is due for Prolia on 04/26/21.  Pt's last CMP was done on 12/21/20 and her Calcium was 11.3, okay for injection?  Pt also needs a Vitamin D lab and to repeat her Dexa scan (last scan done 12/20/17.)  Okay to order these?

## 2021-04-13 ENCOUNTER — Telehealth (INDEPENDENT_AMBULATORY_CARE_PROVIDER_SITE_OTHER): Payer: Medicare Other | Admitting: Adult Health

## 2021-04-13 ENCOUNTER — Encounter: Payer: Self-pay | Admitting: Adult Health

## 2021-04-13 VITALS — Ht <= 58 in | Wt 138.0 lb

## 2021-04-13 DIAGNOSIS — J4521 Mild intermittent asthma with (acute) exacerbation: Secondary | ICD-10-CM | POA: Diagnosis not present

## 2021-04-13 DIAGNOSIS — I2510 Atherosclerotic heart disease of native coronary artery without angina pectoris: Secondary | ICD-10-CM

## 2021-04-13 DIAGNOSIS — R81 Glycosuria: Secondary | ICD-10-CM

## 2021-04-13 NOTE — Progress Notes (Signed)
Virtual Visit via Video Note  I connected with Rebecca [name] on 04/13/21 at 10:30 AM EDT by a video enabled telemedicine application and verified that I am speaking with the correct person using two identifiers.  Location patient: home Location provider:work or home office Persons participating in the virtual visit: patient, provider  I discussed the limitations of evaluation and management by telemedicine and the availability of in person appointments. The patient expressed understanding and agreed to proceed.   HPI: Is a 75 year old female who is being evaluated today after being seen at urgent care 6 days of body aches, headache, sore throat, cough, subjective fever as well as diarrhea.  She has a history of reactive airway disease and was using an old prescription of Flovent that was over a-year-old that she started using the day prior to [place] being seen at urgent care.  She had also tried Tylenol, Motrin, and Mucinex.  She did not notice any improvement with any of the medication she had tried.  He had 2 negative COVID tests at home  At urgent care her urine dip revealed 1000+ sugar and moderate ketones, fingerstick blood glucose was 134.  She had not had much of an appetite over the few days prior.  She was tested for COVID and flu at urgent care both were negative.  She was prescribed azithromycin to cover for pneumonia, methylprednisone and Arnuity inhaler.  Today she reports that she finished her azithromycin and finished her steroid taper yesterday.  While on the steroids her blood sugars were in the 200s to 300s.  She is starting to feel better and her breathing seems to be almost back to baseline.  She has not had to use her inhalers in the last 48 hours.  Continues to have a scratchy throat and mild ear pain but everything else has resolved.  She is starting to have uptick in her appetite and is able to keep food down without difficulty. He is concerned about having glucose and ketones in  her urine     ROS: See pertinent positives and negatives per HPI.  Past Medical History:  Diagnosis Date   Asthma    extrinsic   CAD (coronary artery disease)    Chest pain, atypical    Complication of anesthesia    Diabetes ([place])    Edema    Fatigue    Fatigue    GERD (gastroesophageal reflux disease)    Hyperlipidemia    Hypertension    familiar   Hypothyroidism    IBS (irritable bowel syndrome)    hx   Keratosis, seborrheic    Labyrinthitis    MI (myocardial infarction) ([place])    age 75   Osteoarthritis    PONV (postoperative nausea and vomiting)    S/P arthroscopy of right shoulder 03/01/2018   Skin cancer    Melanoma     Past Surgical History:  Procedure Laterality Date   Breast Biopsy     dense breast tissue   BREAST EXCISIONAL BIOPSY Left 2000   benign   CHOLECYSTECTOMY     EUS  04/04/2012   Procedure: UPPER ENDOSCOPIC ULTRASOUND (EUS) LINEAR;  Surgeon: Milus Banister, MD;  Location: [facility];  Service: Endoscopy;  Laterality: N/A;  radial linear   KNEE ARTHROPLASTY     total right  02-28-2010 Dr Gaynelle Arabian, left   KNEE ARTHROPLASTY Bilateral 09/12/2017   Hector Shade   LEFT HEART CATH AND CORONARY ANGIOGRAPHY N/A 04/20/2017   Procedure: LEFT HEART CATH AND  CORONARY ANGIOGRAPHY;  Surgeon: Burnell Blanks, MD;  Location: [facility];  Service: Cardiovascular;  Laterality: N/A;   TOTAL HIP ARTHROPLASTY Right 11/12/2019   Procedure: TOTAL HIP ARTHROPLASTY ANTERIOR APPROACH;  Surgeon: Gaynelle Arabian, MD;  Location: [facility];  Service: Orthopedics;  Laterality: Right;  173min   TUBAL LIGATION      Family History  Problem Relation Age of Onset   Heart disease Mother    Hypertension Mother    Heart attack Mother 21       MULTIPLES OVER TIME   Melanoma Daughter 6       METASTATIC   Breast cancer Sister    Hypertension Father    Hypertension Sister        Current Outpatient Medications:    Accu-Chek FastClix Lancets MISC, USE    TO CHECK GLUCOSE TWICE DAILY, Disp: 102 each, Rfl: 3   ACCU-CHEK GUIDE test strip, Use to test blood glucose three daily, Disp: 300 each, Rfl: 3   acetaminophen (TYLENOL) 325 MG tablet, Take 650 mg by mouth every 6 (six) hours as needed for moderate pain., Disp: , Rfl:    albuterol (VENTOLIN HFA) 108 (90 Base) MCG/ACT inhaler, Inhale 1-2 puffs into the lungs every 4 (four) hours as needed for wheezing or shortness of breath., Disp: 18 g, Rfl: 1   ALPRAZolam (XANAX) 0.5 MG tablet, TAKE 1/2 TO 1 TABLET BY  MOUTH AT BEDTIME AS NEEDED  FOR SLEEP, Disp: 30 tablet, Rfl: 2   atorvastatin (LIPITOR) 80 MG tablet, TAKE 1 TABLET BY MOUTH  DAILY, Disp: 90 tablet, Rfl: 3   conjugated estrogens (PREMARIN) vaginal cream, Place 0.5 Applicatorfuls vaginally every 7 (seven) days., Disp: 42.5 g, Rfl: 6   denosumab (PROLIA) 60 MG/ML SOSY injection, Inject 60 mg into the skin every 6 (six) months., Disp: , Rfl:    dexamethasone (DECADRON) 6 MG tablet, Take 1 tablet (6 mg total) by mouth 2 (two) times daily with a meal for 5 days., Disp: 10 tablet, Rfl: 0   Diclofenac Sodium 1 % CREA, Apply topically., Disp: , Rfl:    diltiazem (CARDIZEM CD) 240 MG 24 hr capsule, TAKE 1 CAPSULE(240 MG) BY MOUTH DAILY, Disp: 90 capsule, Rfl: 3   empagliflozin (JARDIANCE) 25 MG TABS tablet, Take 1 tablet (25 mg total) by mouth daily before breakfast., Disp: 90 tablet, Rfl: 1   Fluticasone Furoate (ARNUITY ELLIPTA) 100 MCG/ACT AEPB, Inhale 1 puff into the lungs daily., Disp: 30 each, Rfl: 1   furosemide (LASIX) 20 MG tablet, TAKE 1 TABLET BY MOUTH  TWICE DAILY, Disp: 180 tablet, Rfl: 3   levothyroxine (SYNTHROID) 50 MCG tablet, TAKE 1 TABLET BY MOUTH  DAILY BEFORE BREAKFAST, Disp: 90 tablet, Rfl: 3   Lido-Menthol-Methyl Sal-Camph (CBD KINGS) 4-3-9-1.2 % PTCH, Apply topically., Disp: , Rfl:    meloxicam (MOBIC) 15 MG tablet, meloxicam 15 mg tablet  Take 1 tablet every day by oral route., Disp: , Rfl:    nitroGLYCERIN (NITROSTAT) 0.4 MG SL  tablet, DISSOLVE 1 TABLET UNDER THE TONGUE EVERY 5 MINUTES AS&nbsp;&nbsp;NEEDED FOR CHEST PAIN. MAX&nbsp;&nbsp;3 DOSES IN 15 MINUTES. CALL 911 IF CHEST PAIN PERSISTS, Disp: 100 tablet, Rfl: 6   potassium chloride SA (K-DUR) 20 MEQ tablet, Take 1 tablet (20 mEq total) by mouth 2 (two) times daily. (Patient taking differently: Take 20 mEq by mouth daily.), Disp: 180 tablet, Rfl: 3   traZODone (DESYREL) 50 MG tablet, TAKE 1/2 TO 1 TABLET BY  MOUTH AT BEDTIME AS NEEDED  FOR SLEEP, Disp: 90 tablet, Rfl: 3  EXAM:  VITALS per patient if applicable:  GENERAL: alert, oriented, appears well and in no acute distress  HEENT: atraumatic, conjunttiva clear, no obvious abnormalities on inspection of external nose and ears  NECK: normal movements of the head and neck  LUNGS: on inspection no signs of respiratory distress, breathing rate appears normal, no obvious gross SOB, gasping or wheezing  CV: no obvious cyanosis  MS: moves all visible extremities without noticeable abnormality  PSYCH/NEURO: pleasant and cooperative, no obvious depression or anxiety, speech and thought processing grossly intact  ASSESSMENT AND PLAN:  Discussed the following assessment and plan:  1. Mild intermittent reactive airway disease with acute exacerbation -Symptoms seem to be nearly resolved.  Encouraged p.o. intake as tolerated and rest over the next few days.  If symptoms become worse or not clearly resolved in the next week then follow-up in the office  2. Glucose found in urine on examination -Assurance given, she is on Jardiance which will produce glucose in her urine.  Likely dehydrated which is why she had ketones in a higher degree of glucose in her urine.  This should resolve with p.o. hydration.       I discussed the assessment and treatment plan with the patient. The patient was provided an opportunity to ask questions and all were answered. The patient agreed with the plan and demonstrated an  understanding of the instructions.   The patient was advised to call back or seek an in-person evaluation if the symptoms worsen or if the condition fails to improve as anticipated.   [name], NP

## 2021-04-18 NOTE — Addendum Note (Signed)
Addended by: Nathanial Millman E on: 04/18/2021 12:15 PM   Modules accepted: Orders

## 2021-04-18 NOTE — Telephone Encounter (Signed)
I called and spoke with patient. We scheduled her lab appointment and Prolia injection for 04/26/21. Patient is aware a bone density has been ordered and she will keep an eye out for a phone call from scheduling.

## 2021-04-20 ENCOUNTER — Other Ambulatory Visit: Payer: Self-pay

## 2021-04-20 ENCOUNTER — Ambulatory Visit (INDEPENDENT_AMBULATORY_CARE_PROVIDER_SITE_OTHER): Payer: Medicare Other

## 2021-04-20 DIAGNOSIS — Z0000 Encounter for general adult medical examination without abnormal findings: Secondary | ICD-10-CM

## 2021-04-20 NOTE — Patient Instructions (Signed)
Rebecca [name] , Thank you for taking time to come for your Medicare Wellness Visit. I appreciate your ongoing commitment to your health goals. Please review the following plan we discussed and let me know if I can assist you in the future.   Screening recommendations/referrals: Colonoscopy: no longer required Mammogram: Done 02/21/21 repeat every year  Bone Density: Done 12/20/17 repeat every 2 years Recommended yearly ophthalmology/optometry visit for glaucoma screening and checkup Recommended yearly dental visit for hygiene and checkup  Vaccinations: Influenza vaccine: Due  Pneumococcal vaccine: Completed  Tdap vaccine: Up to date due after 05/15/21 Shingles vaccine: Shingrix discussed. 1st dose 04/06/21 Covid-19:Completed 1/21, 2/11, & 05/11/20  Advanced directives: Please bring a copy of your health care power of attorney and living will to the office at your convenience.  Conditions/risks identified: get back active and healthy  Next appointment: Follow up in one year for your annual wellness visit    Preventive Care 65 Years and Older, Female Preventive care refers to lifestyle choices and visits with your health care provider that can promote health and wellness. What does preventive care include? A yearly physical exam. This is also called an annual well check. Dental exams once or twice a year. Routine eye exams. Ask your health care provider how often you should have your eyes checked. Personal lifestyle choices, including: Daily care of your teeth and gums. Regular physical activity. Eating a healthy diet. Avoiding tobacco and drug use. Limiting alcohol use. Practicing safe sex. Taking low-dose aspirin every day. Taking vitamin and mineral supplements as recommended by your health care provider. What happens during an annual well check? The services and screenings done by your health care provider during your annual well check will depend on your age, overall health,  lifestyle risk factors, and family history of disease. Counseling  Your health care provider may ask you questions about your: Alcohol use. Tobacco use. Drug use. Emotional well-being. Home and relationship well-being. Sexual activity. Eating habits. History of falls. Memory and ability to understand (cognition). Work and work Statistician. Reproductive health. Screening  You may have the following tests or measurements: Height, weight, and BMI. Blood pressure. Lipid and cholesterol levels. These may be checked every 5 years, or more frequently if you are over 61 years old. Skin check. Lung cancer screening. You may have this screening every year starting at age 38 if you have a 30-pack-year history of smoking and currently smoke or have quit within the past 15 years. Fecal occult blood test (FOBT) of the stool. You may have this test every year starting at age 37. Flexible sigmoidoscopy or colonoscopy. You may have a sigmoidoscopy every 5 years or a colonoscopy every 10 years starting at age 62. Hepatitis C blood test. Hepatitis B blood test. Sexually transmitted disease (STD) testing. Diabetes screening. This is done by checking your blood sugar (glucose) after you have not eaten for a while (fasting). You may have this done every 1-3 years. Bone density scan. This is done to screen for osteoporosis. You may have this done starting at age 87. Mammogram. This may be done every 1-2 years. Talk to your health care provider about how often you should have regular mammograms. Talk with your health care provider about your test results, treatment options, and if necessary, the need for more tests. Vaccines  Your health care provider may recommend certain vaccines, such as: Influenza vaccine. This is recommended every year. Tetanus, diphtheria, and acellular pertussis (Tdap, Td) vaccine. You may need a Td  booster every 10 years. Zoster vaccine. You may need this after age  89. Pneumococcal 13-valent conjugate (PCV13) vaccine. One dose is recommended after age 75. Pneumococcal polysaccharide (PPSV23) vaccine. One dose is recommended after age 88. Talk to your health care provider about which screenings and vaccines you need and how often you need them. This information is not intended to replace advice given to you by your health care provider. Make sure you discuss any questions you have with your health care provider. Document Released: 07/30/2015 Document Revised: 03/22/2016 Document Reviewed: 05/04/2015 Elsevier Interactive Patient Education  2017 [place] Prevention in the Home Falls can cause injuries. They can happen to people of all ages. There are many things you can do to make your home safe and to help prevent falls. What can I do on the outside of my home? Regularly fix the edges of walkways and driveways and fix any cracks. Remove anything that might make you trip as you walk through a door, such as a raised step or threshold. Trim any bushes or trees on the path to your home. Use bright outdoor lighting. Clear any walking paths of anything that might make someone trip, such as rocks or tools. Regularly check to see if handrails are loose or broken. Make sure that both sides of any steps have handrails. Any raised decks and porches should have guardrails on the edges. Have any leaves, snow, or ice cleared regularly. Use sand or salt on walking paths during winter. Clean up any spills in your garage right away. This includes oil or grease spills. What can I do in the bathroom? Use night lights. Install grab bars by the toilet and in the tub and shower. Do not use towel bars as grab bars. Use non-skid mats or decals in the tub or shower. If you need to sit down in the shower, use a plastic, non-slip stool. Keep the floor dry. Clean up any water that spills on the floor as soon as it happens. Remove soap buildup in the tub or shower  regularly. Attach bath mats securely with double-sided non-slip rug tape. Do not have throw rugs and other things on the floor that can make you trip. What can I do in the bedroom? Use night lights. Make sure that you have a light by your bed that is easy to reach. Do not use any sheets or blankets that are too big for your bed. They should not hang down onto the floor. Have a firm chair that has side arms. You can use this for support while you get dressed. Do not have throw rugs and other things on the floor that can make you trip. What can I do in the kitchen? Clean up any spills right away. Avoid walking on wet floors. Keep items that you use a lot in easy-to-reach places. If you need to reach something above you, use a strong step stool that has a grab bar. Keep electrical cords out of the way. Do not use floor polish or wax that makes floors slippery. If you must use wax, use non-skid floor wax. Do not have throw rugs and other things on the floor that can make you trip. What can I do with my stairs? Do not leave any items on the stairs. Make sure that there are handrails on both sides of the stairs and use them. Fix handrails that are broken or loose. Make sure that handrails are as long as the stairways. Check any carpeting  to make sure that it is firmly attached to the stairs. Fix any carpet that is loose or worn. Avoid having throw rugs at the top or bottom of the stairs. If you do have throw rugs, attach them to the floor with carpet tape. Make sure that you have a light switch at the top of the stairs and the bottom of the stairs. If you do not have them, ask someone to add them for you. What else can I do to help prevent falls? Wear shoes that: Do not have high heels. Have rubber bottoms. Are comfortable and fit you well. Are closed at the toe. Do not wear sandals. If you use a stepladder: Make sure that it is fully opened. Do not climb a closed stepladder. Make sure that  both sides of the stepladder are locked into place. Ask someone to hold it for you, if possible. Clearly mark and make sure that you can see: Any grab bars or handrails. First and last steps. Where the edge of each step is. Use tools that help you move around (mobility aids) if they are needed. These include: Canes. Walkers. Scooters. Crutches. Turn on the lights when you go into a dark area. Replace any light bulbs as soon as they burn out. Set up your furniture so you have a clear path. Avoid moving your furniture around. If any of your floors are uneven, fix them. If there are any pets around you, be aware of where they are. Review your medicines with your doctor. Some medicines can make you feel dizzy. This can increase your chance of falling. Ask your doctor what other things that you can do to help prevent falls. This information is not intended to replace advice given to you by your health care provider. Make sure you discuss any questions you have with your health care provider. Document Released: 04/29/2009 Document Revised: 12/09/2015 Document Reviewed: 08/07/2014 Elsevier Interactive Patient Education  2017 [employer].

## 2021-04-20 NOTE — Progress Notes (Addendum)
Virtual Visit via Telephone Note  I connected with  Rebecca [name] on 04/20/21 at  2:30 PM EDT by telephone and verified that I am speaking with the correct person using two identifiers.  Location: Patient: Home Provider: Office Persons participating in the virtual visit: patient/Nurse Health Advisor   I discussed the limitations, risks, security and privacy concerns of performing an evaluation and management service by telephone and the availability of in person appointments. The patient expressed understanding and agreed to proceed.  Interactive audio and video telecommunications were attempted between this nurse and patient, however failed, due to patient having technical difficulties OR patient did not have access to video capability.  We continued and completed visit with audio only.  Some vital signs may be absent or patient reported.   Willette Brace, LPN   Subjective:   Rebecca [name] is a 75 y.o. female who presents for Medicare Annual (Subsequent) preventive examination.  Review of Systems     Cardiac Risk Factors include: advanced age (>53men, >91 women);hypertension;diabetes mellitus;dyslipidemia     Objective:    There were no vitals filed for this visit. There is no height or weight on file to calculate BMI.  Advanced Directives 04/20/2021 03/29/2020 11/12/2019 11/10/2019 11/03/2018 09/04/2017 04/20/2017  Does Patient Have a Medical Advance Directive? Yes Yes Yes Yes Yes Yes Yes  Type of Paramedic of [place];Living will [place];Living will [place];Living will - [place];Living will [place];Living will  Does patient want to make changes to medical advance directive? - No - Patient declined No - Patient declined No - Patient declined - - No - Patient declined  Copy of [place] in Chart? No - copy requested Yes -  validated most recent copy scanned in chart (See row information) Yes - validated most recent copy scanned in chart (See row information) Yes - validated most recent copy scanned in chart (See row information) - - No - copy requested    Current Medications (verified) Outpatient Encounter Medications as of 04/20/2021  Medication Sig   Accu-Chek FastClix Lancets MISC USE   TO CHECK GLUCOSE TWICE DAILY   ACCU-CHEK GUIDE test strip Use to test blood glucose three daily   acetaminophen (TYLENOL) 325 MG tablet Take 650 mg by mouth every 6 (six) hours as needed for moderate pain.   albuterol (VENTOLIN HFA) 108 (90 Base) MCG/ACT inhaler Inhale 1-2 puffs into the lungs every 4 (four) hours as needed for wheezing or shortness of breath.   atorvastatin (LIPITOR) 80 MG tablet TAKE 1 TABLET BY MOUTH  DAILY   conjugated estrogens (PREMARIN) vaginal cream Place 0.5 Applicatorfuls vaginally every 7 (seven) days.   denosumab (PROLIA) 60 MG/ML SOSY injection Inject 60 mg into the skin every 6 (six) months.   Diclofenac Sodium 1 % CREA Apply topically.   diltiazem (CARDIZEM CD) 240 MG 24 hr capsule TAKE 1 CAPSULE(240 MG) BY MOUTH DAILY   empagliflozin (JARDIANCE) 25 MG TABS tablet Take 1 tablet (25 mg total) by mouth daily before breakfast.   Fluticasone Furoate (ARNUITY ELLIPTA) 100 MCG/ACT AEPB Inhale 1 puff into the lungs daily.   furosemide (LASIX) 20 MG tablet TAKE 1 TABLET BY MOUTH  TWICE DAILY   levothyroxine (SYNTHROID) 50 MCG tablet TAKE 1 TABLET BY MOUTH  DAILY BEFORE BREAKFAST   potassium chloride SA (K-DUR) 20 MEQ tablet Take 1 tablet (20 mEq total) by mouth 2 (  two) times daily. (Patient taking differently: Take 20 mEq by mouth daily.)   traZODone (DESYREL) 50 MG tablet TAKE 1/2 TO 1 TABLET BY  MOUTH AT BEDTIME AS NEEDED  FOR SLEEP   ALPRAZolam (XANAX) 0.5 MG tablet TAKE 1/2 TO 1 TABLET BY  MOUTH AT BEDTIME AS NEEDED  FOR SLEEP (Patient not taking: Reported on 04/20/2021)   meloxicam (MOBIC) 15 MG  tablet meloxicam 15 mg tablet  Take 1 tablet every day by oral route. (Patient not taking: Reported on 04/20/2021)   nitroGLYCERIN (NITROSTAT) 0.4 MG SL tablet DISSOLVE 1 TABLET UNDER THE TONGUE EVERY 5 MINUTES AS&nbsp;&nbsp;NEEDED FOR CHEST PAIN. MAX&nbsp;&nbsp;3 DOSES IN 15 MINUTES. CALL 911 IF CHEST PAIN PERSISTS (Patient not taking: Reported on 04/20/2021)   [DISCONTINUED] Lido-Menthol-Methyl Sal-Camph (CBD KINGS) 4-3-9-1.2 % PTCH Apply topically. (Patient not taking: Reported on 04/20/2021)   No facility-administered encounter medications on file as of 04/20/2021.    Allergies (verified) Sulfa antibiotics and Sulfonamide derivatives   History: Past Medical History:  Diagnosis Date   Asthma    extrinsic   CAD (coronary artery disease)    Chest pain, atypical    Complication of anesthesia    Diabetes ([place])    Edema    Fatigue    Fatigue    GERD (gastroesophageal reflux disease)    Hyperlipidemia    Hypertension    familiar   Hypothyroidism    IBS (irritable bowel syndrome)    hx   Keratosis, seborrheic    Labyrinthitis    MI (myocardial infarction) ([place])    age 75   Osteoarthritis    PONV (postoperative nausea and vomiting)    S/P arthroscopy of right shoulder 03/01/2018   Skin cancer    Melanoma    Past Surgical History:  Procedure Laterality Date   Breast Biopsy     dense breast tissue   BREAST EXCISIONAL BIOPSY Left 2000   benign   CHOLECYSTECTOMY     EUS  04/04/2012   Procedure: UPPER ENDOSCOPIC ULTRASOUND (EUS) LINEAR;  Surgeon: Milus Banister, MD;  Location: [facility];  Service: Endoscopy;  Laterality: N/A;  radial linear   KNEE ARTHROPLASTY     total right  02-28-2010 Dr Gaynelle Arabian, left   KNEE ARTHROPLASTY Bilateral 09/12/2017   Hector Shade   LEFT HEART CATH AND CORONARY ANGIOGRAPHY N/A 04/20/2017   Procedure: LEFT HEART CATH AND CORONARY ANGIOGRAPHY;  Surgeon: Burnell Blanks, MD;  Location: [facility];  Service: Cardiovascular;   Laterality: N/A;   TOTAL HIP ARTHROPLASTY Right 11/12/2019   Procedure: TOTAL HIP ARTHROPLASTY ANTERIOR APPROACH;  Surgeon: Gaynelle Arabian, MD;  Location: [facility];  Service: Orthopedics;  Laterality: Right;  158min   TUBAL LIGATION     Family History  Problem Relation Age of Onset   Heart disease Mother    Hypertension Mother    Heart attack Mother 18       MULTIPLES OVER TIME   Melanoma Daughter 35       METASTATIC   Breast cancer Sister    Hypertension Father    Hypertension Sister    Social History   Socioeconomic History   Marital status: Married    Spouse name: Not on file   Number of children: 2   Years of education: Not on file   Highest education level: Not on file  Occupational History   Occupation: RN-retired    Employer: RETIRED  Tobacco Use   Smoking status: Never   Smokeless tobacco: Never  Vaping Use   Vaping Use: Never used  Substance and Sexual Activity   Alcohol use: No    Comment: rarely   Drug use: No   Sexual activity: Not on file  Other Topics Concern   Not on file  Social History Narrative   ** Merged History Encounter **       Social Determinants of Health   Financial Resource Strain: Low Risk    Difficulty of Paying Living Expenses: Not hard at all  Food Insecurity: No Food Insecurity   Worried About Charity fundraiser in the Last Year: Never true   Ran Out of Food in the Last Year: Never true  Transportation Needs: No Transportation Needs   Lack of Transportation (Medical): No   Lack of Transportation (Non-Medical): No  Physical Activity: Sufficiently Active   Days of Exercise per Week: 5 days   Minutes of Exercise per Session: 60 min  Stress: Stress Concern Present   Feeling of Stress : To some extent  Social Connections: Moderately Integrated   Frequency of Communication with Friends and Family: More than three times a week   Frequency of Social Gatherings with Friends and Family: More than three times a week   Attends Religious  Services: More than 4 times per year   Active Member of [employer] or Organizations: No   Attends Music therapist: Never   Marital Status: Married    Tobacco Counseling Counseling given: Not Answered   Clinical Intake:  Pre-visit preparation completed: Yes  Pain : No/denies pain     BMI - recorded: 28.85 Nutritional Status: BMI 25 -29 Overweight Nutritional Risks: None Diabetes: Yes CBG done?: Yes (172, was on steriod) CBG resulted in Enter/ Edit results?: No Did pt. bring in CBG monitor from home?: No  How often do you need to have someone help you when you read instructions, pamphlets, or other written materials from your doctor or pharmacy?: 1 - Never  Diabetic?Nutrition Risk Assessment:  Has the patient had any N/V/D within the last 2 months?  No  Does the patient have any non-healing wounds?  No  Has the patient had any unintentional weight loss or weight gain?  No   Diabetes:  Is the patient diabetic?  Yes  If diabetic, was a CBG obtained today?  Yes  Did the patient bring in their glucometer from home?  No  How often do you monitor your CBG's? Daily.   Financial Strains and Diabetes Management:  Are you having any financial strains with the device, your supplies or your medication? No .  Does the patient want to be seen by Chronic Care Management for management of their diabetes?  No  Would the patient like to be referred to a Nutritionist or for Diabetic Management?  No   Diabetic Exams:  Diabetic Eye 01/28/21 Diabetic Foot Exam: Completed 12/21/20   Interpreter Needed?: No  Information entered by :: Charlott Rakes, LPN   Activities of Daily Living In your present state of health, do you have any difficulty performing the following activities: 04/20/2021  Hearing? N  Vision? N  Difficulty concentrating or making decisions? N  Walking or climbing stairs? N  Dressing or bathing? N  Doing errands, shopping? N  Preparing Food and eating ? N   Using the Toilet? N  In the past six months, have you accidently leaked urine? Y  Comment wears a poise  Do you have problems with loss of bowel control? Y  Comment loose  stools while  sick  Managing your Medications? N  Managing your Finances? N  Housekeeping or managing your Housekeeping? N  Some recent data might be hidden    Patient Care Team: [name], NP as PCP - General (Family Medicine) Dorothy Spark, MD (Inactive) as PCP - Cardiology (Cardiology) Florinda Marker as Physician Assistant (Physician Assistant) Sydnee Cabal, MD as Consulting Physician (Orthopedic Surgery) Gaynelle Arabian, MD as Consulting Physician (Orthopedic Surgery) Murrell Converse as Physician Assistant (Cardiology)  Indicate any recent Medical Services you may have received from other than [facility] providers in the past year (date may be approximate).     Assessment:   This is a routine wellness examination for Rebecca [name].  Hearing/Vision screen Hearing Screening - Comments:: Pt denies hearing loss Vision Screening - Comments:: Pt follows up with dr Valetta Close for annual eye exams   Dietary issues and exercise activities discussed: Current Exercise Habits: Home exercise routine, Type of exercise: Other - see comments, Time (Minutes): 50, Frequency (Times/Week): 5, Weekly Exercise (Minutes/Week): 250   Goals Addressed             This Visit's Progress    Patient Stated       Continue to stay healthy       Depression Screen PHQ 2/9 Scores 04/20/2021 03/29/2020 12/19/2019 11/09/2017 11/07/2016 11/05/2015 06/22/2015  PHQ - 2 Score 1 0 0 0 0 0 0  PHQ- 9 Score - 0 - - - - -    Fall Risk Fall Risk  04/20/2021 03/29/2020 12/19/2019 02/12/2019 11/09/2017  Falls in the past year? 0 0 0 0 No  Comment - - - Emmi Telephone Survey: data to providers prior to load -  Number falls in past yr: 0 0 - - -  Injury with Fall? 0 0 - - -  Risk for fall due to : Impaired vision Medication side effect - - -   Follow up Falls prevention discussed - - - -    FALL RISK PREVENTION PERTAINING TO THE HOME:  Any stairs in or around the home? Yes  If so, are there any without handrails? No  Home free of loose throw rugs in walkways, pet beds, electrical cords, etc? Yes  Adequate lighting in your home to reduce risk of falls? Yes   ASSISTIVE DEVICES UTILIZED TO PREVENT FALLS:  Life alert? Yes  Use of a cane, walker or w/c? No  Grab bars in the bathroom? Yes  Shower chair or bench in shower? Yes  Elevated toilet seat or a handicapped toilet? Yes   TIMED UP AND GO:  Was the test performed? No .  Cognitive Function:     6CIT Screen 04/20/2021 03/29/2020  What Year? 0 points 0 points  What month? 0 points 0 points  What time? 0 points 0 points  Count back from 20 0 points 0 points  Months in reverse 0 points 0 points  Repeat phrase 0 points 0 points  Total Score 0 0    Immunizations Immunization History  Administered Date(s) Administered   Fluad Quad(high Dose 65+) 04/17/2019   Hep A / Hep B 11/22/2009, 12/20/2009   Influenza Split 03/30/2011, 04/12/2012   Influenza Whole 04/26/2007, 04/13/2009   Influenza, High Dose Seasonal PF 04/23/2015, 03/14/2017, 04/05/2018   Influenza, Seasonal, Injecte, Preservative Fre 04/29/2012   Influenza-Unspecified 04/18/2014, 04/16/2018   PFIZER(Purple Top)SARS-COV-2 Vaccination 08/07/2019, 08/28/2019, 05/11/2020, 03/30/2021   Pneumococcal Conjugate-13 06/30/2014   Pneumococcal Polysaccharide-23 07/18/2003, 02/23/2009, 11/07/2016   Td 07/17/2001  Tdap 05/16/2011   Zoster Recombinat (Shingrix) 04/06/2021   Zoster, Live 04/05/2010    TDAP status: Up to date  Flu Vaccine status: Due, Education has been provided regarding the importance of this vaccine. Advised may receive this vaccine at local pharmacy or Health Dept. Aware to provide a copy of the vaccination record if obtained from local pharmacy or Health Dept. Verbalized acceptance and  understanding.  Pneumococcal vaccine status: Up to date  Covid-19 vaccine status: Completed vaccines  Qualifies for Shingles Vaccine? Yes   Zostavax completed Yes   Shingrix Completed?: No.    Education has been provided regarding the importance of this vaccine. Patient has been advised to call insurance company to determine out of pocket expense if they have not yet received this vaccine. Advised may also receive vaccine at local pharmacy or Health Dept. Verbalized acceptance and understanding.  Screening Tests Health Maintenance  Topic Date Due   INFLUENZA VACCINE  02/14/2021   TETANUS/TDAP  05/15/2021   Zoster Vaccines- Shingrix (2 of 2) 06/01/2021   HEMOGLOBIN A1C  06/22/2021   COVID-19 Vaccine (5 - Booster for Pfizer series) 07/30/2021   FOOT EXAM  12/21/2021   URINE MICROALBUMIN  12/21/2021   OPHTHALMOLOGY EXAM  01/28/2022   DEXA SCAN  Completed   Hepatitis C Screening  Completed   HPV VACCINES  Aged Out    Health Maintenance  Health Maintenance Due  Topic Date Due   INFLUENZA VACCINE  02/14/2021    Colorectal cancer screening: No longer required.   Mammogram status: Completed 02/21/21. Repeat every year  Bone Density status: Completed 12/20/17. Results reflect: Bone density results: OSTEOPOROSIS. Repeat every 2 years.  Additional Screening:  Hepatitis C Screening: Completed 11/07/16  Vision Screening: Recommended annual ophthalmology exams for early detection of glaucoma and other disorders of the eye. Is the patient up to date with their annual eye exam?  Yes  Who is the provider or what is the name of the office in which the patient attends annual eye exams? Dr Valetta Close If pt is not established with a provider, would they like to be referred to a provider to establish care? No .   Dental Screening: Recommended annual dental exams for proper oral hygiene  Community Resource Referral / Chronic Care Management: CRR required this visit?  No   CCM required this visit?   No      Plan:     I have personally reviewed and noted the following in the patient's chart:   Medical and social history Use of alcohol, tobacco or illicit drugs  Current medications and supplements including opioid prescriptions.  Functional ability and status Nutritional status Physical activity Advanced directives List of other physicians Hospitalizations, surgeries, and ER visits in previous 12 months Vitals Screenings to include cognitive, depression, and falls Referrals and appointments  In addition, I have reviewed and discussed with patient certain preventive protocols, quality metrics, and best practice recommendations. A written personalized care plan for preventive services as well as general preventive health recommendations were provided to patient.     Willette Brace, LPN   32/08/252   Nurse Notes: None

## 2021-04-25 ENCOUNTER — Other Ambulatory Visit: Payer: Self-pay

## 2021-04-26 ENCOUNTER — Ambulatory Visit (INDEPENDENT_AMBULATORY_CARE_PROVIDER_SITE_OTHER): Payer: Medicare Other

## 2021-04-26 ENCOUNTER — Other Ambulatory Visit (INDEPENDENT_AMBULATORY_CARE_PROVIDER_SITE_OTHER): Payer: Medicare Other

## 2021-04-26 DIAGNOSIS — M810 Age-related osteoporosis without current pathological fracture: Secondary | ICD-10-CM | POA: Diagnosis not present

## 2021-04-26 LAB — VITAMIN D 25 HYDROXY (VIT D DEFICIENCY, FRACTURES): VITD: 54.02 ng/mL (ref 30.00–100.00)

## 2021-04-26 MED ORDER — DENOSUMAB 60 MG/ML ~~LOC~~ SOSY
60.0000 mg | PREFILLED_SYRINGE | Freq: Once | SUBCUTANEOUS | Status: AC
Start: 2021-04-26 — End: 2021-04-26
  Administered 2021-04-26: 60 mg via SUBCUTANEOUS

## 2021-04-26 NOTE — Progress Notes (Addendum)
Per orders from [name], NP, pt was given Prolia injection by Madaline Guthrie, pt tolerated well.

## 2021-05-02 ENCOUNTER — Ambulatory Visit (INDEPENDENT_AMBULATORY_CARE_PROVIDER_SITE_OTHER)
Admission: RE | Admit: 2021-05-02 | Discharge: 2021-05-02 | Disposition: A | Discharge status: Home / Self Care | Payer: Medicare Other | Source: Ambulatory Visit | Attending: Adult Health | Admitting: Adult Health

## 2021-05-02 ENCOUNTER — Other Ambulatory Visit: Payer: Self-pay

## 2021-05-02 DIAGNOSIS — M810 Age-related osteoporosis without current pathological fracture: Secondary | ICD-10-CM | POA: Diagnosis not present

## 2021-05-12 DIAGNOSIS — Z96641 Presence of right artificial hip joint: Secondary | ICD-10-CM | POA: Diagnosis not present

## 2021-05-12 DIAGNOSIS — M25551 Pain in right hip: Secondary | ICD-10-CM | POA: Diagnosis not present

## 2021-06-06 DIAGNOSIS — L578 Other skin changes due to chronic exposure to nonionizing radiation: Secondary | ICD-10-CM | POA: Diagnosis not present

## 2021-06-06 DIAGNOSIS — Z23 Encounter for immunization: Secondary | ICD-10-CM | POA: Diagnosis not present

## 2021-06-06 DIAGNOSIS — L821 Other seborrheic keratosis: Secondary | ICD-10-CM | POA: Diagnosis not present

## 2021-06-06 DIAGNOSIS — Z85828 Personal history of other malignant neoplasm of skin: Secondary | ICD-10-CM | POA: Diagnosis not present

## 2021-06-06 DIAGNOSIS — L309 Dermatitis, unspecified: Secondary | ICD-10-CM | POA: Diagnosis not present

## 2021-06-06 DIAGNOSIS — Z86018 Personal history of other benign neoplasm: Secondary | ICD-10-CM | POA: Diagnosis not present

## 2021-06-06 DIAGNOSIS — L814 Other melanin hyperpigmentation: Secondary | ICD-10-CM | POA: Diagnosis not present

## 2021-06-06 DIAGNOSIS — D225 Melanocytic nevi of trunk: Secondary | ICD-10-CM | POA: Diagnosis not present

## 2021-06-06 DIAGNOSIS — Z85820 Personal history of malignant melanoma of skin: Secondary | ICD-10-CM | POA: Diagnosis not present

## 2021-06-23 ENCOUNTER — Other Ambulatory Visit: Payer: Self-pay | Admitting: Orthopedic Surgery

## 2021-06-23 ENCOUNTER — Other Ambulatory Visit (HOSPITAL_COMMUNITY): Payer: Self-pay | Admitting: Orthopedic Surgery

## 2021-06-23 DIAGNOSIS — Z96641 Presence of right artificial hip joint: Secondary | ICD-10-CM | POA: Diagnosis not present

## 2021-07-04 ENCOUNTER — Encounter (HOSPITAL_COMMUNITY)
Admission: RE | Admit: 2021-07-04 | Discharge: 2021-07-04 | Disposition: A | Discharge status: Home / Self Care | Payer: Medicare Other | Source: Ambulatory Visit | Attending: Orthopedic Surgery | Admitting: Orthopedic Surgery

## 2021-07-04 ENCOUNTER — Other Ambulatory Visit: Payer: Self-pay

## 2021-07-04 DIAGNOSIS — M1612 Unilateral primary osteoarthritis, left hip: Secondary | ICD-10-CM | POA: Diagnosis not present

## 2021-07-04 DIAGNOSIS — M25551 Pain in right hip: Secondary | ICD-10-CM | POA: Diagnosis not present

## 2021-07-04 DIAGNOSIS — M549 Dorsalgia, unspecified: Secondary | ICD-10-CM | POA: Diagnosis not present

## 2021-07-04 DIAGNOSIS — Z96641 Presence of right artificial hip joint: Secondary | ICD-10-CM | POA: Diagnosis not present

## 2021-07-04 MED ORDER — TECHNETIUM TC 99M MEDRONATE IV KIT
22.0000 | PACK | Freq: Once | INTRAVENOUS | Status: AC | PRN
  Administered 2021-07-04: 11:00:00 22 via INTRAVENOUS

## 2021-09-28 ENCOUNTER — Encounter: Payer: Self-pay | Admitting: Adult Health

## 2021-09-28 ENCOUNTER — Ambulatory Visit (INDEPENDENT_AMBULATORY_CARE_PROVIDER_SITE_OTHER): Payer: Medicare Other | Admitting: Adult Health

## 2021-09-28 VITALS — BP 122/78 | HR 81 | Temp 97.8°F | Ht <= 58 in | Wt 136.0 lb

## 2021-09-28 DIAGNOSIS — G4700 Insomnia, unspecified: Secondary | ICD-10-CM

## 2021-09-28 DIAGNOSIS — F419 Anxiety disorder, unspecified: Secondary | ICD-10-CM

## 2021-09-28 DIAGNOSIS — F32A Depression, unspecified: Secondary | ICD-10-CM | POA: Diagnosis not present

## 2021-09-28 MED ORDER — CITALOPRAM HYDROBROMIDE 10 MG PO TABS: 10.0000 mg | ORAL_TABLET | Freq: Every day | ORAL | 0 refills | Status: DC

## 2021-09-28 NOTE — Progress Notes (Signed)
? ?Subjective:  ? ? Patient ID: Rebecca [name], female    DOB: Jun 03, 1946, 76 y.o.   MRN: [id number] ? ?HPI ?76 year old female who  has a past medical history of Asthma, CAD (coronary artery disease), Chest pain, atypical, Complication of anesthesia, Diabetes ([place]), Edema, Fatigue, Fatigue, GERD (gastroesophageal reflux disease), Hyperlipidemia, Hypertension, Hypothyroidism, IBS (irritable bowel syndrome), Keratosis, seborrheic, Labyrinthitis, MI (myocardial infarction) ([place]), Osteoarthritis, PONV (postoperative nausea and vomiting), S/P arthroscopy of right shoulder (03/01/2018), and Skin cancer. ? ?She presents to the office today for multiple concerns  ? ?Insomnia - over the last few months she has been experiencing more restless nights despite taking trazodone 25 mg QHS  ( 50 mg is too much for her). She wakes up after 3-4 hours of sleep. She feels as though the anxiety of her husband's memory loss is playing a big factor in her insomnia.  When she wakes up in the [place] it is hard for her to go to sleep due to racing thoughts and feeling as though she cannot "turn off her brain".  During the day she tries to stay busy so that she is not bothered by the anxiety.  She does not take naps during the day.  Over the last couple of nights she has had to take half a Xanax in order to fall asleep. ? ?Review of Systems ?See HPI  ? ?Past Medical History:  ?Diagnosis Date  ? Asthma   ? extrinsic  ? CAD (coronary artery disease)   ? Chest pain, atypical   ? Complication of anesthesia   ? Diabetes ([place])   ? Edema   ? Fatigue   ? Fatigue   ? GERD (gastroesophageal reflux disease)   ? Hyperlipidemia   ? Hypertension   ? familiar  ? Hypothyroidism   ? IBS (irritable bowel syndrome)   ? hx  ? Keratosis, seborrheic   ? Labyrinthitis   ? MI (myocardial infarction) ([place])   ? age 5  ? Osteoarthritis   ? PONV (postoperative nausea and vomiting)   ? S/P arthroscopy of right shoulder 03/01/2018  ? Skin cancer   ? Melanoma    ? ? ?Social History  ? ?Socioeconomic History  ? Marital status: Married  ?  Spouse name: Not on file  ? Number of children: 2  ? Years of education: Not on file  ? Highest education level: Not on file  ?Occupational History  ? Occupation: RN-retired  ?  Employer: RETIRED  ?Tobacco Use  ? Smoking status: Never  ? Smokeless tobacco: Never  ?Vaping Use  ? Vaping Use: Never used  ?Substance and Sexual Activity  ? Alcohol use: No  ?  Comment: rarely  ? Drug use: No  ? Sexual activity: Not on file  ?Other Topics Concern  ? Not on file  ?Social History Narrative  ? ** Merged History Encounter **  ?    ? ?Social Determinants of Health  ? ?Financial Resource Strain: Low Risk   ? Difficulty of Paying Living Expenses: Not hard at all  ?Food Insecurity: No Food Insecurity  ? Worried About Charity fundraiser in the Last Year: Never true  ? Ran Out of Food in the Last Year: Never true  ?Transportation Needs: No Transportation Needs  ? Lack of Transportation (Medical): No  ? Lack of Transportation (Non-Medical): No  ?Physical Activity: Sufficiently Active  ? Days of Exercise per Week: 5 days  ? Minutes of Exercise per Session: 60 min  ?  Stress: Stress Concern Present  ? Feeling of Stress : To some extent  ?Social Connections: Moderately Integrated  ? Frequency of Communication with Friends and Family: More than three times a week  ? Frequency of Social Gatherings with Friends and Family: More than three times a week  ? Attends Religious Services: More than 4 times per year  ? Active Member of Clubs or Organizations: No  ? Attends Archivist Meetings: Never  ? Marital Status: Married  ?Intimate Partner Violence: Not At Risk  ? Fear of Current or Ex-Partner: No  ? Emotionally Abused: No  ? Physically Abused: No  ? Sexually Abused: No  ? ? ?Past Surgical History:  ?Procedure Laterality Date  ? Breast Biopsy    ? dense breast tissue  ? BREAST EXCISIONAL BIOPSY Left 2000  ? benign  ? CHOLECYSTECTOMY    ? EUS  04/04/2012  ?  Procedure: UPPER ENDOSCOPIC ULTRASOUND (EUS) LINEAR;  Surgeon: Milus Banister, MD;  Location: [facility];  Service: Endoscopy;  Laterality: N/A;  radial linear  ? KNEE ARTHROPLASTY    ? total right  02-28-2010 Dr Gaynelle Arabian, left  ? KNEE ARTHROPLASTY Bilateral 09/12/2017  ? Pilar Plate Alusio  ? LEFT HEART CATH AND CORONARY ANGIOGRAPHY N/A 04/20/2017  ? Procedure: LEFT HEART CATH AND CORONARY ANGIOGRAPHY;  Surgeon: Burnell Blanks, MD;  Location: [facility];  Service: Cardiovascular;  Laterality: N/A;  ? TOTAL HIP ARTHROPLASTY Right 11/12/2019  ? Procedure: TOTAL HIP ARTHROPLASTY ANTERIOR APPROACH;  Surgeon: Gaynelle Arabian, MD;  Location: [facility];  Service: Orthopedics;  Laterality: Right;  158mn  ? TUBAL LIGATION    ? ? ?Family History  ?Problem Relation Age of Onset  ? Heart disease Mother   ? Hypertension Mother   ? Heart attack Mother 451 ?     MULTIPLES OVER TIME  ? Melanoma Daughter 258 ?     METASTATIC  ? Breast cancer Sister   ? Hypertension Father   ? Hypertension Sister   ? ? ?Allergies  ?Allergen Reactions  ? Sulfa Antibiotics   ?   SKathreen Cosiersyndrome  ? Sulfonamide Derivatives   ?  SKathreen Cosiersyndrome  ? ? ?Current Outpatient Medications on File Prior to Visit  ?Medication Sig Dispense Refill  ? Accu-Chek FastClix Lancets MISC USE   TO CHECK GLUCOSE TWICE DAILY 102 each 3  ? ACCU-CHEK GUIDE test strip Use to test blood glucose three daily 300 each 3  ? acetaminophen (TYLENOL) 325 MG tablet Take 650 mg by mouth every 6 (six) hours as needed for moderate pain.    ? albuterol (VENTOLIN HFA) 108 (90 Base) MCG/ACT inhaler Inhale 1-2 puffs into the lungs every 4 (four) hours as needed for wheezing or shortness of breath. 18 g 1  ? ALPRAZolam (XANAX) 0.5 MG tablet TAKE 1/2 TO 1 TABLET BY  MOUTH AT BEDTIME AS NEEDED  FOR SLEEP 30 tablet 2  ? atorvastatin (LIPITOR) 80 MG tablet TAKE 1 TABLET BY MOUTH  DAILY 90 tablet 3  ? conjugated estrogens (PREMARIN) vaginal cream Place 0.5  Applicatorfuls vaginally every 7 (seven) days. 42.5 g 6  ? denosumab (PROLIA) 60 MG/ML SOSY injection Inject 60 mg into the skin every 6 (six) months.    ? Diclofenac Sodium 1 % CREA Apply topically.    ? diltiazem (CARDIZEM CD) 240 MG 24 hr capsule TAKE 1 CAPSULE(240 MG) BY MOUTH DAILY 90 capsule 3  ? empagliflozin (JARDIANCE) 25 MG TABS tablet  Take 1 tablet (25 mg total) by mouth daily before breakfast. 90 tablet 1  ? furosemide (LASIX) 20 MG tablet TAKE 1 TABLET BY MOUTH  TWICE DAILY 180 tablet 3  ? levothyroxine (SYNTHROID) 50 MCG tablet TAKE 1 TABLET BY MOUTH  DAILY BEFORE BREAKFAST 90 tablet 3  ? nitroGLYCERIN (NITROSTAT) 0.4 MG SL tablet DISSOLVE 1 TABLET UNDER THE TONGUE EVERY 5 MINUTES AS&nbsp;&nbsp;NEEDED FOR CHEST PAIN. MAX&nbsp;&nbsp;3 DOSES IN 15 MINUTES. CALL 911 IF CHEST PAIN PERSISTS 100 tablet 6  ? potassium chloride SA (K-DUR) 20 MEQ tablet Take 1 tablet (20 mEq total) by mouth 2 (two) times daily. (Patient taking differently: Take 20 mEq by mouth daily.) 180 tablet 3  ? traZODone (DESYREL) 50 MG tablet TAKE 1/2 TO 1 TABLET BY  MOUTH AT BEDTIME AS NEEDED  FOR SLEEP 90 tablet 3  ? ?No current facility-administered medications on file prior to visit.  ? ? ?BP 122/78   Pulse 81   Temp 97.8 ?F (36.6 ?C) (Oral)   Ht '4\' 10"'$  (1.473 m)   Wt 136 lb (61.7 kg)   SpO2 97%   BMI 28.42 kg/m?  ? ? ?   ?Objective:  ? Physical Exam ?Vitals and nursing note reviewed.  ?Constitutional:   ?   Appearance: Normal appearance.  ?Skin: ?   General: Skin is warm and dry.  ?   Capillary Refill: Capillary refill takes less than 2 seconds.  ?Neurological:  ?   General: No focal deficit present.  ?   Mental Status: She is alert and oriented to person, place, and time.  ?Psychiatric:     ?   Mood and Affect: Mood normal.     ?   Behavior: Behavior normal.     ?   Thought Content: Thought content normal.     ?   Judgment: Judgment normal.  ? ?   ?Assessment & Plan:  ?1. Anxiety and depression ?-PHQ-9 score of 18 today.  We  will start her on Celexa 10 mg daily.  We will have her follow-up in 1 months ?- citalopram (CELEXA) 10 MG tablet; Take 1 tablet (10 mg total) by mouth daily.  Dispense: 90 tablet; Refill: 0 ? ?2. Insomnia, unspecified type ?-P

## 2021-09-28 NOTE — Patient Instructions (Addendum)
[facility]  ?Address: [address], [place], [place] [address] ?Phone: [phone] ? ?Please follow up in 1 month ? ? ?

## 2021-10-12 ENCOUNTER — Encounter: Payer: Self-pay | Admitting: Adult Health

## 2021-10-17 ENCOUNTER — Telehealth: Payer: Self-pay

## 2021-10-17 NOTE — Telephone Encounter (Signed)
I left patient a voicemail letting her know that she is due for her Prolia and she can receive it on 4/18 when she comes in to see [facility].  ? ?Estimated co-pay is $0. ?

## 2021-10-24 NOTE — Progress Notes (Deleted)
?Cardiology Office Note:   ? ?Date:  10/24/2021  ? ?ID:  Rebecca [name], DOB 1946-02-25, MRN [id number] ? ?PCP:  [name], NP ?  ?[facility] Providers ?Cardiologist:  Ena Dawley, MD ?Cardiology APP:  [name], PA-C {  ? ?Referring MD: [name], NP  ? ? ?History of Present Illness:   ? ?Rebecca [name] is a 76 y.o. female with a hx of of CAD status post PCI to the RCA in 2010,  hyperlipidemia, HTN, chronic diastolic CHF who presents to clinic for follow-up. Was previously followed by by Dr. [name]. ? ?Per review of the record, patient has known CAD with RCA PCI in 2010. Repeat cath in 04/2017 with mild disease, patent RCA stent. Recommended for medical management. TTE 10/2020 with normal EF 60-65%, G1DD, RV okay, trivial MR, RAP 48mHg.  ? ?Last saw [name] 09/2020 where she was doing well from a CV standpoint. Palpitations controlled on dilt. ? ?Past Medical History:  ?Diagnosis Date  ? Asthma   ? extrinsic  ? CAD (coronary artery disease)   ? Chest pain, atypical   ? Complication of anesthesia   ? [place] ([place]   ? Edema   ? Fatigue   ? Fatigue   ? GERD (gastroesophageal reflux disease)   ? Hyperlipidemia   ? Hypertension   ? familiar  ? Hypothyroidism   ? IBS (irritable bowel syndrome)   ? hx  ? Keratosis, seborrheic   ? Labyrinthitis   ? [place]   ? age [age] ? Osteoarthritis   ? PONV (postoperative nausea and vomiting)   ? S/P arthroscopy of right shoulder 03/01/2018  ? Skin cancer   ? Melanoma   ? ? ?Past Surgical History:  ?Procedure Laterality Date  ? Breast Biopsy    ? dense breast tissue  ? BREAST EXCISIONAL BIOPSY Left 2000  ? benign  ? CHOLECYSTECTOMY    ? EUS  04/04/2012  ? Procedure: UPPER ENDOSCOPIC ULTRASOUND (EUS) LINEAR;  Surgeon: DMilus Banister MD;  [facility];  Service: Endoscopy;  Laterality: N/A;  radial linear  ? KNEE ARTHROPLASTY    ? total right  02-28-2010 Dr FGaynelle Arabian left  ? KNEE ARTHROPLASTY Bilateral 09/12/2017   ? FPilar PlateAlusio  ? LEFT HEART CATH AND CORONARY ANGIOGRAPHY N/A 04/20/2017  ? Procedure: LEFT HEART CATH AND CORONARY ANGIOGRAPHY;  Surgeon: MBurnell Blanks MD;  [facility];  Service: Cardiovascular;  Laterality: N/A;  ? TOTAL HIP ARTHROPLASTY Right 11/12/2019  ? Procedure: TOTAL HIP ARTHROPLASTY ANTERIOR APPROACH;  Surgeon: AGaynelle Arabian MD;  [facility];  Service: Orthopedics;  Laterality: Right;  1015m  ? TUBAL LIGATION    ? ? ?Current Medications: ?No outpatient medications have been marked as taking for the 11/02/21 encounter (Appointment) with PeFreada BergeronMD.  ?  ? ?Allergies:   Sulfa antibiotics and Sulfonamide derivatives  ? ?Social History  ? ?Socioeconomic History  ? Marital status: Married  ?  Spouse name: Not on file  ? Number of children: 2  ? Years of education: Not on file  ? Highest education level: Not on file  ?Occupational History  ? Occupation: RN-retired  ?  Employer: RETIRED  ?Tobacco Use  ? Smoking status: Never  ? Smokeless tobacco: Never  ?Vaping Use  ? Vaping Use: Never used  ?Substance and Sexual Activity  ? Alcohol use: No  ?  Comment: rarely  ? Drug use: No  ?  Sexual activity: Not on file  ?Other Topics Concern  ? Not on file  ?Social History Narrative  ? ** Merged History Encounter **  ?    ? ?Social Determinants of Health  ? ?Financial Resource Strain: Low Risk   ? Difficulty of Paying Living Expenses: Not hard at all  ?Food Insecurity: No Food Insecurity  ? Worried About Charity fundraiser in the Last Year: Never true  ? Ran Out of Food in the Last Year: Never true  ?Transportation Needs: No Transportation Needs  ? Lack of Transportation (Medical): No  ? Lack of Transportation (Non-Medical): No  ?Physical Activity: Sufficiently Active  ? Days of Exercise per Week: 5 days  ? Minutes of Exercise per Session: 60 min  ?Stress: Stress Concern Present  ? Feeling of Stress : To some extent  ?Social Connections: Moderately Integrated  ? Frequency of  Communication with Friends and Family: More than three times a week  ? Frequency of Social Gatherings with Friends and Family: More than three times a week  ? Attends Religious Services: More than 4 times per year  ? Active Member of Clubs or Organizations: No  ? Attends Archivist Meetings: Never  ? Marital Status: Married  ?  ? ?Family History: ?The patient's ***family history includes Breast cancer in her sister; Heart attack (age of onset: 68) in her mother; Heart disease in her mother; Hypertension in her father, mother, and sister; Melanoma (age of onset: 82) in her daughter. ? ?ROS:   ?Please see the history of present illness.    ?*** All other systems reviewed and are negative. ? ?EKGs/Labs/Other Studies Reviewed:   ? ?The following studies were reviewed today: ?TTE 10/2020: ?IMPRESSIONS  ? ? ? 1. Left ventricular ejection fraction, by estimation, is 60 to 65%. Left  ?ventricular ejection fraction by 3D volume is 66 %. The left ventricle has  ?normal function. The left ventricle has no regional wall motion  ?abnormalities. Left ventricular diastolic  ? parameters are consistent with Grade I diastolic dysfunction (impaired  ?relaxation).  ? 2. Right ventricular systolic function is normal. The right ventricular  ?size is normal.  ? 3. The mitral valve is normal in structure. Trivial mitral valve  ?regurgitation. No evidence of mitral stenosis.  ? 4. The aortic valve is tricuspid. Aortic valve regurgitation is not  ?visualized. No aortic stenosis is present.  ? 5. The inferior vena cava is normal in size with greater than 50%  ?respiratory variability, suggesting right atrial pressure of 3 mmHg.  ? ?Cardiac Monitor 02/20: ?Sinus bradycardia to sinus tachycardia. ?Infrequent PACs, PVCs. Few very short SVT runs. ?  ?Sinus bradycardia to sinus tachycardia. Infrequent PACs, PVCs. Few very short SVT runs. ? ?[place] 04/2017: ?Prox RCA to Mid RCA lesion, 0 %stenosed. ?Prox RCA lesion, 40 %stenosed. ?Ost LAD  to Prox LAD lesion, 20 %stenosed. ?Prox LAD to Mid LAD lesion, 30 %stenosed. ?The left ventricular systolic function is normal. ?LV end diastolic pressure is normal. ?The left ventricular ejection fraction is greater than 65% by visual estimate. ?There is no mitral valve regurgitation. ?  ?1. Single vessel CAD with patent stent RCA. The RCA stent has mild to moderate restenosis. This does not appear to be flow limiting.  ?2. Mild disease LAD ?3. Normal LV systolic function ?  ?Recommendations: Continue medical management of CAD.  ?  ? ?EKG:  EKG is *** ordered today.  The ekg ordered today demonstrates *** ? ?Recent Labs: ?12/21/2020: ALT  36; BUN 36; Creatinine, Ser 0.98; Hemoglobin 15.4; Platelets 269.0; Potassium 4.0; Sodium 141; TSH 1.79  ?Recent Lipid Panel ?   ?Component Value Date/Time  ? CHOL 155 10/13/2020 0901  ? TRIG 92 10/13/2020 0901  ? HDL 60 10/13/2020 0901  ? CHOLHDL 2.6 10/13/2020 0901  ? CHOLHDL 3 11/05/2019 1025  ? VLDL 24.6 11/05/2019 1025  ? [place] 78 10/13/2020 0901  ? ? ? ?Risk Assessment/Calculations:   ?{Does this patient have ATRIAL FIBRILLATION?:[phone]} ? ?    ? ?Physical Exam:   ? ?VS:  There were no vitals taken for this visit.   ? ?Wt Readings from Last 3 Encounters:  ?09/28/21 136 lb (61.7 kg)  ?04/13/21 138 lb (62.6 kg)  ?12/21/20 138 lb (62.6 kg)  ?  ? ?GEN: *** Well nourished, well developed in no acute distress ?HEENT: Normal ?NECK: No JVD; No carotid bruits ?LYMPHATICS: No lymphadenopathy ?CARDIAC: ***RRR, no murmurs, rubs, gallops ?RESPIRATORY:  Clear to auscultation without rales, wheezing or rhonchi  ?ABDOMEN: Soft, non-tender, non-distended ?MUSCULOSKELETAL:  No edema; No deformity  ?SKIN: Warm and dry ?NEUROLOGIC:  Alert and oriented x 3 ?PSYCHIATRIC:  Normal affect  ? ?ASSESSMENT:   ? ?No diagnosis found. ?PLAN:   ? ?In order of problems listed above: ? ?#CAD s/p PCI to RCA: ?Underwent RCA PCI in 2010. Cath 04/2017 with nonobstructive CAD. TTE with normal BiV function. No  current anginal symptoms. ? ?#Chronic Diastolic HF: ?TTE 71/1657 with normal BiV function, LVEF 60-65%, G1DD, no significant valve disease. Currently compensated with NYHA class II symptoms. ?-Continue

## 2021-11-01 ENCOUNTER — Encounter: Payer: Self-pay | Admitting: Adult Health

## 2021-11-01 ENCOUNTER — Ambulatory Visit (INDEPENDENT_AMBULATORY_CARE_PROVIDER_SITE_OTHER): Payer: Medicare Other | Admitting: Adult Health

## 2021-11-01 VITALS — BP 110/68 | HR 76 | Temp 97.5°F | Ht <= 58 in | Wt 138.0 lb

## 2021-11-01 DIAGNOSIS — F419 Anxiety disorder, unspecified: Secondary | ICD-10-CM | POA: Diagnosis not present

## 2021-11-01 DIAGNOSIS — E119 Type 2 diabetes mellitus without complications: Secondary | ICD-10-CM | POA: Diagnosis not present

## 2021-11-01 DIAGNOSIS — F32A Depression, unspecified: Secondary | ICD-10-CM

## 2021-11-01 DIAGNOSIS — M810 Age-related osteoporosis without current pathological fracture: Secondary | ICD-10-CM | POA: Diagnosis not present

## 2021-11-01 DIAGNOSIS — G4700 Insomnia, unspecified: Secondary | ICD-10-CM

## 2021-11-01 LAB — POCT GLYCOSYLATED HEMOGLOBIN (HGB A1C): Hemoglobin A1C: 7.5 % — AB (ref 4.0–5.6)

## 2021-11-01 MED ORDER — DENOSUMAB 60 MG/ML ~~LOC~~ SOSY
60.0000 mg | PREFILLED_SYRINGE | Freq: Once | SUBCUTANEOUS | Status: AC
  Administered 2021-11-01: 60 mg via SUBCUTANEOUS

## 2021-11-01 MED ORDER — BUPROPION HCL ER (XL) 150 MG PO TB24: 150.0000 mg | ORAL_TABLET | Freq: Every day | ORAL | 0 refills | Status: DC

## 2021-11-01 NOTE — Progress Notes (Signed)
? ?Established Patient Office Visit ? ?Subjective   ?Patient ID: Rebecca [name], female    DOB: 11/03/1945  Age: 76 y.o. MRN: [id number] ? ?Chief Complaint  ?Patient presents with  ? Follow-up  ? ? ?HPI ? ?She presents to the office today for 1 month follow-up regarding anxiety/depression/insomnia.  When she was last seen she reported having more restful nights despite taking her prescribed trazodone at 25 mg nightly.  She was waking up after 3 to 4 hours of sleep.  She felt as though the anxiety of her husband's memory loss was playing a big factor in her insomnia.  When she was waking up in the middle the night it was hard for her to go back to sleep due to racing thoughts and the feeling of "could not turn off my brain".  During the day she was not bothered by anxiety as she was able to keep herself busy.  She was having to take half a Xanax at night in order to fall asleep. ? ?Her PHQ-9 score was 18 - one month ago.  She was started on Celexa 10 mg daily.  She has not been able to tolerate increases in trazodone in the past so we kept this dose the same at 25 mg nightly.  Thought her insomnia was possibly secondary to anxiety and depression. ? ?She reports today that Celexa 10 mg is making her too sedated during the day.  She is taking this medication at night and has had some improvement in her sleep pattern but still will wake up in the middle the night and not able to go back to sleep.  During the day she feels fatigued. ? ?Additionally, she is due for an A1c check.  Currently managed with Jardiance 25 mg daily.  She does tolerate this medication well.  Blood sugars have been slightly elevated into the 130s when she checks.  She denies hypoglycemic events.  Her last A1c in June 2022 was 7.4. ? ?She will also receive her Prolia injection while in the office today ? ? ? ?ROS ?See HPI  ? ?Past Medical History:  ?Diagnosis Date  ? Asthma   ? extrinsic  ? CAD (coronary artery disease)   ? Chest pain, atypical   ?  Complication of anesthesia   ? Diabetes ([place])   ? Edema   ? Fatigue   ? Fatigue   ? GERD (gastroesophageal reflux disease)   ? Hyperlipidemia   ? Hypertension   ? familiar  ? Hypothyroidism   ? IBS (irritable bowel syndrome)   ? hx  ? Keratosis, seborrheic   ? Labyrinthitis   ? MI (myocardial infarction) ([place])   ? age 57  ? Osteoarthritis   ? PONV (postoperative nausea and vomiting)   ? S/P arthroscopy of right shoulder 03/01/2018  ? Skin cancer   ? Melanoma   ? ? ?Social History  ? ?Socioeconomic History  ? Marital status: Married  ?  Spouse name: Not on file  ? Number of children: 2  ? Years of education: Not on file  ? Highest education level: Not on file  ?Occupational History  ? Occupation: RN-retired  ?  Employer: RETIRED  ?Tobacco Use  ? Smoking status: Never  ? Smokeless tobacco: Never  ?Vaping Use  ? Vaping Use: Never used  ?Substance and Sexual Activity  ? Alcohol use: No  ?  Comment: rarely  ? Drug use: No  ? Sexual activity: Not on file  ?Other Topics Concern  ?  Not on file  ?Social History Narrative  ? ** Merged History Encounter **  ?    ? ?Social Determinants of Health  ? ?Financial Resource Strain: Low Risk   ? Difficulty of Paying Living Expenses: Not hard at all  ?Food Insecurity: No Food Insecurity  ? Worried About Charity fundraiser in the Last Year: Never true  ? Ran Out of Food in the Last Year: Never true  ?Transportation Needs: No Transportation Needs  ? Lack of Transportation (Medical): No  ? Lack of Transportation (Non-Medical): No  ?Physical Activity: Sufficiently Active  ? Days of Exercise per Week: 5 days  ? Minutes of Exercise per Session: 60 min  ?Stress: Stress Concern Present  ? Feeling of Stress : To some extent  ?Social Connections: Moderately Integrated  ? Frequency of Communication with Friends and Family: More than three times a week  ? Frequency of Social Gatherings with Friends and Family: More than three times a week  ? Attends Religious Services: More than 4 times per year   ? Active Member of Clubs or Organizations: No  ? Attends Archivist Meetings: Never  ? Marital Status: Married  ?Intimate Partner Violence: Not At Risk  ? Fear of Current or Ex-Partner: No  ? Emotionally Abused: No  ? Physically Abused: No  ? Sexually Abused: No  ? ? ?Past Surgical History:  ?Procedure Laterality Date  ? Breast Biopsy    ? dense breast tissue  ? BREAST EXCISIONAL BIOPSY Left 2000  ? benign  ? CHOLECYSTECTOMY    ? EUS  04/04/2012  ? Procedure: UPPER ENDOSCOPIC ULTRASOUND (EUS) LINEAR;  Surgeon: Milus Banister, MD;  Location: [facility];  Service: Endoscopy;  Laterality: N/A;  radial linear  ? KNEE ARTHROPLASTY    ? total right  02-28-2010 Dr Gaynelle Arabian, left  ? KNEE ARTHROPLASTY Bilateral 09/12/2017  ? Pilar Plate Alusio  ? LEFT HEART CATH AND CORONARY ANGIOGRAPHY N/A 04/20/2017  ? Procedure: LEFT HEART CATH AND CORONARY ANGIOGRAPHY;  Surgeon: Burnell Blanks, MD;  Location: [facility];  Service: Cardiovascular;  Laterality: N/A;  ? TOTAL HIP ARTHROPLASTY Right 11/12/2019  ? Procedure: TOTAL HIP ARTHROPLASTY ANTERIOR APPROACH;  Surgeon: Gaynelle Arabian, MD;  Location: [facility];  Service: Orthopedics;  Laterality: Right;  119mn  ? TUBAL LIGATION    ? ? ?Family History  ?Problem Relation Age of Onset  ? Heart disease Mother   ? Hypertension Mother   ? Heart attack Mother 442 ?     MULTIPLES OVER TIME  ? Melanoma Daughter 250 ?     METASTATIC  ? Breast cancer Sister   ? Hypertension Father   ? Hypertension Sister   ? ? ?Allergies  ?Allergen Reactions  ? Sulfa Antibiotics   ?   SKathreen Cosiersyndrome  ? Sulfonamide Derivatives   ?  SKathreen Cosiersyndrome  ? ? ?Current Outpatient Medications on File Prior to Visit  ?Medication Sig Dispense Refill  ? Accu-Chek FastClix Lancets MISC USE   TO CHECK GLUCOSE TWICE DAILY 102 each 3  ? ACCU-CHEK GUIDE test strip Use to test blood glucose three daily 300 each 3  ? acetaminophen (TYLENOL) 325 MG tablet Take 650 mg by mouth every 6 (six)  hours as needed for moderate pain.    ? ALPRAZolam (XANAX) 0.5 MG tablet TAKE 1/2 TO 1 TABLET BY  MOUTH AT BEDTIME AS NEEDED  FOR SLEEP 30 tablet 2  ? atorvastatin (LIPITOR) 80 MG  tablet TAKE 1 TABLET BY MOUTH  DAILY 90 tablet 3  ? conjugated estrogens (PREMARIN) vaginal cream Place 0.5 Applicatorfuls vaginally every 7 (seven) days. 42.5 g 6  ? denosumab (PROLIA) 60 MG/ML SOSY injection Inject 60 mg into the skin every 6 (six) months.    ? Diclofenac Sodium 1 % CREA Apply topically.    ? diltiazem (CARDIZEM CD) 240 MG 24 hr capsule TAKE 1 CAPSULE(240 MG) BY MOUTH DAILY 90 capsule 3  ? empagliflozin (JARDIANCE) 25 MG TABS tablet Take 1 tablet (25 mg total) by mouth daily before breakfast. 90 tablet 1  ? furosemide (LASIX) 20 MG tablet TAKE 1 TABLET BY MOUTH  TWICE DAILY 180 tablet 3  ? levothyroxine (SYNTHROID) 50 MCG tablet TAKE 1 TABLET BY MOUTH  DAILY BEFORE BREAKFAST 90 tablet 3  ? nitroGLYCERIN (NITROSTAT) 0.4 MG SL tablet DISSOLVE 1 TABLET UNDER THE TONGUE EVERY 5 MINUTES AS&nbsp;&nbsp;NEEDED FOR CHEST PAIN. MAX&nbsp;&nbsp;3 DOSES IN 15 MINUTES. CALL 911 IF CHEST PAIN PERSISTS 100 tablet 6  ? potassium chloride SA (K-DUR) 20 MEQ tablet Take 1 tablet (20 mEq total) by mouth 2 (two) times daily. (Patient taking differently: Take 20 mEq by mouth daily.) 180 tablet 3  ? traZODone (DESYREL) 50 MG tablet TAKE 1/2 TO 1 TABLET BY  MOUTH AT BEDTIME AS NEEDED  FOR SLEEP 90 tablet 3  ? ?No current facility-administered medications on file prior to visit.  ? ? ?BP 110/68   Pulse 76   Temp (!) 97.5 ?F (36.4 ?C) (Oral)   Ht '4\' 10"'$  (1.473 m)   Wt 138 lb (62.6 kg)   SpO2 97%   BMI 28.84 kg/m?  ? ? ?  ?Objective:  ?  ? ?BP 110/68   Pulse 76   Temp (!) 97.5 ?F (36.4 ?C) (Oral)   Ht '4\' 10"'$  (1.473 m)   Wt 138 lb (62.6 kg)   SpO2 97%   BMI 28.84 kg/m?  ? ? ?Physical Exam ? ? ?Results for orders placed or performed in visit on 11/01/21  ?POC HgB A1c  ?Result Value Ref Range  ? Hemoglobin A1C 7.5 (A) 4.0 - 5.6 %  ? HbA1c  POC (<> result, manual entry)    ? HbA1c, POC (prediabetic range)    ? HbA1c, POC (controlled diabetic range)    ? ? ? ? ?The ASCVD Risk score (Arnett DK, et al., 2019) failed to calculate for the following reasons

## 2021-11-01 NOTE — Progress Notes (Incomplete)
?Cardiology Office Note:   ? ?Date:  11/01/2021  ? ?ID:  LASHA ECHEVERRIA, DOB 1946/01/19, MRN [id number] ? ?PCP:  [name], NP ?  ?[facility] Providers ?Cardiologist:  Ena Dawley, MD ?Cardiology APP:  [name], PA-C  ?{ ?Click to update primary MD,subspecialty MD or APP then REFRESH:1}   ? ?Referring MD: [name], NP  ? ?No chief complaint on file. ?*** ? ?History of Present Illness:   ? ?Rebecca [name] is a 76 y.o. female with a hx of CAD, hypertension, hyperlipidemia, MI (age 77), and diabetes here for follow-up. ? ?She last saw Ermalinda Barrios, PA-C 10/13/20 and was doing well from a CV standpoint.  ? ?Today, *** ? ?The patient denies chest pain, chest pressure, dyspnea at rest or with exertion, palpitations, PND, orthopnea, or leg swelling. Denies cough, fever, chills. Denies nausea, vomiting. Denies syncope or presyncope. Denies dizziness or lightheadedness. Denies snoring. ? ?Past Medical History:  ?Diagnosis Date  ? Asthma   ? extrinsic  ? CAD (coronary artery disease)   ? Chest pain, atypical   ? Complication of anesthesia   ? Diabetes ([place])   ? Edema   ? Fatigue   ? Fatigue   ? GERD (gastroesophageal reflux disease)   ? Hyperlipidemia   ? Hypertension   ? familiar  ? Hypothyroidism   ? IBS (irritable bowel syndrome)   ? hx  ? Keratosis, seborrheic   ? Labyrinthitis   ? MI (myocardial infarction) ([place])   ? age 36  ? Osteoarthritis   ? PONV (postoperative nausea and vomiting)   ? S/P arthroscopy of right shoulder 03/01/2018  ? Skin cancer   ? Melanoma   ? ? ?Past Surgical History:  ?Procedure Laterality Date  ? Breast Biopsy    ? dense breast tissue  ? BREAST EXCISIONAL BIOPSY Left 2000  ? benign  ? CHOLECYSTECTOMY    ? EUS  04/04/2012  ? Procedure: UPPER ENDOSCOPIC ULTRASOUND (EUS) LINEAR;  Surgeon: Milus Banister, MD;  Location: [facility];  Service: Endoscopy;  Laterality: N/A;  radial linear  ? KNEE ARTHROPLASTY    ? total right  02-28-2010 Dr Gaynelle Arabian, left  ? KNEE  ARTHROPLASTY Bilateral 09/12/2017  ? Pilar Plate Alusio  ? LEFT HEART CATH AND CORONARY ANGIOGRAPHY N/A 04/20/2017  ? Procedure: LEFT HEART CATH AND CORONARY ANGIOGRAPHY;  Surgeon: Burnell Blanks, MD;  Location: [facility];  Service: Cardiovascular;  Laterality: N/A;  ? TOTAL HIP ARTHROPLASTY Right 11/12/2019  ? Procedure: TOTAL HIP ARTHROPLASTY ANTERIOR APPROACH;  Surgeon: Gaynelle Arabian, MD;  Location: [facility];  Service: Orthopedics;  Laterality: Right;  136mn  ? TUBAL LIGATION    ? ? ?Current Medications: ?No outpatient medications have been marked as taking for the 11/02/21 encounter (Appointment) with PFreada Bergeron MD.  ?  ? ?Allergies:   Sulfa antibiotics and Sulfonamide derivatives  ? ?Social History  ? ?Socioeconomic History  ? Marital status: Married  ?  Spouse name: Not on file  ? Number of children: 2  ? Years of education: Not on file  ? Highest education level: Not on file  ?Occupational History  ? Occupation: RN-retired  ?  Employer: RETIRED  ?Tobacco Use  ? Smoking status: Never  ? Smokeless tobacco: Never  ?Vaping Use  ? Vaping Use: Never used  ?Substance and Sexual Activity  ? Alcohol use: No  ?  Comment: rarely  ? Drug use: No  ? Sexual activity: Not on file  ?  Other Topics Concern  ? Not on file  ?Social History Narrative  ? ** Merged History Encounter **  ?    ? ?Social Determinants of Health  ? ?Financial Resource Strain: Low Risk   ? Difficulty of Paying Living Expenses: Not hard at all  ?Food Insecurity: No Food Insecurity  ? Worried About Charity fundraiser in the Last Year: Never true  ? Ran Out of Food in the Last Year: Never true  ?Transportation Needs: No Transportation Needs  ? Lack of Transportation (Medical): No  ? Lack of Transportation (Non-Medical): No  ?Physical Activity: Sufficiently Active  ? Days of Exercise per Week: 5 days  ? Minutes of Exercise per Session: 60 min  ?Stress: Stress Concern Present  ? Feeling of Stress : To some extent  ?Social Connections:  Moderately Integrated  ? Frequency of Communication with Friends and Family: More than three times a week  ? Frequency of Social Gatherings with Friends and Family: More than three times a week  ? Attends Religious Services: More than 4 times per year  ? Active Member of Clubs or Organizations: No  ? Attends Archivist Meetings: Never  ? Marital Status: Married  ?  ? ?Family History: ?The patient's family history includes Breast cancer in her sister; Heart attack (age of onset: 82) in her mother; Heart disease in her mother; Hypertension in her father, mother, and sister; Melanoma (age of onset: 21) in her daughter. ? ?ROS:   ?Please see the history of present illness.    ?Review of Systems  ?Constitutional:  Negative for malaise/fatigue and weight loss.  ?HENT:  Negative for congestion and sore throat.   ?Eyes:  Negative for blurred vision.  ?Respiratory:  Negative for cough and shortness of breath.   ?Cardiovascular:  Negative for chest pain, palpitations, orthopnea, claudication, leg swelling and PND.  ?Gastrointestinal:  Negative for heartburn and nausea.  ?Genitourinary:  Negative for dysuria and urgency.  ?Musculoskeletal:  Negative for joint pain and myalgias.  ?Skin:  Negative for rash.  ?Neurological:  Negative for dizziness and headaches.  ?Endo/Heme/Allergies:  Does not bruise/bleed easily.  ?Psychiatric/Behavioral:  The patient is not nervous/anxious and does not have insomnia.   ?All other systems reviewed and are negative. ? ?EKGs/Labs/Other Studies Reviewed:   ? ?The following studies were reviewed today: ?Echo 11/12/20 ? 1. Left ventricular ejection fraction, by estimation, is 60 to 65%. Left  ?ventricular ejection fraction by 3D volume is 66 %. The left ventricle has  ?normal function. The left ventricle has no regional wall motion  ?abnormalities. Left ventricular diastolic  ? parameters are consistent with Grade I diastolic dysfunction (impaired  ?relaxation).  ? 2. Right ventricular  systolic function is normal. The right ventricular  ?size is normal.  ? 3. The mitral valve is normal in structure. Trivial mitral valve  ?regurgitation. No evidence of mitral stenosis.  ? 4. The aortic valve is tricuspid. Aortic valve regurgitation is not  ?visualized. No aortic stenosis is present.  ? 5. The inferior vena cava is normal in size with greater than 50%  ?respiratory variability, suggesting right atrial pressure of 3 mmHg.  ? ?Monitor 09/04/18 ?Sinus bradycardia to sinus tachycardia. ?Infrequent PACs, PVCs. Few very short SVT runs. ?  ?Sinus bradycardia to sinus tachycardia. Infrequent PACs, PVCs. Few very short SVT runs. ? ?Echo 09/04/18 ? 1. The left ventricle has normal systolic function, with an ejection  ?fraction of 55-60%. The cavity size was normal. There is mildly  increased  ?left ventricular wall thickness. Left ventricular diastolic Doppler  ?parameters are consistent with  ?pseudonormalization No evidence of left ventricular regional wall motion  ?abnormalities.  ? 2. The right ventricle has normal systolic function. The cavity was  ?normal. There is no increase in right ventricular wall thickness.  ? 3. The mitral valve is normal in structure. No evidence of mitral valve  ?stenosis. Trivial regurgitation.  ? 4. The tricuspid valve is normal in structure.  ? 5. The aortic valve is tricuspid no stenosis of the aortic valve.  ? 6. The pulmonic valve was normal in structure.  ? 7. The aortic root and ascending aorta are normal in size and structure.  ? 8. IVC normal in size, PA systolic pressure 26 mmHg.  ? ?L Heart Cath 04/20/17 ?Prox RCA to Mid RCA lesion, 0 %stenosed. ?Prox RCA lesion, 40 %stenosed. ?Ost LAD to Prox LAD lesion, 20 %stenosed. ?Prox LAD to Mid LAD lesion, 30 %stenosed. ?The left ventricular systolic function is normal. ?LV end diastolic pressure is normal. ?The left ventricular ejection fraction is greater than 65% by visual estimate. ?There is no mitral valve regurgitation. ?   ?1. Single vessel CAD with patent stent RCA. The RCA stent has mild to moderate restenosis. This does not appear to be flow limiting.  ?2. Mild disease LAD ?3. Normal LV systolic function ?  ?Recommendations: Contin

## 2021-11-02 ENCOUNTER — Ambulatory Visit (INDEPENDENT_AMBULATORY_CARE_PROVIDER_SITE_OTHER): Payer: Medicare Other | Admitting: Cardiology

## 2021-11-02 ENCOUNTER — Encounter: Payer: Self-pay | Admitting: Cardiology

## 2021-11-02 VITALS — BP 114/72 | HR 65 | Ht <= 58 in | Wt 139.0 lb

## 2021-11-02 DIAGNOSIS — R002 Palpitations: Secondary | ICD-10-CM | POA: Diagnosis not present

## 2021-11-02 DIAGNOSIS — I2510 Atherosclerotic heart disease of native coronary artery without angina pectoris: Secondary | ICD-10-CM

## 2021-11-02 DIAGNOSIS — E785 Hyperlipidemia, unspecified: Secondary | ICD-10-CM

## 2021-11-02 DIAGNOSIS — I10 Essential (primary) hypertension: Secondary | ICD-10-CM | POA: Diagnosis not present

## 2021-11-02 DIAGNOSIS — I5032 Chronic diastolic (congestive) heart failure: Secondary | ICD-10-CM

## 2021-11-02 LAB — BASIC METABOLIC PANEL
BUN/Creatinine Ratio: 31 — ABNORMAL HIGH (ref 12–28)
BUN: 24 mg/dL (ref 8–27)
CO2: 25 mmol/L (ref 20–29)
Calcium: 10.1 mg/dL (ref 8.7–10.3)
Chloride: 100 mmol/L (ref 96–106)
Creatinine, Ser: 0.77 mg/dL (ref 0.57–1.00)
Glucose: 145 mg/dL — ABNORMAL HIGH (ref 70–99)
Potassium: 4.3 mmol/L (ref 3.5–5.2)
Sodium: 138 mmol/L (ref 134–144)
eGFR: 79 mL/min/{1.73_m2} (ref >59)

## 2021-11-02 MED ORDER — SPIRONOLACTONE 25 MG PO TABS: 12.5000 mg | ORAL_TABLET | Freq: Every day | ORAL | 3 refills | Status: DC

## 2021-11-02 NOTE — Patient Instructions (Signed)
Medication Instructions:  ?Your physician has recommended you make the following change in your medication:  ? ?START: spironolactone 25 MG tablet: Take 1/2 tablet (12.5 MG) by mouth once a day ? ?We will determine on whether or not to continue or stop potassium after your lab results ? ?*If you need a refill on your cardiac medications before your next appointment, please call your pharmacy* ? ? ?Lab Work: ?TODAY: BMET ? ?IN 7-10 Days: BMET ? ?If you have labs (blood work) drawn today and your tests are completely normal, you will receive your results only by: ?MyChart Message (if you have MyChart) OR ?A paper copy in the mail ?If you have any lab test that is abnormal or we need to change your treatment, we will call you to review the results. ? ? ?Testing/Procedures: ?None  ? ?Follow-Up: ?At [facility], you and your health needs are our priority.  As part of our continuing mission to provide you with exceptional heart care, we have created designated Provider Care Teams.  These Care Teams include your primary Cardiologist (physician) and Advanced Practice Providers (APPs -  Physician Assistants and Nurse Practitioners) who all work together to provide you with the care you need, when you need it. ? ?We recommend signing up for the patient portal called "MyChart".  Sign up information is provided on this After Visit Summary.  MyChart is used to connect with patients for Virtual Visits (Telemedicine).  Patients are able to view lab/test results, encounter notes, upcoming appointments, etc.  Non-urgent messages can be sent to your provider as well.   ?To learn more about what you can do with MyChart, go to [email or web address].   ? ?Your next appointment:   ?6 month(s) ? ?The format for your next appointment:   ?In Person ? ?Provider:   ?Gwyndolyn Kaufman, MD or Ermalinda Barrios, PA-C      ? ? ?Important Information About Sugar ? ? ? ? ?  ?

## 2021-11-02 NOTE — Progress Notes (Signed)
?Cardiology Office Note:   ? ?Date:  11/02/2021  ? ?ID:  Rebecca [name], DOB 25-Dec-1945, MRN [id number] ? ?PCP:  [name], NP ?  ?[facility] Providers ?Cardiologist:  Ena Dawley, MD ?Cardiology APP:  [name], PA-C  ?{  ? ?Referring MD: [name], NP  ? ? ?History of Present Illness:   ? ?Rebecca [name] is a 76 y.o. female with a hx of of CAD status post PCI to the RCA in 2010,  hyperlipidemia, HTN, chronic diastolic CHF who presents to clinic for follow-up. Was previously followed by by Dr. [name]. ? ?Per review of the record, patient has known CAD with RCA PCI in 2010. Repeat cath in 04/2017 with mild disease, patent RCA stent. Recommended for medical management. TTE 10/2020 with normal EF 60-65%, G1DD, RV okay, trivial MR, RAP 81mHg.  ? ?Last saw [name] 09/2020 where she was doing well from a CV standpoint. Palpitations controlled on dilt. ? ?Today, she is doing well. At 76years old, she was working at a health fair with 2 nurses and notices she had to hold herself up against the table. She remembers feeling poorly and drove herself home and believes she lost consciousness in her bed. In the following 3 days, she went to work and continued to feel poorly. A doctor at her workplace gave her nitroglycerin and morphine and rushed her to the ER. She was found to have complete occlusion of the R coronary artery but was unable to undergo surgery at the time. Had subsequent PCI to RCA in 2010. ? ?She takes 40 mg Lasix daily for bilateral LE edema. If she notices increased swelling, she takes an additional potassium and Lasix. She is unable to take metformin or glipizide due to cramps. Trulicity was also ineffective and not covered by her insurance. She hopes she does not have to start insulin. Every morning, she takes diltiazem for palpitations. She has noticed improvement after taking the medication.  ? ?She stays active by attending exercise classes. Her diet is also reportedly  good. She denies chest pain, chest pressure, dyspnea at rest or with exertion, PND, orthopnea, or syncope. Compliant with all medications.  ? ?Past Medical History:  ?Diagnosis Date  ? Asthma   ? extrinsic  ? CAD (coronary artery disease)   ? Chest pain, atypical   ? Complication of anesthesia   ? Diabetes ([place]   ? Edema   ? Fatigue   ? Fatigue   ? GERD (gastroesophageal reflux disease)   ? Hyperlipidemia   ? Hypertension   ? familiar  ? Hypothyroidism   ? IBS (irritable bowel syndrome)   ? hx  ? Keratosis, seborrheic   ? Labyrinthitis   ? [place]   ? age 76 ? Osteoarthritis   ? PONV (postoperative nausea and vomiting)   ? S/P arthroscopy of right shoulder 03/01/2018  ? Skin cancer   ? Melanoma   ? ? ?Past Surgical History:  ?Procedure Laterality Date  ? Breast Biopsy    ? dense breast tissue  ? BREAST EXCISIONAL BIOPSY Left 2000  ? benign  ? CHOLECYSTECTOMY    ? EUS  04/04/2012  ? Procedure: UPPER ENDOSCOPIC ULTRASOUND (EUS) LINEAR;  Surgeon: DMilus Banister MD;  [facility];  Service: Endoscopy;  Laterality: N/A;  radial linear  ? KNEE ARTHROPLASTY    ? total right  02-28-2010 Dr FGaynelle Arabian left  ? KNEE ARTHROPLASTY Bilateral 09/12/2017  ? FPilar Plate  Alusio  ? LEFT HEART CATH AND CORONARY ANGIOGRAPHY N/A 04/20/2017  ? Procedure: LEFT HEART CATH AND CORONARY ANGIOGRAPHY;  Surgeon: Burnell Blanks, MD;  Location: [facility];  Service: Cardiovascular;  Laterality: N/A;  ? TOTAL HIP ARTHROPLASTY Right 11/12/2019  ? Procedure: TOTAL HIP ARTHROPLASTY ANTERIOR APPROACH;  Surgeon: Gaynelle Arabian, MD;  [facility]: [facility];  Service: Orthopedics;  Laterality: Right;  149mn  ? TUBAL LIGATION    ? ? ?Current Medications: ?Current Meds  ?Medication Sig  ? Accu-Chek FastClix Lancets MISC USE   TO CHECK GLUCOSE TWICE DAILY  ? ACCU-CHEK GUIDE test strip Use to test blood glucose three daily  ? acetaminophen (TYLENOL) 325 MG tablet Take 650 mg by mouth every 6 (six) hours as needed  for moderate pain.  ? atorvastatin (LIPITOR) 80 MG tablet TAKE 1 TABLET BY MOUTH  DAILY  ? buPROPion (WELLBUTRIN XL) 150 MG 24 hr tablet Take 1 tablet (150 mg total) by mouth daily.  ? conjugated estrogens (PREMARIN) vaginal cream Place 0.5 Applicatorfuls vaginally every 7 (seven) days.  ? denosumab (PROLIA) 60 MG/ML SOSY injection Inject 60 mg into the skin every 6 (six) months.  ? Diclofenac Sodium 1 % CREA Apply topically.  ? diltiazem (CARDIZEM CD) 240 MG 24 hr capsule TAKE 1 CAPSULE(240 MG) BY MOUTH DAILY  ? empagliflozin (JARDIANCE) 25 MG TABS tablet Take 1 tablet (25 mg total) by mouth daily before breakfast.  ? furosemide (LASIX) 20 MG tablet TAKE 1 TABLET BY MOUTH  TWICE DAILY  ? levothyroxine (SYNTHROID) 50 MCG tablet TAKE 1 TABLET BY MOUTH  DAILY BEFORE BREAKFAST  ? nitroGLYCERIN (NITROSTAT) 0.4 MG SL tablet DISSOLVE 1 TABLET UNDER THE TONGUE EVERY 5 MINUTES AS&nbsp;&nbsp;NEEDED FOR CHEST PAIN. MAX&nbsp;&nbsp;3 DOSES IN 15 MINUTES. CALL 911 IF CHEST PAIN PERSISTS  ? potassium chloride SA (K-DUR) 20 MEQ tablet Take 1 tablet (20 mEq total) by mouth 2 (two) times daily.  ? spironolactone (ALDACTONE) 25 MG tablet Take 0.5 tablets (12.5 mg total) by mouth daily.  ?  ? ?Allergies:   Sulfa antibiotics and Sulfonamide derivatives  ? ?Social History  ? ?Socioeconomic History  ? Marital status: Married  ?  Spouse name: Not on file  ? Number of children: 2  ? Years of education: Not on file  ? Highest education level: Not on file  ?Occupational History  ? Occupation: RN-retired  ?  Employer: RETIRED  ?Tobacco Use  ? Smoking status: Never  ? Smokeless tobacco: Never  ?Vaping Use  ? Vaping Use: Never used  ?Substance and Sexual Activity  ? Alcohol use: No  ?  Comment: rarely  ? Drug use: No  ? Sexual activity: Not on file  ?Other Topics Concern  ? Not on file  ?Social History Narrative  ? ** Merged History Encounter **  ?    ? ?Social Determinants of Health  ? ?Financial Resource Strain: Low Risk   ? Difficulty of  Paying Living Expenses: Not hard at all  ?Food Insecurity: No Food Insecurity  ? Worried About RCharity fundraiserin the Last Year: Never true  ? Ran Out of Food in the Last Year: Never true  ?Transportation Needs: No Transportation Needs  ? Lack of Transportation (Medical): No  ? Lack of Transportation (Non-Medical): No  ?Physical Activity: Sufficiently Active  ? Days of Exercise per Week: 5 days  ? Minutes of Exercise per Session: 60 min  ?Stress: Stress Concern Present  ? Feeling of Stress : To some  extent  ?Social Connections: Moderately Integrated  ? Frequency of Communication with Friends and Family: More than three times a week  ? Frequency of Social Gatherings with Friends and Family: More than three times a week  ? Attends Religious Services: More than 4 times per year  ? Active Member of Clubs or Organizations: No  ? Attends Archivist Meetings: Never  ? Marital Status: Married  ?  ? ?Family History: ?The patient's family history includes Breast cancer in her sister; Heart attack (age of onset: 59) in her mother; Heart disease in her mother; Hypertension in her father, mother, and sister; Melanoma (age of onset: 84) in her daughter. ? ?ROS:   ?Please see the history of present illness.    ?Review of Systems  ?Constitutional:  Negative for malaise/fatigue and weight loss.  ?HENT:  Negative for congestion and sore throat.   ?Eyes:  Negative for blurred vision.  ?Respiratory:  Negative for cough and shortness of breath.   ?Cardiovascular:  Positive for palpitations and leg swelling. Negative for chest pain, orthopnea, claudication and PND.  ?Gastrointestinal:  Negative for heartburn and nausea.  ?Genitourinary:  Negative for frequency and urgency.  ?Musculoskeletal:  Negative for joint pain and myalgias.  ?Skin:  Negative for itching.  ?Neurological:  Negative for dizziness and headaches.  ?Endo/Heme/Allergies:  Does not bruise/bleed easily.  ?Psychiatric/Behavioral:  The patient is not  nervous/anxious and does not have insomnia.   ?All other systems reviewed and are negative. ? ?EKGs/Labs/Other Studies Reviewed:   ? ?The following studies were reviewed today: ?TTE 10/2020: ?IMPRESSIONS: ? 1. Left v

## 2021-11-11 ENCOUNTER — Other Ambulatory Visit: Payer: Medicare Other | Admitting: *Deleted

## 2021-11-11 DIAGNOSIS — I5032 Chronic diastolic (congestive) heart failure: Secondary | ICD-10-CM | POA: Diagnosis not present

## 2021-11-11 DIAGNOSIS — R002 Palpitations: Secondary | ICD-10-CM

## 2021-11-11 DIAGNOSIS — I2510 Atherosclerotic heart disease of native coronary artery without angina pectoris: Secondary | ICD-10-CM | POA: Diagnosis not present

## 2021-11-11 LAB — BASIC METABOLIC PANEL WITH GFR
BUN/Creatinine Ratio: 19 (ref 12–28)
BUN: 17 mg/dL (ref 8–27)
CO2: 23 mmol/L (ref 20–29)
Calcium: 10 mg/dL (ref 8.7–10.3)
Chloride: 98 mmol/L (ref 96–106)
Creatinine, Ser: 0.89 mg/dL (ref 0.57–1.00)
Glucose: 129 mg/dL — ABNORMAL HIGH (ref 70–99)
Potassium: 4.8 mmol/L (ref 3.5–5.2)
Sodium: 136 mmol/L (ref 134–144)
eGFR: 67 mL/min/1.73

## 2021-11-14 ENCOUNTER — Telehealth: Payer: Self-pay | Admitting: *Deleted

## 2021-11-14 MED ORDER — SPIRONOLACTONE 25 MG PO TABS: 12.5000 mg | ORAL_TABLET | Freq: Every day | ORAL | 3 refills | Status: DC

## 2021-11-14 NOTE — Telephone Encounter (Signed)
The patient has been notified of the result and verbalized understanding.  All questions (if any) were answered. Nuala Alpha, LPN 1/0/6269 4:85 AM  ? ?Pt requested a 3 month supply of her spironolactone be sent to her mail order pharmacy for 90 day supply for coverage.  Rx sent to [facility] as pt requested.  ?Pt verbalized understanding and agrees with this plan. ?

## 2021-11-14 NOTE — Telephone Encounter (Signed)
-----   Message from Freada Bergeron, MD sent at 11/13/2021  7:27 PM EDT ----- ?Kidney function and electrolytes are stable. ?

## 2021-11-29 ENCOUNTER — Telehealth (INDEPENDENT_AMBULATORY_CARE_PROVIDER_SITE_OTHER): Payer: Medicare Other | Admitting: Adult Health

## 2021-11-29 ENCOUNTER — Other Ambulatory Visit: Payer: Self-pay | Admitting: Adult Health

## 2021-11-29 ENCOUNTER — Encounter: Payer: Self-pay | Admitting: Adult Health

## 2021-11-29 DIAGNOSIS — E119 Type 2 diabetes mellitus without complications: Secondary | ICD-10-CM

## 2021-11-29 DIAGNOSIS — G4700 Insomnia, unspecified: Secondary | ICD-10-CM

## 2021-11-29 DIAGNOSIS — I2510 Atherosclerotic heart disease of native coronary artery without angina pectoris: Secondary | ICD-10-CM | POA: Diagnosis not present

## 2021-11-29 DIAGNOSIS — F419 Anxiety disorder, unspecified: Secondary | ICD-10-CM

## 2021-11-29 DIAGNOSIS — F32A Depression, unspecified: Secondary | ICD-10-CM | POA: Diagnosis not present

## 2021-11-29 MED ORDER — BUPROPION HCL ER (XL) 300 MG PO TB24: 300.0000 mg | ORAL_TABLET | Freq: Every day | ORAL | 0 refills | Status: DC

## 2021-11-29 MED ORDER — ALPRAZOLAM 0.5 MG PO TABS: 0.2500 mg | ORAL_TABLET | Freq: Every day | ORAL | 2 refills | Status: DC

## 2021-11-29 MED ORDER — ESZOPICLONE 1 MG PO TABS: 1.0000 mg | ORAL_TABLET | Freq: Every day | ORAL | 1 refills | Status: DC

## 2021-11-29 NOTE — Progress Notes (Signed)
Virtual Visit via Video Note ? ?I connected with Rebecca [name] on 11/29/21 at 11:30 AM EDT by a video enabled telemedicine application and verified that I am speaking with the correct person using two identifiers. ? Location patient: home ?Location provider:work or home office ?Persons participating in the virtual visit: patient, provider ? ?I discussed the limitations of evaluation and management by telemedicine and the availability of in person appointments. The patient expressed understanding and agreed to proceed. ? ? ?HPI: ?76 year old female who is being evaluated today for 30-day follow-up regarding anxiety/depression/insomnia. ? ?When she was last seen she was switched from Celexa to Wellbutrin as the Celexa was making her too sedated during the day.  It was also difficult for her to sleep as she had racing thoughts going through her head.  Most of her anxiety and depression comes from her husband's memory loss. ? ?She reports that since the switch to Wellbutrin her anxiety and depression have improved.  Some nights she will get really good sleep but other nights she continues to be awake for most of the night.  She has not tolerated trazodone in the past.  Has tried Xanax but this did not work always work either.  Her last prescription for Xanax was in 2021. ? ? ?ROS: See pertinent positives and negatives per HPI. ? ?Past Medical History:  ?Diagnosis Date  ? Asthma   ? extrinsic  ? CAD (coronary artery disease)   ? Chest pain, atypical   ? Complication of anesthesia   ? Diabetes ([place])   ? Edema   ? Fatigue   ? Fatigue   ? GERD (gastroesophageal reflux disease)   ? Hyperlipidemia   ? Hypertension   ? familiar  ? Hypothyroidism   ? IBS (irritable bowel syndrome)   ? hx  ? Keratosis, seborrheic   ? Labyrinthitis   ? MI (myocardial infarction) ([place])   ? age 68  ? Osteoarthritis   ? PONV (postoperative nausea and vomiting)   ? S/P arthroscopy of right shoulder 03/01/2018  ? Skin cancer   ? Melanoma   ? ? ?Past  Surgical History:  ?Procedure Laterality Date  ? Breast Biopsy    ? dense breast tissue  ? BREAST EXCISIONAL BIOPSY Left 2000  ? benign  ? CHOLECYSTECTOMY    ? EUS  04/04/2012  ? Procedure: UPPER ENDOSCOPIC ULTRASOUND (EUS) LINEAR;  Surgeon: Milus Banister, MD;  [facility];  Service: Endoscopy;  Laterality: N/A;  radial linear  ? KNEE ARTHROPLASTY    ? total right  02-28-2010 Dr Gaynelle Arabian, left  ? KNEE ARTHROPLASTY Bilateral 09/12/2017  ? Pilar Plate Alusio  ? LEFT HEART CATH AND CORONARY ANGIOGRAPHY N/A 04/20/2017  ? Procedure: LEFT HEART CATH AND CORONARY ANGIOGRAPHY;  Surgeon: Burnell Blanks, MD;  Location: [facility];  Service: Cardiovascular;  Laterality: N/A;  ? TOTAL HIP ARTHROPLASTY Right 11/12/2019  ? Procedure: TOTAL HIP ARTHROPLASTY ANTERIOR APPROACH;  Surgeon: Gaynelle Arabian, MD;  [facility];  Service: Orthopedics;  Laterality: Right;  133mn  ? TUBAL LIGATION    ? ? ?Family History  ?Problem Relation Age of Onset  ? Heart disease Mother   ? Hypertension Mother   ? Heart attack Mother 458 ?     MULTIPLES OVER TIME  ? Melanoma Daughter 273 ?     METASTATIC  ? Breast cancer Sister   ? Hypertension Father   ? Hypertension Sister   ? ? ? ? ? ?Current Outpatient  Medications:  ?  Accu-Chek FastClix Lancets MISC, USE   TO CHECK GLUCOSE TWICE DAILY, Disp: 102 each, Rfl: 3 ?  ACCU-CHEK GUIDE test strip, Use to test blood glucose three daily, Disp: 300 each, Rfl: 3 ?  acetaminophen (TYLENOL) 325 MG tablet, Take 650 mg by mouth every 6 (six) hours as needed for moderate pain., Disp: , Rfl:  ?  atorvastatin (LIPITOR) 80 MG tablet, TAKE 1 TABLET BY MOUTH  DAILY, Disp: 90 tablet, Rfl: 3 ?  buPROPion (WELLBUTRIN XL) 150 MG 24 hr tablet, Take 1 tablet (150 mg total) by mouth daily., Disp: 30 tablet, Rfl: 0 ?  buPROPion (WELLBUTRIN XL) 300 MG 24 hr tablet, Take 1 tablet (300 mg total) by mouth daily., Disp: 30 tablet, Rfl: 0 ?  conjugated estrogens (PREMARIN) vaginal cream, Place 0.5  Applicatorfuls vaginally every 7 (seven) days., Disp: 42.5 g, Rfl: 6 ?  denosumab (PROLIA) 60 MG/ML SOSY injection, Inject 60 mg into the skin every 6 (six) months., Disp: , Rfl:  ?  Diclofenac Sodium 1 % CREA, Apply topically., Disp: , Rfl:  ?  diltiazem (CARDIZEM CD) 240 MG 24 hr capsule, TAKE 1 CAPSULE(240 MG) BY MOUTH DAILY, Disp: 90 capsule, Rfl: 3 ?  empagliflozin (JARDIANCE) 25 MG TABS tablet, Take 1 tablet (25 mg total) by mouth daily before breakfast., Disp: 90 tablet, Rfl: 1 ?  eszopiclone (LUNESTA) 1 MG TABS tablet, Take 1 tablet (1 mg total) by mouth at bedtime. Take immediately before bedtime, Disp: 30 tablet, Rfl: 1 ?  furosemide (LASIX) 20 MG tablet, TAKE 1 TABLET BY MOUTH  TWICE DAILY, Disp: 180 tablet, Rfl: 3 ?  levothyroxine (SYNTHROID) 50 MCG tablet, TAKE 1 TABLET BY MOUTH  DAILY BEFORE BREAKFAST, Disp: 90 tablet, Rfl: 3 ?  nitroGLYCERIN (NITROSTAT) 0.4 MG SL tablet, DISSOLVE 1 TABLET UNDER THE TONGUE EVERY 5 MINUTES AS&nbsp;&nbsp;NEEDED FOR CHEST PAIN. MAX&nbsp;&nbsp;3 DOSES IN 15 MINUTES. CALL 911 IF CHEST PAIN PERSISTS, Disp: 100 tablet, Rfl: 6 ?  potassium chloride SA (K-DUR) 20 MEQ tablet, Take 1 tablet (20 mEq total) by mouth 2 (two) times daily., Disp: 180 tablet, Rfl: 3 ?  spironolactone (ALDACTONE) 25 MG tablet, Take 0.5 tablets (12.5 mg total) by mouth daily., Disp: 45 tablet, Rfl: 3 ?  ALPRAZolam (XANAX) 0.5 MG tablet, Take 0.5-1 tablets (0.25-0.5 mg total) by mouth daily. for sleep, Disp: 30 tablet, Rfl: 2 ? ?EXAM: ? ?VITALS per patient if applicable: ? ?GENERAL: alert, oriented, appears well and in no acute distress ? ?HEENT: atraumatic, conjunttiva clear, no obvious abnormalities on inspection of external nose and ears ? ?NECK: normal movements of the head and neck ? ?LUNGS: on inspection no signs of respiratory distress, breathing rate appears normal, no obvious gross SOB, gasping or wheezing ? ?CV: no obvious cyanosis ? ?MS: moves all visible extremities without noticeable  abnormality ? ?PSYCH/NEURO: pleasant and cooperative, no obvious depression or anxiety, speech and thought processing grossly intact ? ?ASSESSMENT AND PLAN: ? ?Discussed the following assessment and plan: ? ?Anxiety and depression - Plan: buPROPion (WELLBUTRIN XL) 300 MG 24 hr tablet, ALPRAZolam (XANAX) 0.5 MG tablet ? ?Insomnia, unspecified type - Plan: eszopiclone (LUNESTA) 1 MG TABS tablet ? ?Will increase Wellbutrin to 300 mg extended release daily.  Refill Xanax to take as needed.  We will trial her on Lunesta 1 mg for insomnia.  Advised not to take Lunesta  and Xanax together.  She will follow-up in 30 days ?  ?I discussed the assessment and treatment plan with the  patient. The patient was provided an opportunity to ask questions and all were answered. The patient agreed with the plan and demonstrated an understanding of the instructions. ?  ?The patient was advised to call back or seek an in-person evaluation if the symptoms worsen or if the condition fails to improve as anticipated. ? ? ?[name], NP  ? ? ? ?

## 2021-11-30 ENCOUNTER — Other Ambulatory Visit: Payer: Self-pay | Admitting: Adult Health

## 2021-11-30 DIAGNOSIS — E119 Type 2 diabetes mellitus without complications: Secondary | ICD-10-CM

## 2021-12-14 ENCOUNTER — Telehealth (INDEPENDENT_AMBULATORY_CARE_PROVIDER_SITE_OTHER): Payer: Medicare Other | Admitting: Adult Health

## 2021-12-14 ENCOUNTER — Encounter: Payer: Self-pay | Admitting: Adult Health

## 2021-12-14 VITALS — Ht <= 58 in | Wt 140.0 lb

## 2021-12-14 DIAGNOSIS — I2510 Atherosclerotic heart disease of native coronary artery without angina pectoris: Secondary | ICD-10-CM

## 2021-12-14 DIAGNOSIS — R058 Other specified cough: Secondary | ICD-10-CM | POA: Diagnosis not present

## 2021-12-14 DIAGNOSIS — U071 COVID-19: Secondary | ICD-10-CM | POA: Diagnosis not present

## 2021-12-14 MED ORDER — HYDROCODONE BIT-HOMATROP MBR 5-1.5 MG/5ML PO SOLN: 5.0000 mL | Freq: Three times a day (TID) | ORAL | 0 refills | Status: DC | PRN

## 2021-12-14 MED ORDER — NIRMATRELVIR/RITONAVIR (PAXLOVID)TABLET: 3.0000 | ORAL_TABLET | Freq: Two times a day (BID) | ORAL | 0 refills | Status: AC

## 2021-12-14 MED ORDER — ALBUTEROL SULFATE HFA 108 (90 BASE) MCG/ACT IN AERS: 2.0000 | INHALATION_SPRAY | Freq: Four times a day (QID) | RESPIRATORY_TRACT | 0 refills | Status: DC | PRN

## 2021-12-14 NOTE — Progress Notes (Signed)
Virtual Visit via Video Note  I connected with Rebecca [name] on 12/14/21 at  3:15 PM EDT by a video enabled telemedicine application and verified that I am speaking with the correct person using two identifiers.  Location patient: home Location provider:work or home office Persons participating in the virtual visit: patient, provider  I discussed the limitations of evaluation and management by telemedicine and the availability of in person appointments. The patient expressed understanding and agreed to proceed.   HPI: 76 year old female who is being evaluated today for an acute issue of COVID-19 infection.  She tested +3 days ago when her symptoms started.  Her symptoms include body aches, fatigue, chest congestion, some mild shortness of breath, wheezing, dry cough that is keeping her up at night, and decreased appetite.  She feels as though some symptoms are improving but others are worsening.  She has not experienced any fevers, chills, or GI issues.  Her husband has the same symptoms  She is interested in antiviral treatment   ROS: See pertinent positives and negatives per HPI.  Past Medical History:  Diagnosis Date   Asthma    extrinsic   CAD (coronary artery disease)    Chest pain, atypical    Complication of anesthesia    Diabetes ([place])    Edema    Fatigue    Fatigue    GERD (gastroesophageal reflux disease)    Hyperlipidemia    Hypertension    familiar   Hypothyroidism    IBS (irritable bowel syndrome)    hx   Keratosis, seborrheic    Labyrinthitis    MI (myocardial infarction) ([place])    age 53   Osteoarthritis    PONV (postoperative nausea and vomiting)    S/P arthroscopy of right shoulder 03/01/2018   Skin cancer    Melanoma     Past Surgical History:  Procedure Laterality Date   Breast Biopsy     dense breast tissue   BREAST EXCISIONAL BIOPSY Left 2000   benign   CHOLECYSTECTOMY     EUS  04/04/2012   Procedure: UPPER ENDOSCOPIC ULTRASOUND (EUS)  LINEAR;  Surgeon: Milus Banister, MD;  [facility];  Service: Endoscopy;  Laterality: N/A;  radial linear   KNEE ARTHROPLASTY     total right  02-28-2010 Dr Gaynelle Arabian, left   KNEE ARTHROPLASTY Bilateral 09/12/2017   Hector Shade   LEFT HEART CATH AND CORONARY ANGIOGRAPHY N/A 04/20/2017   Procedure: LEFT HEART CATH AND CORONARY ANGIOGRAPHY;  Surgeon: Burnell Blanks, MD;  Location: [facility];  Service: Cardiovascular;  Laterality: N/A;   TOTAL HIP ARTHROPLASTY Right 11/12/2019   Procedure: TOTAL HIP ARTHROPLASTY ANTERIOR APPROACH;  Surgeon: Gaynelle Arabian, MD;  [facility];  Service: Orthopedics;  Laterality: Right;  114mn   TUBAL LIGATION      Family History  Problem Relation Age of Onset   Heart disease Mother    Hypertension Mother    Heart attack Mother 483      MULTIPLES OVER TIME   Melanoma Daughter 250      METASTATIC   Breast cancer Sister    Hypertension Father    Hypertension Sister        Current Outpatient Medications:    Accu-Chek FastClix Lancets MISC, USE   TO CHECK GLUCOSE TWICE DAILY, Disp: 102 each, Rfl: 3   ACCU-CHEK GUIDE test strip, USE TO TEST BLOOD GLUCOSE TWICE DAILY, Disp: 200 strip, Rfl: 1   acetaminophen (TYLENOL) 325  MG tablet, Take 650 mg by mouth every 6 (six) hours as needed for moderate pain., Disp: , Rfl:    ALPRAZolam (XANAX) 0.5 MG tablet, Take 0.5-1 tablets (0.25-0.5 mg total) by mouth daily. for sleep, Disp: 30 tablet, Rfl: 2   atorvastatin (LIPITOR) 80 MG tablet, TAKE 1 TABLET BY MOUTH  DAILY, Disp: 90 tablet, Rfl: 3   buPROPion (WELLBUTRIN XL) 150 MG 24 hr tablet, Take 1 tablet (150 mg total) by mouth daily., Disp: 30 tablet, Rfl: 0   buPROPion (WELLBUTRIN XL) 300 MG 24 hr tablet, Take 1 tablet (300 mg total) by mouth daily., Disp: 30 tablet, Rfl: 0   conjugated estrogens (PREMARIN) vaginal cream, Place 0.5 Applicatorfuls vaginally every 7 (seven) days., Disp: 42.5 g, Rfl: 6   denosumab (PROLIA) 60 MG/ML  SOSY injection, Inject 60 mg into the skin every 6 (six) months., Disp: , Rfl:    Diclofenac Sodium 1 % CREA, Apply topically., Disp: , Rfl:    diltiazem (CARDIZEM CD) 240 MG 24 hr capsule, TAKE 1 CAPSULE(240 MG) BY MOUTH DAILY, Disp: 90 capsule, Rfl: 3   empagliflozin (JARDIANCE) 25 MG TABS tablet, Take 1 tablet (25 mg total) by mouth daily before breakfast., Disp: 90 tablet, Rfl: 1   eszopiclone (LUNESTA) 1 MG TABS tablet, Take 1 tablet (1 mg total) by mouth at bedtime. Take immediately before bedtime, Disp: 30 tablet, Rfl: 1   furosemide (LASIX) 20 MG tablet, TAKE 1 TABLET BY MOUTH  TWICE DAILY, Disp: 180 tablet, Rfl: 3   levothyroxine (SYNTHROID) 50 MCG tablet, TAKE 1 TABLET BY MOUTH  DAILY BEFORE BREAKFAST, Disp: 90 tablet, Rfl: 3   nitroGLYCERIN (NITROSTAT) 0.4 MG SL tablet, DISSOLVE 1 TABLET UNDER THE TONGUE EVERY 5 MINUTES AS&nbsp;&nbsp;NEEDED FOR CHEST PAIN. MAX&nbsp;&nbsp;3 DOSES IN 15 MINUTES. CALL 911 IF CHEST PAIN PERSISTS, Disp: 100 tablet, Rfl: 6   potassium chloride SA (K-DUR) 20 MEQ tablet, Take 1 tablet (20 mEq total) by mouth 2 (two) times daily., Disp: 180 tablet, Rfl: 3   spironolactone (ALDACTONE) 25 MG tablet, Take 0.5 tablets (12.5 mg total) by mouth daily., Disp: 45 tablet, Rfl: 3  EXAM:  VITALS per patient if applicable:  GENERAL: alert, oriented, appears well and in no acute distress  HEENT: atraumatic, conjunttiva clear, no obvious abnormalities on inspection of external nose and ears  NECK: normal movements of the head and neck  LUNGS: on inspection no signs of respiratory distress, breathing rate appears normal, no obvious gross SOB, gasping or wheezing  CV: no obvious cyanosis  MS: moves all visible extremities without noticeable abnormality  PSYCH/NEURO: pleasant and cooperative, no obvious depression or anxiety, speech and thought processing grossly intact  ASSESSMENT AND PLAN:  Discussed the following assessment and plan:  1. COVID-19 virus  infection  - nirmatrelvir/ritonavir EUA (PAXLOVID) 20 x 150 MG & 10 x '100MG'$  TABS; Take 3 tablets by mouth 2 (two) times daily for 5 days. (Take nirmatrelvir 150 mg two tablets twice daily for 5 days and ritonavir 100 mg one tablet twice daily for 5 days) Patient GFR is 67  Dispense: 30 tablet; Refill: 0 - Advised follow up if not improving in the next 2-3 days   2. Other cough  - nirmatrelvir/ritonavir EUA (PAXLOVID) 20 x 150 MG & 10 x '100MG'$  TABS; Take 3 tablets by mouth 2 (two) times daily for 5 days. (Take nirmatrelvir 150 mg two tablets twice daily for 5 days and ritonavir 100 mg one tablet twice daily for 5 days) Patient GFR is  67  Dispense: 30 tablet; Refill: 0 - HYDROcodone bit-homatropine (HYCODAN) 5-1.5 MG/5ML syrup; Take 5 mLs by mouth every 8 (eight) hours as needed for cough.  Dispense: 120 mL; Refill: 0 - albuterol (VENTOLIN HFA) 108 (90 Base) MCG/ACT inhaler; Inhale 2 puffs into the lungs every 6 (six) hours as needed for wheezing or shortness of breath.  Dispense: 8 g; Refill: 0       I discussed the assessment and treatment plan with the patient. The patient was provided an opportunity to ask questions and all were answered. The patient agreed with the plan and demonstrated an understanding of the instructions.   The patient was advised to call back or seek an in-person evaluation if the symptoms worsen or if the condition fails to improve as anticipated.   [name], NP

## 2021-12-25 ENCOUNTER — Other Ambulatory Visit: Payer: Self-pay | Admitting: Adult Health

## 2021-12-25 DIAGNOSIS — F32A Depression, unspecified: Secondary | ICD-10-CM

## 2021-12-27 ENCOUNTER — Telehealth: Payer: Medicare Other | Admitting: Adult Health

## 2021-12-27 ENCOUNTER — Other Ambulatory Visit: Payer: Self-pay | Admitting: Adult Health

## 2021-12-27 DIAGNOSIS — F419 Anxiety disorder, unspecified: Secondary | ICD-10-CM

## 2021-12-29 ENCOUNTER — Ambulatory Visit (INDEPENDENT_AMBULATORY_CARE_PROVIDER_SITE_OTHER): Payer: Medicare Other | Admitting: Adult Health

## 2021-12-29 ENCOUNTER — Encounter: Payer: Self-pay | Admitting: Adult Health

## 2021-12-29 VITALS — BP 130/70 | HR 75 | Temp 97.8°F | Ht <= 58 in | Wt 137.0 lb

## 2021-12-29 DIAGNOSIS — I2510 Atherosclerotic heart disease of native coronary artery without angina pectoris: Secondary | ICD-10-CM | POA: Diagnosis not present

## 2021-12-29 DIAGNOSIS — F419 Anxiety disorder, unspecified: Secondary | ICD-10-CM

## 2021-12-29 DIAGNOSIS — E039 Hypothyroidism, unspecified: Secondary | ICD-10-CM | POA: Diagnosis not present

## 2021-12-29 DIAGNOSIS — E119 Type 2 diabetes mellitus without complications: Secondary | ICD-10-CM

## 2021-12-29 DIAGNOSIS — F32A Depression, unspecified: Secondary | ICD-10-CM

## 2021-12-29 DIAGNOSIS — I5032 Chronic diastolic (congestive) heart failure: Secondary | ICD-10-CM

## 2021-12-29 DIAGNOSIS — F5101 Primary insomnia: Secondary | ICD-10-CM | POA: Diagnosis not present

## 2021-12-29 DIAGNOSIS — E782 Mixed hyperlipidemia: Secondary | ICD-10-CM | POA: Diagnosis not present

## 2021-12-29 LAB — COMPREHENSIVE METABOLIC PANEL
ALT: 23 U/L (ref 0–35)
AST: 18 U/L (ref 0–37)
Albumin: 4.5 g/dL (ref 3.5–5.2)
Alkaline Phosphatase: 68 U/L (ref 39–117)
BUN: 18 mg/dL (ref 6–23)
CO2: 29 mEq/L (ref 19–32)
Calcium: 9.9 mg/dL (ref 8.4–10.5)
Chloride: 99 mEq/L (ref 96–112)
Creatinine, Ser: 0.9 mg/dL (ref 0.40–1.20)
GFR: 61.58 mL/min (ref >60.00)
Glucose, Bld: 158 mg/dL — ABNORMAL HIGH (ref 70–99)
Potassium: 3.9 mEq/L (ref 3.5–5.1)
Sodium: 137 mEq/L (ref 135–145)
Total Bilirubin: 0.3 mg/dL (ref 0.2–1.2)
Total Protein: 7 g/dL (ref 6.0–8.3)

## 2021-12-29 LAB — CBC WITH DIFFERENTIAL/PLATELET
Basophils Absolute: 0 10*3/uL (ref 0.0–0.1)
Basophils Relative: 0.3 % (ref 0.0–3.0)
Eosinophils Absolute: 0.1 10*3/uL (ref 0.0–0.7)
Eosinophils Relative: 1.2 % (ref 0.0–5.0)
HCT: 43.1 % (ref 36.0–46.0)
Hemoglobin: 14.4 g/dL (ref 12.0–15.0)
Lymphocytes Relative: 22.1 % (ref 12.0–46.0)
Lymphs Abs: 1.4 10*3/uL (ref 0.7–4.0)
MCHC: 33.4 g/dL (ref 30.0–36.0)
MCV: 92.5 fl (ref 78.0–100.0)
Monocytes Absolute: 0.4 10*3/uL (ref 0.1–1.0)
Monocytes Relative: 6.8 % (ref 3.0–12.0)
Neutro Abs: 4.3 10*3/uL (ref 1.4–7.7)
Neutrophils Relative %: 69.6 % (ref 43.0–77.0)
Platelets: 288 10*3/uL (ref 150.0–400.0)
RBC: 4.65 Mil/uL (ref 3.87–5.11)
RDW: 13.6 % (ref 11.5–15.5)
WBC: 6.2 10*3/uL (ref 4.0–10.5)

## 2021-12-29 LAB — MICROALBUMIN / CREATININE URINE RATIO
Creatinine,U: 23.6 mg/dL
Microalb Creat Ratio: 3 mg/g (ref 0.0–30.0)
Microalb, Ur: <0.7 mg/dL (ref 0.0–1.9)

## 2021-12-29 LAB — TSH: TSH: 1.35 u[IU]/mL (ref 0.35–5.50)

## 2021-12-29 LAB — LIPID PANEL
Cholesterol: 145 mg/dL (ref 0–200)
HDL: 63.7 mg/dL (ref >39.00)
LDL Cholesterol: 65 mg/dL (ref 0–99)
NonHDL: 81.3
Total CHOL/HDL Ratio: 2
Triglycerides: 84 mg/dL (ref 0.0–149.0)
VLDL: 16.8 mg/dL (ref 0.0–40.0)

## 2021-12-29 LAB — HEMOGLOBIN A1C: Hgb A1c MFr Bld: 7.9 % — ABNORMAL HIGH (ref 4.6–6.5)

## 2021-12-29 MED ORDER — POTASSIUM CHLORIDE CRYS ER 20 MEQ PO TBCR: 20.0000 meq | EXTENDED_RELEASE_TABLET | Freq: Two times a day (BID) | ORAL | 3 refills | Status: DC

## 2021-12-29 MED ORDER — ATORVASTATIN CALCIUM 80 MG PO TABS: 80.0000 mg | ORAL_TABLET | Freq: Every day | ORAL | 3 refills | Status: DC

## 2021-12-29 MED ORDER — FUROSEMIDE 20 MG PO TABS: 20.0000 mg | ORAL_TABLET | Freq: Two times a day (BID) | ORAL | 3 refills | Status: DC

## 2021-12-29 MED ORDER — ESZOPICLONE 1 MG PO TABS: 1.0000 mg | ORAL_TABLET | Freq: Every day | ORAL | 2 refills | Status: DC

## 2021-12-29 MED ORDER — BUPROPION HCL ER (XL) 300 MG PO TB24: ORAL_TABLET | ORAL | 1 refills | Status: DC

## 2021-12-29 MED ORDER — EMPAGLIFLOZIN 25 MG PO TABS: 25.0000 mg | ORAL_TABLET | Freq: Every day | ORAL | 3 refills | Status: DC

## 2021-12-29 NOTE — Progress Notes (Signed)
Complete physical exam  Patient: Rebecca [name]   DOB: 1946-05-03   76 y.o. Female  MRN: [id number]  Subjective:    No chief complaint on file.   Rebecca [name] is a 76 y.o. female who presents today for a complete physical exam. She reports consuming a diabetic,   calorie diet. Home exercise routine includes calisthenics. Gym/ health club routine includes cardio. She generally feels well. She reports sleeping well. She does have additional problems to discuss today.    DM type 2 -Currently prescribed Jardiance 25 mg daily.  He does monitor her blood sugars daily with readings in the 130s to 160s range with most of her blood sugars in the 140's In the past she has been on glipizide and metformin but this caused cramping. Lab Results  Component Value Date   HGBA1C 7.5 (A) 11/01/2021   CAD-status post PCI to RCA in 2010.  Her last cath in 2018 showed nonobstructive CAD with normal LVEF.  Currently managed by cardiology with Lipitor 80 mg daily.  She denies chest pain or shortness of breath. Lab Results  Component Value Date   CHOL 155 10/13/2020   HDL 60 10/13/2020   LDLCALC 78 10/13/2020   TRIG 92 10/13/2020   CHOLHDL 2.6 10/13/2020   Palpitations -managed by cardiology with diltiazem 240 mg daily.  Diastolic CHF-prescribed Lasix 20 mg BID and Spirolactone 12.5 mg daily.  She denies shortness of breath, chest pain, or lower extremity edema  Hypothyroidism-controlled with Synthroid 50 mcg daily.  Insomnia-the past she has not tolerated trazodone and Xanax does not help her sleep.  Most recently approximately 1 month ago was started on Lunesta 1 mg nightly. She reports that she is sleeping much better and is getting a full nights sleep most nights. She denies any side effects   Anxiety and Depression -most of her anxiety and depression stems from her husband's memory loss.  Most recently switched from Celexa to Wellbutrin is currently prescribed Wellbutrin 300 mg extended release.   She does feel as though this controls her symptoms well.  She will use Xanax 0.5 mg as needed   Most recent fall risk assessment:    04/20/2021    2:39 PM  [place] in the past year? 0  Number falls in past yr: 0  Injury with Fall? 0  Risk for fall due to : Impaired vision  Follow up Falls prevention discussed     Most recent depression screenings:    11/01/2021    9:43 AM 09/28/2021    3:26 PM  PHQ 2/9 Scores  PHQ - 2 Score 5 5  PHQ- 9 Score 21 18        Patient Care Team: [name], NP as PCP - General (Family Medicine) Dorothy Spark, MD as PCP - Cardiology (Cardiology) Florinda Marker as Physician Assistant (Physician Assistant) Sydnee Cabal, MD as Consulting Physician (Orthopedic Surgery) Gaynelle Arabian, MD as Consulting Physician (Orthopedic Surgery) [name], PA-C as Physician Assistant (Cardiology)   Outpatient Medications Prior to Visit  Medication Sig   Accu-Chek FastClix Lancets MISC USE   TO CHECK GLUCOSE TWICE DAILY   ACCU-CHEK GUIDE test strip USE TO TEST BLOOD GLUCOSE TWICE DAILY   acetaminophen (TYLENOL) 325 MG tablet Take 650 mg by mouth every 6 (six) hours as needed for moderate pain.   albuterol (VENTOLIN HFA) 108 (90 Base) MCG/ACT inhaler Inhale 2 puffs into the lungs every 6 (six) hours as needed  for wheezing or shortness of breath.   ALPRAZolam (XANAX) 0.5 MG tablet Take 0.5-1 tablets (0.25-0.5 mg total) by mouth daily. for sleep   conjugated estrogens (PREMARIN) vaginal cream Place 0.5 Applicatorfuls vaginally every 7 (seven) days.   denosumab (PROLIA) 60 MG/ML SOSY injection Inject 60 mg into the skin every 6 (six) months.   Diclofenac Sodium 1 % CREA Apply topically.   diltiazem (CARDIZEM CD) 240 MG 24 hr capsule TAKE 1 CAPSULE(240 MG) BY MOUTH DAILY   levothyroxine (SYNTHROID) 50 MCG tablet TAKE 1 TABLET BY MOUTH  DAILY BEFORE BREAKFAST   nitroGLYCERIN (NITROSTAT) 0.4 MG SL tablet DISSOLVE 1 TABLET UNDER THE  TONGUE EVERY 5 MINUTES AS&nbsp;&nbsp;NEEDED FOR CHEST PAIN. MAX&nbsp;&nbsp;3 DOSES IN 15 MINUTES. CALL 911 IF CHEST PAIN PERSISTS   spironolactone (ALDACTONE) 25 MG tablet Take 0.5 tablets (12.5 mg total) by mouth daily.   [DISCONTINUED] atorvastatin (LIPITOR) 80 MG tablet TAKE 1 TABLET BY MOUTH  DAILY   [DISCONTINUED] buPROPion (WELLBUTRIN XL) 300 MG 24 hr tablet TAKE 1 TABLET(300 MG) BY MOUTH DAILY   [DISCONTINUED] empagliflozin (JARDIANCE) 25 MG TABS tablet Take 1 tablet (25 mg total) by mouth daily before breakfast.   [DISCONTINUED] eszopiclone (LUNESTA) 1 MG TABS tablet Take 1 tablet (1 mg total) by mouth at bedtime. Take immediately before bedtime   [DISCONTINUED] furosemide (LASIX) 20 MG tablet TAKE 1 TABLET BY MOUTH  TWICE DAILY   [DISCONTINUED] potassium chloride SA (K-DUR) 20 MEQ tablet Take 1 tablet (20 mEq total) by mouth 2 (two) times daily.   [DISCONTINUED] buPROPion (WELLBUTRIN XL) 150 MG 24 hr tablet Take 1 tablet (150 mg total) by mouth daily.   [DISCONTINUED] HYDROcodone bit-homatropine (HYCODAN) 5-1.5 MG/5ML syrup Take 5 mLs by mouth every 8 (eight) hours as needed for cough.   No facility-administered medications prior to visit.    Review of Systems  Constitutional: Negative.   HENT: Negative.    Eyes: Negative.   Respiratory: Negative.    Cardiovascular: Negative.   Gastrointestinal: Negative.   Musculoskeletal:  Positive for back pain and joint pain.  Skin: Negative.   Neurological: Negative.   Endo/Heme/Allergies: Negative.   Psychiatric/Behavioral: Negative.    All other systems reviewed and are negative.         Objective:     BP 130/70   Pulse 75   Temp 97.8 F (36.6 C) (Oral)   Ht '4\' 10"'$  (1.473 m)   Wt 137 lb (62.1 kg)   SpO2 97%   BMI 28.63 kg/m    Physical Exam Vitals and nursing note reviewed.  Constitutional:      General: She is not in acute distress.    Appearance: Normal appearance. She is well-developed. She is not ill-appearing.   HENT:     Head: Normocephalic and atraumatic.     Right Ear: Tympanic membrane, ear canal and external ear normal. There is no impacted cerumen.     Left Ear: Tympanic membrane, ear canal and external ear normal. There is no impacted cerumen.     Nose: Nose normal. No congestion or rhinorrhea.     Mouth/Throat:     Mouth: Mucous membranes are moist.     Pharynx: Oropharynx is clear. No oropharyngeal exudate or posterior oropharyngeal erythema.  Eyes:     General:        Right eye: No discharge.        Left eye: No discharge.     Extraocular Movements: Extraocular movements intact.     Conjunctiva/sclera: Conjunctivae normal.  Pupils: Pupils are equal, round, and reactive to light.  Neck:     Thyroid: No thyromegaly.     Vascular: No carotid bruit.     Trachea: No tracheal deviation.  Cardiovascular:     Rate and Rhythm: Normal rate and regular rhythm.     Pulses: Normal pulses.     Heart sounds: Normal heart sounds. No murmur heard.    No friction rub. No gallop.  Pulmonary:     Effort: Pulmonary effort is normal. No respiratory distress.     Breath sounds: Normal breath sounds. No stridor. No wheezing, rhonchi or rales.  Chest:     Chest wall: No tenderness.  Abdominal:     General: Abdomen is flat. Bowel sounds are normal. There is no distension.     Palpations: Abdomen is soft. There is no mass.     Tenderness: There is no abdominal tenderness. There is no right CVA tenderness, left CVA tenderness, guarding or rebound.     Hernia: No hernia is present.  Musculoskeletal:        General: No swelling, tenderness, deformity or signs of injury. Normal range of motion.     Cervical back: Normal range of motion and neck supple.     Right lower leg: Edema present.     Left lower leg: Edema present.  Lymphadenopathy:     Cervical: No cervical adenopathy.  Skin:    General: Skin is warm and dry.     Capillary Refill: Capillary refill takes less than 2 seconds.      Coloration: Skin is not jaundiced or pale.     Findings: No bruising, erythema, lesion or rash.  Neurological:     General: No focal deficit present.     Mental Status: She is alert and oriented to person, place, and time.     Cranial Nerves: No cranial nerve deficit.     Sensory: No sensory deficit.     Motor: No weakness.     Coordination: Coordination normal.     Gait: Gait normal.     Deep Tendon Reflexes: Reflexes normal.  Psychiatric:        Mood and Affect: Mood normal.        Behavior: Behavior normal.        Thought Content: Thought content normal.        Judgment: Judgment normal.      No results found for any visits on 12/29/21.     Assessment & Plan:    Routine Health Maintenance and Physical Exam  Immunization History  Administered Date(s) Administered   Fluad Quad(high Dose 65+) 04/17/2019   Hep A / Hep B 11/22/2009, 12/20/2009   Influenza Split 03/30/2011, 04/12/2012   Influenza Whole 04/26/2007, 04/13/2009   Influenza, High Dose Seasonal PF 04/23/2015, 03/14/2017, 04/05/2018   Influenza, Seasonal, Injecte, Preservative Fre 04/29/2012   Influenza-Unspecified 04/18/2014, 04/16/2018   PFIZER(Purple Top)SARS-COV-2 Vaccination 08/07/2019, 08/28/2019, 05/11/2020, 03/30/2021   Pneumococcal Conjugate-13 06/30/2014   Pneumococcal Polysaccharide-23 07/18/2003, 02/23/2009, 11/07/2016   Td 07/17/2001   Tdap 05/16/2011   Zoster Recombinat (Shingrix) 04/06/2021, 07/12/2021   Zoster, Live 04/05/2010    Health Maintenance  Topic Date Due   FOOT EXAM  12/21/2021   URINE MICROALBUMIN  12/21/2021   TETANUS/TDAP  02/01/2022 (Originally 05/15/2021)   COVID-19 Vaccine (5 - Booster for [place] series) 03/08/2022 (Originally 05/25/2021)   OPHTHALMOLOGY EXAM  01/28/2022   INFLUENZA VACCINE  02/14/2022   HEMOGLOBIN A1C  05/03/2022   Pneumonia Vaccine 65+ Years  old  Completed   DEXA SCAN  Completed   Hepatitis C Screening  Completed   Zoster Vaccines- Shingrix  Completed    HPV VACCINES  Aged Out   COLONOSCOPY (Pts 45-30yr Insurance coverage will need to be confirmed)  Discontinued    Discussed health benefits of physical activity, and encouraged her to engage in regular exercise appropriate for her age and condition.  Problem List Items Addressed This Visit     CAD, NATIVE VESSEL   Relevant Medications   atorvastatin (LIPITOR) 80 MG tablet   furosemide (LASIX) 20 MG tablet   Other Relevant Orders   CBC with Differential/Platelet   Comprehensive metabolic panel   Hemoglobin A1c   Lipid panel   TSH   Chronic diastolic CHF (congestive heart failure) (HCC)   Relevant Medications   atorvastatin (LIPITOR) 80 MG tablet   furosemide (LASIX) 20 MG tablet   potassium chloride SA (KLOR-CON M) 20 MEQ tablet   Other Relevant Orders   CBC with Differential/Platelet   Comprehensive metabolic panel   Hemoglobin A1c   Lipid panel   TSH   Coronary artery disease - Primary   Relevant Medications   atorvastatin (LIPITOR) 80 MG tablet   furosemide (LASIX) 20 MG tablet   Other Relevant Orders   CBC with Differential/Platelet   Comprehensive metabolic panel   Hemoglobin A1c   Lipid panel   TSH   Diabetes mellitus without complication (HCC)   Relevant Medications   empagliflozin (JARDIANCE) 25 MG TABS tablet   atorvastatin (LIPITOR) 80 MG tablet   Other Relevant Orders   CBC with Differential/Platelet   Comprehensive metabolic panel   Hemoglobin A1c   Lipid panel   TSH   Microalbumin/Creatinine Ratio, Urine   Hyperlipidemia   Relevant Medications   atorvastatin (LIPITOR) 80 MG tablet   furosemide (LASIX) 20 MG tablet   Other Relevant Orders   CBC with Differential/Platelet   Comprehensive metabolic panel   Hemoglobin A1c   Lipid panel   TSH   Hypothyroidism   Relevant Orders   CBC with Differential/Platelet   Comprehensive metabolic panel   Hemoglobin A1c   Lipid panel   TSH   Insomnia, idiopathic   Relevant Medications   eszopiclone  (LUNESTA) 1 MG TABS tablet   Other Visit Diagnoses     Anxiety and depression       Relevant Medications   buPROPion (WELLBUTRIN XL) 300 MG 24 hr tablet      Return in about 3 months (around 03/31/2022).     CDorothyann Peng NP

## 2021-12-30 ENCOUNTER — Telehealth: Payer: Self-pay | Admitting: Adult Health

## 2021-12-30 DIAGNOSIS — E039 Hypothyroidism, unspecified: Secondary | ICD-10-CM

## 2021-12-30 MED ORDER — PIOGLITAZONE HCL 15 MG PO TABS: 15.0000 mg | ORAL_TABLET | Freq: Every day | ORAL | 0 refills | Status: DC

## 2021-12-30 MED ORDER — LEVOTHYROXINE SODIUM 50 MCG PO TABS: 50.0000 ug | ORAL_TABLET | Freq: Every day | ORAL | 3 refills | Status: DC

## 2021-12-30 NOTE — Telephone Encounter (Signed)
Updated patient on her labs. Her A1c has increased to 7.9. Since she cannot tolerate metformin or glipizide. Will place on low dose Actos   Follow up in 3 months

## 2022-01-04 ENCOUNTER — Other Ambulatory Visit: Payer: Self-pay | Admitting: Adult Health

## 2022-01-04 DIAGNOSIS — R058 Other specified cough: Secondary | ICD-10-CM

## 2022-01-04 DIAGNOSIS — Z96641 Presence of right artificial hip joint: Secondary | ICD-10-CM | POA: Diagnosis not present

## 2022-01-13 DIAGNOSIS — M25551 Pain in right hip: Secondary | ICD-10-CM | POA: Diagnosis not present

## 2022-01-13 DIAGNOSIS — R262 Difficulty in walking, not elsewhere classified: Secondary | ICD-10-CM | POA: Diagnosis not present

## 2022-01-18 DIAGNOSIS — R262 Difficulty in walking, not elsewhere classified: Secondary | ICD-10-CM | POA: Diagnosis not present

## 2022-01-18 DIAGNOSIS — M25551 Pain in right hip: Secondary | ICD-10-CM | POA: Diagnosis not present

## 2022-01-20 DIAGNOSIS — R262 Difficulty in walking, not elsewhere classified: Secondary | ICD-10-CM | POA: Diagnosis not present

## 2022-01-20 DIAGNOSIS — M25551 Pain in right hip: Secondary | ICD-10-CM | POA: Diagnosis not present

## 2022-01-25 DIAGNOSIS — R262 Difficulty in walking, not elsewhere classified: Secondary | ICD-10-CM | POA: Diagnosis not present

## 2022-01-25 DIAGNOSIS — M25551 Pain in right hip: Secondary | ICD-10-CM | POA: Diagnosis not present

## 2022-01-26 ENCOUNTER — Other Ambulatory Visit: Payer: Self-pay | Admitting: Adult Health

## 2022-01-26 DIAGNOSIS — F32A Depression, unspecified: Secondary | ICD-10-CM

## 2022-02-01 DIAGNOSIS — Z96641 Presence of right artificial hip joint: Secondary | ICD-10-CM | POA: Diagnosis not present

## 2022-02-03 DIAGNOSIS — R262 Difficulty in walking, not elsewhere classified: Secondary | ICD-10-CM | POA: Diagnosis not present

## 2022-02-03 DIAGNOSIS — M25551 Pain in right hip: Secondary | ICD-10-CM | POA: Diagnosis not present

## 2022-02-06 DIAGNOSIS — R262 Difficulty in walking, not elsewhere classified: Secondary | ICD-10-CM | POA: Diagnosis not present

## 2022-02-06 DIAGNOSIS — M25551 Pain in right hip: Secondary | ICD-10-CM | POA: Diagnosis not present

## 2022-02-13 DIAGNOSIS — R262 Difficulty in walking, not elsewhere classified: Secondary | ICD-10-CM | POA: Diagnosis not present

## 2022-02-13 DIAGNOSIS — M25551 Pain in right hip: Secondary | ICD-10-CM | POA: Diagnosis not present

## 2022-02-15 DIAGNOSIS — M25551 Pain in right hip: Secondary | ICD-10-CM | POA: Diagnosis not present

## 2022-02-15 DIAGNOSIS — R262 Difficulty in walking, not elsewhere classified: Secondary | ICD-10-CM | POA: Diagnosis not present

## 2022-02-20 DIAGNOSIS — M25551 Pain in right hip: Secondary | ICD-10-CM | POA: Diagnosis not present

## 2022-02-20 DIAGNOSIS — R262 Difficulty in walking, not elsewhere classified: Secondary | ICD-10-CM | POA: Diagnosis not present

## 2022-02-23 DIAGNOSIS — R262 Difficulty in walking, not elsewhere classified: Secondary | ICD-10-CM | POA: Diagnosis not present

## 2022-02-23 DIAGNOSIS — M25551 Pain in right hip: Secondary | ICD-10-CM | POA: Diagnosis not present

## 2022-02-27 DIAGNOSIS — M25551 Pain in right hip: Secondary | ICD-10-CM | POA: Diagnosis not present

## 2022-02-27 DIAGNOSIS — R262 Difficulty in walking, not elsewhere classified: Secondary | ICD-10-CM | POA: Diagnosis not present

## 2022-03-01 DIAGNOSIS — M25551 Pain in right hip: Secondary | ICD-10-CM | POA: Diagnosis not present

## 2022-03-01 DIAGNOSIS — R262 Difficulty in walking, not elsewhere classified: Secondary | ICD-10-CM | POA: Diagnosis not present

## 2022-03-02 ENCOUNTER — Other Ambulatory Visit: Payer: Self-pay | Admitting: Adult Health

## 2022-03-02 DIAGNOSIS — H5203 Hypermetropia, bilateral: Secondary | ICD-10-CM | POA: Diagnosis not present

## 2022-03-02 DIAGNOSIS — H04123 Dry eye syndrome of bilateral lacrimal glands: Secondary | ICD-10-CM | POA: Diagnosis not present

## 2022-03-02 DIAGNOSIS — E119 Type 2 diabetes mellitus without complications: Secondary | ICD-10-CM | POA: Diagnosis not present

## 2022-03-02 LAB — HM DIABETES EYE EXAM

## 2022-03-06 DIAGNOSIS — M25551 Pain in right hip: Secondary | ICD-10-CM | POA: Diagnosis not present

## 2022-03-06 DIAGNOSIS — R262 Difficulty in walking, not elsewhere classified: Secondary | ICD-10-CM | POA: Diagnosis not present

## 2022-03-07 ENCOUNTER — Encounter: Payer: Self-pay | Admitting: Adult Health

## 2022-03-08 DIAGNOSIS — M25551 Pain in right hip: Secondary | ICD-10-CM | POA: Diagnosis not present

## 2022-03-08 DIAGNOSIS — R262 Difficulty in walking, not elsewhere classified: Secondary | ICD-10-CM | POA: Diagnosis not present

## 2022-03-10 DIAGNOSIS — Z23 Encounter for immunization: Secondary | ICD-10-CM | POA: Diagnosis not present

## 2022-03-13 DIAGNOSIS — R262 Difficulty in walking, not elsewhere classified: Secondary | ICD-10-CM | POA: Diagnosis not present

## 2022-03-13 DIAGNOSIS — M25551 Pain in right hip: Secondary | ICD-10-CM | POA: Diagnosis not present

## 2022-03-15 ENCOUNTER — Encounter: Payer: Self-pay | Admitting: Adult Health

## 2022-03-15 DIAGNOSIS — Z96641 Presence of right artificial hip joint: Secondary | ICD-10-CM | POA: Diagnosis not present

## 2022-03-15 DIAGNOSIS — M25551 Pain in right hip: Secondary | ICD-10-CM | POA: Diagnosis not present

## 2022-03-16 DIAGNOSIS — R262 Difficulty in walking, not elsewhere classified: Secondary | ICD-10-CM | POA: Diagnosis not present

## 2022-03-16 DIAGNOSIS — M25551 Pain in right hip: Secondary | ICD-10-CM | POA: Diagnosis not present

## 2022-03-22 DIAGNOSIS — R262 Difficulty in walking, not elsewhere classified: Secondary | ICD-10-CM | POA: Diagnosis not present

## 2022-03-22 DIAGNOSIS — M25551 Pain in right hip: Secondary | ICD-10-CM | POA: Diagnosis not present

## 2022-03-23 DIAGNOSIS — M25551 Pain in right hip: Secondary | ICD-10-CM | POA: Diagnosis not present

## 2022-03-27 DIAGNOSIS — M25551 Pain in right hip: Secondary | ICD-10-CM | POA: Diagnosis not present

## 2022-03-27 DIAGNOSIS — R262 Difficulty in walking, not elsewhere classified: Secondary | ICD-10-CM | POA: Diagnosis not present

## 2022-03-29 DIAGNOSIS — R262 Difficulty in walking, not elsewhere classified: Secondary | ICD-10-CM | POA: Diagnosis not present

## 2022-03-29 DIAGNOSIS — M25551 Pain in right hip: Secondary | ICD-10-CM | POA: Diagnosis not present

## 2022-04-03 DIAGNOSIS — M25551 Pain in right hip: Secondary | ICD-10-CM | POA: Diagnosis not present

## 2022-04-03 DIAGNOSIS — R262 Difficulty in walking, not elsewhere classified: Secondary | ICD-10-CM | POA: Diagnosis not present

## 2022-04-10 ENCOUNTER — Telehealth: Payer: Self-pay | Admitting: Adult Health

## 2022-04-10 DIAGNOSIS — M25551 Pain in right hip: Secondary | ICD-10-CM | POA: Diagnosis not present

## 2022-04-10 DIAGNOSIS — R262 Difficulty in walking, not elsewhere classified: Secondary | ICD-10-CM | POA: Diagnosis not present

## 2022-04-10 NOTE — Telephone Encounter (Signed)
Left message for patient to call back and schedule Medicare Annual Wellness Visit (AWV) either virtually or in office. Left  my Herbie Drape number [phone]   Last AWV  04/20/21 ; please schedule at anytime with [facility] Nurse Health Advisor 1 or 2

## 2022-04-24 ENCOUNTER — Telehealth: Payer: Self-pay

## 2022-04-24 NOTE — Telephone Encounter (Signed)
Patient is ready to be scheduled for her Prolia injection, estimated cost is $0. I left a voicemail for her to call back to schedule her nurse visit.  Due on or after 05/03/2022.

## 2022-04-26 ENCOUNTER — Ambulatory Visit (INDEPENDENT_AMBULATORY_CARE_PROVIDER_SITE_OTHER): Payer: Medicare Other

## 2022-04-26 VITALS — Ht <= 58 in | Wt 138.0 lb

## 2022-04-26 DIAGNOSIS — M25551 Pain in right hip: Secondary | ICD-10-CM | POA: Diagnosis not present

## 2022-04-26 DIAGNOSIS — Z0000 Encounter for general adult medical examination without abnormal findings: Secondary | ICD-10-CM

## 2022-04-26 DIAGNOSIS — M5459 Other low back pain: Secondary | ICD-10-CM | POA: Diagnosis not present

## 2022-04-26 NOTE — Progress Notes (Signed)
Subjective:   Rebecca [name] is a 76 y.o. female who presents for Medicare Annual (Subsequent) preventive examination.  Review of Systems   Virtual Visit via Telephone Note  I connected with  Rebecca [name] on 04/26/22 at 10:30 AM EDT by telephone and verified that I am speaking with the correct person using two identifiers.  Location: Patient: Home Provider: Office Persons participating in the virtual visit: patient/Nurse Health Advisor   I discussed the limitations, risks, security and privacy concerns of performing an evaluation and management service by telephone and the availability of in person appointments. The patient expressed understanding and agreed to proceed.  Interactive audio and video telecommunications were attempted between this nurse and patient, however failed, due to patient having technical difficulties OR patient did not have access to video capability.  We continued and completed visit with audio only.  Some vital signs may be absent or patient reported.   Rebecca Peaches, LPN  Cardiac Risk Factors include: advanced age (>67mn, >>49women);diabetes mellitus;hypertension     Objective:    Today's Vitals   04/26/22 1035  Weight: 138 lb (62.6 kg)  Height: '4\' 10"'$  (1.473 m)   Body mass index is 28.84 kg/m.     04/26/2022   10:42 AM 04/20/2021    2:38 PM 03/29/2020   11:22 AM 11/12/2019    7:00 PM 11/10/2019    2:10 PM 11/03/2018    1:51 PM 09/04/2017    9:49 AM  Advanced Directives  Does Patient Have a Medical Advance Directive? Yes Yes Yes Yes Yes Yes Yes  Type of AParamedicof AJohnsonLiving will Healthcare Power of [place]Living will [place]Living will H[place] OakLiving will  H[place]Living will  Does patient want to make changes to medical advance directive?   No - Patient declined No - Patient declined No - Patient declined    Copy  of H[place]in Chart? No - copy requested No - copy requested Yes - validated most recent copy scanned in chart (See row information) Yes - validated most recent copy scanned in chart (See row information) Yes - validated most recent copy scanned in chart (See row information)      Current Medications (verified) Outpatient Encounter Medications as of 04/26/2022  Medication Sig   Accu-Chek FastClix Lancets MISC USE   TO CHECK GLUCOSE TWICE DAILY   ACCU-CHEK GUIDE test strip USE TO TEST BLOOD GLUCOSE TWICE DAILY   acetaminophen (TYLENOL) 325 MG tablet Take 650 mg by mouth every 6 (six) hours as needed for moderate pain.   albuterol (VENTOLIN HFA) 108 (90 Base) MCG/ACT inhaler Inhale 2 puffs into the lungs every 6 (six) hours as needed for wheezing or shortness of breath.   ALPRAZolam (XANAX) 0.5 MG tablet Take 0.5-1 tablets (0.25-0.5 mg total) by mouth daily. for sleep   atorvastatin (LIPITOR) 80 MG tablet Take 1 tablet (80 mg total) by mouth daily.   buPROPion (WELLBUTRIN XL) 300 MG 24 hr tablet TAKE 1 TABLET(300 MG) BY MOUTH DAILY   conjugated estrogens (PREMARIN) vaginal cream Place 0.5 Applicatorfuls vaginally every 7 (seven) days.   denosumab (PROLIA) 60 MG/ML SOSY injection Inject 60 mg into the skin every 6 (six) months.   Diclofenac Sodium 1 % CREA Apply topically.   diltiazem (CARDIZEM CD) 240 MG 24 hr capsule TAKE 1 CAPSULE(240 MG) BY MOUTH DAILY   empagliflozin (JARDIANCE) 25 MG TABS tablet Take  1 tablet (25 mg total) by mouth daily before breakfast.   eszopiclone (LUNESTA) 1 MG TABS tablet Take 1 tablet (1 mg total) by mouth at bedtime. Take immediately before bedtime   furosemide (LASIX) 20 MG tablet Take 1 tablet (20 mg total) by mouth 2 (two) times daily.   levothyroxine (SYNTHROID) 50 MCG tablet Take 1 tablet (50 mcg total) by mouth daily before breakfast.   nitroGLYCERIN (NITROSTAT) 0.4 MG SL tablet DISSOLVE 1 TABLET UNDER THE TONGUE EVERY 5 MINUTES  AS&nbsp;&nbsp;NEEDED FOR CHEST PAIN. MAX&nbsp;&nbsp;3 DOSES IN 15 MINUTES. CALL 911 IF CHEST PAIN PERSISTS   pioglitazone (ACTOS) 15 MG tablet TAKE 1 TABLET BY MOUTH DAILY   potassium chloride SA (KLOR-CON M) 20 MEQ tablet Take 1 tablet (20 mEq total) by mouth 2 (two) times daily.   spironolactone (ALDACTONE) 25 MG tablet Take 0.5 tablets (12.5 mg total) by mouth daily.   No facility-administered encounter medications on file as of 04/26/2022.    Allergies (verified) Sulfa antibiotics and Sulfonamide derivatives   History: Past Medical History:  Diagnosis Date   Asthma    extrinsic   CAD (coronary artery disease)    Chest pain, atypical    Complication of anesthesia    Diabetes ([place])    Edema    Fatigue    Fatigue    GERD (gastroesophageal reflux disease)    Hyperlipidemia    Hypertension    familiar   Hypothyroidism    IBS (irritable bowel syndrome)    hx   Keratosis, seborrheic    Labyrinthitis    MI (myocardial infarction) ([place])    age 56   Osteoarthritis    PONV (postoperative nausea and vomiting)    S/P arthroscopy of right shoulder 03/01/2018   Skin cancer    Melanoma    Past Surgical History:  Procedure Laterality Date   Breast Biopsy     dense breast tissue   BREAST EXCISIONAL BIOPSY Left 2000   benign   CHOLECYSTECTOMY     EUS  04/04/2012   Procedure: UPPER ENDOSCOPIC ULTRASOUND (EUS) LINEAR;  Surgeon: Milus Banister, MD;  Location: [facility];  Service: Endoscopy;  Laterality: N/A;  radial linear   KNEE ARTHROPLASTY     total right  02-28-2010 Dr Gaynelle Arabian, left   KNEE ARTHROPLASTY Bilateral 09/12/2017   Rebecca [name]   LEFT HEART CATH AND CORONARY ANGIOGRAPHY N/A 04/20/2017   Procedure: LEFT HEART CATH AND CORONARY ANGIOGRAPHY;  Surgeon: Burnell Blanks, MD;  Location: [facility];  Service: Cardiovascular;  Laterality: N/A;   TOTAL HIP ARTHROPLASTY Right 11/12/2019   Procedure: TOTAL HIP ARTHROPLASTY ANTERIOR APPROACH;  Surgeon:  Gaynelle Arabian, MD;  Location: [facility];  Service: Orthopedics;  Laterality: Right;  140mn   TUBAL LIGATION     Family History  Problem Relation Age of Onset   Heart disease Mother    Hypertension Mother    Heart attack Mother 457      MULTIPLES OVER TIME   Melanoma Daughter 238      METASTATIC   Breast cancer Sister    Hypertension Father    Hypertension Sister    Social History   Socioeconomic History   Marital status: Married    Spouse name: Not on file   Number of children: 2   Years of education: Not on file   Highest education level: Not on file  Occupational History   Occupation: RN-retired    Employer: RETIRED  Tobacco Use  Smoking status: Never   Smokeless tobacco: Never  Vaping Use   Vaping Use: Never used  Substance and Sexual Activity   Alcohol use: No    Comment: rarely   Drug use: No   Sexual activity: Not on file  Other Topics Concern   Not on file  Social History Narrative   ** Merged History Encounter **       Social Determinants of Health   Financial Resource Strain: Low Risk  (04/26/2022)   Overall Financial Resource Strain (CARDIA)    Difficulty of Paying Living Expenses: Not hard at all  Food Insecurity: No Food Insecurity (04/26/2022)   Hunger Vital Sign    Worried About Running Out of Food in the Last Year: Never true    Ran Out of Food in the Last Year: Never true  Transportation Needs: No Transportation Needs (04/26/2022)   PRAPARE - Hydrologist (Medical): No    Lack of Transportation (Non-Medical): No  Physical Activity: Insufficiently Active (04/26/2022)   Exercise Vital Sign    Days of Exercise per Week: 2 days    Minutes of Exercise per Session: 60 min  Stress: No Stress Concern Present (04/26/2022)   [place]    Feeling of Stress : Not at all  Social Connections: [place] (04/26/2022)   Social Connection and Isolation  Panel [NHANES]    Frequency of Communication with Friends and Family: More than three times a week    Frequency of Social Gatherings with Friends and Family: More than three times a week    Attends Religious Services: More than 4 times per year    Active Member of [employer] or Organizations: Yes    Attends Music therapist: More than 4 times per year    Marital Status: Married    Tobacco Counseling Counseling given: Not Answered   Clinical Intake:  Pre-visit preparation completed: No Nutrition Risk Assessment:  Has the patient had any N/V/D within the last 2 months?  No  Does the patient have any non-healing wounds?  No  Has the patient had any unintentional weight loss or weight gain?  No   Diabetes:  Is the patient diabetic?  Yes  If diabetic, was a CBG obtained today?  Yes CBG 142 Taken by patient Did the patient bring in their glucometer from home?  No  How often do you monitor your CBG's? Daily and PRN.   Financial Strains and Diabetes Management:  Are you having any financial strains with the device, your supplies or your medication? No .  Does the patient want to be seen by Chronic Care Management for management of their diabetes?  No  Would the patient like to be referred to a Nutritionist or for Diabetic Management?  No   Diabetic Exams:  Diabetic Eye Exam: Completed No. Overdue for diabetic eye exam. Pt has been advised about the importance in completing this exam. A referral has been placed today. Message sent to referral coordinator for scheduling purposes. Advised pt to expect a call from office referred to regarding appt.  Diabetic Foot Exam: Completed No. Pt has been advised about the importance in completing this exam. Pt is scheduled for diabetic foot exam on Followed by PCP.   Pain : No/denies pain     BMI - recorded: 28.84 Nutritional Status: BMI 25 -29 Overweight Nutritional Risks: None Diabetes: Yes CBG done?: Yes CBG resulted in Enter/  Edit results?:  Yes (CBG 142 Taken by patient) Did pt. bring in CBG monitor from home?: No  How often do you need to have someone help you when you read instructions, pamphlets, or other written materials from your doctor or pharmacy?: 1 - Never  Diabetic?  Yes  Interpreter Needed?: No  Information entered by :: Rolene Arbour LPN   Activities of Daily Living    04/26/2022   10:41 AM  In your present state of health, do you have any difficulty performing the following activities:  Hearing? 0  Vision? 0  Difficulty concentrating or making decisions? 0  Walking or climbing stairs? 0  Dressing or bathing? 0  Doing errands, shopping? 0  Preparing Food and eating ? N  Using the Toilet? N  In the past six months, have you accidently leaked urine? N  Do you have problems with loss of bowel control? N  Managing your Medications? N  Managing your Finances? N  Housekeeping or managing your Housekeeping? N    Patient Care Team: [name], NP as PCP - General (Family Medicine) Dorothy Spark, MD as PCP - Cardiology (Cardiology) Florinda Marker as Physician Assistant (Physician Assistant) Sydnee Cabal, MD as Consulting Physician (Orthopedic Surgery) Gaynelle Arabian, MD as Consulting Physician (Orthopedic Surgery) Murrell Converse as Physician Assistant (Cardiology)  Indicate any recent Medical Services you may have received from other than [facility] providers in the past year (date may be approximate).     Assessment:   This is a routine wellness examination for Keylin.  Hearing/Vision screen Hearing Screening - Comments:: Denies hearing difficulties   Vision Screening - Comments:: Wears rx glasses - up to date with routine eye exams with  Dr Twanna Hy  Dietary issues and exercise activities discussed: Current Exercise Habits: Home exercise routine, Type of exercise: walking, Time (Minutes): > 60, Frequency (Times/Week): 2, Weekly Exercise (Minutes/Week): 0,  Intensity: Moderate, Exercise limited by: None identified   Goals Addressed               This Visit's Progress     Patient Stated (pt-stated)        Continue to stay healthy.       Depression Screen    04/26/2022   10:41 AM 11/01/2021    9:43 AM 09/28/2021    3:26 PM 09/28/2021    2:26 PM 04/20/2021    2:37 PM 03/29/2020   11:24 AM 12/19/2019    8:48 AM  PHQ 2/9 Scores  PHQ - 2 Score 0 '5 5 5 1 '$ 0 0  PHQ- 9 Score  '21 18 18  '$ 0     Fall Risk    04/26/2022   10:42 AM 04/20/2021    2:39 PM 03/29/2020   11:23 AM 12/19/2019    8:48 AM 02/12/2019    6:24 PM  Fall Risk   Falls in the past year? 0 0 0 0 0  Comment     Emmi Telephone Survey: data to providers prior to load  Number falls in past yr: 0 0 0    Injury with Fall? 0 0 0    Risk for fall due to : No Fall Risks Impaired vision Medication side effect    Follow up Falls prevention discussed Falls prevention discussed       FALL RISK PREVENTION PERTAINING TO THE HOME:  Any stairs in or around the home? Yes  If so, are there any without handrails? No  Home free of loose throw rugs in walkways,  pet beds, electrical cords, etc? Yes  Adequate lighting in your home to reduce risk of falls? Yes   ASSISTIVE DEVICES UTILIZED TO PREVENT FALLS:  Life alert? Yes  Use of a cane, walker or w/c? Yes  Grab bars in the bathroom? Yes  Shower chair or bench in shower? Yes  Elevated toilet seat or a handicapped toilet? Yes   TIMED UP AND GO:  Was the test performed? No . Audio Visit  Cognitive Function:        04/26/2022   10:42 AM 04/20/2021    2:42 PM 03/29/2020   11:27 AM  6CIT Screen  What Year? 0 points 0 points 0 points  What month? 0 points 0 points 0 points  What time? 0 points 0 points 0 points  Count back from 20 0 points 0 points 0 points  Months in reverse 0 points 0 points 0 points  Repeat phrase 0 points 0 points 0 points  Total Score 0 points 0 points 0 points    Immunizations Immunization History   Administered Date(s) Administered   Fluad Quad(high Dose 65+) 04/17/2019, 03/30/2021   Hep A / Hep B 11/22/2009, 12/20/2009   Influenza Split 03/30/2011, 04/12/2012   Influenza Whole 04/26/2007, 04/13/2009   Influenza, High Dose Seasonal PF 04/23/2015, 03/14/2017, 04/05/2018   Influenza, Seasonal, Injecte, Preservative Fre 04/29/2012   Influenza-Unspecified 04/18/2014, 04/16/2018   PFIZER(Purple Top)SARS-COV-2 Vaccination 08/07/2019, 08/28/2019, 05/11/2020, 03/30/2021   Pneumococcal Conjugate-13 06/30/2014   Pneumococcal Polysaccharide-23 07/18/2003, 02/23/2009, 11/07/2016   Td 07/17/2001   Tdap 05/16/2011   Zoster Recombinat (Shingrix) 04/06/2021, 07/12/2021   Zoster, Live 04/05/2010    TDAP status: Up to date  Flu Vaccine status: Up to date  Pneumococcal vaccine status: Up to date  Covid-19 vaccine status: Completed vaccines  Qualifies for Shingles Vaccine? Yes   Zostavax completed Yes   Shingrix Completed?: Yes  Screening Tests Health Maintenance  Topic Date Due   FOOT EXAM  12/21/2021   COVID-19 Vaccine (5 - Pfizer risk series) 05/12/2022 (Originally 05/25/2021)   INFLUENZA VACCINE  10/15/2022 (Originally 02/14/2022)   TETANUS/TDAP  04/27/2023 (Originally 05/15/2021)   HEMOGLOBIN A1C  06/30/2022   Diabetic kidney evaluation - GFR measurement  12/30/2022   Diabetic kidney evaluation - Urine ACR  12/30/2022   OPHTHALMOLOGY EXAM  03/03/2023   Pneumonia Vaccine 47+ Years old  Completed   DEXA SCAN  Completed   Hepatitis C Screening  Completed   Zoster Vaccines- Shingrix  Completed   HPV VACCINES  Aged Out   COLONOSCOPY (Pts 45-35yr Insurance coverage will need to be confirmed)  Discontinued    Health Maintenance  Health Maintenance Due  Topic Date Due   FOOT EXAM  12/21/2021    Colorectal cancer screening: No longer required.   Mammogram status: No longer required due to Age.  Bone Density status: Completed 05/02/21. Results reflect: Bone density results:  OSTEOPOROSIS. Repeat every   years.  Lung Cancer Screening: (Low Dose CT Chest recommended if Age 78-80years, 30 pack-year currently smoking OR have quit w/in 15years.) does not qualify.     Additional Screening:  Hepatitis C Screening: does qualify; Completed 11/07/16  Vision Screening: Recommended annual ophthalmology exams for early detection of glaucoma and other disorders of the eye. Is the patient up to date with their annual eye exam?  Yes  Who is the provider or what is the name of the office in which the patient attends annual eye exams? Dr BTwanna HyIf pt is not established with  a provider, would they like to be referred to a provider to establish care? No .   Dental Screening: Recommended annual dental exams for proper oral hygiene  Community Resource Referral / Chronic Care Management:  CRR required this visit?  No   CCM required this visit?  No      Plan:     I have personally reviewed and noted the following in the patient's chart:   Medical and social history Use of alcohol, tobacco or illicit drugs  Current medications and supplements including opioid prescriptions. Patient is not currently taking opioid prescriptions. Functional ability and status Nutritional status Physical activity Advanced directives List of other physicians Hospitalizations, surgeries, and ER visits in previous 12 months Vitals Screenings to include cognitive, depression, and falls Referrals and appointments  In addition, I have reviewed and discussed with patient certain preventive protocols, quality metrics, and best practice recommendations. A written personalized care plan for preventive services as well as general preventive health recommendations were provided to patient.     Rebecca Peaches, LPN   [id number]   Nurse Notes: None

## 2022-04-26 NOTE — Patient Instructions (Addendum)
Rebecca [name] , Thank you for taking time to come for your Medicare Wellness Visit. I appreciate your ongoing commitment to your health goals. Please review the following plan we discussed and let me know if I can assist you in the future.   These are the goals we discussed:  Goals       Patient Stated      I want to get back active       Patient Stated (pt-stated)      Continue to stay healthy.      Weight (lb) < 130 lb (59 kg)        This is a list of the screening recommended for you and due dates:  Health Maintenance  Topic Date Due   Complete foot exam   12/21/2021   COVID-19 Vaccine (5 - Pfizer risk series) 05/12/2022*   Flu Shot  10/15/2022*   Tetanus Vaccine  04/27/2023*   Hemoglobin A1C  06/30/2022   Yearly kidney function blood test for diabetes  12/30/2022   Yearly kidney health urinalysis for diabetes  12/30/2022   Eye exam for diabetics  03/03/2023   Pneumonia Vaccine  Completed   DEXA scan (bone density measurement)  Completed   Hepatitis C Screening: USPSTF Recommendation to screen - Ages 62-79 yo.  Completed   Zoster (Shingles) Vaccine  Completed   HPV Vaccine  Aged Out   Colon Cancer Screening  Discontinued  *Topic was postponed. The date shown is not the original due date.    Advanced directives: Please bring a copy of your health care power of attorney and living will to the office to be added to your chart at your convenience.   Conditions/risks identified: None  Next appointment: Follow up in one year for your annual wellness visit     Preventive Care 65 Years and Older, Female Preventive care refers to lifestyle choices and visits with your health care provider that can promote health and wellness. What does preventive care include? A yearly physical exam. This is also called an annual well check. Dental exams once or twice a year. Routine eye exams. Ask your health care provider how often you should have your eyes checked. Personal lifestyle  choices, including: Daily care of your teeth and gums. Regular physical activity. Eating a healthy diet. Avoiding tobacco and drug use. Limiting alcohol use. Practicing safe sex. Taking low-dose aspirin every day. Taking vitamin and mineral supplements as recommended by your health care provider. What happens during an annual well check? The services and screenings done by your health care provider during your annual well check will depend on your age, overall health, lifestyle risk factors, and family history of disease. Counseling  Your health care provider may ask you questions about your: Alcohol use. Tobacco use. Drug use. Emotional well-being. Home and relationship well-being. Sexual activity. Eating habits. History of falls. Memory and ability to understand (cognition). Work and work Statistician. Reproductive health. Screening  You may have the following tests or measurements: Height, weight, and BMI. Blood pressure. Lipid and cholesterol levels. These may be checked every 5 years, or more frequently if you are over 9 years old. Skin check. Lung cancer screening. You may have this screening every year starting at age 80 if you have a 30-pack-year history of smoking and currently smoke or have quit within the past 15 years. Fecal occult blood test (FOBT) of the stool. You may have this test every year starting at age 31. Flexible sigmoidoscopy or colonoscopy. You  may have a sigmoidoscopy every 5 years or a colonoscopy every 10 years starting at age 8. Hepatitis C blood test. Hepatitis B blood test. Sexually transmitted disease (STD) testing. Diabetes screening. This is done by checking your blood sugar (glucose) after you have not eaten for a while (fasting). You may have this done every 1-3 years. Bone density scan. This is done to screen for osteoporosis. You may have this done starting at age 40. Mammogram. This may be done every 1-2 years. Talk to your health care  provider about how often you should have regular mammograms. Talk with your health care provider about your test results, treatment options, and if necessary, the need for more tests. Vaccines  Your health care provider may recommend certain vaccines, such as: Influenza vaccine. This is recommended every year. Tetanus, diphtheria, and acellular pertussis (Tdap, Td) vaccine. You may need a Td booster every 10 years. Zoster vaccine. You may need this after age 54. Pneumococcal 13-valent conjugate (PCV13) vaccine. One dose is recommended after age 64. Pneumococcal polysaccharide (PPSV23) vaccine. One dose is recommended after age 53. Talk to your health care provider about which screenings and vaccines you need and how often you need them. This information is not intended to replace advice given to you by your health care provider. Make sure you discuss any questions you have with your health care provider. Document Released: 07/30/2015 Document Revised: 03/22/2016 Document Reviewed: 05/04/2015 Elsevier Interactive Patient Education  2017 [place] Prevention in the Home Falls can cause injuries. They can happen to people of all ages. There are many things you can do to make your home safe and to help prevent falls. What can I do on the outside of my home? Regularly fix the edges of walkways and driveways and fix any cracks. Remove anything that might make you trip as you walk through a door, such as a raised step or threshold. Trim any bushes or trees on the path to your home. Use bright outdoor lighting. Clear any walking paths of anything that might make someone trip, such as rocks or tools. Regularly check to see if handrails are loose or broken. Make sure that both sides of any steps have handrails. Any raised decks and porches should have guardrails on the edges. Have any leaves, snow, or ice cleared regularly. Use sand or salt on walking paths during winter. Clean up any  spills in your garage right away. This includes oil or grease spills. What can I do in the bathroom? Use night lights. Install grab bars by the toilet and in the tub and shower. Do not use towel bars as grab bars. Use non-skid mats or decals in the tub or shower. If you need to sit down in the shower, use a plastic, non-slip stool. Keep the floor dry. Clean up any water that spills on the floor as soon as it happens. Remove soap buildup in the tub or shower regularly. Attach bath mats securely with double-sided non-slip rug tape. Do not have throw rugs and other things on the floor that can make you trip. What can I do in the bedroom? Use night lights. Make sure that you have a light by your bed that is easy to reach. Do not use any sheets or blankets that are too big for your bed. They should not hang down onto the floor. Have a firm chair that has side arms. You can use this for support while you get dressed. Do not have throw  rugs and other things on the floor that can make you trip. What can I do in the kitchen? Clean up any spills right away. Avoid walking on wet floors. Keep items that you use a lot in easy-to-reach places. If you need to reach something above you, use a strong step stool that has a grab bar. Keep electrical cords out of the way. Do not use floor polish or wax that makes floors slippery. If you must use wax, use non-skid floor wax. Do not have throw rugs and other things on the floor that can make you trip. What can I do with my stairs? Do not leave any items on the stairs. Make sure that there are handrails on both sides of the stairs and use them. Fix handrails that are broken or loose. Make sure that handrails are as long as the stairways. Check any carpeting to make sure that it is firmly attached to the stairs. Fix any carpet that is loose or worn. Avoid having throw rugs at the top or bottom of the stairs. If you do have throw rugs, attach them to the floor  with carpet tape. Make sure that you have a light switch at the top of the stairs and the bottom of the stairs. If you do not have them, ask someone to add them for you. What else can I do to help prevent falls? Wear shoes that: Do not have high heels. Have rubber bottoms. Are comfortable and fit you well. Are closed at the toe. Do not wear sandals. If you use a stepladder: Make sure that it is fully opened. Do not climb a closed stepladder. Make sure that both sides of the stepladder are locked into place. Ask someone to hold it for you, if possible. Clearly mark and make sure that you can see: Any grab bars or handrails. First and last steps. Where the edge of each step is. Use tools that help you move around (mobility aids) if they are needed. These include: Canes. Walkers. Scooters. Crutches. Turn on the lights when you go into a dark area. Replace any light bulbs as soon as they burn out. Set up your furniture so you have a clear path. Avoid moving your furniture around. If any of your floors are uneven, fix them. If there are any pets around you, be aware of where they are. Review your medicines with your doctor. Some medicines can make you feel dizzy. This can increase your chance of falling. Ask your doctor what other things that you can do to help prevent falls. This information is not intended to replace advice given to you by your health care provider. Make sure you discuss any questions you have with your health care provider. Document Released: 04/29/2009 Document Revised: 12/09/2015 Document Reviewed: 08/07/2014 Elsevier Interactive Patient Education  2017 [employer].

## 2022-04-30 NOTE — Progress Notes (Unsigned)
Cardiology Office Note:    Date:  04/30/2022   ID:  [name], DOB November 10, 1945, MRN [id number]  PCP:  Rebecca Peng, NP   [facility] Providers Cardiologist:  Ena Dawley, MD Cardiology APP:  Rebecca Burn, PA-C  {   Referring MD: Rebecca Peng, NP    History of Present Illness:    Rebecca [name] is a 76 y.o. female with a hx of of CAD status post PCI to the RCA in 2010,  hyperlipidemia, HTN, chronic diastolic CHF who presents to clinic for follow-up. Was previously followed by by Dr. Meda [name].  Per review of the record, patient has known CAD with RCA PCI in 2010. Repeat cath in 04/2017 with mild disease, patent RCA stent. Recommended for medical management. TTE 10/2020 with normal EF 60-65%, G1DD, RV okay, trivial MR, RAP 63mHg.   Per report, the patient states that at 76years old, she was working at a health fair with 2 nurses and notices she had to hold herself up against the table. She remembers feeling poorly and drove herself home and believes she lost consciousness in her bed. In the following 3 days, she went to work and continued to feel poorly. A doctor at her workplace gave her nitroglycerin and morphine and rushed her to the ER. She was found to have complete occlusion of the R coronary artery but was unable to undergo surgery at the time. Had subsequent PCI to RCA in 2010.  Was last seen in clinic on 10/2021 where she was doing well from a CV standpoint.  Today, ***  Past Medical History:  Diagnosis Date   Asthma    extrinsic   CAD (coronary artery disease)    Chest pain, atypical    Complication of anesthesia    [place]    Edema    Fatigue    Fatigue    GERD (gastroesophageal reflux disease)    Hyperlipidemia    Hypertension    familiar   Hypothyroidism    IBS (irritable bowel syndrome)    hx   Keratosis, seborrheic    Labyrinthitis    [place]    age 76  Osteoarthritis    PONV (postoperative nausea and  vomiting)    S/P arthroscopy of right shoulder 03/01/2018   Skin cancer    Melanoma     Past Surgical History:  Procedure Laterality Date   Breast Biopsy     dense breast tissue   BREAST EXCISIONAL BIOPSY Left 2000   benign   CHOLECYSTECTOMY     EUS  04/04/2012   Procedure: UPPER ENDOSCOPIC ULTRASOUND (EUS) LINEAR;  Surgeon: DMilus Banister MD;  Location: [facility];  Service: Endoscopy;  Laterality: N/A;  radial linear   KNEE ARTHROPLASTY     total right  02-28-2010 Dr FGaynelle Arabian left   KNEE ARTHROPLASTY Bilateral 09/12/2017   FHector Shade  LEFT HEART CATH AND CORONARY ANGIOGRAPHY N/A 04/20/2017   Procedure: LEFT HEART CATH AND CORONARY ANGIOGRAPHY;  Surgeon: MBurnell Blanks MD;  [facility];  Service: Cardiovascular;  Laterality: N/A;   TOTAL HIP ARTHROPLASTY Right 11/12/2019   Procedure: TOTAL HIP ARTHROPLASTY ANTERIOR APPROACH;  Surgeon: AGaynelle Arabian MD;  Location: [facility];  Service: Orthopedics;  Laterality: Right;  106m   TUBAL LIGATION      Current Medications: No outpatient medications have been marked as taking for the 05/04/22 encounter (Appointment) with PeFreada BergeronMD.  Allergies:   Sulfa antibiotics and Sulfonamide derivatives   Social History   Socioeconomic History   Marital status: Married    Spouse name: Not on file   Number of children: 2   Years of education: Not on file   Highest education level: Not on file  Occupational History   Occupation: RN-retired    Employer: RETIRED  Tobacco Use   Smoking status: Never   Smokeless tobacco: Never  Vaping Use   Vaping Use: Never used  Substance and Sexual Activity   Alcohol use: No    Comment: rarely   Drug use: No   Sexual activity: Not on file  Other Topics Concern   Not on file  Social History Narrative   ** Merged History Encounter **       Social Determinants of Health   Financial Resource Strain: Low Risk  (04/26/2022)   Overall Financial  Resource Strain (CARDIA)    Difficulty of Paying Living Expenses: Not hard at all  Food Insecurity: No Food Insecurity (04/26/2022)   Hunger Vital Sign    Worried About Running Out of Food in the Last Year: Never true    Ran Out of Food in the Last Year: Never true  Transportation Needs: No Transportation Needs (04/26/2022)   PRAPARE - Hydrologist (Medical): No    Lack of Transportation (Non-Medical): No  Physical Activity: Insufficiently Active (04/26/2022)   Exercise Vital Sign    Days of Exercise per Week: 2 days    Minutes of Exercise per Session: 60 min  Stress: No Stress Concern Present (04/26/2022)   New Home    Feeling of Stress : Not at all  Social Connections: Rebecca [name] (04/26/2022)   Social Connection and Isolation Panel [NHANES]    Frequency of Communication with Friends and Family: More than three times a week    Frequency of Social Gatherings with Friends and Family: More than three times a week    Attends Religious Services: More than 4 times per year    Active Member of [employer] or Organizations: Yes    Attends Music therapist: More than 4 times per year    Marital Status: Married     Family History: The patient's family history includes Breast cancer in her sister; Heart attack (age of onset: 61) in her mother; Heart disease in her mother; Hypertension in her father, mother, and sister; Melanoma (age of onset: 45) in her daughter.  ROS:   Please see the history of present illness.    Review of Systems  Constitutional:  Negative for malaise/fatigue and weight loss.  HENT:  Negative for congestion and sore throat.   Eyes:  Negative for blurred vision.  Respiratory:  Negative for cough and shortness of breath.   Cardiovascular:  Positive for palpitations and leg swelling. Negative for chest pain, orthopnea, claudication and PND.  Gastrointestinal:   Negative for heartburn and nausea.  Genitourinary:  Negative for frequency and urgency.  Musculoskeletal:  Negative for joint pain and myalgias.  Skin:  Negative for itching.  Neurological:  Negative for dizziness and headaches.  Endo/Heme/Allergies:  Does not bruise/bleed easily.  Psychiatric/Behavioral:  The patient is not nervous/anxious and does not have insomnia.    All other systems reviewed and are negative.  EKGs/Labs/Other Studies Reviewed:    The following studies were reviewed today: TTE November 07, 2020: IMPRESSIONS:  1. Left ventricular ejection fraction, by estimation, is 60 to  65%. Left  ventricular ejection fraction by 3D volume is 66 %. The left ventricle has  normal function. The left ventricle has no regional wall motion  abnormalities. Left ventricular diastolic   parameters are consistent with Grade I diastolic dysfunction (impaired  relaxation).   2. Right ventricular systolic function is normal. The right ventricular  size is normal.   3. The mitral valve is normal in structure. Trivial mitral valve  regurgitation. No evidence of mitral stenosis.   4. The aortic valve is tricuspid. Aortic valve regurgitation is not  visualized. No aortic stenosis is present.   5. The inferior vena cava is normal in size with greater than 50%  respiratory variability, suggesting right atrial pressure of 3 mmHg.   Cardiac Monitor 02/20: Sinus bradycardia to sinus tachycardia. Infrequent PACs, PVCs. Few very short SVT runs.   Sinus bradycardia to sinus tachycardia. Infrequent PACs, PVCs. Few very short SVT runs.  LHC 04/2017: Prox RCA to Mid RCA lesion, 0 %stenosed. Prox RCA lesion, 40 %stenosed. Ost LAD to Prox LAD lesion, 20 %stenosed. Prox LAD to Mid LAD lesion, 30 %stenosed. The left ventricular systolic function is normal. LV end diastolic pressure is normal. The left ventricular ejection fraction is greater than 65% by visual estimate. There is no mitral valve  regurgitation.   1. Single vessel CAD with patent stent RCA. The RCA stent has mild to moderate restenosis. This does not appear to be flow limiting.  2. Mild disease LAD 3. Normal LV systolic function   Recommendations: Continue medical management of CAD.     EKG: EKG was not ordered today  Recent Labs: 12/29/2021: ALT 23; BUN 18; Creatinine, Ser 0.90; Hemoglobin 14.4; Platelets 288.0; Potassium 3.9; Sodium 137; TSH 1.35  Recent Lipid Panel    Component Value Date/Time   CHOL 145 12/29/2021 1102   CHOL 155 10/13/2020 0901   TRIG 84.0 12/29/2021 1102   HDL 63.70 12/29/2021 1102   HDL 60 10/13/2020 0901   CHOLHDL 2 12/29/2021 1102   VLDL 16.8 12/29/2021 1102   LDLCALC 65 12/29/2021 1102   LDLCALC 78 10/13/2020 0901     Risk Assessment/Calculations:           Physical Exam:    VS:  There were no vitals taken for this visit.    Wt Readings from Last 3 Encounters:  04/26/22 138 lb (62.6 kg)  12/29/21 137 lb (62.1 kg)  12/14/21 140 lb (63.5 kg)     GEN: Well nourished, well developed in no acute distress HEENT: Normal NECK: No JVD; No carotid bruits CARDIAC: RRR, 1/6 systolic murmur, no rubs or gallops RESPIRATORY:  Clear to auscultation without rales, wheezing or rhonchi  ABDOMEN: Soft, non-tender, non-distended MUSCULOSKELETAL:  No edema; No deformity  SKIN: Warm and dry NEUROLOGIC:  Alert and oriented x 3 PSYCHIATRIC:  Normal affect   ASSESSMENT:    No diagnosis found.  PLAN:    In order of problems listed above:  #CAD s/p PCI to RCA: Underwent RCA PCI in 2010. Cath 04/2017 with nonobstructive CAD and patent RCA stent. TTE with normal BiV function. No current anginal symptoms. -Continue lipitor '80mg'$  daily -Continue ASA '81mg'$  daily  #Chronic Diastolic HF: TTE 60/1093 with normal BiV function, LVEF 60-65%, G1DD, no significant valve disease. Currently compensated with NYHA class II symptoms. -Continue lasix '20mg'$  BID -Continue farxiga '10mg'$   daily -Continue K+ 4mq daily and check BMET today -Continue spiro 12.'5mg'$  daily -Low Na diet  #Palpitations: 2-week monitor 09/04/2018 sinus bradycardia to  sinus tachycardia with infrequent PACs, PVCs and very short runs of SVT. Well controlled on dilt. -Continue dilt '240mg'$  daily  #DMII: -A1C 7.9 -Managed by PCP -On jardiance '25mg'$  daily     Medication Adjustments/Labs and Tests Ordered: Current medicines are reviewed at length with the patient today.  Concerns regarding medicines are outlined above.  No orders of the defined types were placed in this encounter.  No orders of the defined types were placed in this encounter.   There are no Patient Instructions on file for this visit.   I,Mykaella Javier,acting as a scribe for Freada Bergeron, MD.,have documented all relevant documentation on the behalf of Freada Bergeron, MD,as directed by  Freada Bergeron, MD while in the presence of Freada Bergeron, MD.  I, Freada Bergeron, MD, have reviewed all documentation for this visit. The documentation on 04/30/22 for the exam, diagnosis, procedures, and orders are all accurate and complete.   Signed, Freada Bergeron, MD  04/30/2022 2:45 PM    [facility]

## 2022-05-03 ENCOUNTER — Ambulatory Visit: Payer: Medicare Other

## 2022-05-04 ENCOUNTER — Encounter: Payer: Self-pay | Admitting: Cardiology

## 2022-05-04 ENCOUNTER — Ambulatory Visit: Payer: Medicare Other | Attending: Cardiology | Admitting: Cardiology

## 2022-05-04 VITALS — BP 120/68 | HR 63 | Ht <= 58 in | Wt 138.0 lb

## 2022-05-04 DIAGNOSIS — R609 Edema, unspecified: Secondary | ICD-10-CM | POA: Insufficient documentation

## 2022-05-04 DIAGNOSIS — I10 Essential (primary) hypertension: Secondary | ICD-10-CM

## 2022-05-04 DIAGNOSIS — I5033 Acute on chronic diastolic (congestive) heart failure: Secondary | ICD-10-CM | POA: Insufficient documentation

## 2022-05-04 DIAGNOSIS — E785 Hyperlipidemia, unspecified: Secondary | ICD-10-CM | POA: Diagnosis not present

## 2022-05-04 DIAGNOSIS — I2510 Atherosclerotic heart disease of native coronary artery without angina pectoris: Secondary | ICD-10-CM | POA: Diagnosis not present

## 2022-05-04 DIAGNOSIS — R002 Palpitations: Secondary | ICD-10-CM | POA: Diagnosis not present

## 2022-05-04 MED ORDER — FUROSEMIDE 20 MG PO TABS: ORAL_TABLET | ORAL | 0 refills | Status: DC

## 2022-05-04 MED ORDER — POTASSIUM CHLORIDE CRYS ER 20 MEQ PO TBCR: EXTENDED_RELEASE_TABLET | ORAL | 0 refills | Status: DC

## 2022-05-04 NOTE — Progress Notes (Signed)
Cardiology Office Note:    Date:  05/04/2022   ID:  Rebecca [name], DOB 1946-04-01, MRN [id number]  PCP:  [name], NP   [facility] Providers Cardiologist:  Ena Dawley, MD Cardiology APP:  [name], PA-C  {   Referring MD: [name], NP    History of Present Illness:    Rebecca [name] is a 76 y.o. female with a hx of of CAD status post PCI to the RCA in 2010,  hyperlipidemia, HTN, chronic diastolic CHF who presents to clinic for follow-up. Was previously followed by by Dr. [name].  Per review of the record, patient has known CAD with RCA PCI in 2010. Repeat cath in 04/2017 with mild disease, patent RCA stent. Recommended for medical management. TTE 10/2020 with normal EF 60-65%, G1DD, RV okay, trivial MR, RAP 33mHg.   Per report, the patient states that at 76years old, she was working at a health fair with 2 nurses and notices she had to hold herself up against the table. She remembers feeling poorly and drove herself home and believes she lost consciousness in her bed. In the following 3 days, she went to work and continued to feel poorly. A doctor at her workplace gave her nitroglycerin and morphine and rushed her to the ER. She was found to have complete occlusion of the R coronary artery but was unable to undergo surgery at the time. Had subsequent PCI to RCA in 2010.  Was last seen in clinic on 10/2021 where she was doing well from a CV standpoint.  Today, the patient states that she believes she is still retaining fluid. Her spironolactone does not seem to be effective; she has tried doubling up on the spironolactone with no significant changes.  She is also continuing her 20 mg Lasix 2x daily. The level of swelling she has currently is "a good day" for her. At night her swelling improves a little bit. If she does not elevate her legs, the edema becomes much worse. Typically she is conscientious of avoiding salty foods.  Additionally she complains of  severe pain in her RLE and right hip. Initially it was thought her body may be rejecting her hip replacement, but she reports this was ruled out last week. Her pain is significantly limiting her physical exercise. This is frustrating as she is trying to lose weight. She does well with following a healthy diet. Her diet consists of an organic vegetable smoothie with a tablespoon of peanut butter in the mornings. She also has salads frequently. Currently she is on Jardiance but notices fluctuating blood sugar levels.  At home her blood pressures have been well controlled.   She denies any palpitations, chest pain, or shortness of breath. No lightheadedness, headaches, syncope, orthopnea, or PND.   Past Medical History:  Diagnosis Date   Asthma    extrinsic   CAD (coronary artery disease)    Chest pain, atypical    Complication of anesthesia    Diabetes ([place]    Edema    Fatigue    Fatigue    GERD (gastroesophageal reflux disease)    Hyperlipidemia    Hypertension    familiar   Hypothyroidism    IBS (irritable bowel syndrome)    hx   Keratosis, seborrheic    Labyrinthitis    [place] 76  Osteoarthritis    PONV (postoperative nausea and vomiting)    S/P arthroscopy of right shoulder 03/01/2018  Skin cancer    Melanoma     Past Surgical History:  Procedure Laterality Date   Breast Biopsy     dense breast tissue   BREAST EXCISIONAL BIOPSY Left 2000   benign   CHOLECYSTECTOMY     EUS  04/04/2012   Procedure: UPPER ENDOSCOPIC ULTRASOUND (EUS) LINEAR;  Surgeon: Milus Banister, MD;  Location: [facility];  Service: Endoscopy;  Laterality: N/A;  radial linear   KNEE ARTHROPLASTY     total right  02-28-2010 Dr Gaynelle Arabian, left   KNEE ARTHROPLASTY Bilateral 09/12/2017   [facility]   LEFT HEART CATH AND CORONARY ANGIOGRAPHY N/A 04/20/2017   Procedure: LEFT HEART CATH AND CORONARY ANGIOGRAPHY;  Surgeon: Burnell Blanks, MD;  Location: [facility];  Service: Cardiovascular;  Laterality: N/A;   TOTAL HIP ARTHROPLASTY Right 11/12/2019   Procedure: TOTAL HIP ARTHROPLASTY ANTERIOR APPROACH;  Surgeon: Gaynelle Arabian, MD;  Location: [facility];  Service: Orthopedics;  Laterality: Right;  146mn   TUBAL LIGATION      Current Medications: Current Meds  Medication Sig   Accu-Chek FastClix Lancets MISC USE   TO CHECK GLUCOSE TWICE DAILY   ACCU-CHEK GUIDE test strip USE TO TEST BLOOD GLUCOSE TWICE DAILY   acetaminophen (TYLENOL) 325 MG tablet Take 650 mg by mouth every 6 (six) hours as needed for moderate pain.   albuterol (VENTOLIN HFA) 108 (90 Base) MCG/ACT inhaler Inhale 2 puffs into the lungs every 6 (six) hours as needed for wheezing or shortness of breath.   ALPRAZolam (XANAX) 0.5 MG tablet Take 0.5-1 tablets (0.25-0.5 mg total) by mouth daily. for sleep   atorvastatin (LIPITOR) 80 MG tablet Take 1 tablet (80 mg total) by mouth daily.   buPROPion (WELLBUTRIN XL) 300 MG 24 hr tablet TAKE 1 TABLET(300 MG) BY MOUTH DAILY   conjugated estrogens (PREMARIN) vaginal cream Place 0.5 Applicatorfuls vaginally every 7 (seven) days.   denosumab (PROLIA) 60 MG/ML SOSY injection Inject 60 mg into the skin every 6 (six) months.   Diclofenac Sodium 1 % CREA Apply topically.   diltiazem (CARDIZEM CD) 240 MG 24 hr capsule TAKE 1 CAPSULE(240 MG) BY MOUTH DAILY   empagliflozin (JARDIANCE) 25 MG TABS tablet Take 1 tablet (25 mg total) by mouth daily before breakfast.   furosemide (LASIX) 20 MG tablet Take 2 tablets (40 mg total) by mouth in the AM and then take 1 tablet (20 mg total) by mouth in the PM for 3 days only, then decrease to taking 1 tablet (20 mg total) by mouth twice daily thereafter.   levothyroxine (SYNTHROID) 50 MCG tablet Take 1 tablet (50 mcg total) by mouth daily before breakfast.   nitroGLYCERIN (NITROSTAT) 0.4 MG SL tablet DISSOLVE 1 TABLET UNDER THE TONGUE EVERY 5 MINUTES AS&nbsp;&nbsp;NEEDED FOR CHEST PAIN. MAX&nbsp;&nbsp;3  DOSES IN 15 MINUTES. CALL 911 IF CHEST PAIN PERSISTS   pioglitazone (ACTOS) 15 MG tablet TAKE 1 TABLET BY MOUTH DAILY   potassium chloride SA (KLOR-CON M) 20 MEQ tablet Take 2 tablets (40 mEq total) by mouth in the AM and then take 1 tablet (20 mEq total) by mouth in the PM for 3 days only, then decrease to taking 2 tablets (40 mEq total) by mouth daily thereafter.   spironolactone (ALDACTONE) 25 MG tablet Take 0.5 tablets (12.5 mg total) by mouth daily.   [DISCONTINUED] furosemide (LASIX) 20 MG tablet Take 1 tablet (20 mg total) by mouth 2 (two) times daily.   [DISCONTINUED] potassium chloride SA (KLOR-CON M)  20 MEQ tablet Take 1 tablet (20 mEq total) by mouth 2 (two) times daily.     Allergies:   Sulfa antibiotics and Sulfonamide derivatives   Social History   Socioeconomic History   Marital status: Married    Spouse name: Not on file   Number of children: 2   Years of education: Not on file   Highest education level: Not on file  Occupational History   Occupation: RN-retired    Employer: RETIRED  Tobacco Use   Smoking status: Never   Smokeless tobacco: Never  Vaping Use   Vaping Use: Never used  Substance and Sexual Activity   Alcohol use: No    Comment: rarely   Drug use: No   Sexual activity: Not on file  Other Topics Concern   Not on file  Social History Narrative   ** Merged History Encounter **       Social Determinants of Health   Financial Resource Strain: Low Risk  (04/26/2022)   Overall Financial Resource Strain (CARDIA)    Difficulty of Paying Living Expenses: Not hard at all  Food Insecurity: No Food Insecurity (04/26/2022)   Hunger Vital Sign    Worried About Running Out of Food in the Last Year: Never true    Ran Out of Food in the Last Year: Never true  Transportation Needs: No Transportation Needs (04/26/2022)   PRAPARE - Hydrologist (Medical): No    Lack of Transportation (Non-Medical): No  Physical Activity:  Insufficiently Active (04/26/2022)   Exercise Vital Sign    Days of Exercise per Week: 2 days    Minutes of Exercise per Session: 60 min  Stress: No Stress Concern Present (04/26/2022)   [place]    Feeling of Stress : Not at all  Social Connections: [place] (04/26/2022)   Social Connection and Isolation Panel [NHANES]    Frequency of Communication with Friends and Family: More than three times a week    Frequency of Social Gatherings with Friends and Family: More than three times a week    Attends Religious Services: More than 4 times per year    Active Member of [employer] or Organizations: Yes    Attends Music therapist: More than 4 times per year    Marital Status: Married     Family History: The patient's family history includes Breast cancer in her sister; Heart attack (age of onset: 45) in her mother; Heart disease in her mother; Hypertension in her father, mother, and sister; Melanoma (age of onset: 22) in her daughter.  ROS:   Please see the history of present illness.    Review of Systems  Constitutional:  Negative for malaise/fatigue and weight loss.  HENT:  Negative for congestion and sore throat.   Eyes:  Negative for blurred vision.  Respiratory:  Negative for cough and sputum production.   Cardiovascular:  Positive for leg swelling. Negative for chest pain, palpitations, orthopnea, claudication and PND.  Gastrointestinal:  Negative for heartburn and nausea.  Genitourinary:  Negative for frequency and urgency.  Musculoskeletal:  Positive for joint pain (Right hip) and myalgias (RLE).  Skin:  Negative for itching.  Neurological:  Negative for dizziness and headaches.  Endo/Heme/Allergies:  Does not bruise/bleed easily.  Psychiatric/Behavioral:  The patient is not nervous/anxious and does not have insomnia.    All other systems reviewed and are negative.  EKGs/Labs/Other Studies  Reviewed:  The following studies were reviewed today:  TTE 10/2020: IMPRESSIONS:  1. Left ventricular ejection fraction, by estimation, is 60 to 65%. Left  ventricular ejection fraction by 3D volume is 66 %. The left ventricle has  normal function. The left ventricle has no regional wall motion  abnormalities. Left ventricular diastolic   parameters are consistent with Grade I diastolic dysfunction (impaired  relaxation).   2. Right ventricular systolic function is normal. The right ventricular  size is normal.   3. The mitral valve is normal in structure. Trivial mitral valve  regurgitation. No evidence of mitral stenosis.   4. The aortic valve is tricuspid. Aortic valve regurgitation is not  visualized. No aortic stenosis is present.   5. The inferior vena cava is normal in size with greater than 50%  respiratory variability, suggesting right atrial pressure of 3 mmHg.   Cardiac Monitor 02/20: Sinus bradycardia to sinus tachycardia. Infrequent PACs, PVCs. Few very short SVT runs.   Sinus bradycardia to sinus tachycardia. Infrequent PACs, PVCs. Few very short SVT runs.  LHC 04/2017: Prox RCA to Mid RCA lesion, 0 %stenosed. Prox RCA lesion, 40 %stenosed. Ost LAD to Prox LAD lesion, 20 %stenosed. Prox LAD to Mid LAD lesion, 30 %stenosed. The left ventricular systolic function is normal. LV end diastolic pressure is normal. The left ventricular ejection fraction is greater than 65% by visual estimate. There is no mitral valve regurgitation.   1. Single vessel CAD with patent stent RCA. The RCA stent has mild to moderate restenosis. This does not appear to be flow limiting.  2. Mild disease LAD 3. Normal LV systolic function   Recommendations: Continue medical management of CAD.     EKG:  EKG is personally reviewed. 05/04/2022:  Sinus rhythm. Rate 63 bpm.  Recent Labs: 12/29/2021: ALT 23; BUN 18; Creatinine, Ser 0.90; Hemoglobin 14.4; Platelets 288.0; Potassium 3.9;  Sodium 137; TSH 1.35   Recent Lipid Panel    Component Value Date/Time   CHOL 145 12/29/2021 1102   CHOL 155 10/13/2020 0901   TRIG 84.0 12/29/2021 1102   HDL 63.70 12/29/2021 1102   HDL 60 10/13/2020 0901   CHOLHDL 2 12/29/2021 1102   VLDL 16.8 12/29/2021 1102   LDLCALC 65 12/29/2021 1102   LDLCALC 78 10/13/2020 0901     Risk Assessment/Calculations:           Physical Exam:    VS:  BP 120/68 (BP Location: Left Arm, Patient Position: Sitting, Cuff Size: Normal)   Pulse 63   Ht '4\' 10"'$  (1.473 m)   Wt 138 lb (62.6 kg)   BMI 28.84 kg/m     Wt Readings from Last 3 Encounters:  05/04/22 138 lb (62.6 kg)  04/26/22 138 lb (62.6 kg)  12/29/21 137 lb (62.1 kg)     GEN: Well nourished, well developed in no acute distress HEENT: Normal NECK: No JVD; No carotid bruits CARDIAC: RRR, 1/6 systolic murmur, no rubs or gallops RESPIRATORY:  Clear to auscultation without rales, wheezing or rhonchi  ABDOMEN: Soft, non-tender, non-distended MUSCULOSKELETAL:  Trace LE edema; Warm; No deformity  SKIN: Warm and dry NEUROLOGIC:  Alert and oriented x 3 PSYCHIATRIC:  Normal affect   ASSESSMENT:    1. Acute on chronic diastolic (congestive) heart failure ([place])   2. Coronary artery disease involving native coronary artery of native heart without angina pectoris   3. Palpitations   4. Edema, unspecified type   5. Essential hypertension   6. Hyperlipidemia, unspecified hyperlipidemia type  PLAN:    In order of problems listed above:  #CAD s/p PCI to RCA: Underwent RCA PCI in 2010. Cath 04/2017 with nonobstructive CAD and patent RCA stent. TTE with normal BiV function. No current anginal symptoms. -Continue lipitor '80mg'$  daily -Continue ASA '81mg'$  daily  #Acute on Chronic Diastolic HF: TTE 18/8416 with normal BiV function, LVEF 60-65%, G1DD, no significant valve disease. Currently with mild LE edema that is worse from prior. Will increase lasix dosing and check BNP today.   -Increase lasix to '40mg'$  in AM and '20mg'$  in PM x3 days and then back to '20mg'$  BID thereafter -Increase K to 64mq in Am and 280m in PM x3 days and then back to 4020mdaily thereafter -Check BMET and BNP today -Repeat BMET next week -If no change in edema, may need to stop dilt -Continue spiro 12.'5mg'$  daily -Continue jardiance '25mg'$  daily (for DMII) -Low Na diet  #Palpitations: 2-week monitor 09/04/2018 sinus bradycardia to sinus tachycardia with infrequent PACs, PVCs and very short runs of SVT. Well controlled on dilt. -Continue dilt '240mg'$  daily  #DMII: -A1C 7.4 -Managed by PCP -On jardiance '25mg'$  daily -Could consider ozempic     Follow-up:  6 months.  Medication Adjustments/Labs and Tests Ordered: Current medicines are reviewed at length with the patient today.  Concerns regarding medicines are outlined above.   Orders Placed This Encounter  Procedures   Basic metabolic panel   Pro b natriuretic peptide   Basic metabolic panel   EKG [id number] ordered this encounter  Medications   potassium chloride SA (KLOR-CON M) 20 MEQ tablet    Sig: Take 2 tablets (40 mEq total) by mouth in the AM and then take 1 tablet (20 mEq total) by mouth in the PM for 3 days only, then decrease to taking 2 tablets (40 mEq total) by mouth daily thereafter.    Dispense:  70 tablet    Refill:  0    Dose increase. MD advised do not send in pt has enough supply on hand at this time.   furosemide (LASIX) 20 MG tablet    Sig: Take 2 tablets (40 mg total) by mouth in the AM and then take 1 tablet (20 mg total) by mouth in the PM for 3 days only, then decrease to taking 1 tablet (20 mg total) by mouth twice daily thereafter.    Dispense:  70 tablet    Refill:  0    Dose increase. MD advised do not send in pt has enough supply on hand at this time.   Patient Instructions  Medication Instructions:   INCREASE YOUR LASIX TO TAKING 40 MG BY MOUTH IN THE MORNING THEN TAKE 20 MG BY MOUTH IN THE EVENING FOR  3 DAYS ONLY, THEN DECREASE TO TAKING 20 MG BY MOUTH TWICE DAILY THEREAFTER  INCREASE YOUR POTASSIUM CHLORIDE TO TAKING 40 mEq BY MOUTH IN THE MORNING THEN TAKE 20 mEq BY MOUTH IN THE EVENING FOR 3 DAYS ONLY, THEN DECREASE TO TAKING 40 mEq BY MOUTH DAILY THEREAFTER  *If you need a refill on your cardiac medications before your next appointment, please call your pharmacy*   Lab Work:  1.) TODAY--BMET AND PRO-BNP  2.) IN ONE WEEK HERE IN THE OFFICE--BMET  If you have labs (blood work) drawn today and your tests are completely normal, you will receive your results only by: MyCGraftonf you have MyChart) OR A paper copy in the mail If you have any lab test that  is abnormal or we need to change your treatment, we will call you to review the results.    Follow-Up: At [facility], you and your health needs are our priority.  As part of our continuing mission to provide you with exceptional heart care, we have created designated Provider Care Teams.  These Care Teams include your primary Cardiologist (physician) and Advanced Practice Providers (APPs -  Physician Assistants and Nurse Practitioners) who all work together to provide you with the care you need, when you need it.  We recommend signing up for the patient portal called "MyChart".  Sign up information is provided on this After Visit Summary.  MyChart is used to connect with patients for Virtual Visits (Telemedicine).  Patients are able to view lab/test results, encounter notes, upcoming appointments, etc.  Non-urgent messages can be sent to your provider as well.   To learn more about what you can do with MyChart, go to [email or web address].    Your next appointment:   6 month(s)  The format for your next appointment:   In Person  Provider:   DR. Johney Frame OR AN EXTENDER  Important Information About Sugar         I,Mathew Stumpf,acting as a scribe for Freada Bergeron, MD.,have documented all relevant  documentation on the behalf of Freada Bergeron, MD,as directed by  Freada Bergeron, MD while in the presence of Freada Bergeron, MD.  I, Freada Bergeron, MD, have reviewed all documentation for this visit. The documentation on 05/04/22 for the exam, diagnosis, procedures, and orders are all accurate and complete.   Signed, Freada Bergeron, MD  05/04/2022 12:16 PM    [place]

## 2022-05-04 NOTE — Patient Instructions (Signed)
Medication Instructions:   INCREASE YOUR LASIX TO TAKING 40 MG BY MOUTH IN THE MORNING THEN TAKE 20 MG BY MOUTH IN THE EVENING FOR 3 DAYS ONLY, THEN DECREASE TO TAKING 20 MG BY MOUTH TWICE DAILY THEREAFTER  INCREASE YOUR POTASSIUM CHLORIDE TO TAKING 40 mEq BY MOUTH IN THE MORNING THEN TAKE 20 mEq BY MOUTH IN THE EVENING FOR 3 DAYS ONLY, THEN DECREASE TO TAKING 40 mEq BY MOUTH DAILY THEREAFTER  *If you need a refill on your cardiac medications before your next appointment, please call your pharmacy*   Lab Work:  1.) TODAY--BMET AND PRO-BNP  2.) IN ONE WEEK HERE IN THE OFFICE--BMET  If you have labs (blood work) drawn today and your tests are completely normal, you will receive your results only by: MyChart Message (if you have MyChart) OR A paper copy in the mail If you have any lab test that is abnormal or we need to change your treatment, we will call you to review the results.    Follow-Up: At [facility], you and your health needs are our priority.  As part of our continuing mission to provide you with exceptional heart care, we have created designated Provider Care Teams.  These Care Teams include your primary Cardiologist (physician) and Advanced Practice Providers (APPs -  Physician Assistants and Nurse Practitioners) who all work together to provide you with the care you need, when you need it.  We recommend signing up for the patient portal called "MyChart".  Sign up information is provided on this After Visit Summary.  MyChart is used to connect with patients for Virtual Visits (Telemedicine).  Patients are able to view lab/test results, encounter notes, upcoming appointments, etc.  Non-urgent messages can be sent to your provider as well.   To learn more about what you can do with MyChart, go to [email or web address].    Your next appointment:   6 month(s)  The format for your next appointment:   In Person  Provider:   DR. Johney Frame OR AN EXTENDER  Important  Information About Sugar

## 2022-05-05 LAB — BASIC METABOLIC PANEL
BUN/Creatinine Ratio: 28 (ref 12–28)
BUN: 29 mg/dL — ABNORMAL HIGH (ref 8–27)
CO2: 26 mmol/L (ref 20–29)
Calcium: 10.2 mg/dL (ref 8.7–10.3)
Chloride: 100 mmol/L (ref 96–106)
Creatinine, Ser: 1.05 mg/dL — ABNORMAL HIGH (ref 0.57–1.00)
Glucose: 151 mg/dL — ABNORMAL HIGH (ref 70–99)
Potassium: 4.4 mmol/L (ref 3.5–5.2)
Sodium: 139 mmol/L (ref 134–144)
eGFR: 54 mL/min/{1.73_m2} — ABNORMAL LOW (ref >59)

## 2022-05-05 LAB — PRO B NATRIURETIC PEPTIDE: NT-Pro BNP: 64 pg/mL (ref 0–738)

## 2022-05-08 ENCOUNTER — Encounter: Payer: Self-pay | Admitting: Adult Health

## 2022-05-08 ENCOUNTER — Telehealth: Payer: Self-pay | Admitting: Cardiology

## 2022-05-08 MED ORDER — CARVEDILOL 6.25 MG PO TABS: 6.2500 mg | ORAL_TABLET | Freq: Two times a day (BID) | ORAL | 0 refills | Status: DC

## 2022-05-08 MED ORDER — POTASSIUM CHLORIDE CRYS ER 20 MEQ PO TBCR: 40.0000 meq | EXTENDED_RELEASE_TABLET | Freq: Every day | ORAL | 1 refills | Status: DC

## 2022-05-08 MED ORDER — FUROSEMIDE 20 MG PO TABS: 20.0000 mg | ORAL_TABLET | Freq: Two times a day (BID) | ORAL | 2 refills | Status: DC

## 2022-05-08 NOTE — Telephone Encounter (Signed)
The patient has been notified of the result and verbalized understanding.  All questions (if any) were answered.  Pt aware that she she needs to decrease her lasix to 20 mg po bid and decrease her KDUR to 40 mEq po daily.  Pt states she is interested in stopping dilitazem cd and switching to an alterative regimen.  Per Dr. Johney Frame, we will now stop her Dilitazem CD and start her on low dose coreg 6.25 mg po bid.  Dr. Johney Frame did want me to advise the pt that if her BP's start elevating she should notify Dr. Johney Frame of this, for we will increase the dose at that time.  Pt would like a month supply sent to her local and then she will notify our office a week before the refill of this medication goes out, so that we can kick this to her mail order [facility], thereafter.  Note placed to the local pharmacy about this.   Confirmed the pharmacy of choice with the pt.  Pt verbalized understanding and agrees with this plan.

## 2022-05-08 NOTE — Telephone Encounter (Signed)
-----   Message from Freada Bergeron, MD sent at 05/05/2022  8:15 AM EDT ----- Her labs look like she is actually a little on the dry side. BNP is normal. I would not increase her lasix further and instead go back to her '20mg'$  BID and her potassium 35mq daily. This may just be the diltiazem. Does she want to trial switching to a different med and see how she does.

## 2022-05-08 NOTE — Telephone Encounter (Signed)
Patient is returning call to discuss lab results. 

## 2022-05-08 NOTE — Telephone Encounter (Signed)
-----   Message from Freada Bergeron, MD sent at 05/05/2022  8:15 AM EDT ----- Her labs look like she is actually a little on the dry side. BNP is normal. I would not increase her lasix further and instead go back to her '20mg'$  BID and her potassium 60mq daily. This may just be the diltiazem. Does she want to trial switching to a different med and see how she does.

## 2022-05-09 NOTE — Telephone Encounter (Signed)
Please advise 

## 2022-05-11 ENCOUNTER — Ambulatory Visit: Payer: Medicare Other | Attending: Cardiology

## 2022-05-11 DIAGNOSIS — R609 Edema, unspecified: Secondary | ICD-10-CM

## 2022-05-11 DIAGNOSIS — R002 Palpitations: Secondary | ICD-10-CM

## 2022-05-11 DIAGNOSIS — I5033 Acute on chronic diastolic (congestive) heart failure: Secondary | ICD-10-CM

## 2022-05-11 DIAGNOSIS — I2510 Atherosclerotic heart disease of native coronary artery without angina pectoris: Secondary | ICD-10-CM | POA: Diagnosis not present

## 2022-05-11 DIAGNOSIS — E785 Hyperlipidemia, unspecified: Secondary | ICD-10-CM | POA: Diagnosis not present

## 2022-05-11 DIAGNOSIS — I10 Essential (primary) hypertension: Secondary | ICD-10-CM

## 2022-05-12 ENCOUNTER — Telehealth: Payer: Self-pay | Admitting: Cardiology

## 2022-05-12 DIAGNOSIS — M5416 Radiculopathy, lumbar region: Secondary | ICD-10-CM | POA: Diagnosis not present

## 2022-05-12 DIAGNOSIS — M5459 Other low back pain: Secondary | ICD-10-CM | POA: Diagnosis not present

## 2022-05-12 LAB — BASIC METABOLIC PANEL
BUN/Creatinine Ratio: 31 — ABNORMAL HIGH (ref 12–28)
BUN: 31 mg/dL — ABNORMAL HIGH (ref 8–27)
CO2: 29 mmol/L (ref 20–29)
Calcium: 10.7 mg/dL — ABNORMAL HIGH (ref 8.7–10.3)
Chloride: 99 mmol/L (ref 96–106)
Creatinine, Ser: 1 mg/dL (ref 0.57–1.00)
Glucose: 150 mg/dL — ABNORMAL HIGH (ref 70–99)
Potassium: 4.8 mmol/L (ref 3.5–5.2)
Sodium: 140 mmol/L (ref 134–144)
eGFR: 58 mL/min/{1.73_m2} — ABNORMAL LOW (ref >59)

## 2022-05-12 NOTE — Telephone Encounter (Signed)
I spoke with patient and reviewed results with her. She reports BP is unchanged.  Recent readings are 124/72, 126/72, 122/74.  Swelling had gotten a little better but has increased yesterday and today as she was sitting a lot the last 2 days due to a project she is working on. Swelling improves when she elevates legs.  She feels she is tolerating Coreg.   I let her know we would call her back if Dr Johney Frame recommended any changes.

## 2022-05-12 NOTE — Telephone Encounter (Signed)
Pt calling back for lab results

## 2022-05-12 NOTE — Telephone Encounter (Signed)
-----   Message from Freada Bergeron, MD sent at 05/12/2022  9:05 AM EDT ----- Her labs look stable. Electrolytes look good and Cr remains at 1. How is her swelling and her blood pressure? Is she tolerating the coreg?

## 2022-05-15 NOTE — Telephone Encounter (Signed)
Sheresa, [place] P - 05/12/2022 12:25 PM Johney Frame Greer Ee, MD  Sent: Fri May 12, 2022  1:05 PM  To: Thompson Grayer, RN  Cc: Nuala Alpha, LPN          Message  That's great. No changes in meds at this time!

## 2022-05-16 ENCOUNTER — Other Ambulatory Visit: Payer: Self-pay | Admitting: Adult Health

## 2022-05-16 ENCOUNTER — Other Ambulatory Visit: Payer: Self-pay

## 2022-05-16 DIAGNOSIS — F32A Depression, unspecified: Secondary | ICD-10-CM

## 2022-05-16 MED ORDER — CARVEDILOL 6.25 MG PO TABS: 6.2500 mg | ORAL_TABLET | Freq: Two times a day (BID) | ORAL | 3 refills | Status: DC

## 2022-05-18 ENCOUNTER — Ambulatory Visit (INDEPENDENT_AMBULATORY_CARE_PROVIDER_SITE_OTHER): Payer: Medicare Other | Admitting: Adult Health

## 2022-05-18 ENCOUNTER — Encounter: Payer: Self-pay | Admitting: Adult Health

## 2022-05-18 VITALS — BP 120/60 | HR 76 | Temp 97.8°F | Ht <= 58 in | Wt 141.0 lb

## 2022-05-18 DIAGNOSIS — M810 Age-related osteoporosis without current pathological fracture: Secondary | ICD-10-CM

## 2022-05-18 DIAGNOSIS — I2510 Atherosclerotic heart disease of native coronary artery without angina pectoris: Secondary | ICD-10-CM

## 2022-05-18 DIAGNOSIS — E119 Type 2 diabetes mellitus without complications: Secondary | ICD-10-CM | POA: Diagnosis not present

## 2022-05-18 LAB — POCT GLYCOSYLATED HEMOGLOBIN (HGB A1C): Hemoglobin A1C: 7.2 % — AB (ref 4.0–5.6)

## 2022-05-18 MED ORDER — DENOSUMAB 60 MG/ML ~~LOC~~ SOSY
60.0000 mg | PREFILLED_SYRINGE | Freq: Once | SUBCUTANEOUS | Status: AC
  Administered 2022-05-18: 60 mg via SUBCUTANEOUS

## 2022-05-18 MED ORDER — PIOGLITAZONE HCL 30 MG PO TABS: 30.0000 mg | ORAL_TABLET | Freq: Every day | ORAL | 0 refills | Status: DC

## 2022-05-18 NOTE — Patient Instructions (Addendum)
Your A1c was 7.2 - this has improved from 7.9

## 2022-05-18 NOTE — Addendum Note (Signed)
Addended by: Gwenyth Ober R on: 05/18/2022 08:17 AM   Modules accepted: Orders

## 2022-05-18 NOTE — Progress Notes (Signed)
Subjective:    Patient ID: Rebecca [name], female    DOB: [birth year], 76 y.o.   MRN: [id number]  HPI 76 year old female who  has a past medical history of Asthma, CAD (coronary artery disease), Chest pain, atypical, Complication of anesthesia, Diabetes ([place]), Edema, Fatigue, Fatigue, GERD (gastroesophageal reflux disease), Hyperlipidemia, Hypertension, Hypothyroidism, IBS (irritable bowel syndrome), Keratosis, seborrheic, Labyrinthitis, MI (myocardial infarction) ([place]), Osteoarthritis, PONV (postoperative nausea and vomiting), S/P arthroscopy of right shoulder (03/01/2018), and Skin cancer.  She presents to the office today for follow up regarding DM  DM Type 2 - currently managed with Jardiance 25 mg daily and actos 15 mg daily.  She does monitor her blood sugars at home with readings in the 130-140's for the most part. She has not had any low blood sugars. She talked to Dr. Johney Frame about Ozempic and she does not feel comfortable taking it. She is limited in her activity due to chronic pain in her back and groin for which she is being worked up by Dr. Maureen Ralphs.   Lab Results  Component Value Date   HGBA1C 7.9 (H) 12/29/2021   She will also receive her Prolia shot today  Review of Systems See HPI   Past Medical History:  Diagnosis Date   Asthma    extrinsic   CAD (coronary artery disease)    Chest pain, atypical    Complication of anesthesia    Diabetes ([place])    Edema    Fatigue    Fatigue    GERD (gastroesophageal reflux disease)    Hyperlipidemia    Hypertension    familiar   Hypothyroidism    IBS (irritable bowel syndrome)    hx   Keratosis, seborrheic    Labyrinthitis    MI (myocardial infarction) ([place])    age 72   Osteoarthritis    PONV (postoperative nausea and vomiting)    S/P arthroscopy of right shoulder 03/01/2018   Skin cancer    Melanoma     Social History   Socioeconomic History   Marital status: Married    Spouse name: Not on file   Number of  children: 2   Years of education: Not on file   Highest education level: Not on file  Occupational History   Occupation: RN-retired    Employer: RETIRED  Tobacco Use   Smoking status: Never   Smokeless tobacco: Never  Vaping Use   Vaping Use: Never used  Substance and Sexual Activity   Alcohol use: No    Comment: rarely   Drug use: No   Sexual activity: Not on file  Other Topics Concern   Not on file  Social History Narrative   ** Merged History Encounter **       Social Determinants of Health   Financial Resource Strain: Low Risk  (04/26/2022)   Overall Financial Resource Strain (CARDIA)    Difficulty of Paying Living Expenses: Not hard at all  Food Insecurity: No Food Insecurity (04/26/2022)   Hunger Vital Sign    Worried About Running Out of Food in the Last Year: Never true    Ran Out of Food in the Last Year: Never true  Transportation Needs: No Transportation Needs (04/26/2022)   PRAPARE - Hydrologist (Medical): No    Lack of Transportation (Non-Medical): No  Physical Activity: Insufficiently Active (04/26/2022)   Exercise Vital Sign    Days of Exercise per Week: 2 days    Minutes of  Exercise per Session: 60 min  Stress: No Stress Concern Present (04/26/2022)   [place]    Feeling of Stress : Not at all  Social Connections: [place] (04/26/2022)   Social Connection and Isolation Panel [NHANES]    Frequency of Communication with Friends and Family: More than three times a week    Frequency of Social Gatherings with Friends and Family: More than three times a week    Attends Religious Services: More than 4 times per year    Active Member of Clubs or Organizations: Yes    Attends Archivist Meetings: More than 4 times per year    Marital Status: Married  Human resources officer Violence: Not At Risk (04/26/2022)   Humiliation, Afraid, Rape, and Kick  questionnaire    Fear of Current or Ex-Partner: No    Emotionally Abused: No    Physically Abused: No    Sexually Abused: No    Past Surgical History:  Procedure Laterality Date   Breast Biopsy     dense breast tissue   BREAST EXCISIONAL BIOPSY Left 2000   benign   CHOLECYSTECTOMY     EUS  04/04/2012   Procedure: UPPER ENDOSCOPIC ULTRASOUND (EUS) LINEAR;  Surgeon: Milus Banister, MD;  Location: [facility];  Service: Endoscopy;  Laterality: N/A;  radial linear   KNEE ARTHROPLASTY     total right  02-28-2010 Dr Gaynelle Arabian, left   KNEE ARTHROPLASTY Bilateral 09/12/2017   Hector Shade   LEFT HEART CATH AND CORONARY ANGIOGRAPHY N/A 04/20/2017   Procedure: LEFT HEART CATH AND CORONARY ANGIOGRAPHY;  Surgeon: Burnell Blanks, MD;  Location: [facility];  Service: Cardiovascular;  Laterality: N/A;   TOTAL HIP ARTHROPLASTY Right 11/12/2019   Procedure: TOTAL HIP ARTHROPLASTY ANTERIOR APPROACH;  Surgeon: Gaynelle Arabian, MD;  Location: [facility];  Service: Orthopedics;  Laterality: Right;  137mn   TUBAL LIGATION      Family History  Problem Relation Age of Onset   Heart disease Mother    Hypertension Mother    Heart attack Mother 453      MULTIPLES OVER TIME   Melanoma Daughter 265      METASTATIC   Breast cancer Sister    Hypertension Father    Hypertension Sister     Allergies  Allergen Reactions   Sulfa Antibiotics      SKathreen Cosiersyndrome   Sulfonamide Derivatives     SKathreen Cosiersyndrome    Current Outpatient Medications on File Prior to Visit  Medication Sig Dispense Refill   Accu-Chek FastClix Lancets MISC USE   TO CHECK GLUCOSE TWICE DAILY 102 each 3   ACCU-CHEK GUIDE test strip USE TO TEST BLOOD GLUCOSE TWICE DAILY 200 strip 1   acetaminophen (TYLENOL) 325 MG tablet Take 650 mg by mouth every 6 (six) hours as needed for moderate pain.     albuterol (VENTOLIN HFA) 108 (90 Base) MCG/ACT inhaler Inhale 2 puffs into the lungs every 6 (six) hours  as needed for wheezing or shortness of breath. 8 g 0   atorvastatin (LIPITOR) 80 MG tablet Take 1 tablet (80 mg total) by mouth daily. 90 tablet 3   buPROPion (WELLBUTRIN XL) 300 MG 24 hr tablet TAKE 1 TABLET BY MOUTH DAILY 90 tablet 3   carvedilol (COREG) 6.25 MG tablet Take 1 tablet (6.25 mg total) by mouth 2 (two) times daily with a meal. 180 tablet 3   conjugated  estrogens (PREMARIN) vaginal cream Place 0.5 Applicatorfuls vaginally every 7 (seven) days. 42.5 g 6   denosumab (PROLIA) 60 MG/ML SOSY injection Inject 60 mg into the skin every 6 (six) months.     Diclofenac Sodium 1 % CREA Apply topically.     empagliflozin (JARDIANCE) 25 MG TABS tablet Take 1 tablet (25 mg total) by mouth daily before breakfast. 90 tablet 3   furosemide (LASIX) 20 MG tablet Take 1 tablet (20 mg total) by mouth 2 (two) times daily. 180 tablet 2   levothyroxine (SYNTHROID) 50 MCG tablet Take 1 tablet (50 mcg total) by mouth daily before breakfast. 90 tablet 3   nitroGLYCERIN (NITROSTAT) 0.4 MG SL tablet DISSOLVE 1 TABLET UNDER THE TONGUE EVERY 5 MINUTES AS&nbsp;&nbsp;NEEDED FOR CHEST PAIN. MAX&nbsp;&nbsp;3 DOSES IN 15 MINUTES. CALL 911 IF CHEST PAIN PERSISTS 100 tablet 6   pioglitazone (ACTOS) 15 MG tablet TAKE 1 TABLET BY MOUTH DAILY 90 tablet 3   potassium chloride SA (KLOR-CON M) 20 MEQ tablet Take 2 tablets (40 mEq total) by mouth daily. 180 tablet 1   spironolactone (ALDACTONE) 25 MG tablet Take 0.5 tablets (12.5 mg total) by mouth daily. 45 tablet 3   No current facility-administered medications on file prior to visit.    BP 120/60   Pulse 76   Temp 97.8 F (36.6 C) (Oral)   Ht '4\' 10"'$  (1.473 m)   Wt 141 lb (64 kg)   SpO2 97%   BMI 29.47 kg/m       Objective:   Physical Exam Vitals and nursing note reviewed.  Constitutional:      Appearance: Normal appearance.  Cardiovascular:     Rate and Rhythm: Normal rate and regular rhythm.     Pulses: Normal pulses.     Heart sounds: Normal heart  sounds.  Pulmonary:     Effort: Pulmonary effort is normal.     Breath sounds: Normal breath sounds.  Abdominal:     General: Abdomen is flat.  Skin:    General: Skin is warm and dry.     Capillary Refill: Capillary refill takes less than 2 seconds.  Neurological:     General: No focal deficit present.     Mental Status: She is alert and oriented to person, place, and time.  Psychiatric:        Mood and Affect: Mood normal.        Behavior: Behavior normal.        Thought Content: Thought content normal.        Judgment: Judgment normal.       Assessment & Plan:   1. Diabetes mellitus without complication ([place])  - POC HgB A1c- 7.2 - has improved. We will increase her Actos to 30 mg.  - Follow up in 3 months  - pioglitazone (ACTOS) 30 MG tablet; Take 1 tablet (30 mg total) by mouth daily.  Dispense: 90 tablet; Refill: 0   Time spent with patient today was 30 minutes which consisted of chart review, discussing DM, work up, treatment answering questions and documentation.  [name], NP

## 2022-05-23 ENCOUNTER — Encounter: Payer: Self-pay | Admitting: Adult Health

## 2022-05-24 NOTE — Telephone Encounter (Signed)
FYI

## 2022-06-01 DIAGNOSIS — M5416 Radiculopathy, lumbar region: Secondary | ICD-10-CM | POA: Diagnosis not present

## 2022-06-15 DIAGNOSIS — M5416 Radiculopathy, lumbar region: Secondary | ICD-10-CM | POA: Diagnosis not present

## 2022-06-15 DIAGNOSIS — M542 Cervicalgia: Secondary | ICD-10-CM | POA: Diagnosis not present

## 2022-06-15 DIAGNOSIS — M47896 Other spondylosis, lumbar region: Secondary | ICD-10-CM | POA: Diagnosis not present

## 2022-06-20 DIAGNOSIS — Z85820 Personal history of malignant melanoma of skin: Secondary | ICD-10-CM | POA: Diagnosis not present

## 2022-06-20 DIAGNOSIS — L814 Other melanin hyperpigmentation: Secondary | ICD-10-CM | POA: Diagnosis not present

## 2022-06-20 DIAGNOSIS — Z86018 Personal history of other benign neoplasm: Secondary | ICD-10-CM | POA: Diagnosis not present

## 2022-06-20 DIAGNOSIS — Z85828 Personal history of other malignant neoplasm of skin: Secondary | ICD-10-CM | POA: Diagnosis not present

## 2022-06-20 DIAGNOSIS — L309 Dermatitis, unspecified: Secondary | ICD-10-CM | POA: Diagnosis not present

## 2022-06-20 DIAGNOSIS — L821 Other seborrheic keratosis: Secondary | ICD-10-CM | POA: Diagnosis not present

## 2022-06-20 DIAGNOSIS — L578 Other skin changes due to chronic exposure to nonionizing radiation: Secondary | ICD-10-CM | POA: Diagnosis not present

## 2022-06-20 DIAGNOSIS — D485 Neoplasm of uncertain behavior of skin: Secondary | ICD-10-CM | POA: Diagnosis not present

## 2022-06-20 DIAGNOSIS — D225 Melanocytic nevi of trunk: Secondary | ICD-10-CM | POA: Diagnosis not present

## 2022-06-26 DIAGNOSIS — M6281 Muscle weakness (generalized): Secondary | ICD-10-CM | POA: Diagnosis not present

## 2022-06-26 DIAGNOSIS — M542 Cervicalgia: Secondary | ICD-10-CM | POA: Diagnosis not present

## 2022-07-03 DIAGNOSIS — M6281 Muscle weakness (generalized): Secondary | ICD-10-CM | POA: Diagnosis not present

## 2022-07-03 DIAGNOSIS — M542 Cervicalgia: Secondary | ICD-10-CM | POA: Diagnosis not present

## 2022-07-07 DIAGNOSIS — M542 Cervicalgia: Secondary | ICD-10-CM | POA: Diagnosis not present

## 2022-07-07 DIAGNOSIS — M6281 Muscle weakness (generalized): Secondary | ICD-10-CM | POA: Diagnosis not present

## 2022-07-12 ENCOUNTER — Other Ambulatory Visit: Payer: Self-pay | Admitting: Adult Health

## 2022-07-12 DIAGNOSIS — E119 Type 2 diabetes mellitus without complications: Secondary | ICD-10-CM

## 2022-07-13 DIAGNOSIS — M542 Cervicalgia: Secondary | ICD-10-CM | POA: Diagnosis not present

## 2022-07-13 DIAGNOSIS — M6281 Muscle weakness (generalized): Secondary | ICD-10-CM | POA: Diagnosis not present

## 2022-07-14 DIAGNOSIS — M6281 Muscle weakness (generalized): Secondary | ICD-10-CM | POA: Diagnosis not present

## 2022-07-14 DIAGNOSIS — M542 Cervicalgia: Secondary | ICD-10-CM | POA: Diagnosis not present

## 2022-07-20 DIAGNOSIS — M542 Cervicalgia: Secondary | ICD-10-CM | POA: Diagnosis not present

## 2022-07-20 DIAGNOSIS — M6281 Muscle weakness (generalized): Secondary | ICD-10-CM | POA: Diagnosis not present

## 2022-08-18 DIAGNOSIS — M545 Low back pain, unspecified: Secondary | ICD-10-CM | POA: Diagnosis not present

## 2022-08-18 DIAGNOSIS — M47896 Other spondylosis, lumbar region: Secondary | ICD-10-CM | POA: Diagnosis not present

## 2022-08-18 DIAGNOSIS — M5416 Radiculopathy, lumbar region: Secondary | ICD-10-CM | POA: Diagnosis not present

## 2022-08-26 DIAGNOSIS — M5416 Radiculopathy, lumbar region: Secondary | ICD-10-CM | POA: Diagnosis not present

## 2022-08-26 DIAGNOSIS — M545 Low back pain, unspecified: Secondary | ICD-10-CM | POA: Diagnosis not present

## 2022-08-28 ENCOUNTER — Telehealth: Payer: Self-pay | Admitting: Adult Health

## 2022-08-28 NOTE — Telephone Encounter (Signed)
Pt is calling and [facility] is waiting on diabetic written order. The form is  in cory folder on s drive

## 2022-08-29 NOTE — Telephone Encounter (Signed)
Noted. Prescriptions sent to [facility].

## 2022-08-30 DIAGNOSIS — M47896 Other spondylosis, lumbar region: Secondary | ICD-10-CM | POA: Diagnosis not present

## 2022-08-30 DIAGNOSIS — M5416 Radiculopathy, lumbar region: Secondary | ICD-10-CM | POA: Diagnosis not present

## 2022-08-30 NOTE — Telephone Encounter (Signed)
Spoke to pt and advised that the form was sent with confirmation 08/29/2022. Pt also advised that pharmacy stated they were waiting on insurance. Pharmacy returned call and wanted me to send the form to them instead. Form faxed with confirmation. Tried to call pt to update but no answer.

## 2022-09-13 ENCOUNTER — Ambulatory Visit
Admission: EM | Admit: 2022-09-13 | Discharge: 2022-09-13 | Disposition: A | Discharge status: Home / Self Care | Payer: Medicare Other | Attending: Nurse Practitioner | Admitting: Nurse Practitioner

## 2022-09-13 ENCOUNTER — Emergency Department (HOSPITAL_BASED_OUTPATIENT_CLINIC_OR_DEPARTMENT_OTHER)
Admission: EM | Admit: 2022-09-13 | Discharge: 2022-09-13 | Disposition: A | Discharge status: Home / Self Care | Payer: Medicare Other | Attending: Emergency Medicine | Admitting: Emergency Medicine

## 2022-09-13 ENCOUNTER — Encounter (HOSPITAL_BASED_OUTPATIENT_CLINIC_OR_DEPARTMENT_OTHER): Payer: Self-pay | Admitting: Emergency Medicine

## 2022-09-13 ENCOUNTER — Emergency Department (HOSPITAL_BASED_OUTPATIENT_CLINIC_OR_DEPARTMENT_OTHER): Payer: Medicare Other

## 2022-09-13 ENCOUNTER — Other Ambulatory Visit: Payer: Self-pay

## 2022-09-13 DIAGNOSIS — I10 Essential (primary) hypertension: Secondary | ICD-10-CM | POA: Diagnosis not present

## 2022-09-13 DIAGNOSIS — Z79899 Other long term (current) drug therapy: Secondary | ICD-10-CM | POA: Diagnosis not present

## 2022-09-13 DIAGNOSIS — A084 Viral intestinal infection, unspecified: Secondary | ICD-10-CM | POA: Diagnosis not present

## 2022-09-13 DIAGNOSIS — R81 Glycosuria: Secondary | ICD-10-CM

## 2022-09-13 DIAGNOSIS — R1031 Right lower quadrant pain: Secondary | ICD-10-CM | POA: Diagnosis not present

## 2022-09-13 DIAGNOSIS — I2510 Atherosclerotic heart disease of native coronary artery without angina pectoris: Secondary | ICD-10-CM | POA: Diagnosis not present

## 2022-09-13 DIAGNOSIS — Z20822 Contact with and (suspected) exposure to covid-19: Secondary | ICD-10-CM | POA: Insufficient documentation

## 2022-09-13 DIAGNOSIS — E119 Type 2 diabetes mellitus without complications: Secondary | ICD-10-CM | POA: Diagnosis not present

## 2022-09-13 DIAGNOSIS — J45909 Unspecified asthma, uncomplicated: Secondary | ICD-10-CM | POA: Diagnosis not present

## 2022-09-13 DIAGNOSIS — I700 Atherosclerosis of aorta: Secondary | ICD-10-CM | POA: Diagnosis not present

## 2022-09-13 DIAGNOSIS — R109 Unspecified abdominal pain: Secondary | ICD-10-CM | POA: Diagnosis not present

## 2022-09-13 DIAGNOSIS — E039 Hypothyroidism, unspecified: Secondary | ICD-10-CM | POA: Insufficient documentation

## 2022-09-13 LAB — RESP PANEL BY RT-PCR (RSV, FLU A&B, COVID)  RVPGX2
Influenza A by PCR: NEGATIVE
Influenza B by PCR: NEGATIVE
Resp Syncytial Virus by PCR: NEGATIVE
SARS Coronavirus 2 by RT PCR: NEGATIVE

## 2022-09-13 LAB — POCT URINALYSIS DIP (MANUAL ENTRY)
Bilirubin, UA: NEGATIVE
Blood, UA: NEGATIVE
Glucose, UA: >=1000 mg/dL — AB
Leukocytes, UA: NEGATIVE
Nitrite, UA: NEGATIVE
Protein Ur, POC: NEGATIVE mg/dL
Spec Grav, UA: <=1.005 — AB (ref 1.010–1.025)
Urobilinogen, UA: 0.2 E.U./dL
pH, UA: 5.5 (ref 5.0–8.0)

## 2022-09-13 LAB — CBC
HCT: 43.9 % (ref 36.0–46.0)
Hemoglobin: 14.4 g/dL (ref 12.0–15.0)
MCH: 31.1 pg (ref 26.0–34.0)
MCHC: 32.8 g/dL (ref 30.0–36.0)
MCV: 94.8 fL (ref 80.0–100.0)
Platelets: 247 10*3/uL (ref 150–400)
RBC: 4.63 MIL/uL (ref 3.87–5.11)
RDW: 13 % (ref 11.5–15.5)
WBC: 5.2 10*3/uL (ref 4.0–10.5)
nRBC: 0 % (ref 0.0–0.2)

## 2022-09-13 LAB — COMPREHENSIVE METABOLIC PANEL
ALT: 24 U/L (ref 0–44)
AST: 24 U/L (ref 15–41)
Albumin: 4.3 g/dL (ref 3.5–5.0)
Alkaline Phosphatase: 66 U/L (ref 38–126)
Anion gap: 9 (ref 5–15)
BUN: 28 mg/dL — ABNORMAL HIGH (ref 8–23)
CO2: 24 mmol/L (ref 22–32)
Calcium: 8.7 mg/dL — ABNORMAL LOW (ref 8.9–10.3)
Chloride: 99 mmol/L (ref 98–111)
Creatinine, Ser: 1 mg/dL (ref 0.44–1.00)
GFR, Estimated: 58 mL/min — ABNORMAL LOW (ref >60)
Glucose, Bld: 132 mg/dL — ABNORMAL HIGH (ref 70–99)
Potassium: 4.2 mmol/L (ref 3.5–5.1)
Sodium: 132 mmol/L — ABNORMAL LOW (ref 135–145)
Total Bilirubin: 0.9 mg/dL (ref 0.3–1.2)
Total Protein: 7 g/dL (ref 6.5–8.1)

## 2022-09-13 LAB — POCT FASTING CBG KUC MANUAL ENTRY: POCT Glucose (KUC): 137 mg/dL — AB (ref 70–99)

## 2022-09-13 LAB — LIPASE, BLOOD: Lipase: 26 U/L (ref 11–51)

## 2022-09-13 MED ORDER — IOHEXOL 300 MG/ML  SOLN
100.0000 mL | Freq: Once | INTRAMUSCULAR | Status: AC | PRN
  Administered 2022-09-13: 100 mL via INTRAVENOUS

## 2022-09-13 MED ORDER — ONDANSETRON HCL 4 MG PO TABS: 4.0000 mg | ORAL_TABLET | Freq: Four times a day (QID) | ORAL | 0 refills | Status: DC | PRN

## 2022-09-13 MED ORDER — DICYCLOMINE HCL 20 MG PO TABS: 20.0000 mg | ORAL_TABLET | Freq: Two times a day (BID) | ORAL | 0 refills | Status: DC

## 2022-09-13 MED ORDER — ONDANSETRON HCL 4 MG/2ML IJ SOLN
4.0000 mg | Freq: Once | INTRAMUSCULAR | Status: AC
  Administered 2022-09-13: 4 mg via INTRAVENOUS
  Filled 2022-09-13: qty 2

## 2022-09-13 MED ORDER — SODIUM CHLORIDE 0.9 % IV BOLUS
1000.0000 mL | Freq: Once | INTRAVENOUS | Status: AC
  Administered 2022-09-13: 1000 mL via INTRAVENOUS

## 2022-09-13 MED ORDER — KETOROLAC TROMETHAMINE 15 MG/ML IJ SOLN
15.0000 mg | Freq: Once | INTRAMUSCULAR | Status: AC
  Administered 2022-09-13: 15 mg via INTRAVENOUS
  Filled 2022-09-13: qty 1

## 2022-09-13 NOTE — ED Triage Notes (Signed)
Pt presents with RLQ pain, hot flashes, nausea, diarrhea X 1 week ago.

## 2022-09-13 NOTE — ED Triage Notes (Signed)
Pt w/ RLQ since last night; not feeling well x 4-5 days; some radiation to RT flank; +nausea, loose stools

## 2022-09-13 NOTE — ED Provider Notes (Cosign Needed Addendum)
[facility] Provider Note   CSN: [id number] Arrival date & time: 09/13/22  1322     History   Chief Complaint  Patient presents with   Abdominal Pain    Rebecca [name] is a 77 y.o. female with medical history of asthma, CAD, diabetes, GERD, hypertension, hypothyroid, IBS, labyrinth-itis.  Patient presents to ED for evaluation of right-sided abdominal pain.  Patient reports that for the last 1 week she has had off-and-on right-sided lower abdominal pain that she described as "just there".  Patient reports that symptoms were waxing and waning for the last week.  Patient states that in the last day or so the symptoms seem to have worsened.  Patient states that lower abdominal pain on right side is now radiating into her right flank.  The patient denies any abdominal pain radiating through to her back.  The patient endorses nausea with bloating but denies any vomiting.  Patient also endorsing diarrhea for the last 1 week however denies any blood in her stool.  Patient denies any overt dysuria with me, denies hematuria, frequency or urgency.  Patient denies fevers, chest pain, shortness of breath.  Patient denies history of diverticulitis.  Patient denies medications prior to arrival.  Patient was seen in urgent care prior to ED arrival, redirected to ED due to location of pain.   Abdominal Pain Associated symptoms: nausea   Associated symptoms: no dysuria        Home Medications Prior to Admission medications   Medication Sig Start Date End Date Taking? Authorizing Provider  dicyclomine (BENTYL) 20 MG tablet Take 1 tablet (20 mg total) by mouth 2 (two) times daily. 09/13/22  Yes Azucena Cecil, PA-C  ondansetron (ZOFRAN) 4 MG tablet Take 1 tablet (4 mg total) by mouth every 6 (six) hours as needed for nausea or vomiting. 09/13/22  Yes Azucena Cecil, PA-C  Accu-Chek FastClix Lancets MISC USE   TO CHECK GLUCOSE TWICE DAILY 11/14/18    Nafziger, Tommi Rumps, NP  ACCU-CHEK GUIDE test strip USE TO TEST BLOOD GLUCOSE TWICE DAILY 11/30/21   Nafziger, Tommi Rumps, NP  acetaminophen (TYLENOL) 325 MG tablet Take 650 mg by mouth every 6 (six) hours as needed for moderate pain.    [provider]  albuterol (VENTOLIN HFA) 108 (90 Base) MCG/ACT inhaler Inhale 2 puffs into the lungs every 6 (six) hours as needed for wheezing or shortness of breath. 12/14/21   Nafziger, Tommi Rumps, NP  atorvastatin (LIPITOR) 80 MG tablet Take 1 tablet (80 mg total) by mouth daily. 12/29/21   Nafziger, Tommi Rumps, NP  buPROPion (WELLBUTRIN XL) 300 MG 24 hr tablet TAKE 1 TABLET BY MOUTH DAILY 05/16/22   Nafziger, Tommi Rumps, NP  carvedilol (COREG) 6.25 MG tablet Take 1 tablet (6.25 mg total) by mouth 2 (two) times daily with a meal. 05/16/22   Freada Bergeron, MD  conjugated estrogens (PREMARIN) vaginal cream Place 0.5 Applicatorfuls vaginally every 7 (seven) days. 12/19/19   Nafziger, Tommi Rumps, NP  denosumab (PROLIA) 60 MG/ML SOSY injection Inject 60 mg into the skin every 6 (six) months.    [provider]  Diclofenac Sodium 1 % CREA Apply topically.    [provider]  empagliflozin (JARDIANCE) 25 MG TABS tablet Take 1 tablet (25 mg total) by mouth daily before breakfast. 12/29/21   Nafziger, Tommi Rumps, NP  furosemide (LASIX) 20 MG tablet Take 1 tablet (20 mg total) by mouth 2 (two) times daily. 05/08/22   Freada Bergeron,  MD  levothyroxine (SYNTHROID) 50 MCG tablet Take 1 tablet (50 mcg total) by mouth daily before breakfast. 12/30/21   Nafziger, Tommi Rumps, NP  nitroGLYCERIN (NITROSTAT) 0.4 MG SL tablet DISSOLVE 1 TABLET UNDER THE TONGUE EVERY 5 MINUTES AS&nbsp;&nbsp;NEEDED FOR CHEST PAIN. MAX&nbsp;&nbsp;3 DOSES IN 15 MINUTES. CALL 911 IF CHEST PAIN PERSISTS 12/19/19   Nafziger, Tommi Rumps, NP  pioglitazone (ACTOS) 30 MG tablet TAKE 1 TABLET BY MOUTH DAILY 07/13/22   Nafziger, Tommi Rumps, NP  potassium chloride SA (KLOR-CON M) 20 MEQ tablet Take 2 tablets (40 mEq total) by mouth daily.  05/08/22   Freada Bergeron, MD  spironolactone (ALDACTONE) 25 MG tablet Take 0.5 tablets (12.5 mg total) by mouth daily. 11/14/21   Freada Bergeron, MD      Allergies    Sulfa antibiotics and Sulfonamide derivatives    Review of Systems   Review of Systems  Gastrointestinal:  Positive for abdominal pain and nausea.  Genitourinary:  Positive for flank pain. Negative for dysuria.  All other systems reviewed and are negative.   Physical Exam Updated Vital Signs BP 125/66   Pulse 70   Temp 97.9 F (36.6 C) (Oral)   Resp 17   Ht '4\' 10"'$  (1.473 m)   Wt 62.6 kg   SpO2 99%   BMI 28.84 kg/m  Physical Exam Vitals and nursing note reviewed.  Constitutional:      General: She is not in acute distress.    Appearance: Normal appearance. She is not ill-appearing, toxic-appearing or diaphoretic.  HENT:     Head: Normocephalic and atraumatic.     Nose: Nose normal. No congestion.     Mouth/Throat:     Mouth: Mucous membranes are moist.     Pharynx: Oropharynx is clear.  Eyes:     Extraocular Movements: Extraocular movements intact.     Conjunctiva/sclera: Conjunctivae normal.     Pupils: Pupils are equal, round, and reactive to light.  Cardiovascular:     Rate and Rhythm: Normal rate and regular rhythm.  Pulmonary:     Effort: Pulmonary effort is normal.     Breath sounds: Normal breath sounds. No wheezing.  Abdominal:     General: Abdomen is flat. Bowel sounds are normal.     Palpations: Abdomen is soft.     Tenderness: There is abdominal tenderness. There is no right CVA tenderness or left CVA tenderness.  Musculoskeletal:     Cervical back: Normal range of motion and neck supple. No tenderness.  Skin:    General: Skin is warm and dry.     Capillary Refill: Capillary refill takes less than 2 seconds.  Neurological:     Mental Status: She is alert and oriented to person, place, and time.     ED Results / Procedures / Treatments   Labs (all labs ordered are listed,  but only abnormal results are displayed) Labs Reviewed  COMPREHENSIVE METABOLIC PANEL - Abnormal; Notable for the following components:      Result Value   Sodium 132 (*)    Glucose, Bld 132 (*)    BUN 28 (*)    Calcium 8.7 (*)    GFR, Estimated 58 (*)    All other components within normal limits  RESP PANEL BY RT-PCR (RSV, FLU A&B, COVID)  RVPGX2  LIPASE, BLOOD  CBC    EKG None  Radiology CT ABDOMEN PELVIS W CONTRAST  Result Date: 09/13/2022 CLINICAL DATA:  Right lower quadrant abdominal pain. Nausea and diarrhea over the last week.  EXAM: CT ABDOMEN AND PELVIS WITH CONTRAST TECHNIQUE: Multidetector CT imaging of the abdomen and pelvis was performed using the standard protocol following bolus administration of intravenous contrast. RADIATION DOSE REDUCTION: This exam was performed according to the departmental dose-optimization program which includes automated exposure control, adjustment of the mA and/or kV according to patient size and/or use of iterative reconstruction technique. CONTRAST:  148m OMNIPAQUE IOHEXOL 300 MG/ML  SOLN COMPARISON:  CT 07/05/2012 FINDINGS: Lower chest: Mild chronic scarring at both lung bases. Hepatobiliary: Liver parenchyma is normal. Previous cholecystectomy. Mild biliary ductal prominence, typical in elderly patients of head cholecystectomy. Pancreas: Normal Spleen: Normal Adrenals/Urinary Tract: Adrenal glands are normal. Kidneys are normal. No stone, mass or hydronephrosis. Bladder is normal. Stomach/Bowel: Stomach and small intestine are normal. Normal appendix. Right colon appears normal. No inflammatory changes seen in the region. Normal amount of gas and fecal matter within the colon. Mild diverticulosis of the left colon. No evidence of diverticulitis. Vascular/Lymphatic: Aortic atherosclerosis. No aneurysm. IVC is normal. No adenopathy. Reproductive: No pelvic mass. Other: No free fluid or air. Musculoskeletal: Ordinary chronic lumbar degenerative changes  and spinal curvature. IMPRESSION: 1. No acute finding to explain the clinical presentation. Normal appendix. No evidence of bowel obstruction or inflammation. 2. Previous cholecystectomy. Mild biliary ductal prominence, typical in elderly patients following cholecystectomy. 3. Aortic atherosclerosis. 4. Ordinary chronic lumbar degenerative changes and spinal curvature. Aortic Atherosclerosis (ICD10-I70.0). Electronically Signed   By: MNelson ChimesM.D.   On: 09/13/2022 16:04    Procedures Procedures   Medications Ordered in ED Medications  sodium chloride 0.9 % bolus 1,000 mL (1,000 mLs Intravenous New Bag/Given 09/13/22 1512)  ondansetron (ZOFRAN) injection 4 mg (4 mg Intravenous Given 09/13/22 1513)  ketorolac (TORADOL) 15 MG/ML injection 15 mg (15 mg Intravenous Given 09/13/22 1512)  iohexol (OMNIPAQUE) 300 MG/ML solution 100 mL (100 mLs Intravenous Contrast Given 09/13/22 1542)    ED Course/ Medical Decision Making/ A&P  Medical Decision Making Amount and/or Complexity of Data Reviewed Labs: ordered. Radiology: ordered.  Risk Prescription drug management.   On examination the patient is afebrile and nontachycardic.  Patient lung sounds are clear bilaterally, she is not hypoxic on room air.  Abdomen is soft and compressible however the patient does have tenderness in her right lower quadrant.  Patient nontoxic in appearance.  Patient workup will include CBC, CMP, lipase, viral panel, CT abdomen pelvis with contrast.  Patient urinalysis was collected at urgent care, was significant for ketones and glucosuria.  CBC unremarkable with no leukocytosis or anemia.  CMP with decreased sodium to 132 repleted with 1 L normal saline, elevated glucose to 132 noncontributory to symptoms.  Patient lipase unremarkable.  Patient viral panel negative for all.  CT scan the patient abdomen does not show any evidence of intra-abdominal pathology, appendicitis, bowel obstruction, stool burden.  On further  questioning, patient reports she lives at assisted living facility where viral gastroenteritis is currently going around.  Patient most likely suffering from viral gastroenteritis.  Patient will be sent home with Bentyl, Zofran and referred to GI for further management.  Patient provided return precautions and she voiced understanding.  Patient had all of her questions answered to her satisfaction.  The patient is stable at this time for discharge home.  Case discussed with attending Dr. YDarl Householderwho voices agreement with plan of management.   Final Clinical Impression(s) / ED Diagnoses Final diagnoses:  Viral gastroenteritis    Rx / DC Orders ED Discharge Orders  Ordered    dicyclomine (BENTYL) 20 MG tablet  2 times daily        09/13/22 1703    ondansetron (ZOFRAN) 4 MG tablet  Every 6 hours PRN        09/13/22 1703                  Azucena Cecil, PA-C 09/13/22 [place], [place], DO 09/14/22 [phone]

## 2022-09-13 NOTE — Discharge Instructions (Signed)
Please go to the emergency room for further evaluation and treatment of your abdominal pain

## 2022-09-13 NOTE — ED Provider Notes (Signed)
[facility]    CSN: [id number] Arrival date & time: 09/13/22  1214      History   Chief Complaint Chief Complaint  Patient presents with   Nausea   Back Pain   Abdominal Pain    HPI Rebecca [name] is a 77 y.o. female presents for evaluation of abdominal pain.  Patient reports 1 week of a constant with waxing and waning intensity right lower quadrant abdominal pain.  It seems to have worsened in the past day or so.  She also endorses some right flank pain that began today.  Denies lower abdominal pain radiating through towards her back.  She endorses nausea with bloating but denies vomiting.  She has had some diarrhea.  No blood.  Reports intermittent dysuria but denies hematuria, frequency, urgency.  Has a remote history of UTIs but denies history of pyelonephritis.  No fevers but does report hot flashes.  No history of abdominal surgeries.  No recent travel.  No history of diverticulitis.  Cannot identify any alleviating factors to her abdominal pain.  States it is worse if she lays flat.  No OTC medications have been used.  Patient does have a history of diabetes.  States her blood sugar was 134 this morning.  No other concerns at this time.   Back Pain Associated symptoms: abdominal pain and dysuria   Abdominal Pain Associated symptoms: dysuria and nausea     Past Medical History:  Diagnosis Date   Asthma    extrinsic   CAD (coronary artery disease)    Chest pain, atypical    Complication of anesthesia    Diabetes ([place])    Edema    Fatigue    Fatigue    GERD (gastroesophageal reflux disease)    Hyperlipidemia    Hypertension    familiar   Hypothyroidism    IBS (irritable bowel syndrome)    hx   Keratosis, seborrheic    Labyrinthitis    MI (myocardial infarction) ([place])    age 76   Osteoarthritis    PONV (postoperative nausea and vomiting)    S/P arthroscopy of right shoulder 03/01/2018   Skin cancer    Melanoma     Patient Active Problem List    Diagnosis Date Noted   OA (osteoarthritis) of hip 11/12/2019   Primary osteoarthritis of right hip 11/12/2019   Palpitations 08/28/2018   Osteoporosis 12/25/2017   Coronary artery disease    Dysuria 02/17/2017   Diarrhea 02/17/2017   Vitamin D deficiency 11/07/2016   Chronic diastolic CHF (congestive heart failure) ([place]) 11/18/2015   Diabetes mellitus without complication ([place]) 0000000   Reactive airway disease with acute exacerbation 09/10/2014   Nonspecific (abnormal) findings on radiological and other examination of gastrointestinal tract 04/04/2012   Insomnia, idiopathic 10/04/2010   Hyperlipidemia 09/22/2009   Essential hypertension 09/22/2009   CAD, NATIVE VESSEL 09/22/2009   EDEMA 05/19/2009   Hypothyroidism 02/23/2009   EXTRINSIC ASTHMA, UNSPECIFIED 02/10/2009   KERATOSIS, SEBORRHEIC NEC 04/16/2007   SKIN CANCER, HX OF 02/14/2007   Depression 01/24/2007   MYOCARDIAL INFARCTION, HX OF 01/24/2007   GERD 01/24/2007   Osteoarthritis 01/24/2007   COLONIC POLYPS, HX OF 01/24/2007   IRRITABLE BOWEL SYNDROME, HX OF 01/24/2007    Past Surgical History:  Procedure Laterality Date   Breast Biopsy     dense breast tissue   BREAST EXCISIONAL BIOPSY Left 2000   benign   CHOLECYSTECTOMY     EUS  04/04/2012   Procedure: UPPER ENDOSCOPIC  ULTRASOUND (EUS) LINEAR;  Surgeon: Milus Banister, MD;  Location: [facility];  Service: Endoscopy;  Laterality: N/A;  radial linear   KNEE ARTHROPLASTY     total right  02-28-2010 Dr Gaynelle Arabian, left   KNEE ARTHROPLASTY Bilateral 09/12/2017   Hector Shade   LEFT HEART CATH AND CORONARY ANGIOGRAPHY N/A 04/20/2017   Procedure: LEFT HEART CATH AND CORONARY ANGIOGRAPHY;  Surgeon: Burnell Blanks, MD;  Location: [facility];  Service: Cardiovascular;  Laterality: N/A;   TOTAL HIP ARTHROPLASTY Right 11/12/2019   Procedure: TOTAL HIP ARTHROPLASTY ANTERIOR APPROACH;  Surgeon: Gaynelle Arabian, MD;  Location: [facility];  Service:  Orthopedics;  Laterality: Right;  120mn   TUBAL LIGATION      OB History   No obstetric history on file.      Home Medications    Prior to Admission medications   Medication Sig Start Date End Date Taking? Authorizing Provider  Accu-Chek FastClix Lancets MISC USE   TO CHECK GLUCOSE TWICE DAILY 11/14/18   Nafziger, CTommi Rumps NP  ACCU-CHEK GUIDE test strip USE TO TEST BLOOD GLUCOSE TWICE DAILY 11/30/21   Nafziger, CTommi Rumps NP  acetaminophen (TYLENOL) 325 MG tablet Take 650 mg by mouth every 6 (six) hours as needed for moderate pain.    [provider]  albuterol (VENTOLIN HFA) 108 (90 Base) MCG/ACT inhaler Inhale 2 puffs into the lungs every 6 (six) hours as needed for wheezing or shortness of breath. 12/14/21   Nafziger, CTommi Rumps NP  atorvastatin (LIPITOR) 80 MG tablet Take 1 tablet (80 mg total) by mouth daily. 12/29/21   Nafziger, CTommi Rumps NP  buPROPion (WELLBUTRIN XL) 300 MG 24 hr tablet TAKE 1 TABLET BY MOUTH DAILY 05/16/22   Nafziger, CTommi Rumps NP  carvedilol (COREG) 6.25 MG tablet Take 1 tablet (6.25 mg total) by mouth 2 (two) times daily with a meal. 05/16/22   PFreada Bergeron MD  conjugated estrogens (PREMARIN) vaginal cream Place 0.5 Applicatorfuls vaginally every 7 (seven) days. 12/19/19   Nafziger, CTommi Rumps NP  denosumab (PROLIA) 60 MG/ML SOSY injection Inject 60 mg into the skin every 6 (six) months.    [provider]  Diclofenac Sodium 1 % CREA Apply topically.    [provider]  empagliflozin (JARDIANCE) 25 MG TABS tablet Take 1 tablet (25 mg total) by mouth daily before breakfast. 12/29/21   Nafziger, CTommi Rumps NP  furosemide (LASIX) 20 MG tablet Take 1 tablet (20 mg total) by mouth 2 (two) times daily. 05/08/22   PFreada Bergeron MD  levothyroxine (SYNTHROID) 50 MCG tablet Take 1 tablet (50 mcg total) by mouth daily before breakfast. 12/30/21   Nafziger, CTommi Rumps NP  nitroGLYCERIN (NITROSTAT) 0.4 MG SL tablet DISSOLVE 1 TABLET UNDER THE TONGUE EVERY 5 MINUTES  AS&nbsp;&nbsp;NEEDED FOR CHEST PAIN. MAX&nbsp;&nbsp;3 DOSES IN 15 MINUTES. CALL 911 IF CHEST PAIN PERSISTS 12/19/19   Nafziger, CTommi Rumps NP  pioglitazone (ACTOS) 30 MG tablet TAKE 1 TABLET BY MOUTH DAILY 07/13/22   Nafziger, CTommi Rumps NP  potassium chloride SA (KLOR-CON M) 20 MEQ tablet Take 2 tablets (40 mEq total) by mouth daily. 05/08/22   PFreada Bergeron MD  spironolactone (ALDACTONE) 25 MG tablet Take 0.5 tablets (12.5 mg total) by mouth daily. 11/14/21   PFreada Bergeron MD    Family History Family History  Problem Relation Age of Onset   Heart disease Mother    Hypertension Mother    Heart attack Mother 444      [place]  Melan[place] Daughter 28       METASTATIC   Breast cancer Sister    Hypertension Father    Hypertension Sister     Social History Social History   Tobacco Use   Smoking status: Never   Smokeless tobacco: Never  Vaping Use   Vaping Use: Never used  Substance Use Topics   Alcohol use: No    Comment: rarely   Drug use: No     Allergies   Sulfa antibiotics and Sulfonamide derivatives   Review of Systems Review of Systems  Gastrointestinal:  Positive for abdominal pain and nausea.  Genitourinary:  Positive for dysuria.  Musculoskeletal:  Positive for back pain.     Physical Exam Triage Vital Signs ED Triage Vitals  Enc Vitals Group     BP 09/13/22 1225 (!) 155/87     Pulse Rate 09/13/22 1225 81     Resp 09/13/22 1225 18     Temp 09/13/22 1225 (!) 97.4 F (36.3 C)     Temp Source 09/13/22 1225 Oral     SpO2 09/13/22 1225 98 %     Weight --      Height --      Head Circumference --      Peak Flow --      Pain Score 09/13/22 1224 10     Pain Loc --      Pain Edu? --      Excl. in [place]? --    No data found.  Updated Vital Signs BP (!) 155/87 (BP Location: Right Arm)   Pulse 81   Temp (!) 97.4 F (36.3 C) (Oral)   Resp 18   SpO2 98%   Visual Acuity Right Eye Distance:   Left Eye Distance:   Bilateral Distance:     Right Eye Near:   Left Eye Near:    Bilateral Near:     Physical Exam Vitals and nursing note reviewed.  Constitutional:      Appearance: Normal appearance.     Comments: Patient appears uncomfortable but in no acute distress  HENT:     Head: Normocephalic and atraumatic.  Eyes:     Pupils: Pupils are equal, round, and reactive to light.  Cardiovascular:     Rate and Rhythm: Normal rate.  Pulmonary:     Effort: Pulmonary effort is normal.  Abdominal:     General: Bowel sounds are normal. There is no distension.     Palpations: Abdomen is soft.     Tenderness: There is abdominal tenderness in the right lower quadrant. There is right CVA tenderness. There is no left CVA tenderness. Negative signs include Rovsing's sign and McBurney's sign.  Skin:    General: Skin is warm and dry.  Neurological:     General: No focal deficit present.     Mental Status: She is alert and oriented to person, place, and time.  Psychiatric:        Mood and Affect: Mood normal.        Behavior: Behavior normal.      UC Treatments / Results  Labs (all labs ordered are listed, but only abnormal results are displayed) Labs Reviewed  POCT URINALYSIS DIP (MANUAL ENTRY) - Abnormal; Notable for the following components:      Result Value   Glucose, UA >=1,000 (*)    Ketones, POC UA small (15) (*)    Spec Grav, UA <=1.005 (*)    All other components within normal limits  EKG   Radiology No results found.  Procedures Procedures (including critical care time)  Medications Ordered in UC Medications - No data to display  Initial Impression / Assessment and Plan / UC Course  I have reviewed the triage vital signs and the nursing notes.  Pertinent labs & imaging results that were available during my care of the patient were reviewed by me and considered in my medical decision making (see chart for details).     Reviewed exam and symptoms with patient. UA negative for UTI. Large amount  of glucose in urine, point-of-care blood sugar was 137 Discussed limitations and abilities of urgent care.  Given her worsening right lower quadrant abdominal pain advise she go to the emergency room for further evaluation and treatment.  She is in agreement with plan and will go to [facility] emergency room.  Patient was discharged in stable condition and in no acute distress Final Clinical Impressions(s) / UC Diagnoses   Final diagnoses:  RLQ abdominal pain  Glucose found in urine on examination     Discharge Instructions      Please go to the emergency room for further evaluation and treatment of your abdominal pain     ED Prescriptions   None    PDMP not reviewed this encounter.   Melynda Ripple, NP 09/13/22 [phone]

## 2022-09-13 NOTE — ED Notes (Signed)
Pt given water to be able to leave urine sample.

## 2022-09-13 NOTE — Discharge Instructions (Addendum)
Return to ED with any new or worsening signs or symptoms Follow-up with GI as discussed.  Please call and make an appointment to be seen. Please continue pushing fluids to include water, Pedialyte, Body Armor. Please read attached guide for viral gastroenteritis Please begin taking Bentyl twice daily for the next days.  Please also begin taking Zofran every 6 hours as needed for nausea and vomiting

## 2022-09-13 NOTE — ED Notes (Signed)
Reviewed discharge instructions and recommendations with pt. Pt states understanding. Pt aware of scripts to pick up. Ambulatory at time of discharge

## 2022-10-10 ENCOUNTER — Other Ambulatory Visit: Payer: Self-pay | Admitting: *Deleted

## 2022-10-10 MED ORDER — POTASSIUM CHLORIDE CRYS ER 20 MEQ PO TBCR: 40.0000 meq | EXTENDED_RELEASE_TABLET | Freq: Every day | ORAL | 0 refills | Status: DC

## 2022-10-18 ENCOUNTER — Other Ambulatory Visit: Payer: Self-pay | Admitting: Adult Health

## 2022-10-18 DIAGNOSIS — E039 Hypothyroidism, unspecified: Secondary | ICD-10-CM

## 2022-10-19 ENCOUNTER — Other Ambulatory Visit: Payer: Self-pay | Admitting: Adult Health

## 2022-10-19 DIAGNOSIS — I2510 Atherosclerotic heart disease of native coronary artery without angina pectoris: Secondary | ICD-10-CM

## 2022-10-19 DIAGNOSIS — E782 Mixed hyperlipidemia: Secondary | ICD-10-CM

## 2022-10-19 NOTE — Telephone Encounter (Signed)
30 day refill placed. Pt is due for a dm f/u and will be due for Thyroid check in 2 months.

## 2022-11-01 ENCOUNTER — Telehealth: Payer: Self-pay | Admitting: *Deleted

## 2022-11-01 ENCOUNTER — Other Ambulatory Visit: Payer: Self-pay

## 2022-11-01 ENCOUNTER — Ambulatory Visit (INDEPENDENT_AMBULATORY_CARE_PROVIDER_SITE_OTHER): Payer: Medicare Other

## 2022-11-01 ENCOUNTER — Encounter: Payer: Self-pay | Admitting: *Deleted

## 2022-11-01 ENCOUNTER — Ambulatory Visit: Payer: Medicare Other | Attending: Cardiology | Admitting: Cardiology

## 2022-11-01 VITALS — BP 142/78 | HR 75 | Ht <= 58 in | Wt 140.2 lb

## 2022-11-01 DIAGNOSIS — I5032 Chronic diastolic (congestive) heart failure: Secondary | ICD-10-CM | POA: Insufficient documentation

## 2022-11-01 DIAGNOSIS — I10 Essential (primary) hypertension: Secondary | ICD-10-CM | POA: Diagnosis not present

## 2022-11-01 DIAGNOSIS — E785 Hyperlipidemia, unspecified: Secondary | ICD-10-CM | POA: Diagnosis not present

## 2022-11-01 DIAGNOSIS — I2510 Atherosclerotic heart disease of native coronary artery without angina pectoris: Secondary | ICD-10-CM

## 2022-11-01 DIAGNOSIS — R002 Palpitations: Secondary | ICD-10-CM | POA: Diagnosis not present

## 2022-11-01 DIAGNOSIS — R0602 Shortness of breath: Secondary | ICD-10-CM | POA: Insufficient documentation

## 2022-11-01 MED ORDER — PROPRANOLOL HCL 10 MG PO TABS: 10.0000 mg | ORAL_TABLET | Freq: Two times a day (BID) | ORAL | 1 refills | Status: DC | PRN

## 2022-11-01 MED ORDER — PROPRANOLOL HCL 10 MG PO TABS: 10.0000 mg | ORAL_TABLET | Freq: Two times a day (BID) | ORAL | 1 refills | Status: AC | PRN

## 2022-11-01 NOTE — Patient Instructions (Signed)
Medication Instructions:   START TAKING PROPRANOLOL 10 MG BY MOUTH TWICE DAILY AS NEEDED FOR PALPITATIONS  *If you need a refill on your cardiac medications before your next appointment, please call your pharmacy*   Testing/Procedures:  Your physician has requested that you have a lexiscan myoview. For further information please visit [email or web address] Please follow instruction sheet, as given.    ZIO XT- Long Term Monitor Instructions  Your physician has requested you wear a ZIO patch monitor for 7 days.  This is a single patch monitor. [employer] supplies one patch monitor per enrollment. Additional stickers are not available. Please do not apply patch if you will be having a Nuclear Stress Test,  Echocardiogram, Cardiac CT, MRI, or Chest Xray during the period you would be wearing the  monitor. The patch cannot be worn during these tests. You cannot remove and re-apply the  ZIO XT patch monitor.  Your ZIO patch monitor will be mailed 3 day USPS to your address on file. It may take 3-5 days  to receive your monitor after you have been enrolled.  Once you have received your monitor, please review the enclosed instructions. Your monitor  has already been registered assigning a specific monitor serial # to you.  Billing and Patient Assistance Program Information  We have supplied [employer] with any of your insurance information on file for billing purposes. [employer] offers a sliding scale Patient Assistance Program for patients that do not have  insurance, or whose insurance does not completely cover the cost of the ZIO monitor.  You must apply for the Patient Assistance Program to qualify for this discounted rate.  To apply, please call [employer] at [phone], select option 4, select option 2, ask to apply for  Patient Assistance Program. Meredeth Ide will ask your household income, and how many people  are in your household. They will quote your out-of-pocket cost based on that  information.  [employer] will also be able to set up a 99-month, interest-free payment plan if needed.  Applying the monitor   Shave hair from upper left chest.  Hold abrader disc by orange tab. Rub abrader in 40 strokes over the upper left chest as  indicated in your monitor instructions.  Clean area with 4 enclosed alcohol pads. Let dry.  Apply patch as indicated in monitor instructions. Patch will be placed under collarbone on left  side of chest with arrow pointing upward.  Rub patch adhesive wings for 2 minutes. Remove white label marked "1". Remove the white  label marked "2". Rub patch adhesive wings for 2 additional minutes.  While looking in a mirror, press and release button in center of patch. A small green light will  flash 3-4 times. This will be your only indicator that the monitor has been turned on.  Do not shower for the first 24 hours. You may shower after the first 24 hours.  Press the button if you feel a symptom. You will hear a small click. Record Date, Time and  Symptom in the Patient Logbook.  When you are ready to remove the patch, follow instructions on the last 2 pages of Patient  Logbook. Stick patch monitor onto the last page of Patient Logbook.  Place Patient Logbook in the blue and white box. Use locking tab on box and tape box closed  securely. The blue and white box has prepaid postage on it. Please place it in the mailbox as  soon as possible. Your physician should have your test results approximately 7  days after the  monitor has been mailed back to [place].  Call [facility] Customer Care at [phone] if you have questions regarding  your ZIO XT patch monitor. Call them immediately if you see an orange light blinking on your  monitor.  If your monitor falls off in less than 4 days, contact our Monitor department at [phone].  If your monitor becomes loose or falls off after 4 days call [employer] at [phone] for  suggestions on  securing your monitor    Follow-Up:  3 MONTHS WITH AN EXTENDER IN THE OFFICE

## 2022-11-01 NOTE — Progress Notes (Signed)
Cardiology Office Note:    Date:  11/01/2022   ID:  Rebecca [name], DOB Dec 12, 1945, MRN [id number]  PCP:  Shirline Frees, NP   [facility] Providers Cardiologist:  Tobias Alexander, MD Cardiology APP:  Dyann Kief, PA-C  {   Referring MD: Shirline Frees, NP    History of Present Illness:    Rebecca [name] is a 77 y.o. female with a hx of of CAD status post PCI to the RCA in 2010,  hyperlipidemia, HTN, chronic diastolic CHF who presents to clinic for follow-up. Was previously followed by by Dr. Delton See.  Per review of the record, patient has known CAD with RCA PCI in 2010. Repeat cath in 04/2017 with mild disease, patent RCA stent. Recommended for medical management. TTE 10/2020 with normal EF 60-65%, G1DD, RV okay, trivial MR, RAP .   Per report, the patient states that at 77 years old, she was working at a health fair with 2 nurses and notices she had to hold herself up against the table. She remembers feeling poorly and drove herself home and believes she lost consciousness in her bed. In the following 3 days, she went to work and continued to feel poorly. A doctor at her workplace gave her nitroglycerin and morphine and rushed her to the ER. She was found to have complete occlusion of the R coronary artery but was unable to undergo surgery at the time. Had subsequent PCI to RCA in 2010.  Was last seen in clinic on 04/2022 where she was complaining of LE edema. We recommended an increase in her diuretic dosing at that time.  Today, the patient overall feels okay. Has been under a lot of stress at home as her husband has was diagnosed with dementia and she has been diagnosed with severe spinal stenosis which is limiting her mobility. In this setting, she has noticed her BP has been fluctuating and she finds that those days she feels more tired and finds her legs are swollen. She has also had episodes of palpitations that occur intermittently throughout the day and night. No  sustained episodes but they have been bothersome. No chest pain, lightheadedness, or syncope. Has been tolerating her medications.    Past Medical History:  Diagnosis Date   Asthma    extrinsic   CAD (coronary artery disease)    Chest pain, atypical    Complication of anesthesia    Diabetes (HCC)    Edema    Fatigue    Fatigue    GERD (gastroesophageal reflux disease)    Hyperlipidemia    Hypertension    familiar   Hypothyroidism    IBS (irritable bowel syndrome)    hx   Keratosis, seborrheic    Labyrinthitis    MI (myocardial infarction) (HCC)    age 14   Osteoarthritis    PONV (postoperative nausea and vomiting)    S/P arthroscopy of right shoulder 03/01/2018   Skin cancer    Melanoma     Past Surgical History:  Procedure Laterality Date   Breast Biopsy     dense breast tissue   BREAST EXCISIONAL BIOPSY Left 2000   benign   CHOLECYSTECTOMY     EUS  04/04/2012   Procedure: UPPER ENDOSCOPIC ULTRASOUND (EUS) LINEAR;  Surgeon: Rachael Fee, MD;  Location: [facility];  Service: Endoscopy;  Laterality: N/A;  radial linear   KNEE ARTHROPLASTY     total right  02-28-2010 Dr Ollen Gross, left   KNEE ARTHROPLASTY Bilateral  09/12/2017   Trudee Grip   LEFT HEART CATH AND CORONARY ANGIOGRAPHY N/A 04/20/2017   Procedure: LEFT HEART CATH AND CORONARY ANGIOGRAPHY;  Surgeon: Kathleene Hazel, MD;  Location: [facility] INVASIVE CV [facility];  Service: Cardiovascular;  Laterality: N/A;   TOTAL HIP ARTHROPLASTY Right 11/12/2019   Procedure: TOTAL HIP ARTHROPLASTY ANTERIOR APPROACH;  Surgeon: Ollen Gross, MD;  Location: [facility] ORS;  Service: Orthopedics;  Laterality: Right;    TUBAL LIGATION      Current Medications: Current Meds  Medication Sig   Accu-Chek FastClix Lancets MISC USE   TO CHECK GLUCOSE TWICE DAILY   ACCU-CHEK GUIDE test strip USE TO TEST BLOOD GLUCOSE TWICE DAILY   acetaminophen (TYLENOL) 325 MG tablet Take 650 mg by mouth every 6 (six) hours as needed for  moderate pain.   albuterol (VENTOLIN HFA) 108 (90 Base) MCG/ACT inhaler Inhale 2 puffs into the lungs every 6 (six) hours as needed for wheezing or shortness of breath.   atorvastatin (LIPITOR) 80 MG tablet TAKE 1 TABLET BY MOUTH DAILY   buPROPion (WELLBUTRIN XL) 300 MG 24 hr tablet TAKE 1 TABLET BY MOUTH DAILY   carvedilol (COREG) 6.25 MG tablet Take 1 tablet (6.25 mg total) by mouth 2 (two) times daily with a meal.   conjugated estrogens (PREMARIN) vaginal cream Place 0.5 Applicatorfuls vaginally every 7 (seven) days.   denosumab (PROLIA) 60 MG/ML SOSY injection Inject 60 mg into the skin every 6 (six) months.   Diclofenac Sodium 1 % CREA Apply topically.   empagliflozin (JARDIANCE) 25 MG TABS tablet Take 1 tablet (25 mg total) by mouth daily before breakfast.   furosemide (LASIX) 20 MG tablet Take 1 tablet (20 mg total) by mouth 2 (two) times daily.   levothyroxine (SYNTHROID) 50 MCG tablet TAKE 1 TABLET BY MOUTH DAILY  BEFORE BREAKFAST   nitroGLYCERIN (NITROSTAT) 0.4 MG SL tablet DISSOLVE 1 TABLET UNDER THE TONGUE EVERY 5 MINUTES AS&nbsp;&nbsp;NEEDED FOR CHEST PAIN. MAX&nbsp;&nbsp;3 DOSES IN 15 MINUTES. CALL 911 IF CHEST PAIN PERSISTS   pioglitazone (ACTOS) 30 MG tablet TAKE 1 TABLET BY MOUTH DAILY   potassium chloride SA (KLOR-CON M) 20 MEQ tablet Take 2 tablets (40 mEq total) by mouth daily.   propranolol (INDERAL) 10 MG tablet Take 1 tablet (10 mg total) by mouth 2 (two) times daily as needed (palpitations).   spironolactone (ALDACTONE) 25 MG tablet Take 0.5 tablets (12.5 mg total) by mouth daily.     Allergies:   Sulfa antibiotics and Sulfonamide derivatives   Social History   Socioeconomic History   Marital status: Married    Spouse name: Not on file   Number of children: 2   Years of education: Not on file   Highest education level: Not on file  Occupational History   Occupation: RN-retired    Employer: RETIRED  Tobacco Use   Smoking status: Never   Smokeless tobacco: Never   Vaping Use   Vaping Use: Never used  Substance and Sexual Activity   Alcohol use: No    Comment: rarely   Drug use: No   Sexual activity: Not on file  Other Topics Concern   Not on file  Social History Narrative   ** Merged History Encounter **       Social Determinants of Health   Financial Resource Strain: Low Risk  (04/26/2022)   Overall Financial Resource Strain (CARDIA)    Difficulty of Paying Living Expenses: Not hard at all  Food Insecurity: No Food Insecurity (04/26/2022)  Hunger Vital Sign    Worried About Running Out of Food in the Last Year: Never true    Ran Out of Food in the Last Year: Never true  Transportation Needs: No Transportation Needs (04/26/2022)   PRAPARE - Administrator, Civil Service (Medical): No    Lack of Transportation (Non-Medical): No  Physical Activity: Insufficiently Active (04/26/2022)   Exercise Vital Sign    Days of Exercise per Week: 2 days    Minutes of Exercise per Session: 60 min  Stress: No Stress Concern Present (04/26/2022)   [employer] of Occupational Health - Occupational Stress Questionnaire    Feeling of Stress : Not at all  Social Connections: Socially Integrated (04/26/2022)   Social Connection and Isolation Panel [NHANES]    Frequency of Communication with Friends and Family: More than three times a week    Frequency of Social Gatherings with Friends and Family: More than three times a week    Attends Religious Services: More than 4 times per year    Active Member of [employer] or Organizations: Yes    Attends Engineer, structural: More than 4 times per year    Marital Status: Married     Family History: The patient's family history includes Breast cancer in her sister; Heart attack (age of onset: 26) in her mother; Heart disease in her mother; Hypertension in her father, mother, and sister; Melanoma (age of onset: 12) in her daughter.  ROS:   Please see the history of present illness.     Review of Systems  Constitutional:  Positive for malaise/fatigue. Negative for weight loss.  HENT:  Negative for congestion and sore throat.   Eyes:  Negative for blurred vision.  Respiratory:  Positive for shortness of breath. Negative for cough and sputum production.   Cardiovascular:  Positive for palpitations. Negative for chest pain, orthopnea, claudication, leg swelling and PND.  Gastrointestinal:  Negative for heartburn and nausea.  Genitourinary:  Negative for frequency and urgency.  Musculoskeletal:  Positive for back pain, joint pain (Right hip), myalgias (RLE) and neck pain.  Skin:  Negative for itching.  Neurological:  Positive for headaches. Negative for dizziness and loss of consciousness.  Endo/Heme/Allergies:  Does not bruise/bleed easily.  Psychiatric/Behavioral:  The patient is not nervous/anxious and does not have insomnia.    All other systems reviewed and are negative.  EKGs/Labs/Other Studies Reviewed:    The following studies were reviewed today:  TTE 10/2020: IMPRESSIONS:  1. Left ventricular ejection fraction, by estimation, is 60 to 65%. Left  ventricular ejection fraction by 3D volume is 66 %. The left ventricle has  normal function. The left ventricle has no regional wall motion  abnormalities. Left ventricular diastolic   parameters are consistent with Grade I diastolic dysfunction (impaired  relaxation).   2. Right ventricular systolic function is normal. The right ventricular  size is normal.   3. The mitral valve is normal in structure. Trivial mitral valve  regurgitation. No evidence of mitral stenosis.   4. The aortic valve is tricuspid. Aortic valve regurgitation is not  visualized. No aortic stenosis is present.   5. The inferior vena cava is normal in size with greater than 50%  respiratory variability, suggesting right atrial pressure of 3 mmHg.   Cardiac Monitor 02/20: Sinus bradycardia to sinus tachycardia. Infrequent PACs, PVCs. Few  very short SVT runs.   Sinus bradycardia to sinus tachycardia. Infrequent PACs, PVCs. Few very short SVT runs.  LHC 04/2017:  Prox RCA to Mid RCA lesion, 0 %stenosed. Prox RCA lesion, 40 %stenosed. Ost LAD to Prox LAD lesion, 20 %stenosed. Prox LAD to Mid LAD lesion, 30 %stenosed. The left ventricular systolic function is normal. LV end diastolic pressure is normal. The left ventricular ejection fraction is greater than 65% by visual estimate. There is no mitral valve regurgitation.   1. Single vessel CAD with patent stent RCA. The RCA stent has mild to moderate restenosis. This does not appear to be flow limiting.  2. Mild disease LAD 3. Normal LV systolic function   Recommendations: Continue medical management of CAD.     EKG:  EKG is personally reviewed. NSR, inferior q (chronic), HR 71bpm  Recent Labs: 12/29/2021: TSH 1.35 05/04/2022: NT-Pro BNP 64 09/13/2022: ALT 24; BUN 28; Creatinine, Ser 1.00; Hemoglobin 14.4; Platelets 247; Potassium 4.2; Sodium 132   Recent Lipid Panel    Component Value Date/Time   CHOL 145 12/29/2021 1102   CHOL 155 10/13/2020 0901   TRIG 84.0 12/29/2021 1102   HDL 63.70 12/29/2021 1102   HDL 60 10/13/2020 0901   CHOLHDL 2 12/29/2021 1102   VLDL 16.8 12/29/2021 1102   LDLCALC 65 12/29/2021 1102   LDLCALC 78 10/13/2020 0901     Risk Assessment/Calculations:           Physical Exam:    VS:  BP (!) 142/78   Pulse 75   Ht 4\' 10"  (1.473 m)   Wt 140 lb 3.2 oz (63.6 kg)   SpO2 97%   BMI 29.30 kg/m     Wt Readings from Last 3 Encounters:  11/01/22 140 lb 3.2 oz (63.6 kg)  09/13/22 138 lb (62.6 kg)  05/18/22 141 lb (64 kg)     GEN: Well nourished, well developed in no acute distress HEENT: Normal NECK: No JVD; No carotid bruits CARDIAC: RRR, 1/6 systolic murmur. No rubs or gallops RESPIRATORY:  Clear to auscultation without rales, wheezing or rhonchi  ABDOMEN: Soft, non-tender, non-distended MUSCULOSKELETAL:  No LE edema,  warm SKIN: Warm and dry NEUROLOGIC:  Alert and oriented x 3 PSYCHIATRIC:  Normal affect   ASSESSMENT:    1. Coronary artery disease involving native coronary artery of native heart without angina pectoris   2. Palpitations   3. Essential hypertension   4. Hyperlipidemia, unspecified hyperlipidemia type   5. Chronic diastolic CHF (congestive heart failure)   6. SOB (shortness of breath)     PLAN:    In order of problems listed above:  #CAD s/p PCI to RCA: Underwent RCA PCI in 2010. Cath 04/2017 with nonobstructive CAD and patent RCA stent. TTE with normal BiV function. Currently having worsening fatigue with exertion. Will check myoview for further evaluation. -Check myoview -Continue lipitor 80mg  daily -Continue ASA 81mg  daily  #Chronic Diastolic HF: TTE 16/1096 with normal BiV function, LVEF 60-65%, G1DD, no significant valve disease. Currently compensated and euvolemic on exam. -Continue lasix 20mg  BID -Continue spiro 12.5mg  daily -Continue jardiance 25mg  daily (for DMII) -Low Na diet  #Palpitations: 2-week monitor 09/04/2018 sinus bradycardia to sinus tachycardia with infrequent PACs, PVCs and very short runs of SVT. Currently with increased ectopy in the setting of stress. Will repeat zio for monitoring. -Check 7 day zio -Continue coreg 6.25mg  BID -Start propranolol 10mg  BID for breakthrough symptoms  #DMII: -A1C 7.4 -Managed by PCP -On jardiance 25mg  daily -Could consider ozempic     Follow-up:  6 months.  Medication Adjustments/Labs and Tests Ordered: Current medicines are reviewed at length with  the patient today.  Concerns regarding medicines are outlined above.   Orders Placed This Encounter  Procedures   Cardiac Stress Test: Informed Consent Details: Physician/Practitioner Attestation; Transcribe to consent form and obtain patient signature   LONG TERM MONITOR (3-14 DAYS)   MYOCARDIAL PERFUSION IMAGING   EKG 12-Lead   Meds ordered this encounter   Medications   propranolol (INDERAL) 10 MG tablet    Sig: Take 1 tablet (10 mg total) by mouth 2 (two) times daily as needed (palpitations).    Dispense:  60 tablet    Refill:  1   Patient Instructions  Medication Instructions:   START TAKING PROPRANOLOL 10 MG BY MOUTH TWICE DAILY AS NEEDED FOR PALPITATIONS  *If you need a refill on your cardiac medications before your next appointment, please call your pharmacy*   Testing/Procedures:  Your physician has requested that you have a lexiscan myoview. For further information please visit [email or web address] Please follow instruction sheet, as given.    ZIO XT- Long Term Monitor Instructions  Your physician has requested you wear a ZIO patch monitor for 7 days.  This is a single patch monitor. [employer] supplies one patch monitor per enrollment. Additional stickers are not available. Please do not apply patch if you will be having a Nuclear Stress Test,  Echocardiogram, Cardiac CT, MRI, or Chest Xray during the period you would be wearing the  monitor. The patch cannot be worn during these tests. You cannot remove and re-apply the  ZIO XT patch monitor.  Your ZIO patch monitor will be mailed 3 day USPS to your address on file. It may take 3-5 days  to receive your monitor after you have been enrolled.  Once you have received your monitor, please review the enclosed instructions. Your monitor  has already been registered assigning a specific monitor serial # to you.  Billing and Patient Assistance Program Information  We have supplied [employer] with any of your insurance information on file for billing purposes. [employer] offers a sliding scale Patient Assistance Program for patients that do not have  insurance, or whose insurance does not completely cover the cost of the ZIO monitor.  You must apply for the Patient Assistance Program to qualify for this discounted rate.  To apply, please call [employer] at [phone], select option 4,  select option 2, ask to apply for  Patient Assistance Program. Meredeth Ide will ask your household income, and how many people  are in your household. They will quote your out-of-pocket cost based on that information.  [employer] will also be able to set up a 55-month, interest-free payment plan if needed.  Applying the monitor   Shave hair from upper left chest.  Hold abrader disc by orange tab. Rub abrader in 40 strokes over the upper left chest as  indicated in your monitor instructions.  Clean area with 4 enclosed alcohol pads. Let dry.  Apply patch as indicated in monitor instructions. Patch will be placed under collarbone on left  side of chest with arrow pointing upward.  Rub patch adhesive wings for 2 minutes. Remove white label marked "1". Remove the white  label marked "2". Rub patch adhesive wings for 2 additional minutes.  While looking in a mirror, press and release button in center of patch. A small green light will  flash 3-4 times. This will be your only indicator that the monitor has been turned on.  Do not shower for the first 24 hours. You may shower after the first 24 hours.  Press the button if you feel a symptom. You will hear a small click. Record Date, Time and  Symptom in the Patient Logbook.  When you are ready to remove the patch, follow instructions on the last 2 pages of Patient  Logbook. Stick patch monitor onto the last page of Patient Logbook.  Place Patient Logbook in the blue and white box. Use locking tab on box and tape box closed  securely. The blue and white box has prepaid postage on it. Please place it in the mailbox as  soon as possible. Your physician should have your test results approximately 7 days after the  monitor has been mailed back to [facility].  Call [facility] Customer Care at [phone] if you have questions regarding  your ZIO XT patch monitor. Call them immediately if you see an orange light blinking on your  monitor.  If your  monitor falls off in less than 4 days, contact our Monitor department at [phone].  If your monitor becomes loose or falls off after 4 days call [employer] at [phone] for  suggestions on securing your monitor    Follow-Up:  3 MONTHS WITH AN EXTENDER IN THE OFFICE      Signed, Meriam Sprague, MD  11/01/2022 12:12 PM    [facility]

## 2022-11-01 NOTE — Telephone Encounter (Signed)
-----   Message from Flavia Shipper sent at 11/01/2022 12:01 PM EDT ----- Regarding: RE: 7 DAY ZIO PER DR. Shari Prows done ----- Message ----- From: Loa Socks, LPN Sent: 2/95/6213  11:50 AM EDT To: Ernst Bowler; Katrina Claria Dice Subject: 7 DAY ZIO PER DR. Shari Prows                    7 day zio ordered for palpitations  Please enroll and let me know when you do.  Thanks [employer]

## 2022-11-01 NOTE — Progress Notes (Unsigned)
Enrolled patient for a 7 day Zio XT monitor to be mailed to patients home.  

## 2022-11-02 ENCOUNTER — Telehealth (HOSPITAL_COMMUNITY): Payer: Self-pay | Admitting: *Deleted

## 2022-11-02 NOTE — Telephone Encounter (Signed)
Patient given detailed instructions per Myocardial Perfusion Study Information Sheet for the test on 11/08/2022 at 10:15. Patient notified to arrive 15 minutes early and that it is imperative to arrive on time for appointment to keep from having the test rescheduled.  If you need to cancel or reschedule your appointment, please call the office within 24 hours of your appointment. . Patient verbalized understanding.Daneil Dolin

## 2022-11-08 ENCOUNTER — Ambulatory Visit (HOSPITAL_COMMUNITY): Payer: Medicare Other | Attending: Cardiovascular Disease

## 2022-11-08 DIAGNOSIS — R002 Palpitations: Secondary | ICD-10-CM | POA: Insufficient documentation

## 2022-11-08 DIAGNOSIS — I10 Essential (primary) hypertension: Secondary | ICD-10-CM | POA: Diagnosis not present

## 2022-11-08 DIAGNOSIS — R0602 Shortness of breath: Secondary | ICD-10-CM | POA: Insufficient documentation

## 2022-11-08 DIAGNOSIS — E785 Hyperlipidemia, unspecified: Secondary | ICD-10-CM | POA: Insufficient documentation

## 2022-11-08 DIAGNOSIS — I2510 Atherosclerotic heart disease of native coronary artery without angina pectoris: Secondary | ICD-10-CM | POA: Diagnosis not present

## 2022-11-08 DIAGNOSIS — I5032 Chronic diastolic (congestive) heart failure: Secondary | ICD-10-CM | POA: Diagnosis not present

## 2022-11-08 LAB — MYOCARDIAL PERFUSION IMAGING
LV dias vol: 55 mL (ref 46–106)
LV sys vol: 17 mL
Nuc Stress EF: 69 %
Peak HR: 96 {beats}/min
Rest HR: 63 {beats}/min
Rest Nuclear Isotope Dose: 10.9 mCi
SDS: 7
SRS: 1
SSS: 8
ST Depression (mm): 0 mm
Stress Nuclear Isotope Dose: 31.1 mCi
TID: 0.95

## 2022-11-08 MED ORDER — TECHNETIUM TC 99M TETROFOSMIN IV KIT
10.9000 | PACK | Freq: Once | INTRAVENOUS | Status: AC | PRN
  Administered 2022-11-08: 10.9 via INTRAVENOUS

## 2022-11-08 MED ORDER — REGADENOSON 0.4 MG/5ML IV SOLN
0.4000 mg | Freq: Once | INTRAVENOUS | Status: AC
  Administered 2022-11-08: 0.4 mg via INTRAVENOUS

## 2022-11-08 MED ORDER — TECHNETIUM TC 99M TETROFOSMIN IV KIT
31.1000 | PACK | Freq: Once | INTRAVENOUS | Status: AC | PRN
  Administered 2022-11-08: 31.1 via INTRAVENOUS

## 2022-11-09 ENCOUNTER — Other Ambulatory Visit: Payer: Self-pay | Admitting: Adult Health

## 2022-11-09 DIAGNOSIS — E039 Hypothyroidism, unspecified: Secondary | ICD-10-CM

## 2022-11-22 ENCOUNTER — Telehealth: Payer: Self-pay

## 2022-11-22 NOTE — Telephone Encounter (Signed)
I left a message with patient's husband for her to call back to schedule her Prolia injection.  Okay to schedule at any time - estimated cost is $0.

## 2022-11-23 NOTE — Telephone Encounter (Signed)
Pt has been sch for 11-27-2022

## 2022-11-27 ENCOUNTER — Ambulatory Visit (INDEPENDENT_AMBULATORY_CARE_PROVIDER_SITE_OTHER): Payer: Medicare Other

## 2022-11-27 DIAGNOSIS — M810 Age-related osteoporosis without current pathological fracture: Secondary | ICD-10-CM | POA: Diagnosis not present

## 2022-11-27 MED ORDER — DENOSUMAB 60 MG/ML ~~LOC~~ SOSY
60.0000 mg | PREFILLED_SYRINGE | Freq: Once | SUBCUTANEOUS | Status: AC
  Administered 2022-11-27: 60 mg via SUBCUTANEOUS

## 2022-11-27 NOTE — Progress Notes (Signed)
Per orders of Dr. Clent Ridges, injection of Denosumab 60 mg/ml given by Addalee Kavanagh L Docie Abramovich. Patient tolerated injection well.

## 2022-11-30 ENCOUNTER — Other Ambulatory Visit: Payer: Self-pay | Admitting: Adult Health

## 2022-11-30 DIAGNOSIS — E119 Type 2 diabetes mellitus without complications: Secondary | ICD-10-CM

## 2022-12-02 DIAGNOSIS — R002 Palpitations: Secondary | ICD-10-CM | POA: Diagnosis not present

## 2022-12-12 ENCOUNTER — Other Ambulatory Visit: Payer: Self-pay | Admitting: *Deleted

## 2022-12-12 ENCOUNTER — Other Ambulatory Visit: Payer: Self-pay

## 2022-12-12 MED ORDER — POTASSIUM CHLORIDE CRYS ER 20 MEQ PO TBCR: 40.0000 meq | EXTENDED_RELEASE_TABLET | Freq: Every day | ORAL | 3 refills | Status: AC

## 2022-12-12 MED ORDER — POTASSIUM CHLORIDE CRYS ER 20 MEQ PO TBCR: 40.0000 meq | EXTENDED_RELEASE_TABLET | Freq: Every day | ORAL | 3 refills | Status: DC

## 2022-12-25 ENCOUNTER — Ambulatory Visit: Payer: Medicare Other | Admitting: Cardiology

## 2023-01-03 ENCOUNTER — Encounter: Payer: Self-pay | Admitting: Adult Health

## 2023-01-03 ENCOUNTER — Other Ambulatory Visit: Payer: Self-pay

## 2023-01-03 ENCOUNTER — Ambulatory Visit (INDEPENDENT_AMBULATORY_CARE_PROVIDER_SITE_OTHER): Payer: Medicare Other | Admitting: Adult Health

## 2023-01-03 VITALS — BP 110/60 | HR 70 | Temp 98.1°F | Ht <= 58 in | Wt 135.0 lb

## 2023-01-03 DIAGNOSIS — Z7984 Long term (current) use of oral hypoglycemic drugs: Secondary | ICD-10-CM

## 2023-01-03 DIAGNOSIS — E119 Type 2 diabetes mellitus without complications: Secondary | ICD-10-CM

## 2023-01-03 DIAGNOSIS — I5032 Chronic diastolic (congestive) heart failure: Secondary | ICD-10-CM

## 2023-01-03 DIAGNOSIS — M810 Age-related osteoporosis without current pathological fracture: Secondary | ICD-10-CM

## 2023-01-03 DIAGNOSIS — G4700 Insomnia, unspecified: Secondary | ICD-10-CM

## 2023-01-03 DIAGNOSIS — F419 Anxiety disorder, unspecified: Secondary | ICD-10-CM | POA: Diagnosis not present

## 2023-01-03 DIAGNOSIS — E039 Hypothyroidism, unspecified: Secondary | ICD-10-CM

## 2023-01-03 DIAGNOSIS — I2510 Atherosclerotic heart disease of native coronary artery without angina pectoris: Secondary | ICD-10-CM

## 2023-01-03 DIAGNOSIS — E782 Mixed hyperlipidemia: Secondary | ICD-10-CM | POA: Diagnosis not present

## 2023-01-03 DIAGNOSIS — F32A Depression, unspecified: Secondary | ICD-10-CM | POA: Diagnosis not present

## 2023-01-03 LAB — CBC WITH DIFFERENTIAL/PLATELET
Basophils Absolute: 0 10*3/uL (ref 0.0–0.1)
Basophils Relative: 0.3 % (ref 0.0–3.0)
Eosinophils Absolute: 0.1 10*3/uL (ref 0.0–0.7)
Eosinophils Relative: 1 % (ref 0.0–5.0)
HCT: 44 % (ref 36.0–46.0)
Hemoglobin: 14.4 g/dL (ref 12.0–15.0)
Lymphocytes Relative: 26.3 % (ref 12.0–46.0)
Lymphs Abs: 1.6 10*3/uL (ref 0.7–4.0)
MCHC: 32.7 g/dL (ref 30.0–36.0)
MCV: 95 fl (ref 78.0–100.0)
Monocytes Absolute: 0.4 10*3/uL (ref 0.1–1.0)
Monocytes Relative: 6.7 % (ref 3.0–12.0)
Neutro Abs: 3.9 10*3/uL (ref 1.4–7.7)
Neutrophils Relative %: 65.7 % (ref 43.0–77.0)
Platelets: 302 10*3/uL (ref 150.0–400.0)
RBC: 4.63 Mil/uL (ref 3.87–5.11)
RDW: 14.2 % (ref 11.5–15.5)
WBC: 5.9 10*3/uL (ref 4.0–10.5)

## 2023-01-03 LAB — VITAMIN D 25 HYDROXY (VIT D DEFICIENCY, FRACTURES): VITD: 67.36 ng/mL (ref 30.00–100.00)

## 2023-01-03 LAB — HEMOGLOBIN A1C: Hgb A1c MFr Bld: 7.4 % — ABNORMAL HIGH (ref 4.6–6.5)

## 2023-01-03 LAB — COMPREHENSIVE METABOLIC PANEL
ALT: 30 U/L (ref 0–35)
AST: 20 U/L (ref 0–37)
Albumin: 4.4 g/dL (ref 3.5–5.2)
Alkaline Phosphatase: 65 U/L (ref 39–117)
BUN: 23 mg/dL (ref 6–23)
CO2: 28 mEq/L (ref 19–32)
Calcium: 9.8 mg/dL (ref 8.4–10.5)
Chloride: 103 mEq/L (ref 96–112)
Creatinine, Ser: 0.9 mg/dL (ref 0.40–1.20)
GFR: 61.15 mL/min (ref >60.00)
Glucose, Bld: 149 mg/dL — ABNORMAL HIGH (ref 70–99)
Potassium: 4.5 mEq/L (ref 3.5–5.1)
Sodium: 139 mEq/L (ref 135–145)
Total Bilirubin: 0.5 mg/dL (ref 0.2–1.2)
Total Protein: 7 g/dL (ref 6.0–8.3)

## 2023-01-03 LAB — LIPID PANEL
Cholesterol: 143 mg/dL (ref 0–200)
HDL: 55.2 mg/dL (ref >39.00)
LDL Cholesterol: 67 mg/dL (ref 0–99)
NonHDL: 87.78
Total CHOL/HDL Ratio: 3
Triglycerides: 102 mg/dL (ref 0.0–149.0)
VLDL: 20.4 mg/dL (ref 0.0–40.0)

## 2023-01-03 LAB — TSH: TSH: 1.61 u[IU]/mL (ref 0.35–5.50)

## 2023-01-03 LAB — MICROALBUMIN / CREATININE URINE RATIO
Creatinine,U: 39.5 mg/dL
Microalb Creat Ratio: 1.8 mg/g (ref 0.0–30.0)
Microalb, Ur: <0.7 mg/dL (ref 0.0–1.9)

## 2023-01-03 MED ORDER — ESZOPICLONE 1 MG PO TABS
1.0000 mg | ORAL_TABLET | Freq: Every evening | ORAL | 2 refills | Status: AC | PRN
Start: 2023-01-03 — End: ?

## 2023-01-03 MED ORDER — SPIRONOLACTONE 25 MG PO TABS: 12.5000 mg | ORAL_TABLET | Freq: Every day | ORAL | 2 refills | Status: DC

## 2023-01-03 MED ORDER — AMOXICILLIN 500 MG PO CAPS
2000.0000 mg | ORAL_CAPSULE | Freq: Once | ORAL | 1 refills | Status: AC
Start: 2023-01-03 — End: 2023-01-03

## 2023-01-03 NOTE — Patient Instructions (Signed)
It was great seeing you today   We will follow up with you regarding your lab work   Please let me know if you need anything   

## 2023-01-03 NOTE — Progress Notes (Signed)
Subjective:    Patient ID: Rebecca [name], female    DOB: [birth year], 77 y.o.   MRN: [id number]  HPI Patient presents for yearly preventative medicine examination. She is a pleasant 77 year old female who  has a past medical history of Asthma, CAD (coronary artery disease), Chest pain, atypical, Complication of anesthesia, Diabetes (HCC), Edema, Fatigue, Fatigue, GERD (gastroesophageal reflux disease), Hyperlipidemia, Hypertension, Hypothyroidism, IBS (irritable bowel syndrome), Keratosis, seborrheic, Labyrinthitis, MI (myocardial infarction) (HCC), Osteoarthritis, PONV (postoperative nausea and vomiting), S/P arthroscopy of right shoulder (03/01/2018), and Skin cancer.  DM Type 2 - currently managed with Jardiance 25 mg daily and actos 30 mg daily.  She does monitor her blood sugars at home with readings in the 120-150's for the most part. She has not had any low blood sugars. She has not been able to exercise as much due to chronic back pain.   Lab Results  Component Value Date   HGBA1C 7.2 (A) 05/18/2022   CAD-status post PCI to RCA in 2010.  Her last cath in 2018 showed nonobstructive CAD with normal LVEF.  Currently managed by cardiology with Lipitor 80 mg daily.  She denies chest pain or shortness of breath. Lab Results  Component Value Date   CHOL 145 12/29/2021   HDL 63.70 12/29/2021   LDLCALC 65 12/29/2021   TRIG 84.0 12/29/2021   CHOLHDL 2 12/29/2021   Palpitations -managed by cardiology with diltiazem 240 mg daily.  Diastolic CHF-prescribed Lasix 20 mg BID and Spirolactone 12.5 mg daily.  She denies shortness of breath, chest pain, or lower extremity edema  Hypothyroidism-controlled with Synthroid 50 mcg daily. She does report that her hair is thinning and she feels fatigued  Lab Results  Component Value Date   TSH 1.35 12/29/2021   Insomnia -Managed she stopped Lunesta due not wanting to be on so much medication. She wants to go back on this medication as she is not  sleeping well.   Anxiety and Depression -most of her anxiety and depression stems from her husband's memory loss.Currently prescribed Wellbutrin 300 mg extended release.  She does feel as though this controls her symptoms well.  She will use Xanax 0.5 mg as needed  Osteoporosis -last bone density screen and October 2022 showed improvement in bone density.  Score at this time was -2.3.  Does take Prolia every 6 months.   All immunizations and health maintenance protocols were reviewed with the patient and needed orders were placed.  Appropriate screening laboratory values were ordered for the patient including screening of hyperlipidemia, renal function and hepatic function.   Medication reconciliation,  past medical history, social history, problem list and allergies were reviewed in detail with the patient  Goals were established with regard to weight loss, exercise, and  diet in compliance with medications Wt Readings from Last 3 Encounters:  01/03/23 135 lb (61.2 kg)  11/01/22 140 lb 3.2 oz (63.6 kg)  09/13/22 138 lb (62.6 kg)    Review of Systems  Constitutional: Negative.   HENT: Negative.    Eyes: Negative.   Respiratory: Negative.    Cardiovascular: Negative.   Gastrointestinal: Negative.   Endocrine: Negative.   Genitourinary: Negative.   Musculoskeletal:  Positive for arthralgias, back pain and gait problem.  Skin: Negative.   Allergic/Immunologic: Negative.   Hematological: Negative.   Psychiatric/Behavioral: Negative.     Past Medical History:  Diagnosis Date   Asthma    extrinsic   CAD (coronary artery disease)    Chest  pain, atypical    Complication of anesthesia    Diabetes (HCC)    Edema    Fatigue    Fatigue    GERD (gastroesophageal reflux disease)    Hyperlipidemia    Hypertension    familiar   Hypothyroidism    IBS (irritable bowel syndrome)    hx   Keratosis, seborrheic    Labyrinthitis    MI (myocardial infarction) (HCC)    age 45    Osteoarthritis    PONV (postoperative nausea and vomiting)    S/P arthroscopy of right shoulder 03/01/2018   Skin cancer    Melanoma     Social History   Socioeconomic History   Marital status: Married    Spouse name: Not on file   Number of children: 2   Years of education: Not on file   Highest education level: Not on file  Occupational History   Occupation: RN-retired    Employer: RETIRED  Tobacco Use   Smoking status: Never   Smokeless tobacco: Never  Vaping Use   Vaping Use: Never used  Substance and Sexual Activity   Alcohol use: No    Comment: rarely   Drug use: No   Sexual activity: Not on file  Other Topics Concern   Not on file  Social History Narrative   ** Merged History Encounter **       Social Determinants of Health   Financial Resource Strain: Low Risk  (04/26/2022)   Overall Financial Resource Strain (CARDIA)    Difficulty of Paying Living Expenses: Not hard at all  Food Insecurity: No Food Insecurity (04/26/2022)   Hunger Vital Sign    Worried About Running Out of Food in the Last Year: Never true    Ran Out of Food in the Last Year: Never true  Transportation Needs: No Transportation Needs (04/26/2022)   PRAPARE - Administrator, Civil Service (Medical): No    Lack of Transportation (Non-Medical): No  Physical Activity: Insufficiently Active (04/26/2022)   Exercise Vital Sign    Days of Exercise per Week: 2 days    Minutes of Exercise per Session: 60 min  Stress: No Stress Concern Present (04/26/2022)   [employer] of Occupational Health - Occupational Stress Questionnaire    Feeling of Stress : Not at all  Social Connections: Socially Integrated (04/26/2022)   Social Connection and Isolation Panel [NHANES]    Frequency of Communication with Friends and Family: More than three times a week    Frequency of Social Gatherings with Friends and Family: More than three times a week    Attends Religious Services: More than 4 times  per year    Active Member of Clubs or Organizations: Yes    Attends Banker Meetings: More than 4 times per year    Marital Status: Married  Catering manager Violence: Not At Risk (04/26/2022)   Humiliation, Afraid, Rape, and Kick questionnaire    Fear of Current or Ex-Partner: No    Emotionally Abused: No    Physically Abused: No    Sexually Abused: No    Past Surgical History:  Procedure Laterality Date   Breast Biopsy     dense breast tissue   BREAST EXCISIONAL BIOPSY Left 2000   benign   CHOLECYSTECTOMY     EUS  04/04/2012   Procedure: UPPER ENDOSCOPIC ULTRASOUND (EUS) LINEAR;  Surgeon: Rachael Fee, MD;  Location: [facility];  Service: Endoscopy;  Laterality: N/A;  radial linear  KNEE ARTHROPLASTY     total right  02-28-2010 Dr Ollen Gross, left   KNEE ARTHROPLASTY Bilateral 09/12/2017   Trudee Grip   LEFT HEART CATH AND CORONARY ANGIOGRAPHY N/A 04/20/2017   Procedure: LEFT HEART CATH AND CORONARY ANGIOGRAPHY;  Surgeon: Kathleene Hazel, MD;  Location: [facility];  Service: Cardiovascular;  Laterality: N/A;   TOTAL HIP ARTHROPLASTY Right 11/12/2019   Procedure: TOTAL HIP ARTHROPLASTY ANTERIOR APPROACH;  Surgeon: Ollen Gross, MD;  Location: [facility];  Service: Orthopedics;  Laterality: Right;    TUBAL LIGATION      Family History  Problem Relation Age of Onset   Heart disease Mother    Hypertension Mother    Heart attack Mother 70       MULTIPLES OVER TIME   Melanoma Daughter 22       METASTATIC   Breast cancer Sister    Hypertension Father    Hypertension Sister     Allergies  Allergen Reactions   Sulfa Antibiotics      Levonne Spiller syndrome   Sulfonamide Derivatives     Levonne Spiller syndrome    Current Outpatient Medications on File Prior to Visit  Medication Sig Dispense Refill   Accu-Chek FastClix Lancets MISC USE   TO CHECK GLUCOSE TWICE DAILY 102 each 3   ACCU-CHEK GUIDE test strip USE TO TEST BLOOD  GLUCOSE TWICE DAILY 200 strip 1   amoxicillin (AMOXIL) 500 MG capsule Take 1,000 mg by mouth 2 (two) times daily.     atorvastatin (LIPITOR) 80 MG tablet TAKE 1 TABLET BY MOUTH DAILY 90 tablet 3   buPROPion (WELLBUTRIN XL) 300 MG 24 hr tablet TAKE 1 TABLET BY MOUTH DAILY 90 tablet 3   carvedilol (COREG) 6.25 MG tablet Take 1 tablet (6.25 mg total) by mouth 2 (two) times daily with a meal. 180 tablet 3   conjugated estrogens (PREMARIN) vaginal cream Place 0.5 Applicatorfuls vaginally every 7 (seven) days. 42.5 g 6   denosumab (PROLIA) 60 MG/ML SOSY injection Inject 60 mg into the skin every 6 (six) months.     Diclofenac Sodium 1 % CREA Apply topically.     furosemide (LASIX) 20 MG tablet Take 1 tablet (20 mg total) by mouth 2 (two) times daily. 180 tablet 2   JARDIANCE 25 MG TABS tablet TAKE 1 TABLET BY MOUTH DAILY  BEFORE BREAKFAST 90 tablet 3   levothyroxine (SYNTHROID) 50 MCG tablet TAKE 1 TABLET BY MOUTH DAILY  BEFORE BREAKFAST 30 tablet 11   nitroGLYCERIN (NITROSTAT) 0.4 MG SL tablet DISSOLVE 1 TABLET UNDER THE TONGUE EVERY 5 MINUTES AS&nbsp;&nbsp;NEEDED FOR CHEST PAIN. MAX&nbsp;&nbsp;3 DOSES IN 15 MINUTES. CALL 911 IF CHEST PAIN PERSISTS 100 tablet 6   pioglitazone (ACTOS) 30 MG tablet TAKE 1 TABLET BY MOUTH DAILY 90 tablet 3   potassium chloride SA (KLOR-CON M) 20 MEQ tablet Take 2 tablets (40 mEq total) by mouth daily. 180 tablet 3   propranolol (INDERAL) 10 MG tablet Take 1 tablet (10 mg total) by mouth 2 (two) times daily as needed (palpitations). 180 tablet 1   acetaminophen (TYLENOL) 325 MG tablet Take 650 mg by mouth every 6 (six) hours as needed for moderate pain. (Patient not taking: Reported on 01/03/2023)     No current facility-administered medications on file prior to visit.    BP 110/60   Pulse 70   Temp 98.1 F (36.7 C) (Oral)   Ht 4\' 10"  (1.473 m)   Wt 135 lb (61.2 kg)  SpO2 96%   BMI 28.22 kg/m       Objective:   Physical Exam Vitals and nursing note reviewed.   Constitutional:      General: She is not in acute distress.    Appearance: Normal appearance. She is not ill-appearing.  HENT:     Head: Normocephalic and atraumatic.     Right Ear: Tympanic membrane, ear canal and external ear normal. There is no impacted cerumen.     Left Ear: Tympanic membrane, ear canal and external ear normal. There is no impacted cerumen.     Nose: Nose normal. No congestion or rhinorrhea.     Mouth/Throat:     Mouth: Mucous membranes are moist.     Pharynx: Oropharynx is clear.  Eyes:     Extraocular Movements: Extraocular movements intact.     Conjunctiva/sclera: Conjunctivae normal.     Pupils: Pupils are equal, round, and reactive to light.  Neck:     Vascular: No carotid bruit.  Cardiovascular:     Rate and Rhythm: Normal rate and regular rhythm.     Pulses: Normal pulses.     Heart sounds: No murmur heard.    No friction rub. No gallop.  Pulmonary:     Effort: Pulmonary effort is normal.     Breath sounds: Normal breath sounds.  Abdominal:     General: Abdomen is flat. Bowel sounds are normal. There is no distension.     Palpations: Abdomen is soft. There is no mass.     Tenderness: There is no abdominal tenderness. There is no guarding or rebound.     Hernia: No hernia is present.  Musculoskeletal:        General: Normal range of motion.     Cervical back: Normal range of motion and neck supple.  Lymphadenopathy:     Cervical: No cervical adenopathy.  Skin:    General: Skin is warm and dry.     Capillary Refill: Capillary refill takes less than 2 seconds.  Neurological:     General: No focal deficit present.     Mental Status: She is alert and oriented to person, place, and time.  Psychiatric:        Mood and Affect: Mood normal.        Behavior: Behavior normal.        Thought Content: Thought content normal.        Judgment: Judgment normal.       Assessment & Plan:  1. Diabetes mellitus without complication (HCC) - Consider  increase in Actos  - CBC with Differential/Platelet; Future - Comprehensive metabolic panel; Future - Lipid panel; Future - TSH; Future - Hemoglobin A1c; Future - Microalbumin/Creatinine Ratio, Urine; Future  2. Coronary artery disease involving native coronary artery of native heart without angina pectoris - Per cardiology  - CBC with Differential/Platelet; Future - Comprehensive metabolic panel; Future - Lipid panel; Future - TSH; Future - Hemoglobin A1c; Future - Microalbumin/Creatinine Ratio, Urine; Future - amoxicillin (AMOXIL) 500 MG capsule; Take 4 capsules (2,000 mg total) by mouth once for 1 dose.  Dispense: 4 capsule; Refill: 1  3. Chronic diastolic CHF (congestive heart failure) (HCC) - Per cardiology  - CBC with Differential/Platelet; Future - Comprehensive metabolic panel; Future - Lipid panel; Future - TSH; Future - Hemoglobin A1c; Future - Microalbumin/Creatinine Ratio, Urine; Future - amoxicillin (AMOXIL) 500 MG capsule; Take 4 capsules (2,000 mg total) by mouth once for 1 dose.  Dispense: 4 capsule; Refill: 1  4.  Hypothyroidism, unspecified type - Consider dose change of synthroid  - CBC with Differential/Platelet; Future - Comprehensive metabolic panel; Future - Lipid panel; Future - TSH; Future - Hemoglobin A1c; Future - Microalbumin/Creatinine Ratio, Urine; Future  5. Mixed hyperlipidemia - Consider increase in statin  - CBC with Differential/Platelet; Future - Comprehensive metabolic panel; Future - Lipid panel; Future - TSH; Future - Hemoglobin A1c; Future - Microalbumin/Creatinine Ratio, Urine; Future  6. Insomnia, unspecified type  - CBC with Differential/Platelet; Future - Comprehensive metabolic panel; Future - Lipid panel; Future - TSH; Future - Hemoglobin A1c; Future - Microalbumin/Creatinine Ratio, Urine; Future - eszopiclone (LUNESTA) 1 MG TABS tablet; Take 1 tablet (1 mg total) by mouth at bedtime as needed for sleep. Take  immediately before bedtime  Dispense: 30 tablet; Refill: 2  7. Anxiety and depression - Continue with Wellbutrin  - CBC with Differential/Platelet; Future - Comprehensive metabolic panel; Future - Lipid panel; Future - TSH; Future - Hemoglobin A1c; Future - Microalbumin/Creatinine Ratio, Urine; Future  8. Age-related osteoporosis without current pathological fracture  - VITAMIN D 25 Hydroxy (Vit-D Deficiency, Fractures); Future  Shirline Frees, NP

## 2023-01-04 ENCOUNTER — Telehealth: Payer: Self-pay | Admitting: Adult Health

## 2023-01-04 DIAGNOSIS — E119 Type 2 diabetes mellitus without complications: Secondary | ICD-10-CM

## 2023-01-04 MED ORDER — ACCU-CHEK GUIDE VI STRP
1.0000 | ORAL_STRIP | Freq: Four times a day (QID) | 3 refills | Status: DC
Start: 2023-01-04 — End: 2023-02-02

## 2023-01-04 MED ORDER — PIOGLITAZONE HCL 45 MG PO TABS: 45.0000 mg | ORAL_TABLET | Freq: Every day | ORAL | 1 refills | Status: DC

## 2023-01-04 MED ORDER — ACCU-CHEK FASTCLIX LANCETS MISC
1.0000 | Freq: Four times a day (QID) | 3 refills | Status: AC
Start: 2023-01-04 — End: 2023-04-04

## 2023-01-04 NOTE — Telephone Encounter (Signed)
Updated patient on her labs. A1c has increased to 7.4. Will increase Actos to 45 mg and have her follow up in 3 months

## 2023-01-05 ENCOUNTER — Other Ambulatory Visit: Payer: Self-pay

## 2023-01-05 MED ORDER — FUROSEMIDE 20 MG PO TABS: 20.0000 mg | ORAL_TABLET | Freq: Two times a day (BID) | ORAL | 3 refills | Status: AC

## 2023-01-15 ENCOUNTER — Other Ambulatory Visit: Payer: Self-pay | Admitting: Adult Health

## 2023-01-15 DIAGNOSIS — E119 Type 2 diabetes mellitus without complications: Secondary | ICD-10-CM

## 2023-01-24 DIAGNOSIS — M5416 Radiculopathy, lumbar region: Secondary | ICD-10-CM | POA: Diagnosis not present

## 2023-01-30 NOTE — Progress Notes (Signed)
Cardiology Office Note:  .   Date:  01/31/2023  ID:  Rebecca [name], DOB 21-Apr-1946, MRN [id number] PCP: Shirline Frees, NP  [facility] Providers Cardiologist:  Tobias Alexander, MD Cardiology APP:  Dyann Kief, PA-C {  History of Present Illness: .   Rebecca [name] is a 77 y.o. female with a hx of of CAD status post PCI to the RCA in 2010,  hyperlipidemia, HTN, chronic diastolic CHF who presents to clinic for follow-up. Was previously followed by by Dr. Delton See.   Per review of the record, patient has known CAD with RCA PCI in 2010. Repeat cath in 04/2017 with mild disease, patent RCA stent. Recommended for medical management. TTE 10/2020 with normal EF 60-65%, G1DD, RV okay, trivial MR, RAP .    Per report, the patient states that at 77 years old, she was working at a health fair with 2 nurses and notices she had to hold herself up against the table. She remembers feeling poorly and drove herself home and believes she lost consciousness in her bed. In the following 3 days, she went to work and continued to feel poorly. A doctor at her workplace gave her nitroglycerin and morphine and rushed her to the ER. She was found to have complete occlusion of the R coronary artery but was unable to undergo surgery at the time. Had subsequent PCI to RCA in 2010.   Was last seen in clinic on 04/2022 where she was complaining of LE edema. We recommended an increase in her diuretic dosing at that time.  Last seen 11/01/22, Patient has been under a lot of stress at home as her husband has was diagnosed with dementia and she has been diagnosed with severe spinal stenosis which is limiting her mobility. In this setting, she has noticed her BP has been fluctuating and she finds that those days she feels more tired and finds her legs are swollen. She has also had episodes of palpitations that occur intermittently throughout the day and night. No sustained episodes but they have been bothersome. No  chest pain, lightheadedness, or syncope. Has been tolerating her medications.   Today, she tells me she has severe spinal stenosis and ruptured disks. Cardia mobile check revealed QRS complexes that were inverted and erratic per the patient (she could not print the rhythm strip), no chest pain and no SOB. Patient questioned if it was a big run of PVCs?, converted with a metoprolol.  HR was not fast but irregular. BP was okay. She did it three times and it was the same. She plans to update her device so she can send [place] the strips if it happens again. She also lives in [place] and they have EKG resources there as well.  Reports no shortness of breath nor dyspnea on exertion. Reports no chest pain, pressure, or tightness. No edema, orthopnea, PND.    ROS: Pertinent ROS in HPI  Studies Reviewed: Marland Kitchen        TTE 10/2020: IMPRESSIONS:  1. Left ventricular ejection fraction, by estimation, is 60 to 65%. Left  ventricular ejection fraction by 3D volume is 66 %. The left ventricle has  normal function. The left ventricle has no regional wall motion  abnormalities. Left ventricular diastolic   parameters are consistent with Grade I diastolic dysfunction (impaired  relaxation).   2. Right ventricular systolic function is normal. The right ventricular  size is normal.   3. The mitral valve is normal in structure. Trivial mitral valve  regurgitation. No evidence of mitral stenosis.   4. The aortic valve is tricuspid. Aortic valve regurgitation is not  visualized. No aortic stenosis is present.   5. The inferior vena cava is normal in size with greater than 50%  respiratory variability, suggesting right atrial pressure of 3 mmHg.    Cardiac Monitor 02/20: Sinus bradycardia to sinus tachycardia. Infrequent PACs, PVCs. Few very short SVT runs.   Sinus bradycardia to sinus tachycardia. Infrequent PACs, PVCs. Few very short SVT runs.   LHC 04/2017: Prox RCA to Mid RCA lesion, 0 %stenosed. Prox RCA  lesion, 40 %stenosed. Ost LAD to Prox LAD lesion, 20 %stenosed. Prox LAD to Mid LAD lesion, 30 %stenosed. The left ventricular systolic function is normal. LV end diastolic pressure is normal. The left ventricular ejection fraction is greater than 65% by visual estimate. There is no mitral valve regurgitation.   1. Single vessel CAD with patent stent RCA. The RCA stent has mild to moderate restenosis. This does not appear to be flow limiting.  2. Mild disease LAD 3. Normal LV systolic function   Recommendations: Continue medical management of CAD.        Physical Exam:   VS:  BP 124/62   Pulse 75   Ht 4\' 10"  (1.473 m)   Wt 136 lb (61.7 kg)   SpO2 95%   BMI 28.42 kg/m    Wt Readings from Last 3 Encounters:  01/31/23 136 lb (61.7 kg)  01/03/23 135 lb (61.2 kg)  11/01/22 140 lb 3.2 oz (63.6 kg)    GEN: Well nourished, well developed in no acute distress NECK: No JVD; No carotid bruits CARDIAC: RRR, no murmurs, rubs, gallops RESPIRATORY:  Clear to auscultation without rales, wheezing or rhonchi  ABDOMEN: Soft, non-tender, non-distended EXTREMITIES:  No edema; No deformity   ASSESSMENT AND PLAN: .   CAD s/p PCI to RCA -No chest pain or shortness of breath -Continue Lipitor 80 mg daily, carvedilol 6.25 mg twice a day, Jardiance 25 mg daily, Lasix 20 mg twice a day, as needed propranolol 10 mg twice a day nitro as needed, potassium 20 mEq twice a day, spironolactone 12.5 mg daily  Chronic Diastolic HF -some swelling, she does take lasix  -in recliner with her legs up due to her back -She hopes to get back to her usual routine once her back feels better -Continue current medication  Palpitations -Had an episode as described above -Her propranolol helped convert her -will request an EKG if it happens again  DMII -Continue current medication regimen -A1c was 7.4  5. Epidural  -issues today with her back, possible hairline fracture? -she is heading to the ED for xray and  further workup      Dispo: She will follow-up in 6 months with Dr. Jacques Navy  Signed, Sharlene Dory, PA-C

## 2023-01-31 ENCOUNTER — Other Ambulatory Visit: Payer: Self-pay

## 2023-01-31 ENCOUNTER — Emergency Department (HOSPITAL_COMMUNITY): Payer: Medicare Other

## 2023-01-31 ENCOUNTER — Emergency Department (HOSPITAL_COMMUNITY)
Admission: EM | Admit: 2023-01-31 | Discharge: 2023-01-31 | Disposition: A | Discharge status: Home / Self Care | Payer: Medicare Other | Attending: Emergency Medicine | Admitting: Emergency Medicine

## 2023-01-31 ENCOUNTER — Encounter (HOSPITAL_COMMUNITY): Payer: Self-pay

## 2023-01-31 ENCOUNTER — Ambulatory Visit (INDEPENDENT_AMBULATORY_CARE_PROVIDER_SITE_OTHER): Payer: Medicare Other | Admitting: Physician Assistant

## 2023-01-31 ENCOUNTER — Encounter: Payer: Self-pay | Admitting: Physician Assistant

## 2023-01-31 VITALS — BP 124/62 | HR 75 | Ht <= 58 in | Wt 136.0 lb

## 2023-01-31 DIAGNOSIS — E119 Type 2 diabetes mellitus without complications: Secondary | ICD-10-CM | POA: Diagnosis not present

## 2023-01-31 DIAGNOSIS — E785 Hyperlipidemia, unspecified: Secondary | ICD-10-CM | POA: Diagnosis not present

## 2023-01-31 DIAGNOSIS — M545 Low back pain, unspecified: Secondary | ICD-10-CM | POA: Diagnosis not present

## 2023-01-31 DIAGNOSIS — Z85828 Personal history of other malignant neoplasm of skin: Secondary | ICD-10-CM | POA: Insufficient documentation

## 2023-01-31 DIAGNOSIS — Z79899 Other long term (current) drug therapy: Secondary | ICD-10-CM | POA: Diagnosis not present

## 2023-01-31 DIAGNOSIS — I2510 Atherosclerotic heart disease of native coronary artery without angina pectoris: Secondary | ICD-10-CM

## 2023-01-31 DIAGNOSIS — I110 Hypertensive heart disease with heart failure: Secondary | ICD-10-CM | POA: Diagnosis not present

## 2023-01-31 DIAGNOSIS — M4319 Spondylolisthesis, multiple sites in spine: Secondary | ICD-10-CM | POA: Diagnosis not present

## 2023-01-31 DIAGNOSIS — I5032 Chronic diastolic (congestive) heart failure: Secondary | ICD-10-CM

## 2023-01-31 DIAGNOSIS — J45909 Unspecified asthma, uncomplicated: Secondary | ICD-10-CM | POA: Diagnosis not present

## 2023-01-31 DIAGNOSIS — Z7984 Long term (current) use of oral hypoglycemic drugs: Secondary | ICD-10-CM | POA: Diagnosis not present

## 2023-01-31 DIAGNOSIS — R0602 Shortness of breath: Secondary | ICD-10-CM | POA: Insufficient documentation

## 2023-01-31 DIAGNOSIS — M549 Dorsalgia, unspecified: Secondary | ICD-10-CM | POA: Diagnosis present

## 2023-01-31 DIAGNOSIS — M48061 Spinal stenosis, lumbar region without neurogenic claudication: Secondary | ICD-10-CM | POA: Diagnosis not present

## 2023-01-31 DIAGNOSIS — M5126 Other intervertebral disc displacement, lumbar region: Secondary | ICD-10-CM | POA: Diagnosis not present

## 2023-01-31 DIAGNOSIS — M47816 Spondylosis without myelopathy or radiculopathy, lumbar region: Secondary | ICD-10-CM | POA: Diagnosis not present

## 2023-01-31 DIAGNOSIS — G8929 Other chronic pain: Secondary | ICD-10-CM | POA: Insufficient documentation

## 2023-01-31 DIAGNOSIS — Z79890 Hormone replacement therapy (postmenopausal): Secondary | ICD-10-CM | POA: Diagnosis not present

## 2023-01-31 DIAGNOSIS — E039 Hypothyroidism, unspecified: Secondary | ICD-10-CM | POA: Diagnosis not present

## 2023-01-31 DIAGNOSIS — I10 Essential (primary) hypertension: Secondary | ICD-10-CM | POA: Insufficient documentation

## 2023-01-31 LAB — CBC WITH DIFFERENTIAL/PLATELET
Abs Immature Granulocytes: 0.03 10*3/uL (ref 0.00–0.07)
Basophils Absolute: 0 10*3/uL (ref 0.0–0.1)
Basophils Relative: 0 %
Eosinophils Absolute: 0.1 10*3/uL (ref 0.0–0.5)
Eosinophils Relative: 1 %
HCT: 46.8 % — ABNORMAL HIGH (ref 36.0–46.0)
Hemoglobin: 15.1 g/dL — ABNORMAL HIGH (ref 12.0–15.0)
Immature Granulocytes: 1 %
Lymphocytes Relative: 22 %
Lymphs Abs: 1.4 10*3/uL (ref 0.7–4.0)
MCH: 30.9 pg (ref 26.0–34.0)
MCHC: 32.3 g/dL (ref 30.0–36.0)
MCV: 95.9 fL (ref 80.0–100.0)
Monocytes Absolute: 0.4 10*3/uL (ref 0.1–1.0)
Monocytes Relative: 6 %
Neutro Abs: 4.5 10*3/uL (ref 1.7–7.7)
Neutrophils Relative %: 70 %
Platelets: 257 10*3/uL (ref 150–400)
RBC: 4.88 MIL/uL (ref 3.87–5.11)
RDW: 13.5 % (ref 11.5–15.5)
WBC: 6.3 10*3/uL (ref 4.0–10.5)
nRBC: 0 % (ref 0.0–0.2)

## 2023-01-31 LAB — COMPREHENSIVE METABOLIC PANEL
ALT: 19 U/L (ref 0–44)
AST: 18 U/L (ref 15–41)
Albumin: 4.1 g/dL (ref 3.5–5.0)
Alkaline Phosphatase: 53 U/L (ref 38–126)
Anion gap: 16 — ABNORMAL HIGH (ref 5–15)
BUN: 22 mg/dL (ref 8–23)
CO2: 25 mmol/L (ref 22–32)
Calcium: 9.8 mg/dL (ref 8.9–10.3)
Chloride: 98 mmol/L (ref 98–111)
Creatinine, Ser: 0.93 mg/dL (ref 0.44–1.00)
GFR, Estimated: >60 mL/min (ref >60)
Glucose, Bld: 136 mg/dL — ABNORMAL HIGH (ref 70–99)
Potassium: 4.7 mmol/L (ref 3.5–5.1)
Sodium: 139 mmol/L (ref 135–145)
Total Bilirubin: 0.9 mg/dL (ref 0.3–1.2)
Total Protein: 6.3 g/dL — ABNORMAL LOW (ref 6.5–8.1)

## 2023-01-31 MED ORDER — NITROGLYCERIN 0.4 MG SL SUBL
SUBLINGUAL_TABLET | SUBLINGUAL | 3 refills | Status: DC
Start: 2023-01-31 — End: 2023-11-08

## 2023-01-31 MED ORDER — METHOCARBAMOL 500 MG PO TABS: 500.0000 mg | ORAL_TABLET | Freq: Two times a day (BID) | ORAL | 0 refills | Status: AC

## 2023-01-31 MED ORDER — GADOBUTROL 1 MMOL/ML IV SOLN
6.0000 mL | Freq: Once | INTRAVENOUS | Status: AC | PRN
  Administered 2023-01-31: 6 mL via INTRAVENOUS

## 2023-01-31 MED ORDER — LIDOCAINE 5 % EX PTCH
1.0000 | MEDICATED_PATCH | CUTANEOUS | Status: DC
  Administered 2023-01-31: 1 via TRANSDERMAL
  Filled 2023-01-31: qty 1

## 2023-01-31 MED ORDER — OXYCODONE HCL 5 MG PO TABS
5.0000 mg | ORAL_TABLET | Freq: Once | ORAL | Status: DC
  Filled 2023-01-31: qty 1

## 2023-01-31 MED ORDER — KETOROLAC TROMETHAMINE 15 MG/ML IJ SOLN
15.0000 mg | Freq: Once | INTRAMUSCULAR | Status: AC
  Administered 2023-01-31: 15 mg via INTRAVENOUS
  Filled 2023-01-31: qty 1

## 2023-01-31 MED ORDER — ACETAMINOPHEN 500 MG PO TABS
1000.0000 mg | ORAL_TABLET | Freq: Once | ORAL | Status: AC
  Administered 2023-01-31: 1000 mg via ORAL
  Filled 2023-01-31: qty 2

## 2023-01-31 NOTE — Patient Instructions (Signed)
Medication Instructions:   Your physician recommends that you continue on your current medications as directed. Please refer to the Current Medication list given to you today.  *If you need a refill on your cardiac medications before your next appointment, please call your pharmacy*   Lab Work: NONE ORDERED  TODAY    If you have labs (blood work) drawn today and your tests are completely normal, you will receive your results only by: MyChart Message (if you have MyChart) OR A paper copy in the mail If you have any lab test that is abnormal or we need to change your treatment, we will call you to review the results.   Testing/Procedures: NONE ORDERED  TODAY     Follow-Up: At [facility], you and your health needs are our priority.  As part of our continuing mission to provide you with exceptional heart care, we have created designated Provider Care Teams.  These Care Teams include your primary Cardiologist (physician) and Advanced Practice Providers (APPs -  Physician Assistants and Nurse Practitioners) who all work together to provide you with the care you need, when you need it.  We recommend signing up for the patient portal called "MyChart".  Sign up information is provided on this After Visit Summary.  MyChart is used to connect with patients for Virtual Visits (Telemedicine).  Patients are able to view lab/test results, encounter notes, upcoming appointments, etc.  Non-urgent messages can be sent to your provider as well.   To learn more about what you can do with MyChart, go to [email or web address].    Your next appointment:   6 month(s)  Provider:    Jacques Navy   Other Instructions  Low-Sodium Eating Plan Salt (sodium) helps you keep a healthy balance of fluids in your body. Too much sodium can raise your blood pressure. It can also cause fluid and waste to be held in your body. Your health care provider or dietitian may recommend a low-sodium eating plan if  you have high blood pressure (hypertension), kidney disease, liver disease, or heart failure. Eating less sodium can help lower your blood pressure and reduce swelling. It can also protect your heart, liver, and kidneys. What are tips for following this plan? Reading food labels  Check food labels for the amount of sodium per serving. If you eat more than one serving, you must multiply the listed amount by the number of servings. Choose foods with less than 140 milligrams (mg) of sodium per serving. Avoid foods with 300 mg of sodium or more per serving. Always check how much sodium is in a product, even if the label says "unsalted" or "no salt added." Shopping  Buy products labeled as "low-sodium" or "no salt added." Buy fresh foods. Avoid canned foods and pre-made or frozen meals. Avoid canned, cured, or processed meats. Buy breads that have less than 80 mg of sodium per slice. Cooking  Eat more home-cooked food. Try to eat less restaurant, buffet, and fast food. Try not to add salt when you cook. Use salt-free seasonings or herbs instead of table salt or sea salt. Check with your provider or pharmacist before using salt substitutes. Cook with plant-based oils, such as canola, sunflower, or olive oil. Meal planning When eating at a restaurant, ask if your food can be made with less salt or no salt. Avoid dishes labeled as brined, pickled, cured, or smoked. Avoid dishes made with soy sauce, miso, or teriyaki sauce. Avoid foods that have monosodium glutamate (MSG) in them.  MSG may be added to some restaurant food, sauces, soups, bouillon, and canned foods. Make meals that can be grilled, baked, poached, roasted, or steamed. These are often made with less sodium. General information Try to limit your sodium intake to 1,500-2,300 mg each day, or the amount told by your provider. What foods should I eat? Fruits Fresh, frozen, or canned fruit. Fruit juice. Vegetables Fresh or frozen  vegetables. "No salt added" canned vegetables. "No salt added" tomato sauce and paste. Low-sodium or reduced-sodium tomato and vegetable juice. Grains Low-sodium cereals, such as oats, puffed wheat and rice, and shredded wheat. Low-sodium crackers. Unsalted rice. Unsalted pasta. Low-sodium bread. Whole grain breads and whole grain pasta. Meats and other proteins Fresh or frozen meat, poultry, seafood, and fish. These should have no added salt. Low-sodium canned tuna and salmon. Unsalted nuts. Dried peas, beans, and lentils without added salt. Unsalted canned beans. Eggs. Unsalted nut butters. Dairy Milk. Soy milk. Cheese that is naturally low in sodium, such as ricotta cheese, fresh mozzarella, or Swiss cheese. Low-sodium or reduced-sodium cheese. Cream cheese. Yogurt. Seasonings and condiments Fresh and dried herbs and spices. Salt-free seasonings. Low-sodium mustard and ketchup. Sodium-free salad dressing. Sodium-free light mayonnaise. Fresh or refrigerated horseradish. Lemon juice. Vinegar. Other foods Homemade, reduced-sodium, or low-sodium soups. Unsalted popcorn and pretzels. Low-salt or salt-free chips. The items listed above may not be all the foods and drinks you can have. Talk to a dietitian to learn more. What foods should I avoid? Vegetables Sauerkraut, pickled vegetables, and relishes. Olives. [place] fries. Onion rings. Regular canned vegetables, except low-sodium or reduced-sodium items. Regular canned tomato sauce and paste. Regular tomato and vegetable juice. Frozen vegetables in sauces. Grains Instant hot cereals. Bread stuffing, pancake, and biscuit mixes. Croutons. Seasoned rice or pasta mixes. Noodle soup cups. Boxed or frozen macaroni and cheese. Regular salted crackers. Self-rising flour. Meats and other proteins Meat or fish that is salted, canned, smoked, spiced, or pickled. Precooked or cured meat, such as sausages or meat loaves. Rebecca [name]. Ham. Pepperoni. Hot dogs. Corned  beef. Chipped beef. Salt pork. Jerky. Pickled herring, anchovies, and sardines. Regular canned tuna. Salted nuts. Dairy Processed cheese and cheese spreads. Hard cheeses. Cheese curds. Blue cheese. Feta cheese. String cheese. Regular cottage cheese. Buttermilk. Canned milk. Fats and oils Salted butter. Regular margarine. Ghee. Bacon fat. Seasonings and condiments Onion salt, garlic salt, seasoned salt, table salt, and sea salt. Canned and packaged gravies. Worcestershire sauce. Tartar sauce. Barbecue sauce. Teriyaki sauce. Soy sauce, including reduced-sodium soy sauce. Steak sauce. Fish sauce. Oyster sauce. Cocktail sauce. Horseradish that you find on the shelf. Regular ketchup and mustard. Meat flavorings and tenderizers. Bouillon cubes. Hot sauce. Pre-made or packaged marinades. Pre-made or packaged taco seasonings. Relishes. Regular salad dressings. Salsa. Other foods Salted popcorn and pretzels. Corn chips and puffs. Potato and tortilla chips. Canned or dried soups. Pizza. Frozen entrees and pot pies. The items listed above may not be all the foods and drinks you should avoid. Talk to a dietitian to learn more. This information is not intended to replace advice given to you by your health care provider. Make sure you discuss any questions you have with your health care provider. Document Revised: 07/20/2022 Document Reviewed: 07/20/2022 Elsevier Patient Education  2024 [employer]  Heart-Healthy Eating Plan Eating a healthy diet is important for the health of your heart. A heart-healthy eating plan includes: Eating less unhealthy fats. Eating more healthy fats. Eating less salt in your food. Salt is also called sodium.  Making other changes in your diet. Talk with your doctor or a diet specialist (dietitian) to create an eating plan that is right for you. What is my plan? Your doctor may recommend an eating plan that includes: Total fat: ______% or less of total calories a  day. Saturated fat: ______% or less of total calories a day. Cholesterol: less than _________mg a day. Sodium: less than _________mg a day. What are tips for following this plan? Cooking Avoid frying your food. Try to bake, boil, grill, or broil it instead. You can also reduce fat by: Removing the skin from poultry. Removing all visible fats from meats. Steaming vegetables in water or broth. Meal planning  At meals, divide your plate into four equal parts: Fill one-half of your plate with vegetables and green salads. Fill one-fourth of your plate with whole grains. Fill one-fourth of your plate with lean protein foods. Eat 2-4 cups of vegetables per day. One cup of vegetables is: 1 cup (91 g) broccoli or cauliflower florets. 2 medium carrots. 1 large bell pepper. 1 large sweet potato. 1 large tomato. 1 medium white potato. 2 cups (150 g) raw leafy greens. Eat 1-2 cups of fruit per day. One cup of fruit is: 1 small apple 1 large banana 1 cup (237 g) mixed fruit, 1 large orange,  cup (82 g) dried fruit, 1 cup (240 mL) 100% fruit juice. Eat more foods that have soluble fiber. These are apples, broccoli, carrots, beans, peas, and barley. Try to get 20-30 g of fiber per day. Eat 4-5 servings of nuts, legumes, and seeds per week: 1 serving of dried beans or legumes equals  cup (90 g) cooked. 1 serving of nuts is  oz (12 almonds, 24 pistachios, or 7 walnut halves). 1 serving of seeds equals  oz (8 g). General information Eat more home-cooked food. Eat less restaurant, buffet, and fast food. Limit or avoid alcohol. Limit foods that are high in starch and sugar. Avoid fried foods. Lose weight if you are overweight. Keep track of how much salt (sodium) you eat. This is important if you have high blood pressure. Ask your doctor to tell you more about this. Try to add vegetarian meals each week. Fats Choose healthy fats. These include olive oil and canola oil, flaxseeds,  walnuts, almonds, and seeds. Eat more omega-3 fats. These include salmon, mackerel, sardines, tuna, flaxseed oil, and ground flaxseeds. Try to eat fish at least 2 times each week. Check food labels. Avoid foods with trans fats or high amounts of saturated fat. Limit saturated fats. These are often found in animal products, such as meats, butter, and cream. These are also found in plant foods, such as palm oil, palm kernel oil, and coconut oil. Avoid foods with partially hydrogenated oils in them. These have trans fats. Examples are stick margarine, some tub margarines, cookies, crackers, and other baked goods. What foods should I eat? Fruits All fresh, canned (in natural juice), or frozen fruits. Vegetables Fresh or frozen vegetables (raw, steamed, roasted, or grilled). Green salads. Grains Most grains. Choose whole wheat and whole grains most of the time. Rice and pasta, including brown rice and pastas made with whole wheat. Meats and other proteins Lean, well-trimmed beef, veal, pork, and lamb. Chicken and [place] without skin. All fish and shellfish. Wild duck, rabbit, pheasant, and venison. Egg whites or low-cholesterol egg substitutes. Dried beans, peas, lentils, and tofu. Seeds and most nuts. Dairy Low-fat or nonfat cheeses, including ricotta and mozzarella. Skim or 1%  milk that is liquid, powdered, or evaporated. Buttermilk that is made with low-fat milk. Nonfat or low-fat yogurt. Fats and oils Non-hydrogenated (trans-free) margarines. Vegetable oils, including soybean, sesame, sunflower, olive, peanut, safflower, corn, canola, and cottonseed. Salad dressings or mayonnaise made with a vegetable oil. Beverages Mineral water. Coffee and tea. Diet carbonated beverages. Sweets and desserts Sherbet, gelatin, and fruit ice. Small amounts of dark chocolate. Limit all sweets and desserts. Seasonings and condiments All seasonings and condiments. The items listed above may not be a complete  list of foods and drinks you can eat. Contact a dietitian for more options. What foods should I avoid? Fruits Canned fruit in heavy syrup. Fruit in cream or butter sauce. Fried fruit. Limit coconut. Vegetables Vegetables cooked in cheese, cream, or butter sauce. Fried vegetables. Grains Breads that are made with saturated or trans fats, oils, or whole milk. Croissants. Sweet rolls. Donuts. High-fat crackers, such as cheese crackers. Meats and other proteins Fatty meats, such as hot dogs, ribs, sausage, bacon, rib-eye roast or steak. High-fat deli meats, such as salami and bologna. Caviar. Domestic duck and goose. Organ meats, such as liver. Dairy Cream, sour cream, cream cheese, and creamed cottage cheese. Whole-milk cheeses. Whole or 2% milk that is liquid, evaporated, or condensed. Whole buttermilk. Cream sauce or high-fat cheese sauce. Yogurt that is made from whole milk. Fats and oils Meat fat, or shortening. Cocoa butter, hydrogenated oils, palm oil, coconut oil, palm kernel oil. Solid fats and shortenings, including bacon fat, salt pork, lard, and butter. Nondairy cream substitutes. Salad dressings with cheese or sour cream. Beverages Regular sodas and juice drinks with added sugar. Sweets and desserts Frosting. Pudding. Cookies. Cakes. Pies. Milk chocolate or white chocolate. Buttered syrups. Full-fat ice cream or ice cream drinks. The items listed above may not be a complete list of foods and drinks to avoid. Contact a dietitian for more information. Summary Heart-healthy meal planning includes eating less unhealthy fats, eating more healthy fats, and making other changes in your diet. Eat a balanced diet. This includes fruits and vegetables, low-fat or nonfat dairy, lean protein, nuts and legumes, whole grains, and heart-healthy oils and fats. This information is not intended to replace advice given to you by your health care provider. Make sure you discuss any questions you have with  your health care provider. Document Revised: 08/08/2021 Document Reviewed: 08/08/2021 Elsevier Patient Education  2024 [employer].

## 2023-01-31 NOTE — ED Notes (Signed)
Not claustrophobic. No drips or oxygen. Has heart stent and bilateral knee repairs.

## 2023-01-31 NOTE — ED Notes (Signed)
Pt is refusing oxycodone at this time. Pt states she wants to see if the other meds will help first.

## 2023-01-31 NOTE — ED Provider Triage Note (Signed)
Emergency Medicine Provider Triage Evaluation Note  Rebecca [name] , a 77 y.o. female  was evaluated in triage.  History of spinal stenosis and chronic lower back pain, melanoma.  She recently got an epidural for her back pain about a month ago which improved her back pain.  About 1 week ago patient developed severe stabbing right lower back pain uncharacteristic of her chronic back pain.  Denies any fevers, chills, loss of bladder or bladder control, IV drug use, numbness or tingling down her legs.  No recent falls.  Review of Systems  Positive: As above Negative: As above  Physical Exam  BP 121/60 (BP Location: Right Arm)   Pulse 71   Temp 97.8 F (36.6 C) (Oral)   Resp 20   SpO2 100%  Gen:   Uncomfortable appearing Resp:  Normal effort  MSK:   Moves extremities without difficulty.  Manage back with difficulty Other:  No rash  Medical Decision Making  Medically screening exam initiated at 12:18 PM.  Appropriate orders placed.  TRANIKA SCHOLLER was informed that the remainder of the evaluation will be completed by another provider, this initial triage assessment does not replace that evaluation, and the importance of remaining in the ED until their evaluation is complete.     Arabella Merles, PA-C 01/31/23 1227

## 2023-01-31 NOTE — ED Triage Notes (Signed)
Pt with chronic back pain d/t spinal stenosis and bulging disks. She states that she had an epidural last week for pain. Two days ago she began having worsening pain in R sacral area non-radiating. Denies known injury/fall. Pt denies saddle anesthesia, bowel/bladder incontinence.

## 2023-01-31 NOTE — Discharge Instructions (Signed)
Thank you for coming to [facility] Emergency Department. You were seen for back pain. We did an exam, labs, and imaging including MRI, and these showed spinal stenosis and no other acute findings. Please follow up with your spine doctor within 1 week. You can take 1,000 mg of tylenol every 8 hours, and robaxin 500 mg twice per day. Please utilize ice/heat as well.   Return to the ED if you develop any of the following: - Fever (100.4 F or 38 C) or chills at home that do not respond to over the counter medications - Weakness, numbness, or tingling in your extremities - Difficulty emptying bladder / urinary incontinence - Fecal incontinence - Uncontrolled nausea/vomiting with inability to keep down liquids - Feeling as though you are going to pass out or passing out - Anything else that concerns you

## 2023-01-31 NOTE — ED Provider Notes (Addendum)
[place] EMERGENCY DEPARTMENT AT [facility] Provider Note   CSN: [id number] Arrival date & time: 01/31/23  1131     History  Chief Complaint  Patient presents with   Back Pain    Rebecca [name] is a 77 y.o. female with history of spinal stenosis and chronic lower back pain, melanoma.  She recently got an epidural for her back pain about 1 week ago which improved her back pain for the first 3 days. However after that she developed severe stabbing right lumbar/sacral back pain that she has never had before.  She has had epidurals for her back pain before and never had this happen.  At times it is rated 10 out of 10 and right now it is 7 out of 10.  Uncharacteristic of her chronic back pain.  Denies any fevers, chills, loss of bladder or bladder control, saddle anesthesia, radiating pain, IV drug use, numbness or tingling down her legs.  No recent falls.  .    Past Medical History:  Diagnosis Date   Asthma    extrinsic   CAD (coronary artery disease)    Chest pain, atypical    Complication of anesthesia    Diabetes (HCC)    Edema    Fatigue    Fatigue    GERD (gastroesophageal reflux disease)    Hyperlipidemia    Hypertension    familiar   Hypothyroidism    IBS (irritable bowel syndrome)    hx   Keratosis, seborrheic    Labyrinthitis    MI (myocardial infarction) (HCC)    age 30   Osteoarthritis    PONV (postoperative nausea and vomiting)    S/P arthroscopy of right shoulder 03/01/2018   Skin cancer    Melanoma        Home Medications Prior to Admission medications   Medication Sig Start Date End Date Taking? Authorizing Provider  methocarbamol (ROBAXIN) 500 MG tablet Take 1 tablet (500 mg total) by mouth 2 (two) times daily. 01/31/23  Yes Loetta Rough, MD  Accu-Chek FastClix Lancets MISC Inject 1 Device into the skin in the morning, at noon, in the evening, and at bedtime. USE   TO CHECK GLUCOSE TWICE DAILY 01/04/23 04/04/23  Nafziger, Kandee Keen, NP   ACCU-CHEK GUIDE test strip 1 each by Other route in the morning, at noon, in the evening, and at bedtime. USE TO TEST BLOOD GLUCOSE TWICE DAILY 01/04/23 04/04/23  Nafziger, Kandee Keen, NP  atorvastatin (LIPITOR) 80 MG tablet TAKE 1 TABLET BY MOUTH DAILY 10/19/22   Nafziger, Kandee Keen, NP  buPROPion (WELLBUTRIN XL) 300 MG 24 hr tablet TAKE 1 TABLET BY MOUTH DAILY 05/16/22   Nafziger, Kandee Keen, NP  carvedilol (COREG) 6.25 MG tablet Take 1 tablet (6.25 mg total) by mouth 2 (two) times daily with a meal. 05/16/22   Meriam Sprague, MD  conjugated estrogens (PREMARIN) vaginal cream Place 0.5 Applicatorfuls vaginally every 7 (seven) days. 12/19/19   Nafziger, Kandee Keen, NP  denosumab (PROLIA) 60 MG/ML SOSY injection Inject 60 mg into the skin every 6 (six) months.    [provider]  Diclofenac Sodium 1 % CREA Apply topically.    [provider]  eszopiclone (LUNESTA) 1 MG TABS tablet Take 1 tablet (1 mg total) by mouth at bedtime as needed for sleep. Take immediately before bedtime 01/03/23   Nafziger, Kandee Keen, NP  furosemide (LASIX) 20 MG tablet Take 1 tablet (20 mg total) by mouth 2 (two) times daily. 01/05/23   Shari Prows,  Kathlynn Grate, MD  JARDIANCE 25 MG TABS tablet TAKE 1 TABLET BY MOUTH DAILY  BEFORE BREAKFAST 12/01/22   Nafziger, Kandee Keen, NP  levothyroxine (SYNTHROID) 50 MCG tablet TAKE 1 TABLET BY MOUTH DAILY  BEFORE BREAKFAST 11/10/22   Nafziger, Kandee Keen, NP  nitroGLYCERIN (NITROSTAT) 0.4 MG SL tablet DISSOLVE 1 TABLET UNDER THE TONGUE EVERY 5 MINUTES AS&nbsp;&nbsp;NEEDED FOR CHEST PAIN. MAX&nbsp;&nbsp;3 DOSES IN 15 MINUTES. CALL 911 IF CHEST PAIN PERSISTS 01/31/23   Sharlene Dory, PA-C  pioglitazone (ACTOS) 45 MG tablet Take 1 tablet (45 mg total) by mouth daily. 01/04/23   Nafziger, Kandee Keen, NP  potassium chloride SA (KLOR-CON M) 20 MEQ tablet Take 2 tablets (40 mEq total) by mouth daily. 12/12/22   Meriam Sprague, MD  propranolol (INDERAL) 10 MG tablet Take 1 tablet (10 mg total) by mouth 2 (two) times daily as  needed (palpitations). 11/01/22   Meriam Sprague, MD  spironolactone (ALDACTONE) 25 MG tablet Take 0.5 tablets (12.5 mg total) by mouth daily. Patient taking differently: Take 25 mg by mouth daily. 01/03/23   Meriam Sprague, MD      Allergies    Sulfa antibiotics and Sulfonamide derivatives    Review of Systems   Review of Systems A 10 point review of systems was performed and is negative unless otherwise reported in HPI.  Physical Exam Updated Vital Signs BP (!) 131/59 (BP Location: Right Arm)   Pulse (!) 56   Temp 97.8 F (36.6 C) (Oral)   Resp 17   Ht 4\' 10"  (1.473 m)   Wt 61.7 kg   SpO2 99%   BMI 28.43 kg/m  Physical Exam General: Normal appearing female, lying in bed.  HEENT: PERRLA, Sclera anicteric, MMM, trachea midline.  Cardiology: RRR, no murmurs/rubs/gallops. Resp: Normal respiratory rate and effort. CTAB, no wheezes, rhonchi, crackles.  Abd: Soft, non-tender, non-distended. No rebound tenderness or guarding.  GU: Deferred. MSK: No peripheral edema or signs of trauma.  Skin: warm, dry.  Back: No evidence of trauma, no erythema/induration, fluctuance, rashes.  No midline CT or L-spine tenderness to palpation.  Right paraspinal tenderness with tenderness midline over the sacrum and right iliosacral joint. Neg straight leg raise Neuro: A&Ox4, CNs II-XII grossly intact.  5 out of 5 strength left lower extremity hip flexion/extension, knee flexion/extension, plantarflexion/dorsiflexion.  4/5 strength noted on all right lower extremity hip flexion/extension, knee flexion/extension, plantarflexion/dorsiflexion. Sensation grossly intact.  Psych: Normal mood and affect.   ED Results / Procedures / Treatments   Labs (all labs ordered are listed, but only abnormal results are displayed) Labs Reviewed  CBC WITH DIFFERENTIAL/PLATELET - Abnormal; Notable for the following components:      Result Value   Hemoglobin 15.1 (*)    HCT 46.8 (*)    All other components  within normal limits  COMPREHENSIVE METABOLIC PANEL - Abnormal; Notable for the following components:   Glucose, Bld 136 (*)    Total Protein 6.3 (*)    Anion gap 16 (*)    All other components within normal limits    EKG None  Radiology MR Lumbar Spine W Wo Contrast  Result Date: 01/31/2023 CLINICAL DATA:  Low back pain, recent epidural and worsening back pain chronic EXAM: MRI LUMBAR SPINE WITHOUT AND WITH CONTRAST TECHNIQUE: Multiplanar and multiecho pulse sequences of the lumbar spine were obtained without and with intravenous contrast. CONTRAST:  6mL GADAVIST GADOBUTROL 1 MMOL/ML IV SOLN COMPARISON:  No prior MRI available, correlation is made with same day CT  lumbar spine FINDINGS: Segmentation:  5 lumbar type vertebral bodies. Alignment: Trace retrolisthesis of T11 on T12, T12 on L1, L1 on L2, and L2 on L3. Grade 1 anterolisthesis of L4 on L5 and L5 on S1. dextrocurvature of the lower thoracic spine. Levocurvature of the upper lumbar spine, with compensatory dextrocurvature of the lower lumbar spine. Vertebrae: No acute fracture, evidence of discitis, or suspicious osseous lesion. No abnormal enhancement. Conus medullaris and cauda equina: Conus extends to the L1-L2 level. Conus and cauda equina appear normal. No abnormal nerve root enhancement. At the dorsal aspect of the thecal sac, there are several areas of susceptibility, which correlate with foci of air on the prior exam (series 10, image 22, 25, 26, and 29). These appear to be within the epidural fat, and appears separate from the ligamentum flavum and facet joints. No associated enhancement. Paraspinal and other soft tissues: Atrophy of the inferior paraspinous musculature. No lymphadenopathy. Disc levels: T11-T12: Seen only on the sagittal images. Trace retrolisthesis with large right subarticular and foraminal disc protrusion, which correlates with the previously noted osseous protrusion on the CT. This likely deviates the cord and  causes at least mild spinal canal stenosis, although this level is not included on the axial images. Mild-to-moderate bilateral neural foraminal narrowing. T12-L1: Trace retrolisthesis and mild disc bulge. Mild facet arthropathy. No spinal canal stenosis. No neural foraminal narrowing. L1-L2: Trace retrolisthesis and disc osteophyte complex with left eccentric disc bulge and right eccentric osteophytes. Mild-to-moderate facet arthropathy. Narrowing of the lateral recesses. Mild spinal canal stenosis. Moderate right neural foraminal narrowing. L2-L3: Trace retrolisthesis and disc osteophyte complex right eccentric osteophytes. Moderate right and mild left facet arthropathy. Narrowing of the lateral recesses. No spinal canal stenosis. Mild-to-moderate right neural foraminal narrowing. L3-L4: Disc height loss and disc osteophyte complex. Severe left and moderate right facet arthropathy. Narrowing of the lateral recesses. Right greater than left ligamentum flavum hypertrophy. No spinal canal stenosis. Mild right and moderate to severe left neural foraminal narrowing. L4-L5: Grade 1 anterolisthesis with disc unroofing. Left eccentric disc bulge. The lateral aspects of the disc bulge likely contacts the exiting L4 nerves. Severe facet arthropathy. Narrowing of the lateral recesses. No spinal canal stenosis. Mild right and mild-to-moderate left neural foraminal narrowing. L5-S1: Grade 1 anterolisthesis with disc unroofing and right eccentric disc bulge, which may contact the exiting L5 nerves. Severe right greater than left facet arthropathy. No spinal canal stenosis or neural foraminal narrowing. IMPRESSION: 1. Foci of susceptibility on this exam correlate with foci of air on the prior exam do not demonstrate abnormal enhancement to suggest an infectious etiology, nor do they appear to originate from the facet or ligamentum flavum, favored to be iatrogenic, secondary to recent procedure. 2. T11-T12 large right  subarticular and foraminal disc protrusion, which likely deviates the cord and causes at least mild spinal canal stenosis, although this level is not included on the axial images. Mild-to-moderate bilateral neural foraminal narrowing at this level. 3. L1-L2 mild spinal canal stenosis with moderate right neural foraminal narrowing. 4. L2-L3 mild-to-moderate right neural foraminal narrowing. 5. L3-L4 moderate to severe left and mild right neural foraminal narrowing. 6. L4-L5 mild-to-moderate left and mild right neural foraminal narrowing. 7. Narrowing of the lateral recesses at multiple levels, which could affect the descending L2, L3, L4, and L5 nerves, respectively. 8. Multilevel facet arthropathy, which is severe at L3-L4, L4-L5, and L5-S1. Electronically Signed   By: Wiliam Ke M.D.   On: 01/31/2023 18:45   CT Lumbar  Spine Wo Contrast  Result Date: 01/31/2023 CLINICAL DATA:  Low back EXAM: CT LUMBAR SPINE WITHOUT CONTRAST TECHNIQUE: Multidetector CT imaging of the lumbar spine was performed without intravenous contrast administration. Multiplanar CT image reconstructions were also generated. RADIATION DOSE REDUCTION: This exam was performed according to the departmental dose-optimization program which includes automated exposure control, adjustment of the mA and/or kV according to patient size and/or use of iterative reconstruction technique. COMPARISON:  No prior lumbar spine CT available, correlation is made with CT abdomen pelvis 09/13/2022 FINDINGS: Segmentation: 5 lumbar type vertebral bodies. Alignment: Levocurvature of the lumbar spine. 3 mm retrolisthesis T11 on T12. 5 mm anterolisthesis of L4 on L5 and L5 on S1. 4 mm right lateral listhesis of L1 on L2. 4 mm left lateral listhesis of L2 on L3. 4 mm left lateral listhesis of L3 on L4. Vertebrae: No acute fracture or focal pathologic process. Endplate degenerative changes, eccentric to the right at L2-L3 and L3-L4. Left eccentric degenerative changes  at L4-L5. Paraspinal and other soft tissues: Aortic atherosclerosis. Diverticulosis without evidence of diverticulitis. Status post cholecystectomy with prominence of the common bile duct, similar to the prior CT. Disc levels: T11-T12: Trace retrolisthesis. Disc height loss and small disc osteophyte complex, with superimposed osseous protrusion at the posterior right aspect the superior T12 vertebral body. This appears similar compared to the 2013 abdomen pelvis CT. This causes leftward spinal cord deviation and moderate spinal canal stenosis. Moderate left and mild right facet arthropathy. Severe left neural foraminal narrowing. T12-L1: Disc height loss and small disc osteophyte complex with superimposed left foraminal and extreme lateral disc protrusion. Mild facet arthropathy. No spinal canal stenosis. Mild left neural foraminal narrowing. L1-L2: Disc height loss and disc osteophyte complex with left subarticular and foraminal disc protrusion and right eccentric osteophytes. Moderate left and mild right facet arthropathy. Narrowing of the bilateral lateral recesses. Mild spinal canal stenosis. Moderate right neural foraminal narrowing. L2-L3: Severe disc height loss with disc osteophyte complex and right eccentric osteophytes. Moderate right and mild left facet arthropathy. Narrowing of the right lateral recess. Mild spinal canal stenosis. Moderate right neural foraminal narrowing. L3-L4: Disc height loss and disc osteophyte complex. Severe left and mild right facet arthropathy. Narrowing of the lateral recesses. Ligamentum flavum hypertrophy. Mild spinal canal stenosis. Mild bilateral neural foraminal narrowing. L4-L5: Grade 1 anterolisthesis with disc unroofing and severe disc height loss. Left eccentric disc bulge. Severe facet arthropathy scratch than. Air is noted along the posterior aspect of the spinal canal (series 7, image 79 and series 11, image 43), which appears epidural. Narrowing of the lateral  recesses. Mild spinal canal stenosis. Mild left neural foraminal narrowing. L5-S1: Grade 1 anterolisthesis with disc unroofing and right eccentric disc bulge. Severe facet arthropathy. Air is noted along posterior aspect of the spinal canal (series 7, image 90 and 96 and series 11, images 39-40), which appears epidural. No spinal canal stenosis. No significant neural foraminal narrowing. IMPRESSION: 1. Air is noted along the posterior aspect of the spinal canal at L4-L5 and L5-S1, which may be degenerative and related to facet or ligamentum flavum cysts, but, in the setting of recent procedure, is concerning for an iatrogenic etiology. Infection with gas-forming organisms is also possible but felt to be less likely given the lack of additional inflammatory changes. Correlate with lab values and consider MRI with and without contrast if clinically indicated. 2. T11-T12 moderate spinal canal stenosis and severe left neural foraminal narrowing. 3. L1-L2 and L2-L3 mild spinal canal stenosis  and moderate right neural foraminal narrowing. 4. L3-L4 mild spinal canal stenosis and mild bilateral neural foraminal narrowing. 5. L4-L5 mild spinal canal stenosis and mild left neural foraminal narrowing. 6. Narrowing of the lateral recesses at L1-L2, L2-L3, L3-L4, and L4-L5, which can affect the descending L2, L3, L4, and L5 nerve roots, respectively. Electronically Signed   By: Wiliam Ke M.D.   On: 01/31/2023 14:53    Procedures Procedures    Medications Ordered in ED Medications  lidocaine (LIDODERM) 5 % 1 patch (1 patch Transdermal Patch Applied 01/31/23 1657)  oxyCODONE (Oxy IR/ROXICODONE) immediate release tablet 5 mg (has no administration in time range)  acetaminophen (TYLENOL) tablet 1,000 mg (1,000 mg Oral Given 01/31/23 1658)  ketorolac (TORADOL) 15 MG/ML injection 15 mg (15 mg Intravenous Given 01/31/23 1708)  gadobutrol (GADAVIST) 1 MMOL/ML injection 6 mL (6 mLs Intravenous Contrast Given 01/31/23 1815)     ED Course/ Medical Decision Making/ A&P                          Medical Decision Making Amount and/or Complexity of Data Reviewed Radiology: ordered.  Risk OTC drugs. Prescription drug management.    This patient presents to the ED for concern of back pain, this involves an extensive number of treatment options, and is a complaint that carries with it a high risk of complications and morbidity.  I considered the following differential and admission for this acute, potentially life threatening condition.   MDM:    Patient with new back pain after epidural that is not characteristic of her chronic back pain, very severe.  She has no reported focal neurodeficits however her physical exam demonstrates 4-5 strength in her right lower extremity, she states this is because of pain but it is objectively weaker.  CT lumbar spine ordered and triage demonstrates air in the spinal column which is likely a sequelae of the epidural that she had, less likely gas-forming organism as she is very well-appearing and without any fever or leukocytosis.  She denies any other signs of cauda equina including saddle anesthesia or bowel/bladder incontinence.  Patient could potentially have spinal epidural hematoma postprocedure. She has no f/c to indicate transverse myelitis, discitis, spinal epidural abscess. No e/o cellulitis or abscess at the skin site of the epidural.  She has no h/o malignancy to raise concern for spinal mets.  Also consider sacroiliitis or sacroiliac dislocation though she had no trauma, given the site of patient's tenderness palpation on exam.  I discussed with the patient and her husband and given the completely uncharacteristic nature of her her new acute pain post epidural I do believe MRI is warranted.  Will order MRI with and without contrast of lumbar spine.  Clinical Course as of 01/31/23 1957  Wed Jan 31, 2023  1952 Patient with no acute findings on her MRI.  Likely this is her  chronic spinal stenosis.  No epidural hematoma noted.  No evidence of infection in her spine either.  She feels improved after the Toradol, Tylenol, lidocaine patch, and Robaxin.  Will prescribe Robaxin as outpatient and instruct patient to add Tylenol to her regimen as she is only taking ibuprofen at home.  Also encouraged ice and heat.  Encouraged her to follow-up with her spine doctor within the next 1 to 2 weeks.  Patient given discharge instructions and return precautions, all questions answered to patient satisfaction. She requests a 2nd opinion and would like to see a neurosurgeon.  Given info to call [facility]. [HN]    Clinical Course User Index [HN] Loetta Rough, MD    Labs: I Ordered, and personally interpreted labs.  The pertinent results include:  those listed above  Imaging Studies ordered: CTLumbar spine ordered from triage. I ordered MRI lumbar spine w and without contrast I independently visualized and interpreted imaging. I agree with the radiologist interpretation  Additional history obtained from chart review, husband at bedside.    Reevaluation: After the interventions noted above, I reevaluated the patient and found that they have :improved  Social Determinants of Health: Lives independently  Disposition:  DC  Co morbidities that complicate the patient evaluation  Past Medical History:  Diagnosis Date   Asthma    extrinsic   CAD (coronary artery disease)    Chest pain, atypical    Complication of anesthesia    Diabetes (HCC)    Edema    Fatigue    Fatigue    GERD (gastroesophageal reflux disease)    Hyperlipidemia    Hypertension    familiar   Hypothyroidism    IBS (irritable bowel syndrome)    hx   Keratosis, seborrheic    Labyrinthitis    MI (myocardial infarction) (HCC)    age 52   Osteoarthritis    PONV (postoperative nausea and vomiting)    S/P arthroscopy of right shoulder 03/01/2018   Skin cancer    Melanoma       Medicines Meds ordered this encounter  Medications   acetaminophen (TYLENOL) tablet 1,000 mg   ketorolac (TORADOL) 15 MG/ML injection 15 mg   lidocaine (LIDODERM) 5 % 1 patch   oxyCODONE (Oxy IR/ROXICODONE) immediate release tablet 5 mg   gadobutrol (GADAVIST) 1 MMOL/ML injection 6 mL   methocarbamol (ROBAXIN) 500 MG tablet    Sig: Take 1 tablet (500 mg total) by mouth 2 (two) times daily.    Dispense:  20 tablet    Refill:  0    I have reviewed the patients home medicines and have made adjustments as needed  Problem List / ED Course: Problem List Items Addressed This Visit   None Visit Diagnoses     Spinal stenosis of lumbar region without neurogenic claudication    -  Primary   Acute right-sided low back pain without sciatica       Relevant Medications   acetaminophen (TYLENOL) tablet 1,000 mg (Completed)   ketorolac (TORADOL) 15 MG/ML injection 15 mg (Completed)   oxyCODONE (Oxy IR/ROXICODONE) immediate release tablet 5 mg   methocarbamol (ROBAXIN) 500 MG tablet                   This note was created using dictation software, which may contain spelling or grammatical errors.    Loetta Rough, MD 01/31/23 1953    Loetta Rough, MD 01/31/23 [phone]

## 2023-02-02 ENCOUNTER — Telehealth: Payer: Self-pay | Admitting: Adult Health

## 2023-02-02 DIAGNOSIS — E119 Type 2 diabetes mellitus without complications: Secondary | ICD-10-CM

## 2023-02-02 MED ORDER — ACCU-CHEK GUIDE VI STRP
1.0000 | ORAL_STRIP | Freq: Two times a day (BID) | 3 refills | Status: DC
Start: 2023-02-02 — End: 2023-03-16

## 2023-02-02 NOTE — Telephone Encounter (Signed)
Pt is calling and insurance will not pay for BS testing 4 times a day. Please send new directions insurance will pay for twice a day ACCU-CHEK GUIDE test strip  [id number] [place] [facility], [place] - [address] AT [facility] [address] Phone: [phone]  Fax: [phone]

## 2023-02-02 NOTE — Telephone Encounter (Signed)
Noted! Rx sent in for BID

## 2023-02-08 DIAGNOSIS — M5416 Radiculopathy, lumbar region: Secondary | ICD-10-CM | POA: Diagnosis not present

## 2023-02-08 DIAGNOSIS — M47816 Spondylosis without myelopathy or radiculopathy, lumbar region: Secondary | ICD-10-CM | POA: Diagnosis not present

## 2023-02-08 NOTE — Telephone Encounter (Signed)
Patient asked for these to be sent to  [facility] #[id number] [facility], [place] - [address] AT [facility] [address] Phone: [phone]  Fax: [phone]

## 2023-02-08 NOTE — Addendum Note (Signed)
Addended by: Waymon Amato R on: 02/08/2023 04:09 PM   Modules accepted: Orders

## 2023-02-09 ENCOUNTER — Telehealth: Payer: Self-pay

## 2023-02-09 NOTE — Telephone Encounter (Signed)
Transition Care Management Follow-up Telephone Call Date of discharge and from where: 01/31/2023 [facility] How have you been since you were released from the hospital? Patient is not feeling any better, she is still in pain. Any questions or concerns? No  Items Reviewed: Did the pt receive and understand the discharge instructions provided? Yes  Medications obtained and verified? Yes  Other?  Patient stated she has contacted her insurance to see if they will cover the lidocaine patches. She is waiting for a response from them. Any new allergies since your discharge? No  Dietary orders reviewed? Yes Do you have support at home? Yes   Follow up appointments reviewed:  PCP Hospital f/u appt confirmed? No  Scheduled to see  on  @ . Specialist Hospital f/u appt confirmed?  Patient stated she has an appointment with a Neurologist 02/13/23.   Scheduled to see  on  @ . Are transportation arrangements needed? No  If their condition worsens, is the pt aware to call PCP or go to the Emergency Dept.? Yes Was the patient provided with contact information for the PCP's office or ED? Yes Was to pt encouraged to call back with questions or concerns? Yes  Heru Montz [facility]  [facility] Population Health Community Resource Care Guide   [email or web address]  ?? [phone]   Website: [email or web address]  [email or web address]

## 2023-02-09 NOTE — Telephone Encounter (Signed)
Can someone assist with this please?

## 2023-02-09 NOTE — Telephone Encounter (Signed)
Pt is calling back and need PA for testing strips

## 2023-02-13 DIAGNOSIS — Z6828 Body mass index (BMI) 28.0-28.9, adult: Secondary | ICD-10-CM | POA: Diagnosis not present

## 2023-02-13 DIAGNOSIS — M5416 Radiculopathy, lumbar region: Secondary | ICD-10-CM | POA: Diagnosis not present

## 2023-02-14 DIAGNOSIS — M5114 Intervertebral disc disorders with radiculopathy, thoracic region: Secondary | ICD-10-CM | POA: Diagnosis not present

## 2023-02-14 DIAGNOSIS — M5414 Radiculopathy, thoracic region: Secondary | ICD-10-CM | POA: Diagnosis not present

## 2023-02-14 DIAGNOSIS — M4804 Spinal stenosis, thoracic region: Secondary | ICD-10-CM | POA: Diagnosis not present

## 2023-02-19 ENCOUNTER — Telehealth: Payer: Self-pay

## 2023-02-19 ENCOUNTER — Other Ambulatory Visit (HOSPITAL_COMMUNITY): Payer: Self-pay

## 2023-02-19 NOTE — Telephone Encounter (Signed)
Error

## 2023-02-19 NOTE — Telephone Encounter (Signed)
CMN form sent to [facility]. She will have provider complete and sign form and send back to [facility].

## 2023-02-19 NOTE — Telephone Encounter (Addendum)
Pt is very upset and says it gets done today or she will get another provider at another practice and she will report NP.   [facility] [phone] [place], [place] - [address] AT [facility] [address] Phone: [phone]  Fax: [phone]     Accucheck to check twice a day or Medicare will not approve it.  Pt was informed that NP does not work on Mondays. Pt stated she does not care.  Pt would like a call back once it is done!!

## 2023-02-20 NOTE — Telephone Encounter (Signed)
Spoke to pt and advise that message was routed to another PA team by mistake. Pt also advised that PPW was received filled out and faxed

## 2023-02-20 NOTE — Telephone Encounter (Signed)
Fax confirmation received. 

## 2023-02-21 NOTE — Telephone Encounter (Signed)
Spoke to pt and she stated the pharmacy still has issues. I offered to call pharmacy but pt stated she will call. Awaiting return call to see howwe can help.

## 2023-02-21 NOTE — Telephone Encounter (Signed)
Pharmacist called and stated that a question was missed on the form. Question answered on form and faxed back awaiting confirmation.

## 2023-02-22 NOTE — Telephone Encounter (Signed)
Spoke to pharmacist and she stated the firm they sent me was dated and insurance will not accept it. Pharmacist is sending another form now. Pt notified of update.

## 2023-02-22 NOTE — Telephone Encounter (Signed)
Confirmation received. Will call later to check on the status.

## 2023-02-23 NOTE — Telephone Encounter (Signed)
Spoke to pharmacist and she stated insurance approved the prescription. Pt can now pick up her prescription.

## 2023-02-23 NOTE — Telephone Encounter (Signed)
Confirmation received. Will call the pharmacy for update.

## 2023-02-23 NOTE — Telephone Encounter (Signed)
Patient notified of update  and verbalized understanding. 

## 2023-02-23 NOTE — Telephone Encounter (Signed)
Received new form. Form has been faxed waiting confirmation.

## 2023-02-26 DIAGNOSIS — M5416 Radiculopathy, lumbar region: Secondary | ICD-10-CM | POA: Diagnosis not present

## 2023-02-26 DIAGNOSIS — M6281 Muscle weakness (generalized): Secondary | ICD-10-CM | POA: Diagnosis not present

## 2023-03-01 DIAGNOSIS — M6281 Muscle weakness (generalized): Secondary | ICD-10-CM | POA: Diagnosis not present

## 2023-03-01 DIAGNOSIS — M5416 Radiculopathy, lumbar region: Secondary | ICD-10-CM | POA: Diagnosis not present

## 2023-03-05 DIAGNOSIS — M6281 Muscle weakness (generalized): Secondary | ICD-10-CM | POA: Diagnosis not present

## 2023-03-05 DIAGNOSIS — E119 Type 2 diabetes mellitus without complications: Secondary | ICD-10-CM | POA: Diagnosis not present

## 2023-03-05 DIAGNOSIS — M5416 Radiculopathy, lumbar region: Secondary | ICD-10-CM | POA: Diagnosis not present

## 2023-03-05 DIAGNOSIS — H5203 Hypermetropia, bilateral: Secondary | ICD-10-CM | POA: Diagnosis not present

## 2023-03-05 DIAGNOSIS — Z961 Presence of intraocular lens: Secondary | ICD-10-CM | POA: Diagnosis not present

## 2023-03-05 LAB — HM DIABETES EYE EXAM

## 2023-03-06 DIAGNOSIS — M5416 Radiculopathy, lumbar region: Secondary | ICD-10-CM | POA: Diagnosis not present

## 2023-03-06 DIAGNOSIS — Z6829 Body mass index (BMI) 29.0-29.9, adult: Secondary | ICD-10-CM | POA: Diagnosis not present

## 2023-03-07 DIAGNOSIS — M6281 Muscle weakness (generalized): Secondary | ICD-10-CM | POA: Diagnosis not present

## 2023-03-07 DIAGNOSIS — M5416 Radiculopathy, lumbar region: Secondary | ICD-10-CM | POA: Diagnosis not present

## 2023-03-12 DIAGNOSIS — M6281 Muscle weakness (generalized): Secondary | ICD-10-CM | POA: Diagnosis not present

## 2023-03-12 DIAGNOSIS — M5416 Radiculopathy, lumbar region: Secondary | ICD-10-CM | POA: Diagnosis not present

## 2023-03-13 DIAGNOSIS — M5416 Radiculopathy, lumbar region: Secondary | ICD-10-CM | POA: Diagnosis not present

## 2023-03-16 ENCOUNTER — Other Ambulatory Visit: Payer: Self-pay

## 2023-03-16 DIAGNOSIS — E119 Type 2 diabetes mellitus without complications: Secondary | ICD-10-CM

## 2023-03-16 MED ORDER — ACCU-CHEK GUIDE VI STRP
1.0000 | ORAL_STRIP | Freq: Two times a day (BID) | 0 refills | Status: AC
Start: 2023-03-16 — End: 2024-04-25

## 2023-03-23 DIAGNOSIS — M6281 Muscle weakness (generalized): Secondary | ICD-10-CM | POA: Diagnosis not present

## 2023-03-23 DIAGNOSIS — M5416 Radiculopathy, lumbar region: Secondary | ICD-10-CM | POA: Diagnosis not present

## 2023-03-28 DIAGNOSIS — M5416 Radiculopathy, lumbar region: Secondary | ICD-10-CM | POA: Diagnosis not present

## 2023-03-28 DIAGNOSIS — M6281 Muscle weakness (generalized): Secondary | ICD-10-CM | POA: Diagnosis not present

## 2023-03-30 DIAGNOSIS — M6281 Muscle weakness (generalized): Secondary | ICD-10-CM | POA: Diagnosis not present

## 2023-03-30 DIAGNOSIS — M5416 Radiculopathy, lumbar region: Secondary | ICD-10-CM | POA: Diagnosis not present

## 2023-04-02 DIAGNOSIS — M6281 Muscle weakness (generalized): Secondary | ICD-10-CM | POA: Diagnosis not present

## 2023-04-02 DIAGNOSIS — M5416 Radiculopathy, lumbar region: Secondary | ICD-10-CM | POA: Diagnosis not present

## 2023-04-09 DIAGNOSIS — M6281 Muscle weakness (generalized): Secondary | ICD-10-CM | POA: Diagnosis not present

## 2023-04-09 DIAGNOSIS — M5416 Radiculopathy, lumbar region: Secondary | ICD-10-CM | POA: Diagnosis not present

## 2023-04-16 DIAGNOSIS — M6281 Muscle weakness (generalized): Secondary | ICD-10-CM | POA: Diagnosis not present

## 2023-04-16 DIAGNOSIS — M5416 Radiculopathy, lumbar region: Secondary | ICD-10-CM | POA: Diagnosis not present

## 2023-04-19 DIAGNOSIS — Z23 Encounter for immunization: Secondary | ICD-10-CM | POA: Diagnosis not present

## 2023-05-01 ENCOUNTER — Ambulatory Visit: Payer: Medicare Other | Admitting: Family Medicine

## 2023-05-01 NOTE — Progress Notes (Signed)
Error. Patient completed previsit questionnaire with staff, but told staff was in car, had bad day as husband had injury and had to get surgery. At appt time coalled pt at multiple numbers but there was not answer. Called multiple times. LM.

## 2023-05-08 ENCOUNTER — Telehealth: Payer: Self-pay | Admitting: Adult Health

## 2023-05-08 NOTE — Telephone Encounter (Signed)
Please advise 

## 2023-05-08 NOTE — Telephone Encounter (Signed)
[facility] [id number]  Re:  levothyroxine (SYNTHROID) 50 MCG tablet  Would like NP's approval to change manufacturer to Lupin  Ref# [id number]

## 2023-05-09 NOTE — Telephone Encounter (Signed)
Spoke to [facility] and informed them of Rebecca [name]'s message below.

## 2023-05-20 ENCOUNTER — Other Ambulatory Visit: Payer: Self-pay | Admitting: Adult Health

## 2023-05-21 ENCOUNTER — Other Ambulatory Visit: Payer: Self-pay

## 2023-05-21 MED ORDER — CARVEDILOL 6.25 MG PO TABS: 6.2500 mg | ORAL_TABLET | Freq: Two times a day (BID) | ORAL | 2 refills | Status: DC

## 2023-05-22 ENCOUNTER — Other Ambulatory Visit: Payer: Self-pay | Admitting: Adult Health

## 2023-05-22 DIAGNOSIS — G479 Sleep disorder, unspecified: Secondary | ICD-10-CM

## 2023-05-22 DIAGNOSIS — G4700 Insomnia, unspecified: Secondary | ICD-10-CM

## 2023-06-01 ENCOUNTER — Telehealth: Payer: Self-pay

## 2023-06-01 NOTE — Telephone Encounter (Signed)
Pt ready for scheduling 05/30/23.  Estimated out-of-pocket cost due at time of visit: $0  Primary Insurance: [employer] co-insurance: 0%  Deductible: $240 out of $240  This summary of benefits is an estimation of the patient's out-of-pocket cost. Exact cost may vary based on individual plan coverage.

## 2023-06-06 NOTE — Telephone Encounter (Signed)
I spoke with patient, scheduled for Friday at 10am.

## 2023-06-08 ENCOUNTER — Ambulatory Visit: Payer: Medicare Other

## 2023-06-08 DIAGNOSIS — M810 Age-related osteoporosis without current pathological fracture: Secondary | ICD-10-CM

## 2023-06-08 MED ORDER — DENOSUMAB 60 MG/ML ~~LOC~~ SOSY
60.0000 mg | PREFILLED_SYRINGE | Freq: Once | SUBCUTANEOUS | Status: AC
Start: 2023-06-08 — End: 2023-06-08
  Administered 2023-06-08: 60 mg via SUBCUTANEOUS

## 2023-06-08 NOTE — Patient Instructions (Signed)
Health Maintenance Due  Topic Date Due   DTaP/Tdap/Td (3 - Td or Tdap) 05/15/2021   FOOT EXAM  12/21/2021   Medicare Annual Wellness (AWV)  04/27/2023   COVID-19 Vaccine (6 - 2023-24 season) 06/12/2023       01/03/2023    1:22 PM 01/03/2023    1:21 PM 05/18/2022    7:32 AM  Depression screen PHQ 2/9  Decreased Interest 0 0 1  Down, Depressed, Hopeless 0 0 0  PHQ - 2 Score 0 0 1  Altered sleeping   2  Tired, decreased energy   2  Change in appetite   2  Feeling bad or failure about yourself    2  Trouble concentrating   2  Moving slowly or fidgety/restless   0  Suicidal thoughts   0  PHQ-9 Score   11  Difficult doing work/chores   Somewhat difficult

## 2023-06-08 NOTE — Progress Notes (Signed)
Per orders of [employer], injection of Prolia given to pt in R arm by Sherrin Daisy. Patient tolerated injection well.

## 2023-06-19 ENCOUNTER — Other Ambulatory Visit: Payer: Self-pay | Admitting: Adult Health

## 2023-06-19 DIAGNOSIS — F32A Depression, unspecified: Secondary | ICD-10-CM

## 2023-07-03 DIAGNOSIS — Z85828 Personal history of other malignant neoplasm of skin: Secondary | ICD-10-CM | POA: Diagnosis not present

## 2023-07-03 DIAGNOSIS — L814 Other melanin hyperpigmentation: Secondary | ICD-10-CM | POA: Diagnosis not present

## 2023-07-03 DIAGNOSIS — D225 Melanocytic nevi of trunk: Secondary | ICD-10-CM | POA: Diagnosis not present

## 2023-07-03 DIAGNOSIS — Z86018 Personal history of other benign neoplasm: Secondary | ICD-10-CM | POA: Diagnosis not present

## 2023-07-03 DIAGNOSIS — L578 Other skin changes due to chronic exposure to nonionizing radiation: Secondary | ICD-10-CM | POA: Diagnosis not present

## 2023-07-03 DIAGNOSIS — Z85820 Personal history of malignant melanoma of skin: Secondary | ICD-10-CM | POA: Diagnosis not present

## 2023-07-03 DIAGNOSIS — L821 Other seborrheic keratosis: Secondary | ICD-10-CM | POA: Diagnosis not present

## 2023-07-30 ENCOUNTER — Ambulatory Visit: Payer: Medicare Other | Attending: Internal Medicine | Admitting: Internal Medicine

## 2023-08-06 ENCOUNTER — Ambulatory Visit: Payer: Medicare Other | Admitting: Internal Medicine

## 2023-08-29 ENCOUNTER — Other Ambulatory Visit: Payer: Self-pay | Admitting: Adult Health

## 2023-08-29 DIAGNOSIS — E782 Mixed hyperlipidemia: Secondary | ICD-10-CM

## 2023-08-29 DIAGNOSIS — I2510 Atherosclerotic heart disease of native coronary artery without angina pectoris: Secondary | ICD-10-CM

## 2023-09-19 DIAGNOSIS — Z6828 Body mass index (BMI) 28.0-28.9, adult: Secondary | ICD-10-CM | POA: Diagnosis not present

## 2023-09-19 DIAGNOSIS — I5032 Chronic diastolic (congestive) heart failure: Secondary | ICD-10-CM | POA: Diagnosis not present

## 2023-09-19 DIAGNOSIS — M810 Age-related osteoporosis without current pathological fracture: Secondary | ICD-10-CM | POA: Diagnosis not present

## 2023-09-19 DIAGNOSIS — I110 Hypertensive heart disease with heart failure: Secondary | ICD-10-CM | POA: Diagnosis not present

## 2023-09-19 DIAGNOSIS — E1151 Type 2 diabetes mellitus with diabetic peripheral angiopathy without gangrene: Secondary | ICD-10-CM | POA: Diagnosis not present

## 2023-09-19 DIAGNOSIS — I2510 Atherosclerotic heart disease of native coronary artery without angina pectoris: Secondary | ICD-10-CM | POA: Diagnosis not present

## 2023-09-19 DIAGNOSIS — J300 Vasomotor rhinitis: Secondary | ICD-10-CM | POA: Diagnosis not present

## 2023-09-19 DIAGNOSIS — R002 Palpitations: Secondary | ICD-10-CM | POA: Diagnosis not present

## 2023-09-19 DIAGNOSIS — E663 Overweight: Secondary | ICD-10-CM | POA: Diagnosis not present

## 2023-09-19 DIAGNOSIS — F39 Unspecified mood [affective] disorder: Secondary | ICD-10-CM | POA: Diagnosis not present

## 2023-09-19 DIAGNOSIS — E039 Hypothyroidism, unspecified: Secondary | ICD-10-CM | POA: Diagnosis not present

## 2023-09-19 DIAGNOSIS — M519 Unspecified thoracic, thoracolumbar and lumbosacral intervertebral disc disorder: Secondary | ICD-10-CM | POA: Diagnosis not present

## 2023-10-11 ENCOUNTER — Other Ambulatory Visit: Payer: Self-pay | Admitting: Adult Health

## 2023-10-11 DIAGNOSIS — E119 Type 2 diabetes mellitus without complications: Secondary | ICD-10-CM

## 2023-10-17 DIAGNOSIS — Z23 Encounter for immunization: Secondary | ICD-10-CM | POA: Diagnosis not present

## 2023-11-08 ENCOUNTER — Ambulatory Visit: Payer: Medicare Other | Attending: Internal Medicine | Admitting: Internal Medicine

## 2023-11-08 ENCOUNTER — Encounter: Payer: Self-pay | Admitting: Internal Medicine

## 2023-11-08 VITALS — BP 122/80 | HR 67 | Ht <= 58 in | Wt 137.0 lb

## 2023-11-08 DIAGNOSIS — E785 Hyperlipidemia, unspecified: Secondary | ICD-10-CM | POA: Diagnosis not present

## 2023-11-08 DIAGNOSIS — I2510 Atherosclerotic heart disease of native coronary artery without angina pectoris: Secondary | ICD-10-CM | POA: Insufficient documentation

## 2023-11-08 DIAGNOSIS — R5383 Other fatigue: Secondary | ICD-10-CM | POA: Diagnosis not present

## 2023-11-08 DIAGNOSIS — I5032 Chronic diastolic (congestive) heart failure: Secondary | ICD-10-CM | POA: Diagnosis not present

## 2023-11-08 DIAGNOSIS — Z79899 Other long term (current) drug therapy: Secondary | ICD-10-CM | POA: Diagnosis not present

## 2023-11-08 DIAGNOSIS — I10 Essential (primary) hypertension: Secondary | ICD-10-CM | POA: Insufficient documentation

## 2023-11-08 DIAGNOSIS — R600 Localized edema: Secondary | ICD-10-CM | POA: Insufficient documentation

## 2023-11-08 DIAGNOSIS — R002 Palpitations: Secondary | ICD-10-CM | POA: Insufficient documentation

## 2023-11-08 MED ORDER — NITROGLYCERIN 0.4 MG SL SUBL
SUBLINGUAL_TABLET | SUBLINGUAL | 3 refills | Status: AC
Start: 2023-11-08 — End: ?

## 2023-11-08 MED ORDER — CARVEDILOL 6.25 MG PO TABS: 6.2500 mg | ORAL_TABLET | Freq: Two times a day (BID) | ORAL | 2 refills | Status: AC

## 2023-11-08 NOTE — Patient Instructions (Signed)
 Medication Instructions:  No Changes *If you need a refill on your cardiac medications before your next appointment, please call your pharmacy*  Lab Work: None  Testing/Procedures: Your physician has requested that you have an echocardiogram. Echocardiography is a painless test that uses sound waves to create images of your heart. It provides your doctor with information about the size and shape of your heart and how well your heart's chambers and valves are working. This procedure takes approximately one hour. There are no restrictions for this procedure. Please do NOT wear cologne, perfume, aftershave, or lotions (deodorant is allowed). Please arrive 15 minutes prior to your appointment time.   Follow-Up: At [facility], you and your health needs are our priority.  As part of our continuing mission to provide you with exceptional heart care, our providers are all part of one team.  This team includes your primary Cardiologist (physician) and Advanced Practice Providers or APPs (Physician Assistants and Nurse Practitioners) who all work together to provide you with the care you need, when you need it.  Your next appointment:   6 month(s)  Provider:   Gayatri A Acharya, MD    Other Instructions Please call us  or send a MyChart message with any Cardiology related questions/concerns.  [phone].  Thank you!          Valet parking services will be available as well.

## 2023-11-08 NOTE — Progress Notes (Signed)
 Cardiology Office Note:  .   Date:  11/08/2023  ID:  Rebecca [name], DOB 1946-04-08, MRN [id number] PCP: No primary care provider on file.  [facility] Providers Cardiologist:  Euell Herrlich, MD Cardiology APP:  Flo Hummingbird, PA-C    History of Present Illness: .   Rebecca [name] is a 78 y.o. female.  Discussed the use of AI scribe software for clinical note transcription with the patient, who gave verbal consent to proceed.  History of Present Illness The patient has a history of CAD status post PCI to the RCA in 2010, hyperlipidemia, hypertension, and chronic diastolic heart failure. The last cardiac catheterization was in 2018, which showed a patent RCA stent and mild disease otherwise. The most recent echocardiogram in 2022 was grossly normal. The patient was last seen in our office in July 2024, at which time there was no recurrence of chest pain or shortness of breath. The patient is currently on atorvastatin  80 mg daily for CAD and hyperlipidemia, carvedilol  6.25 mg twice daily for CAD and hypertension, Jardiance  25 mg daily, and Lasix  20 mg twice daily. The patient also has a prescription for as needed propranolol  10 mg twice daily. Additionally, the patient is on potassium 20 mEq twice daily for a total dose of 40 mEq and spironolactone  12.5 mg daily.  Palpitations continue, prior monitor 2024 showed SVT brief. Propranolol  helping taking more frequently. Feels stress playing a roll in symptoms. Cares for her husband who has been in an accident and has dementia.  Fluid accumulation despite diuretic, worsening over last several months. Feels diet may be playing a roll.   TSH normal, endorses fatigue and daytime sleepiness.  Sleeps in recliner because of back pain. Used to snore but sleeps upright. On a B12 oral supplement not much difference after 2 months.   Checked for sleep apnea borderline in past.   ROS: negative except per HPI above.  Studies Reviewed: Aaron Aas    EKG Interpretation Date/Time:  Thursday November 08 2023 08:33:42 EDT Ventricular Rate:  67 PR Interval:  170 QRS Duration:  86 QT Interval:  392 QTC Calculation: 414 R Axis:   5  Text Interpretation: Normal sinus rhythm Cannot rule out Anterior infarct (cited on or before 08-Apr-2021) When compared with ECG of 08-Apr-2021 11:58, No significant change was found Confirmed by Rebecca [name] ([id number]) on 11/08/2023 8:49:59 AM    Results DIAGNOSTIC Cardiac catheterization: Patent RCA stent and mild disease otherwise (2018) Echocardiogram: Grossly normal (2022) Risk Assessment/Calculations:          Physical Exam:   VS:  BP 122/80   Pulse 67   Ht 4\' 10"  (1.473 m)   Wt 137 lb (62.1 kg)   SpO2 98%   BMI 28.63 kg/m    Wt Readings from Last 3 Encounters:  11/08/23 137 lb (62.1 kg)  05/01/23 136 lb (61.7 kg)  01/31/23 136 lb 0.4 oz (61.7 kg)     Physical Exam Constitutional: No acute distress Eyes: sclera non-icteric, normal conjunctiva and lids ENMT: normal dentition, moist mucous membranes Cardiovascular: regular rhythm, normal rate, no murmur. S1 and S2 normal. No jugular venous distention.  Respiratory: clear to auscultation bilaterally GI : normal bowel sounds, soft and nontender. No distention.   MSK: extremities warm, well perfused. No edema.  NEURO: grossly nonfocal exam, moves all extremities. PSYCH: alert and oriented x 3, normal mood and affect.     ASSESSMENT AND PLAN: .    Assessment and Plan  Assessment & Plan Chronic diastolic heart failure Med managment - increased LE swelling. Will repeat echo to ensure no changes in systolic or diastolic function. - continue carvedilol  6.25 mg BID, refill today for 1 year. - continue lasix  20 mg BID with potassium 20 meq BID. Can take additional lasix  with additional potassium once more during the day prn swelling. -recommend more frequent use of compression socks and reducing salt intake. -Continue spironolactone  12.5 mg  daily  Hypertension Well-controlled with current medications. - Continue carvedilol  and propranolol  as needed. Increased BB use may also contribute to fatigue.   CAD status post PCI to RCA Hyperlipidemia - Continue atorvastatin  80 mg daily.  LDL well-managed 67, triglycerides 102 labs from June 2024 -Continue carvedilol  6.25 mg twice daily as noted above -On Jardiance  for diabetes 25 mg daily  Palpitations Fatigue -Palpitations have not necessarily increased in frequency but she does note she is taking more propranolol  as needed.  We discussed cardiac monitor and she would like to defer at this time.  We discussed some screening laboratory studies including B12 and vitamin D  which she can pursue with her primary care doctor.  She has been screened for sleep apnea in the past and it was felt to be borderline and now she is sleeping positionally which limits snoring.  Stress may be a strong contributor to her daytime fatigue and poor sleep, we discussed some stress management techniques -Patient notes increased hunger and fatigue, we discussed adequate-protein diet to ensure more steady blood sugar control throughout the day particularly if stress is prominent in her life. -For palpitations, continue beta-blockade with carvedilol  scheduled and propranolol  as needed.  Increased beta-blocker use may also contribute to fatigue and we have discussed this today.      Rebecca Lawman, MD, [facility]  I spent 40 minutes in the care of THUYTIEN SCIORTINO today including reviewing labs (10/03/22 lipids, tsh), reviewing outside labs from 6-01/2023 (01/03/23), reviewing studies (prior echo and cardiac cath), face to face time discussing treatment options (30 mins), and documenting in the encounter.

## 2023-11-15 DIAGNOSIS — I10 Essential (primary) hypertension: Secondary | ICD-10-CM | POA: Diagnosis not present

## 2023-11-15 DIAGNOSIS — R531 Weakness: Secondary | ICD-10-CM | POA: Diagnosis not present

## 2023-11-15 DIAGNOSIS — E039 Hypothyroidism, unspecified: Secondary | ICD-10-CM | POA: Diagnosis not present

## 2023-12-12 ENCOUNTER — Ambulatory Visit (HOSPITAL_COMMUNITY)

## 2024-01-23 ENCOUNTER — Other Ambulatory Visit (HOSPITAL_COMMUNITY)

## 2024-02-04 ENCOUNTER — Ambulatory Visit: Payer: Self-pay | Admitting: Internal Medicine

## 2024-02-04 ENCOUNTER — Ambulatory Visit (HOSPITAL_COMMUNITY)
Admission: RE | Admit: 2024-02-04 | Discharge: 2024-02-04 | Disposition: A | Discharge status: Home / Self Care | Source: Ambulatory Visit | Attending: Internal Medicine | Admitting: Internal Medicine

## 2024-02-04 DIAGNOSIS — R600 Localized edema: Secondary | ICD-10-CM | POA: Diagnosis not present

## 2024-02-04 DIAGNOSIS — I2510 Atherosclerotic heart disease of native coronary artery without angina pectoris: Secondary | ICD-10-CM | POA: Insufficient documentation

## 2024-02-04 DIAGNOSIS — R5383 Other fatigue: Secondary | ICD-10-CM | POA: Diagnosis not present

## 2024-02-04 LAB — ECHOCARDIOGRAM COMPLETE
Area-P 1/2: 3.17 cm2
S' Lateral: 2.8 cm

## 2024-02-06 NOTE — Telephone Encounter (Signed)
 Spoke to husband who asked for a call back in about 20-30 minutes as pt is out and about right now. Will attempt phone call before I leave today.   02/06/24 at 4:50 pm

## 2024-02-14 ENCOUNTER — Other Ambulatory Visit: Payer: Self-pay

## 2024-02-14 MED ORDER — SPIRONOLACTONE 25 MG PO TABS: 12.5000 mg | ORAL_TABLET | Freq: Every day | ORAL | 2 refills | Status: AC

## 2024-03-06 DIAGNOSIS — H04123 Dry eye syndrome of bilateral lacrimal glands: Secondary | ICD-10-CM | POA: Diagnosis not present

## 2024-03-06 DIAGNOSIS — H524 Presbyopia: Secondary | ICD-10-CM | POA: Diagnosis not present

## 2024-03-06 DIAGNOSIS — E119 Type 2 diabetes mellitus without complications: Secondary | ICD-10-CM | POA: Diagnosis not present

## 2024-03-20 DIAGNOSIS — I10 Essential (primary) hypertension: Secondary | ICD-10-CM | POA: Diagnosis not present

## 2024-03-20 DIAGNOSIS — R262 Difficulty in walking, not elsewhere classified: Secondary | ICD-10-CM | POA: Diagnosis not present

## 2024-03-20 DIAGNOSIS — M542 Cervicalgia: Secondary | ICD-10-CM | POA: Diagnosis not present

## 2024-03-20 DIAGNOSIS — R7301 Impaired fasting glucose: Secondary | ICD-10-CM | POA: Diagnosis not present

## 2024-03-20 DIAGNOSIS — E559 Vitamin D deficiency, unspecified: Secondary | ICD-10-CM | POA: Diagnosis not present

## 2024-03-20 DIAGNOSIS — M6281 Muscle weakness (generalized): Secondary | ICD-10-CM | POA: Diagnosis not present

## 2024-03-20 DIAGNOSIS — M5416 Radiculopathy, lumbar region: Secondary | ICD-10-CM | POA: Diagnosis not present

## 2024-03-20 DIAGNOSIS — R531 Weakness: Secondary | ICD-10-CM | POA: Diagnosis not present

## 2024-03-20 DIAGNOSIS — E785 Hyperlipidemia, unspecified: Secondary | ICD-10-CM | POA: Diagnosis not present

## 2024-03-26 DIAGNOSIS — Z01419 Encounter for gynecological examination (general) (routine) without abnormal findings: Secondary | ICD-10-CM | POA: Diagnosis not present

## 2024-03-26 DIAGNOSIS — J300 Vasomotor rhinitis: Secondary | ICD-10-CM | POA: Diagnosis not present

## 2024-03-26 DIAGNOSIS — E663 Overweight: Secondary | ICD-10-CM | POA: Diagnosis not present

## 2024-03-26 DIAGNOSIS — I2510 Atherosclerotic heart disease of native coronary artery without angina pectoris: Secondary | ICD-10-CM | POA: Diagnosis not present

## 2024-03-26 DIAGNOSIS — J069 Acute upper respiratory infection, unspecified: Secondary | ICD-10-CM | POA: Diagnosis not present

## 2024-03-26 DIAGNOSIS — Z6828 Body mass index (BMI) 28.0-28.9, adult: Secondary | ICD-10-CM | POA: Diagnosis not present

## 2024-03-26 DIAGNOSIS — M519 Unspecified thoracic, thoracolumbar and lumbosacral intervertebral disc disorder: Secondary | ICD-10-CM | POA: Diagnosis not present

## 2024-03-26 DIAGNOSIS — J683 Other acute and subacute respiratory conditions due to chemicals, gases, fumes and vapors: Secondary | ICD-10-CM | POA: Diagnosis not present

## 2024-03-26 DIAGNOSIS — Z7189 Other specified counseling: Secondary | ICD-10-CM | POA: Diagnosis not present

## 2024-03-26 DIAGNOSIS — F39 Unspecified mood [affective] disorder: Secondary | ICD-10-CM | POA: Diagnosis not present

## 2024-03-26 DIAGNOSIS — M810 Age-related osteoporosis without current pathological fracture: Secondary | ICD-10-CM | POA: Diagnosis not present

## 2024-03-26 DIAGNOSIS — E039 Hypothyroidism, unspecified: Secondary | ICD-10-CM | POA: Diagnosis not present

## 2024-04-08 DIAGNOSIS — J683 Other acute and subacute respiratory conditions due to chemicals, gases, fumes and vapors: Secondary | ICD-10-CM | POA: Diagnosis not present

## 2024-04-24 DIAGNOSIS — J683 Other acute and subacute respiratory conditions due to chemicals, gases, fumes and vapors: Secondary | ICD-10-CM | POA: Diagnosis not present

## 2024-04-25 ENCOUNTER — Ambulatory Visit: Attending: Internal Medicine | Admitting: Internal Medicine

## 2024-04-25 ENCOUNTER — Encounter: Payer: Self-pay | Admitting: Internal Medicine

## 2024-04-25 VITALS — BP 99/62 | HR 76 | Ht <= 58 in | Wt 128.0 lb

## 2024-04-25 DIAGNOSIS — R609 Edema, unspecified: Secondary | ICD-10-CM | POA: Diagnosis not present

## 2024-04-25 DIAGNOSIS — I10 Essential (primary) hypertension: Secondary | ICD-10-CM | POA: Insufficient documentation

## 2024-04-25 DIAGNOSIS — E785 Hyperlipidemia, unspecified: Secondary | ICD-10-CM | POA: Insufficient documentation

## 2024-04-25 DIAGNOSIS — I2510 Atherosclerotic heart disease of native coronary artery without angina pectoris: Secondary | ICD-10-CM | POA: Insufficient documentation

## 2024-04-25 DIAGNOSIS — I5032 Chronic diastolic (congestive) heart failure: Secondary | ICD-10-CM | POA: Insufficient documentation

## 2024-04-25 DIAGNOSIS — I471 Supraventricular tachycardia, unspecified: Secondary | ICD-10-CM | POA: Diagnosis not present

## 2024-04-25 DIAGNOSIS — R002 Palpitations: Secondary | ICD-10-CM | POA: Insufficient documentation

## 2024-04-25 DIAGNOSIS — E119 Type 2 diabetes mellitus without complications: Secondary | ICD-10-CM | POA: Insufficient documentation

## 2024-04-25 DIAGNOSIS — Z23 Encounter for immunization: Secondary | ICD-10-CM | POA: Diagnosis not present

## 2024-04-25 NOTE — Progress Notes (Signed)
 Cardiology Office Note:  .   Date:  04/25/2024  ID:  Rebecca [name], DOB [birth year], MRN [id number] PCP: Rebecca Devoria LABOR, MD  [facility] Providers Cardiologist:  Rebecca [name] Merck, MD Cardiology APP:  Rebecca Olivia HERO, PA-C    History of Present Illness: .   [name] is a 78 y.o. female. Retired [employer] fundraiser.  Discussed the use of AI scribe software for clinical note transcription with the patient, who gave verbal consent to proceed.  History of Present Illness [name] is an 78 year old female with coronary artery disease, hypertension, and chronic diastolic heart failure who presents for follow-up.  She underwent percutaneous coronary intervention to the right coronary artery in 2010. A cardiac catheterization in 2018 showed a patent RCA stent with mild disease elsewhere. Her most recent echocardiogram in 2025 showed a left ventricular ejection fraction of 55-60% and grade one diastolic dysfunction. She is on atorvastatin  80 mg daily for secondary prevention of CAD and hyperlipidemia, and carvedilol  6.25 mg twice daily for CAD and hypertension. No recent chest pain is noted.  Chronic diastolic heart failure is managed with Jardiance  25 mg daily and Lasix  20 mg twice daily. She also takes spironolactone  12.5 mg daily and propranolol  10 mg as needed, which she uses two to three times a week for palpitations. Propranolol  helps with her palpitations, which have increased in frequency over the past year, leading to more frequent use since the last visit. She notes improvement in fluid accumulation with compression stockings and reports that her shortness of breath has improved.  Her blood pressure is usually around 120/80 mmHg, but it was low today, possibly due to dehydration from poor nutrition while being sick with a persistent URI. Her cholesterol is under good control.    ROS: negative except per HPI above.  Studies Reviewed: .         Results DIAGNOSTIC Echocardiogram: LVEF 55-60%, grade 1 diastolic dysfunction, overall reassuring findings (2025) EKG: Minor abnormality, not significant on echocardiogram Risk Assessment/Calculations:       Physical Exam:   VS:  BP 99/62 (BP Location: Left Arm, Patient Position: Sitting, Cuff Size: Normal)   Pulse 76   Ht 4' 10 (1.473 m)   Wt 128 lb (58.1 kg)   SpO2 95%   BMI 26.75 kg/m    Wt Readings from Last 3 Encounters:  04/25/24 128 lb (58.1 kg)  11/08/23 137 lb (62.1 kg)  05/01/23 136 lb (61.7 kg)     Physical Exam GENERAL: Alert, cooperative, well developed, no acute distress. HEENT: Normocephalic, normal oropharynx, moist mucous membranes. CHEST: Faint wheeze in left lower lung. CARDIOVASCULAR: Normal heart rate and rhythm, S1 and S2 normal without murmurs. ABDOMEN: Soft, non-tender, non-distended, without organomegaly, normal bowel sounds. EXTREMITIES: No cyanosis or edema. NEUROLOGICAL: Cranial nerves grossly intact, moves all extremities without gross motor or sensory deficit.   ASSESSMENT AND PLAN: .    Assessment and Plan Assessment & Plan Chronic diastolic heart failure Chronic diastolic heart failure with LVEF 55-60% and grade one diastolic dysfunction. Fluid status improved with compression stockings. Blood pressure low, likely due to dehydration and prednisone . - Continue Jardiance  25 mg daily. - Continue Lasix  20 mg twice daily, continues on potassium 40 meQ daily.  - Continue spironolactone  12.5 mg daily. - Encourage hydration and consider electrolyte drinks. - Monitor blood pressure and adjust Lasix  if necessary.  Coronary artery disease status post PCI to RCA Coronary artery disease post PCI to RCA in 2010  with stable cardiac function. No recent chest pain. Propranolol  effective for palpitations. - Continue atorvastatin  80 mg daily. - Continue carvedilol  6.25 mg twice daily. - Continue nitroglycerin  as needed. - Continue propranolol  10 mg  as needed, 2-3 times a week.  Paroxysmal supraventricular tachycardia Palpitations Paroxysmal supraventricular tachycardia with brief episodes. Propranolol  effective. - Continue propranolol  10 mg as needed, 2-3 times a week. - Monitor frequency of propranolol  use and adjust if needed.  Hypertension Blood pressure low, likely due to dehydration. - Continue coreg  6.25 mg bid, furosemide  20 mg bid, prn propranolol , spironolactone  12.5 mg daily. - Encourage hydration to maintain blood pressure.  Hyperlipidemia Hyperlipidemia well-controlled with atorvastatin . Cholesterol under good control. - Continue atorvastatin  80 mg daily.  Type 2 diabetes mellitus A1c within acceptable range. - Continue Jardiance  25 mg daily.  Chronic kidney disease, borderline function Chronic kidney disease with borderline function. Ibuprofen discontinued to prevent further damage. - Avoiding NSAIDs like ibuprofen.        Rebecca Merck, MD, FACC

## 2024-04-25 NOTE — Patient Instructions (Signed)
 Medication Instructions:  Your physician recommends that you continue on your current medications as directed. Please refer to the Current Medication list given to you today.  *If you need a refill on your cardiac medications before your next appointment, please call your pharmacy*  Lab Work: None ordered  If you have labs (blood work) drawn today and your tests are completely normal, you will receive your results only by: MyChart Message (if you have MyChart) OR A paper copy in the mail If you have any lab test that is abnormal or we need to change your treatment, we will call you to review the results.  Testing/Procedures: None ordered  Follow-Up: At [facility], you and your health needs are our priority.  As part of our continuing mission to provide you with exceptional heart care, our providers are all part of one team.  This team includes your primary Cardiologist (physician) and Advanced Practice Providers or APPs (Physician Assistants and Nurse Practitioners) who all work together to provide you with the care you need, when you need it.  Your next appointment:   6 month(s)  Provider:   Gayatri A Acharya, MD    We recommend signing up for the patient portal called MyChart.  Sign up information is provided on this After Visit Summary.  MyChart is used to connect with patients for Virtual Visits (Telemedicine).  Patients are able to view lab/test results, encounter notes, upcoming appointments, etc.  Non-urgent messages can be sent to your provider as well.   To learn more about what you can do with MyChart, go to [email or web address].   Other Instructions

## 2024-04-29 DIAGNOSIS — M810 Age-related osteoporosis without current pathological fracture: Secondary | ICD-10-CM | POA: Diagnosis not present

## 2024-04-29 DIAGNOSIS — J683 Other acute and subacute respiratory conditions due to chemicals, gases, fumes and vapors: Secondary | ICD-10-CM | POA: Diagnosis not present

## 2024-06-05 DIAGNOSIS — F4323 Adjustment disorder with mixed anxiety and depressed mood: Secondary | ICD-10-CM | POA: Diagnosis not present

## 2024-06-19 DIAGNOSIS — Z23 Encounter for immunization: Secondary | ICD-10-CM | POA: Diagnosis not present
